# Patient Record
Sex: Male | Born: 1950 | Race: White | Hispanic: No | Marital: Single | Smoking: Current every day smoker
Health system: Southern US, Community
[De-identification: ages and names within clinical notes are randomized; demographics above are authoritative.]

## PROBLEM LIST (undated history)

## (undated) ENCOUNTER — Emergency Department (HOSPITAL_COMMUNITY): Admission: EM | Payer: Medicare HMO

## (undated) DIAGNOSIS — I509 Heart failure, unspecified: Secondary | ICD-10-CM

## (undated) DIAGNOSIS — I10 Essential (primary) hypertension: Secondary | ICD-10-CM

## (undated) DIAGNOSIS — I4891 Unspecified atrial fibrillation: Secondary | ICD-10-CM

## (undated) DIAGNOSIS — E119 Type 2 diabetes mellitus without complications: Secondary | ICD-10-CM

## (undated) HISTORY — PX: SHOULDER SURGERY: SHX246

## (undated) HISTORY — PX: CHOLECYSTECTOMY: SHX55

---

## 2018-10-28 DIAGNOSIS — I509 Heart failure, unspecified: Secondary | ICD-10-CM

## 2018-10-28 DIAGNOSIS — R079 Chest pain, unspecified: Secondary | ICD-10-CM

## 2018-10-28 DIAGNOSIS — I340 Nonrheumatic mitral (valve) insufficiency: Secondary | ICD-10-CM

## 2018-10-28 DIAGNOSIS — E1169 Type 2 diabetes mellitus with other specified complication: Secondary | ICD-10-CM

## 2018-10-28 DIAGNOSIS — I4891 Unspecified atrial fibrillation: Secondary | ICD-10-CM

## 2018-10-28 DIAGNOSIS — J441 Chronic obstructive pulmonary disease with (acute) exacerbation: Secondary | ICD-10-CM

## 2018-10-28 DIAGNOSIS — R0600 Dyspnea, unspecified: Secondary | ICD-10-CM

## 2018-10-28 DIAGNOSIS — N183 Chronic kidney disease, stage 3 (moderate): Secondary | ICD-10-CM

## 2018-10-28 DIAGNOSIS — I352 Nonrheumatic aortic (valve) stenosis with insufficiency: Secondary | ICD-10-CM | POA: Diagnosis not present

## 2018-10-28 DIAGNOSIS — I361 Nonrheumatic tricuspid (valve) insufficiency: Secondary | ICD-10-CM

## 2018-10-29 DIAGNOSIS — R0600 Dyspnea, unspecified: Secondary | ICD-10-CM | POA: Diagnosis not present

## 2018-10-29 DIAGNOSIS — J441 Chronic obstructive pulmonary disease with (acute) exacerbation: Secondary | ICD-10-CM | POA: Diagnosis not present

## 2018-10-29 DIAGNOSIS — I509 Heart failure, unspecified: Secondary | ICD-10-CM | POA: Diagnosis not present

## 2018-10-29 DIAGNOSIS — R079 Chest pain, unspecified: Secondary | ICD-10-CM

## 2018-10-30 DIAGNOSIS — I509 Heart failure, unspecified: Secondary | ICD-10-CM | POA: Diagnosis not present

## 2018-10-30 DIAGNOSIS — J441 Chronic obstructive pulmonary disease with (acute) exacerbation: Secondary | ICD-10-CM | POA: Diagnosis not present

## 2018-10-30 DIAGNOSIS — R0600 Dyspnea, unspecified: Secondary | ICD-10-CM | POA: Diagnosis not present

## 2018-10-30 DIAGNOSIS — R079 Chest pain, unspecified: Secondary | ICD-10-CM | POA: Diagnosis not present

## 2018-10-31 DIAGNOSIS — J441 Chronic obstructive pulmonary disease with (acute) exacerbation: Secondary | ICD-10-CM | POA: Diagnosis not present

## 2018-10-31 DIAGNOSIS — R0600 Dyspnea, unspecified: Secondary | ICD-10-CM | POA: Diagnosis not present

## 2018-10-31 DIAGNOSIS — R079 Chest pain, unspecified: Secondary | ICD-10-CM | POA: Diagnosis not present

## 2018-10-31 DIAGNOSIS — I509 Heart failure, unspecified: Secondary | ICD-10-CM | POA: Diagnosis not present

## 2019-03-25 DIAGNOSIS — I10 Essential (primary) hypertension: Secondary | ICD-10-CM

## 2019-03-25 DIAGNOSIS — I5033 Acute on chronic diastolic (congestive) heart failure: Secondary | ICD-10-CM

## 2019-03-25 DIAGNOSIS — R0602 Shortness of breath: Secondary | ICD-10-CM

## 2019-03-25 DIAGNOSIS — J9601 Acute respiratory failure with hypoxia: Secondary | ICD-10-CM

## 2019-03-25 DIAGNOSIS — I272 Pulmonary hypertension, unspecified: Secondary | ICD-10-CM

## 2019-03-25 DIAGNOSIS — J441 Chronic obstructive pulmonary disease with (acute) exacerbation: Secondary | ICD-10-CM

## 2019-03-25 DIAGNOSIS — N179 Acute kidney failure, unspecified: Secondary | ICD-10-CM

## 2019-03-26 DIAGNOSIS — J441 Chronic obstructive pulmonary disease with (acute) exacerbation: Secondary | ICD-10-CM | POA: Diagnosis not present

## 2019-03-26 DIAGNOSIS — I509 Heart failure, unspecified: Secondary | ICD-10-CM | POA: Diagnosis not present

## 2019-03-26 DIAGNOSIS — I4891 Unspecified atrial fibrillation: Secondary | ICD-10-CM | POA: Diagnosis not present

## 2019-03-26 DIAGNOSIS — I10 Essential (primary) hypertension: Secondary | ICD-10-CM | POA: Diagnosis not present

## 2019-03-26 DIAGNOSIS — R0602 Shortness of breath: Secondary | ICD-10-CM | POA: Diagnosis not present

## 2019-03-26 DIAGNOSIS — J9601 Acute respiratory failure with hypoxia: Secondary | ICD-10-CM | POA: Diagnosis not present

## 2019-03-26 DIAGNOSIS — D649 Anemia, unspecified: Secondary | ICD-10-CM

## 2019-03-27 DIAGNOSIS — I361 Nonrheumatic tricuspid (valve) insufficiency: Secondary | ICD-10-CM

## 2019-03-27 DIAGNOSIS — I10 Essential (primary) hypertension: Secondary | ICD-10-CM | POA: Diagnosis not present

## 2019-03-27 DIAGNOSIS — I4891 Unspecified atrial fibrillation: Secondary | ICD-10-CM | POA: Diagnosis not present

## 2019-03-27 DIAGNOSIS — R0602 Shortness of breath: Secondary | ICD-10-CM | POA: Diagnosis not present

## 2019-03-27 DIAGNOSIS — I352 Nonrheumatic aortic (valve) stenosis with insufficiency: Secondary | ICD-10-CM | POA: Diagnosis not present

## 2019-03-27 DIAGNOSIS — J9601 Acute respiratory failure with hypoxia: Secondary | ICD-10-CM | POA: Diagnosis not present

## 2019-03-27 DIAGNOSIS — J441 Chronic obstructive pulmonary disease with (acute) exacerbation: Secondary | ICD-10-CM | POA: Diagnosis not present

## 2019-03-27 DIAGNOSIS — I272 Pulmonary hypertension, unspecified: Secondary | ICD-10-CM

## 2019-03-27 DIAGNOSIS — I350 Nonrheumatic aortic (valve) stenosis: Secondary | ICD-10-CM | POA: Diagnosis not present

## 2019-03-27 DIAGNOSIS — I340 Nonrheumatic mitral (valve) insufficiency: Secondary | ICD-10-CM | POA: Diagnosis not present

## 2019-03-27 DIAGNOSIS — E1169 Type 2 diabetes mellitus with other specified complication: Secondary | ICD-10-CM

## 2019-03-27 DIAGNOSIS — I5030 Unspecified diastolic (congestive) heart failure: Secondary | ICD-10-CM

## 2019-03-28 DIAGNOSIS — J9601 Acute respiratory failure with hypoxia: Secondary | ICD-10-CM | POA: Diagnosis not present

## 2019-03-28 DIAGNOSIS — J441 Chronic obstructive pulmonary disease with (acute) exacerbation: Secondary | ICD-10-CM | POA: Diagnosis not present

## 2019-03-28 DIAGNOSIS — I10 Essential (primary) hypertension: Secondary | ICD-10-CM | POA: Diagnosis not present

## 2019-03-28 DIAGNOSIS — I4891 Unspecified atrial fibrillation: Secondary | ICD-10-CM | POA: Diagnosis not present

## 2019-03-28 DIAGNOSIS — I272 Pulmonary hypertension, unspecified: Secondary | ICD-10-CM | POA: Diagnosis not present

## 2019-03-28 DIAGNOSIS — I5030 Unspecified diastolic (congestive) heart failure: Secondary | ICD-10-CM | POA: Diagnosis not present

## 2019-03-28 DIAGNOSIS — R0602 Shortness of breath: Secondary | ICD-10-CM | POA: Diagnosis not present

## 2019-03-28 DIAGNOSIS — I350 Nonrheumatic aortic (valve) stenosis: Secondary | ICD-10-CM | POA: Diagnosis not present

## 2019-09-16 ENCOUNTER — Emergency Department (HOSPITAL_COMMUNITY): Payer: Medicare HMO

## 2019-09-16 ENCOUNTER — Other Ambulatory Visit: Payer: Self-pay

## 2019-09-16 ENCOUNTER — Encounter (HOSPITAL_COMMUNITY): Payer: Self-pay

## 2019-09-16 ENCOUNTER — Inpatient Hospital Stay (HOSPITAL_COMMUNITY)
Admission: EM | Admit: 2019-09-16 | Discharge: 2019-09-28 | DRG: 242 | Disposition: A | Discharge status: Home Health | Payer: Medicare HMO | Attending: Internal Medicine | Admitting: Internal Medicine

## 2019-09-16 ENCOUNTER — Inpatient Hospital Stay (HOSPITAL_COMMUNITY): Payer: Medicare HMO

## 2019-09-16 DIAGNOSIS — S81012A Laceration without foreign body, left knee, initial encounter: Secondary | ICD-10-CM

## 2019-09-16 DIAGNOSIS — Z950 Presence of cardiac pacemaker: Secondary | ICD-10-CM

## 2019-09-16 DIAGNOSIS — I5022 Chronic systolic (congestive) heart failure: Secondary | ICD-10-CM

## 2019-09-16 DIAGNOSIS — I480 Paroxysmal atrial fibrillation: Secondary | ICD-10-CM | POA: Diagnosis present

## 2019-09-16 DIAGNOSIS — I350 Nonrheumatic aortic (valve) stenosis: Secondary | ICD-10-CM

## 2019-09-16 DIAGNOSIS — R9431 Abnormal electrocardiogram [ECG] [EKG]: Secondary | ICD-10-CM

## 2019-09-16 DIAGNOSIS — W19XXXA Unspecified fall, initial encounter: Secondary | ICD-10-CM

## 2019-09-16 DIAGNOSIS — J90 Pleural effusion, not elsewhere classified: Secondary | ICD-10-CM

## 2019-09-16 DIAGNOSIS — N179 Acute kidney failure, unspecified: Secondary | ICD-10-CM

## 2019-09-16 DIAGNOSIS — I2510 Atherosclerotic heart disease of native coronary artery without angina pectoris: Secondary | ICD-10-CM

## 2019-09-16 DIAGNOSIS — Z955 Presence of coronary angioplasty implant and graft: Secondary | ICD-10-CM

## 2019-09-16 DIAGNOSIS — J441 Chronic obstructive pulmonary disease with (acute) exacerbation: Secondary | ICD-10-CM

## 2019-09-16 DIAGNOSIS — R001 Bradycardia, unspecified: Principal | ICD-10-CM | POA: Diagnosis present

## 2019-09-16 DIAGNOSIS — Z959 Presence of cardiac and vascular implant and graft, unspecified: Secondary | ICD-10-CM

## 2019-09-16 DIAGNOSIS — D696 Thrombocytopenia, unspecified: Secondary | ICD-10-CM | POA: Diagnosis present

## 2019-09-16 DIAGNOSIS — D62 Acute posthemorrhagic anemia: Secondary | ICD-10-CM | POA: Diagnosis present

## 2019-09-16 DIAGNOSIS — I4892 Unspecified atrial flutter: Secondary | ICD-10-CM | POA: Diagnosis present

## 2019-09-16 DIAGNOSIS — Z7901 Long term (current) use of anticoagulants: Secondary | ICD-10-CM

## 2019-09-16 DIAGNOSIS — Z23 Encounter for immunization: Secondary | ICD-10-CM

## 2019-09-16 DIAGNOSIS — Y9301 Activity, walking, marching and hiking: Secondary | ICD-10-CM | POA: Diagnosis present

## 2019-09-16 DIAGNOSIS — I4510 Unspecified right bundle-branch block: Secondary | ICD-10-CM | POA: Diagnosis present

## 2019-09-16 DIAGNOSIS — I4819 Other persistent atrial fibrillation: Secondary | ICD-10-CM | POA: Diagnosis not present

## 2019-09-16 DIAGNOSIS — Z7902 Long term (current) use of antithrombotics/antiplatelets: Secondary | ICD-10-CM

## 2019-09-16 DIAGNOSIS — Z20822 Contact with and (suspected) exposure to covid-19: Secondary | ICD-10-CM | POA: Diagnosis present

## 2019-09-16 DIAGNOSIS — Z79899 Other long term (current) drug therapy: Secondary | ICD-10-CM

## 2019-09-16 DIAGNOSIS — E1122 Type 2 diabetes mellitus with diabetic chronic kidney disease: Secondary | ICD-10-CM | POA: Diagnosis present

## 2019-09-16 DIAGNOSIS — I5043 Acute on chronic combined systolic (congestive) and diastolic (congestive) heart failure: Secondary | ICD-10-CM | POA: Diagnosis present

## 2019-09-16 DIAGNOSIS — S0081XA Abrasion of other part of head, initial encounter: Secondary | ICD-10-CM | POA: Diagnosis present

## 2019-09-16 DIAGNOSIS — E861 Hypovolemia: Secondary | ICD-10-CM | POA: Diagnosis present

## 2019-09-16 DIAGNOSIS — R011 Cardiac murmur, unspecified: Secondary | ICD-10-CM | POA: Diagnosis present

## 2019-09-16 DIAGNOSIS — Z8249 Family history of ischemic heart disease and other diseases of the circulatory system: Secondary | ICD-10-CM

## 2019-09-16 DIAGNOSIS — Z882 Allergy status to sulfonamides status: Secondary | ICD-10-CM

## 2019-09-16 DIAGNOSIS — I509 Heart failure, unspecified: Secondary | ICD-10-CM | POA: Diagnosis not present

## 2019-09-16 DIAGNOSIS — R062 Wheezing: Secondary | ICD-10-CM | POA: Diagnosis not present

## 2019-09-16 DIAGNOSIS — I130 Hypertensive heart and chronic kidney disease with heart failure and stage 1 through stage 4 chronic kidney disease, or unspecified chronic kidney disease: Secondary | ICD-10-CM | POA: Diagnosis present

## 2019-09-16 DIAGNOSIS — M25462 Effusion, left knee: Secondary | ICD-10-CM | POA: Diagnosis present

## 2019-09-16 DIAGNOSIS — I083 Combined rheumatic disorders of mitral, aortic and tricuspid valves: Secondary | ICD-10-CM | POA: Diagnosis present

## 2019-09-16 DIAGNOSIS — S5012XA Contusion of left forearm, initial encounter: Secondary | ICD-10-CM | POA: Diagnosis present

## 2019-09-16 DIAGNOSIS — G4733 Obstructive sleep apnea (adult) (pediatric): Secondary | ICD-10-CM | POA: Diagnosis present

## 2019-09-16 DIAGNOSIS — I4891 Unspecified atrial fibrillation: Secondary | ICD-10-CM | POA: Diagnosis not present

## 2019-09-16 DIAGNOSIS — Z6829 Body mass index (BMI) 29.0-29.9, adult: Secondary | ICD-10-CM

## 2019-09-16 DIAGNOSIS — S40012A Contusion of left shoulder, initial encounter: Secondary | ICD-10-CM | POA: Diagnosis present

## 2019-09-16 DIAGNOSIS — W010XXA Fall on same level from slipping, tripping and stumbling without subsequent striking against object, initial encounter: Secondary | ICD-10-CM | POA: Diagnosis present

## 2019-09-16 DIAGNOSIS — I495 Sick sinus syndrome: Secondary | ICD-10-CM | POA: Diagnosis present

## 2019-09-16 DIAGNOSIS — Z9181 History of falling: Secondary | ICD-10-CM

## 2019-09-16 DIAGNOSIS — N1832 Chronic kidney disease, stage 3b: Secondary | ICD-10-CM | POA: Diagnosis present

## 2019-09-16 DIAGNOSIS — R627 Adult failure to thrive: Secondary | ICD-10-CM | POA: Diagnosis present

## 2019-09-16 DIAGNOSIS — F17210 Nicotine dependence, cigarettes, uncomplicated: Secondary | ICD-10-CM | POA: Diagnosis present

## 2019-09-16 DIAGNOSIS — R42 Dizziness and giddiness: Secondary | ICD-10-CM | POA: Diagnosis present

## 2019-09-16 DIAGNOSIS — R55 Syncope and collapse: Secondary | ICD-10-CM | POA: Diagnosis not present

## 2019-09-16 DIAGNOSIS — E669 Obesity, unspecified: Secondary | ICD-10-CM | POA: Diagnosis present

## 2019-09-16 HISTORY — DX: Essential (primary) hypertension: I10

## 2019-09-16 HISTORY — DX: Heart failure, unspecified: I50.9

## 2019-09-16 HISTORY — DX: Unspecified atrial fibrillation: I48.91

## 2019-09-16 HISTORY — DX: Type 2 diabetes mellitus without complications: E11.9

## 2019-09-16 LAB — COMPREHENSIVE METABOLIC PANEL
ALT: 15 U/L (ref 0–44)
AST: 18 U/L (ref 15–41)
Albumin: 2.8 g/dL — ABNORMAL LOW (ref 3.5–5.0)
Alkaline Phosphatase: 110 U/L (ref 38–126)
Anion gap: 10 (ref 5–15)
BUN: 45 mg/dL — ABNORMAL HIGH (ref 8–23)
CO2: 24 mmol/L (ref 22–32)
Calcium: 8.9 mg/dL (ref 8.9–10.3)
Chloride: 103 mmol/L (ref 98–111)
Creatinine, Ser: 2.87 mg/dL — ABNORMAL HIGH (ref 0.61–1.24)
GFR calc Af Amer: 25 mL/min — ABNORMAL LOW (ref >60)
GFR calc non Af Amer: 22 mL/min — ABNORMAL LOW (ref >60)
Glucose, Bld: 134 mg/dL — ABNORMAL HIGH (ref 70–99)
Potassium: 4.7 mmol/L (ref 3.5–5.1)
Sodium: 137 mmol/L (ref 135–145)
Total Bilirubin: 1.1 mg/dL (ref 0.3–1.2)
Total Protein: 5.7 g/dL — ABNORMAL LOW (ref 6.5–8.1)

## 2019-09-16 LAB — CBC
HCT: 29.4 % — ABNORMAL LOW (ref 39.0–52.0)
Hemoglobin: 9.1 g/dL — ABNORMAL LOW (ref 13.0–17.0)
MCH: 29.5 pg (ref 26.0–34.0)
MCHC: 31 g/dL (ref 30.0–36.0)
MCV: 95.5 fL (ref 80.0–100.0)
Platelets: 120 10*3/uL — ABNORMAL LOW (ref 150–400)
RBC: 3.08 MIL/uL — ABNORMAL LOW (ref 4.22–5.81)
RDW: 22.5 % — ABNORMAL HIGH (ref 11.5–15.5)
WBC: 8.3 10*3/uL (ref 4.0–10.5)
nRBC: 0.4 % — ABNORMAL HIGH (ref 0.0–0.2)

## 2019-09-16 LAB — I-STAT CHEM 8, ED
BUN: 42 mg/dL — ABNORMAL HIGH (ref 8–23)
Calcium, Ion: 1.1 mmol/L — ABNORMAL LOW (ref 1.15–1.40)
Chloride: 104 mmol/L (ref 98–111)
Creatinine, Ser: 3 mg/dL — ABNORMAL HIGH (ref 0.61–1.24)
Glucose, Bld: 122 mg/dL — ABNORMAL HIGH (ref 70–99)
HCT: 29 % — ABNORMAL LOW (ref 39.0–52.0)
Hemoglobin: 9.9 g/dL — ABNORMAL LOW (ref 13.0–17.0)
Potassium: 4.4 mmol/L (ref 3.5–5.1)
Sodium: 136 mmol/L (ref 135–145)
TCO2: 28 mmol/L (ref 22–32)

## 2019-09-16 LAB — RESPIRATORY PANEL BY RT PCR (FLU A&B, COVID)
Influenza A by PCR: NEGATIVE
Influenza B by PCR: NEGATIVE
SARS Coronavirus 2 by RT PCR: NEGATIVE

## 2019-09-16 LAB — PROTIME-INR
INR: 2.4 — ABNORMAL HIGH (ref 0.8–1.2)
Prothrombin Time: 26.5 seconds — ABNORMAL HIGH (ref 11.4–15.2)

## 2019-09-16 LAB — LACTIC ACID, PLASMA: Lactic Acid, Venous: 1.4 mmol/L (ref 0.5–1.9)

## 2019-09-16 LAB — SAMPLE TO BLOOD BANK

## 2019-09-16 LAB — ETHANOL: Alcohol, Ethyl (B): <10 mg/dL (ref <10)

## 2019-09-16 MED ORDER — FENTANYL CITRATE (PF) 100 MCG/2ML IJ SOLN
50.0000 ug | Freq: Once | INTRAMUSCULAR | Status: AC
  Administered 2019-09-16: 50 ug via INTRAVENOUS
  Filled 2019-09-16: qty 2

## 2019-09-16 MED ORDER — LIDOCAINE-EPINEPHRINE (PF) 2 %-1:200000 IJ SOLN
20.0000 mL | Freq: Once | INTRAMUSCULAR | Status: AC
  Administered 2019-09-16: 20 mL
  Filled 2019-09-16: qty 20

## 2019-09-16 MED ORDER — SODIUM CHLORIDE 0.9 % IV BOLUS
1000.0000 mL | Freq: Once | INTRAVENOUS | Status: AC
  Administered 2019-09-16: 1000 mL via INTRAVENOUS

## 2019-09-16 MED ORDER — TETANUS-DIPHTH-ACELL PERTUSSIS 5-2.5-18.5 LF-MCG/0.5 IM SUSP
0.5000 mL | Freq: Once | INTRAMUSCULAR | Status: AC
  Administered 2019-09-16: 0.5 mL via INTRAMUSCULAR

## 2019-09-16 MED ORDER — CEFAZOLIN SODIUM-DEXTROSE 2-4 GM/100ML-% IV SOLN
2.0000 g | Freq: Once | INTRAVENOUS | Status: AC
  Administered 2019-09-16: 2 g via INTRAVENOUS

## 2019-09-16 MED ORDER — INSULIN ASPART 100 UNIT/ML ~~LOC~~ SOLN: 0.0000 [IU] | Freq: Every day | SUBCUTANEOUS | Status: DC

## 2019-09-16 MED ORDER — ACETAMINOPHEN 650 MG RE SUPP: 650.0000 mg | Freq: Four times a day (QID) | RECTAL | Status: DC | PRN

## 2019-09-16 MED ORDER — CEFAZOLIN SODIUM-DEXTROSE 2-4 GM/100ML-% IV SOLN
2.0000 g | Freq: Two times a day (BID) | INTRAVENOUS | Status: DC
  Administered 2019-09-17 – 2019-09-22 (×11): 2 g via INTRAVENOUS
  Filled 2019-09-16 (×12): qty 100

## 2019-09-16 MED ORDER — ACETAMINOPHEN 325 MG PO TABS
650.0000 mg | ORAL_TABLET | Freq: Four times a day (QID) | ORAL | Status: DC | PRN
  Administered 2019-09-22 – 2019-09-23 (×2): 650 mg via ORAL
  Filled 2019-09-16 (×2): qty 2

## 2019-09-16 MED ORDER — LIDOCAINE-EPINEPHRINE (PF) 2 %-1:200000 IJ SOLN
INTRAMUSCULAR | Status: AC
  Administered 2019-09-16: 20 mL via INTRADERMAL
  Filled 2019-09-16: qty 20

## 2019-09-16 MED ORDER — LIDOCAINE-EPINEPHRINE (PF) 2 %-1:200000 IJ SOLN: 20.0000 mL | Freq: Once | INTRAMUSCULAR | Status: AC

## 2019-09-16 MED ORDER — INSULIN ASPART 100 UNIT/ML ~~LOC~~ SOLN
0.0000 [IU] | Freq: Three times a day (TID) | SUBCUTANEOUS | Status: DC
  Administered 2019-09-18 (×2): 2 [IU] via SUBCUTANEOUS
  Administered 2019-09-18 – 2019-09-19 (×2): 1 [IU] via SUBCUTANEOUS
  Administered 2019-09-19 (×2): 2 [IU] via SUBCUTANEOUS
  Administered 2019-09-20 (×2): 1 [IU] via SUBCUTANEOUS
  Administered 2019-09-20 – 2019-09-21 (×2): 2 [IU] via SUBCUTANEOUS
  Administered 2019-09-21: 1 [IU] via SUBCUTANEOUS
  Administered 2019-09-21: 2 [IU] via SUBCUTANEOUS
  Administered 2019-09-22: 1 [IU] via SUBCUTANEOUS
  Administered 2019-09-22: 2 [IU] via SUBCUTANEOUS
  Administered 2019-09-23 – 2019-09-24 (×4): 1 [IU] via SUBCUTANEOUS
  Administered 2019-09-25: 2 [IU] via SUBCUTANEOUS
  Administered 2019-09-25: 1 [IU] via SUBCUTANEOUS
  Administered 2019-09-26 – 2019-09-27 (×2): 2 [IU] via SUBCUTANEOUS
  Administered 2019-09-28 (×2): 1 [IU] via SUBCUTANEOUS

## 2019-09-16 NOTE — ED Notes (Signed)
Pt's HR dropping to mid to high 30s on monitor. Pt states HR normally in mid 40s because he needs his heart valves replaced.  Pt also c/o increased pain to L knee. Dr. Darl Householder notified.

## 2019-09-16 NOTE — ED Notes (Signed)
Admitting MD at bedside.

## 2019-09-16 NOTE — ED Notes (Signed)
Update son, [name], when pt gets room. [phone]

## 2019-09-16 NOTE — ED Notes (Signed)
Dr. Darl Householder at bedside to suture L knee

## 2019-09-16 NOTE — ED Notes (Signed)
Pt back from CT

## 2019-09-16 NOTE — Progress Notes (Signed)
Pharmacy Antibiotic Note  Justin Boone is a 69 y.o. male admitted on 09/16/2019 with cellulitis - s/p fall.  Pharmacy has been consulted for Cefazolin dosing.  Plan: Ancef 2gm IV q12h Will f/u renal function, micro data, and pt's clinical condition  Height: 6' (182.9 cm) Weight: 86.2 kg (190 lb) IBW/kg (Calculated) : 77.6  Temp (24hrs), Avg:98.5 F (36.9 C), Min:98.5 F (36.9 C), Max:98.5 F (36.9 C)  Recent Labs  Lab 09/16/19 1817 09/16/19 1820 09/16/19 1826  WBC 8.3  --   --   CREATININE 2.87* 3.00*  --   LATICACIDVEN  --   --  1.4    Estimated Creatinine Clearance: 26.2 mL/min (A) (by C-G formula based on SCr of 3 mg/dL (H)).    Allergies  Allergen Reactions  . Sulfa Antibiotics     Antimicrobials this admission: 4/8 Ancef >>    Microbiology results: Pending  Thank you for allowing pharmacy to be a part of this patient's care.  Sherlon Handing, PharmD, BCPS Please see amion for complete clinical pharmacist phone list 09/16/2019 11:52 PM

## 2019-09-16 NOTE — ED Notes (Signed)
Assumed care of pt

## 2019-09-16 NOTE — ED Notes (Signed)
Repeat EKG completed

## 2019-09-16 NOTE — ED Provider Notes (Signed)
[facility] EMERGENCY DEPARTMENT Provider Note   CSN: [id number] Arrival date & time: 09/16/19  1810     History Chief Complaint  Patient presents with  . Fall    Justin Boone is a 69 y.o. male hx of afib on eliquis, CHF, here presenting with fall.  Patient states he felt lightheaded and dizzy earlier today and fell and injured the left forearm.  He states that he was able to get up afterwards and felt lightheaded dizzy again and fell and had a direct trauma to the left knee.  He also hit his head as well.  He is complaining of left shoulder pain as well.  Patient is on Eliquis for A. fib.  Denies any chest pain or abdominal pain.  EMS got there and had to place a tourniquet on the left knee because of bleeding.  The history is provided by the patient.       Past Medical History:  Diagnosis Date  . A-fib ([place])   . CHF (congestive heart failure) [facility])     Patient Active Problem List   Diagnosis Date Noted  . A-fib ([place])     No family history on file.  Social History   Tobacco Use  . Smoking status: Not on file  Substance Use Topics  . Alcohol use: Not on file  . Drug use: Not on file    Home Medications Prior to Admission medications   Not on File    Allergies    Sulfa antibiotics  Review of Systems   Review of Systems  Musculoskeletal: Positive for back pain.       L knee pain   Skin: Positive for wound.  All other systems reviewed and are negative.   Physical Exam Updated Vital Signs BP 110/74   Pulse (!) 51   Temp 98.5 F (36.9 C) (Oral)   Resp 19   Ht 6' (1.829 m)   Wt 86.2 kg   SpO2 95%   BMI 25.77 kg/m   Physical Exam Vitals and nursing note reviewed.  Constitutional:      Comments: Uncomfortable   HENT:     Head:     Comments: Abrasion of the forehead    Nose: Nose normal.     Mouth/Throat:     Mouth: Mucous membranes are moist.  Eyes:     Extraocular Movements: Extraocular movements intact.     Pupils: Pupils  are equal, round, and reactive to light.  Cardiovascular:     Rate and Rhythm: Normal rate and regular rhythm.     Pulses: Normal pulses.     Heart sounds: Normal heart sounds.  Pulmonary:     Effort: Pulmonary effort is normal.     Breath sounds: Normal breath sounds.  Abdominal:     General: Abdomen is flat.  Musculoskeletal:     Cervical back: Normal range of motion.     Comments: 20 cm left knee laceration with active bleeding.  Obvious left knee effusion and unable to range the knee.  Bruising of the left shoulder and left forearm. Pelvis stable, no midline spinal tenderness   Skin:    General: Skin is warm.     Capillary Refill: Capillary refill takes less than 2 seconds.  Neurological:     General: No focal deficit present.     Mental Status: He is oriented to person, place, and time.  Psychiatric:        Mood and Affect: Mood normal.  Behavior: Behavior normal.       ED Results / Procedures / Treatments   Labs (all labs ordered are listed, but only abnormal results are displayed) Labs Reviewed  COMPREHENSIVE METABOLIC PANEL - Abnormal; Notable for the following components:      Result Value   Glucose, Bld 134 (*)    BUN 45 (*)    Creatinine, Ser 2.87 (*)    Total Protein 5.7 (*)    Albumin 2.8 (*)    GFR calc non Af Amer 22 (*)    GFR calc Af Amer 25 (*)    All other components within normal limits  CBC - Abnormal; Notable for the following components:   RBC 3.08 (*)    Hemoglobin 9.1 (*)    HCT 29.4 (*)    RDW 22.5 (*)    Platelets 120 (*)    nRBC 0.4 (*)    All other components within normal limits  PROTIME-INR - Abnormal; Notable for the following components:   Prothrombin Time 26.5 (*)    INR 2.4 (*)    All other components within normal limits  I-STAT CHEM 8, ED - Abnormal; Notable for the following components:   BUN 42 (*)    Creatinine, Ser 3.00 (*)    Glucose, Bld 122 (*)    Calcium, Ion 1.10 (*)    Hemoglobin 9.9 (*)    HCT 29.0 (*)     All other components within normal limits  RESPIRATORY PANEL BY RT PCR (FLU A&B, COVID)  ETHANOL  LACTIC ACID, PLASMA  URINALYSIS, ROUTINE W REFLEX MICROSCOPIC  SAMPLE TO BLOOD BANK    EKG EKG Interpretation  Date/Time:  Thursday September 16 2019 20:31:14 EDT Ventricular Rate:  52 PR Interval:    QRS Duration: 159 QT Interval:  546 QTC Calculation: 508 R Axis:   158 Text Interpretation: Sinus or ectopic atrial bradycardia Sinus pause Right bundle branch block No significant change since last tracing Confirmed by Wandra Arthurs [phone]) on 09/16/2019 8:58:32 PM   Radiology DG Forearm Left  Result Date: 09/16/2019 CLINICAL DATA:  Status post fall. EXAM: LEFT FOREARM - 2 VIEW COMPARISON:  None. FINDINGS: There is no evidence of fracture or other focal bone lesions. Soft tissues are unremarkable. IMPRESSION: Negative. Electronically Signed   By: Virgina Norfolk M.D.   On: 09/16/2019 19:08   CT Head Wo Contrast  Result Date: 09/16/2019 CLINICAL DATA:  70 year old male with multiple falls today. Positive loss of consciousness. EXAM: CT HEAD WITHOUT CONTRAST TECHNIQUE: Contiguous axial images were obtained from the base of the skull through the vertex without intravenous contrast. COMPARISON:  [facility] 06/15/2007. FINDINGS: Brain: Cerebral volume is within normal limits for age. No midline shift, ventriculomegaly, mass effect, evidence of mass lesion, intracranial hemorrhage or evidence of cortically based acute infarction. Patchy and confluent bilateral cerebral white matter hypodensity is new since 2009, although fairly symmetric. The deep gray nuclei, brainstem and cerebellum remain within normal limits. No cortical encephalomalacia identified. Vascular: Calcified atherosclerosis at the skull base.No suspicious intracranial vascular hyperdensity. Skull: Intact, negative. Sinuses/Orbits: Visualized paranasal sinuses and mastoids are clear. Other: No acute orbit or scalp soft tissue  findings. Postoperative changes to both globes. IMPRESSION: 1. No acute intracranial abnormality or acute traumatic injury identified. 2. 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 Electronically Signed   By: Genevie Ann M.D.   On: 09/16/2019 19:32   CT Cervical Spine  Wo Contrast  Result Date: 09/16/2019 CLINICAL DATA:  69 year old male with multiple falls today. Positive loss of consciousness. EXAM: CT CERVICAL SPINE WITHOUT CONTRAST TECHNIQUE: Multidetector CT imaging of the cervical spine was performed without intravenous contrast. Multiplanar CT image reconstructions were also generated. COMPARISON:  Head CT today reported separately. [facility] cervical spine MRI 07/19/2014. Cervical spine radiographs 02/13/2018. FINDINGS: Alignment: Chronic straightening of cervical lordosis similar to the prior MRI. Mild chronic retrolisthesis at C6-C7. Bilateral posterior element alignment is within normal limits. Skull base and vertebrae: Motion artifact C5 through C7. Visualized skull base is intact. No atlanto-occipital dissociation. C1 and C2 appear intact and normally aligned. No acute osseous abnormality identified. Soft tissues and spinal canal: No prevertebral fluid or swelling. No visible canal hematoma. Bilateral calcified carotid atherosclerosis in the neck. Disc levels: Cervical spine degeneration appears progressed since the 2016 MRI, likely with new or increased upper cervical spine mild degenerative stenosis such as at C2-C3 and C3-C4 (sagittal image 55). Lower cervical spinal stenosis to severe now. At C6-C7 could be moderate Upper chest: Low-density at least moderate size layering pleural effusions in both lung apices. No pneumothorax. Visible upper thoracic levels appear grossly intact. Calcified aortic atherosclerosis. Other: Carious dentition. IMPRESSION: 1. No acute traumatic injury identified in the cervical spine. Mild  motion artifact C5 through C7. 2. 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 3. Bilateral layering pleural effusions visible in the lung apices, at least moderate. 4. Aortic Atherosclerosis (ICD10-I70.0). 5. Carious dentition. Electronically Signed   By: Genevie Ann M.D.   On: 09/16/2019 19:38   CT Knee Left Wo Contrast  Result Date: 09/16/2019 CLINICAL DATA:  Status post fall, question of tibial plateau fracture EXAM: CT OF THE left  KNEE WITHOUT CONTRAST TECHNIQUE: Multidetector CT imaging of the left knee was performed according to the standard protocol. Multiplanar CT image reconstructions were also generated. COMPARISON:  None. FINDINGS: Bones/Joint/Cartilage No definite fracture or dislocation is seen. 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 A tiny amount of suprapatellar fluid is seen. Ligaments Suboptimally assessed by CT. There does appear to be however thickening and edema seen around the lateral patellar retinaculum. Muscles and Tendons The muscles appear to be grossly intact. No focal atrophy or tear. The patellar and quadriceps tendon are intact. Soft tissues Significant overlying subcutaneous edema soft tissue swelling and small foci of subcutaneous emphysema are noted. Along the lateral patellar retinaculum there is a approximately 3 cm soft tissue hematoma. And along the proximal medial tibia there is a small 2 cm soft tissue hematoma. There is also overlying skin laceration seen. Scattered vascular calcifications are noted. IMPRESSION: No definite fracture seen. Edema and thickening seen around the lateral patellar retinaculum, likely due to intrasubstance sprain. Overlying prepatellar laceration with significant subcutaneous edema and small soft tissue hematomas as described above. Electronically Signed   By: Prudencio Pair M.D.   On: 09/16/2019  19:40   DG Pelvis Portable  Result Date: 09/16/2019 CLINICAL DATA:  Status post fall. EXAM: PORTABLE PELVIS 1-2 VIEWS COMPARISON:  None. FINDINGS: There is no evidence of an acute pelvic fracture or diastasis. Mild chronic changes are seen along the symphysis pubis. A total right hip replacement is noted. There is no evidence of surrounding lucency to suggest the presence of hardware loosening or infection. Soft tissue structures are unremarkable. IMPRESSION: Intact right hip replacement. Electronically Signed   By:  Virgina Norfolk M.D.   On: 09/16/2019 19:12   DG Chest Port 1 View  Result Date: 09/16/2019 CLINICAL DATA:  Status post fall with loss of consciousness. EXAM: PORTABLE CHEST 1 VIEW COMPARISON:  June 15, 2019 FINDINGS: Mild, stable diffusely increased lung markings are seen. Mild atelectasis and/or infiltrate is seen within the retrocardiac region of the left lung base. Small bilateral pleural effusions are noted. There is no evidence of a pneumothorax. The cardiac silhouette is mildly enlarged and unchanged in size. There is moderate severity calcification of the aortic arch. The visualized skeletal structures are unremarkable. IMPRESSION: 1. Mild left basilar atelectasis and/or infiltrate. 2. Small bilateral pleural effusions. Electronically Signed   By: Virgina Norfolk M.D.   On: 09/16/2019 19:07   DG Shoulder Left  Result Date: 09/16/2019 CLINICAL DATA:  Status post fall. EXAM: LEFT SHOULDER - 2+ VIEW COMPARISON:  None. FINDINGS: There is no evidence of acute fracture or dislocation. Moderate severity degenerative changes seen involving the distal left clavicle, left acromioclavicular joint and left acromion. Soft tissues are unremarkable. IMPRESSION: Degenerative changes, without evidence of acute osseous abnormality. Electronically Signed   By: Virgina Norfolk M.D.   On: 09/16/2019 19:14   DG Knee Left Port  Result Date: 09/16/2019 CLINICAL DATA:  Status post fall. EXAM: PORTABLE  LEFT KNEE - 1-2 VIEW COMPARISON:  None. FINDINGS: It should be noted that the medial and lateral epicondyles of the distal left femur are limited in evaluation on the frontal view secondary to the presence of an overlying radiopaque bandage. No evidence of acute fracture, dislocation, or joint effusion. No evidence of arthropathy or other focal bone abnormality. There is mild infrapatellar soft tissue swelling. A mild to moderate amount of soft tissue air is also seen within this region. IMPRESSION: 1. Mild to moderate amount of infrapatellar soft tissue swelling and soft tissue air without evidence of acute osseous abnormality. Electronically Signed   By: Virgina Norfolk M.D.   On: 09/16/2019 19:10   DG Tibia/Fibula Left Port  Result Date: 09/16/2019 CLINICAL DATA:  Fall EXAM: PORTABLE LEFT TIBIA AND FIBULA - 2 VIEW COMPARISON:  None. FINDINGS: Infrapatellar soft tissue swelling with soft tissue air again noted as seen on earlier knee series. No acute fracture, subluxation or dislocation. Old healed distal fibular shaft fracture. IMPRESSION: No acute bony abnormality. Electronically Signed   By: Rolm Baptise M.D.   On: 09/16/2019 19:32    Procedures Procedures (including critical care time)  LACERATION REPAIR Performed by: Wandra Arthurs Authorized by: Wandra Arthurs Consent: Verbal consent obtained. Risks and benefits: risks, benefits and alternatives were discussed Consent given by: patient Patient identity confirmed: provided demographic data Prepped and Draped in normal sterile fashion Wound explored  Laceration Location: L knee  Laceration Length:  20 cm  No Foreign Bodies seen or palpated, there is fat exposed  Anesthesia: local infiltration  Local anesthetic: lidocaine 2 % with epinephrine  Anesthetic total:30 ml  Irrigation method: syringe Amount of cleaning: standard  Skin closure:  Multi layer  Number of sutures: see below  Technique: Performed multilayer closure.  A  placed 4 3-0 Vicryl subcutaneous stitches to close the adipose tissue.  I then placed sixteen 3-0 Ethilon simple interrupted sutures to loosely approximate the wound.   Patient tolerance: Patient tolerated the procedure well with no immediate complications.   Medications Ordered in ED Medications  fentaNYL (SUBLIMAZE) injection 50 mcg (50 mcg Intravenous Given 09/16/19 1849)  sodium chloride 0.9 % bolus 1,000  mL (0 mLs Intravenous Stopped 09/16/19 1940)  Tdap (BOOSTRIX) injection 0.5 mL (0.5 mLs Intramuscular Given 09/16/19 1840)  ceFAZolin (ANCEF) IVPB 2g/100 mL premix (0 g Intravenous Stopped 09/16/19 1910)  fentaNYL (SUBLIMAZE) injection 50 mcg (50 mcg Intravenous Given 09/16/19 2019)  lidocaine-EPINEPHrine (XYLOCAINE W/EPI) 2 %-1:200000 (PF) injection 20 mL (20 mLs Infiltration Given by Other 09/16/19 2123)  lidocaine-EPINEPHrine (XYLOCAINE W/EPI) 2 %-1:200000 (PF) injection 20 mL (20 mLs Intradermal Given by Other 09/16/19 2122)    ED Course  I have reviewed the triage vital signs and the nursing notes.  Pertinent labs & imaging results that were available during my care of the patient were reviewed by me and considered in my medical decision making (see chart for details).    MDM Rules/Calculators/A&P                      Justin Boone is a 70 y.o. male here presenting with fall.  He is on Eliquis and did have a head injury and large laceration to the left knee.  Concern for open tibial plateau fracture.  He states that Dr. Tamala Julian operated on his right hip previously and he does want to stay in the practice.  Will check trauma labs as well as CT head and neck and x-rays.  If knee x-ray is negative will need CT knee.  10:20 PM Patient does go in and out of a junctional rhythm.  I talked to cardiologist Dr. Marletta Lor.  She states that patient is on Cardizem and metoprolol so recommend telemetry monitoring and medication adjustment.  His x-ray and CT scan showed no tibial plateau fracture. I talked to Dr.  Griffin Basil from orthopedics.  He recommend washing out the wound and keep patient on Keflex.  He also recommend loosely approximate the wound after watching him.  He states that patient can be discharged from a orthopedic standpoint.  However I told him that patient may need telemetry monitoring so he will see the patient in the morning.   Final Clinical Impression(s) / ED Diagnoses Final diagnoses:  Fall  Fall    Rx / DC Orders ED Discharge Orders    None       Drenda Freeze, MD 09/16/19 2220

## 2019-09-16 NOTE — ED Notes (Signed)
EKG completed.  Fentanyl given per MAR. Name/DOB verified with pt Covid swab collected, labeled with 2 pt identifiers, and brought to lab

## 2019-09-16 NOTE — ED Triage Notes (Signed)
Pt arrived with EMS, he fell twice at home today, It was on the second fall that he had positive LOC and sustained a laceration to the left thigh.

## 2019-09-16 NOTE — H&P (Signed)
History and Physical    Justin Boone OIN:[id number] DOB: [birth year] DOA: 09/16/2019  PCP: Patient, No Pcp Per Patient coming from: Home  Chief Complaint: Fall  HPI: Justin Boone is a 70 y.o. male with medical history significant of A. fib on Eliquis, CHF, diabetes, hypertension, COPD presenting to the ED for evaluation of head injury and a large laceration to the left knee after a fall.  Patient states he has been feeling very weak and dizzy this past week.  He has noticed that his heart "beats and then stops beating for a long time."  Today he fell twice and injured his left knee and left shoulder.  During the second fall he even hit his head and lost consciousness.  He takes metoprolol and Eliquis.  Not sure which other medications he takes as he does not have them with him at this time.  His cardiologist is outside of [place] and he cannot recall the name at this time.  States he has been told that he has problems with aorta and another heart valve.  Reports having chronic shortness of breath and cough secondary to COPD, no recent change.  Denies chest pain.  Denies fevers, nausea, vomiting, abdominal pain, or diarrhea.  ED Course: Afebrile.  Bradycardic with junctional rhythm.  Heart rate ranging from 30s to 50s.  Not hypotensive.  Labs show no leukocytosis.  Lactic acid normal.  Hemoglobin 9.1, MCV 95.  Platelet count 120.  BUN 45, creatinine 2.8.  No prior labs for comparison.  Blood ethanol level undetectable.  INR 2.4.  SARS-CoV-2 PCR test negative.  Chest x-ray showing mild left basilar atelectasis and/or infiltrate.  Small bilateral pleural effusions. X-ray of pelvis negative for hip fracture. X-ray of left forearm negative. X-ray of left knee with mild to moderate amount of infrapatellar soft tissue swelling and soft tissue air without evidence of acute osseous abnormality. X-ray of left tibia/fibula negative for acute bony abnormality. X-ray of left shoulder showing degenerative  changes without evidence of acute osseous abnormality. CT of left knee negative for fracture.  Showing edema and thickening around the lateral patellar retinaculum, likely due to intrasubstance sprain.  Overlying prepatellar laceration with significant subcutaneous edema and small soft tissue hematomas. CT head negative for acute intracranial abnormality or traumatic injury. CT C-spine negative for acute traumatic injury.  Showing chronic cervical spine degeneration which appears progressed since 2016 MRI with moderate to severe lower cervical spinal stenosis now possible at C6-C7.  Knee laceration was repaired in the ED.  Patient received Tdap booster.  Case was discussed with Dr. Griffin Basil who recommended placing the patient on cefazolin and orthopedic team will see the patient in the morning.  Patient received 1 L normal saline bolus.  Review of Systems:  All systems reviewed and apart from history of presenting illness, are negative.  Past Medical History:  Diagnosis Date  . A-fib ([place])   . CHF (congestive heart failure) ([place])     Past Surgical History:  Procedure Laterality Date  . SHOULDER SURGERY       reports that he has been smoking cigarettes. He has a 25.00 pack-year smoking history. He has never used smokeless tobacco. He reports that he does not drink alcohol or use drugs.  Allergies  Allergen Reactions  . Sulfa Antibiotics     History reviewed. No pertinent family history.  Prior to Admission medications   Not on File    Physical Exam: Vitals:   09/16/19 2300 09/16/19 2330 09/16/19 2345 09/17/19 0000  BP: 127/71 108/66 115/63 (!) 106/59  Pulse: (!) 51 (!) 44 (!) 42 (!) 41  Resp:  (!) 22 19 18   Temp:      TempSrc:      SpO2: 93% 94% 90% 96%  Weight:      Height:        Physical Exam  Constitutional: He is oriented to person, place, and time. He appears well-developed and well-nourished. No distress.  HENT:  Head: Normocephalic.  Eyes: Right eye exhibits  no discharge. Left eye exhibits no discharge.  Cardiovascular: Normal rate, regular rhythm and intact distal pulses.  Pulmonary/Chest: Effort normal. He has no rales.  Rhonchi  Abdominal: Soft. Bowel sounds are normal. He exhibits no distension. There is no abdominal tenderness. There is no guarding.  Musculoskeletal:        General: Edema present.     Cervical back: Neck supple.     Comments: +3 pitting edema of bilateral lower extremities Left knee: Bandage in place Left elbow abrasion Left shoulder abrasion Bruising noted on left shin  Neurological: He is alert and oriented to person, place, and time.  Skin: Skin is warm and dry. He is not diaphoretic.       Labs on Admission: I have personally reviewed following labs and imaging studies  CBC: Recent Labs  Lab 09/16/19 1817 09/16/19 1820  WBC 8.3  --   HGB 9.1* 9.9*  HCT 29.4* 29.0*  MCV 95.5  --   PLT 120*  --    Basic Metabolic Panel: Recent Labs  Lab 09/16/19 1817 09/16/19 1820  NA 137 136  K 4.7 4.4  CL 103 104  CO2 24  --   GLUCOSE 134* 122*  BUN 45* 42*  CREATININE 2.87* 3.00*  CALCIUM 8.9  --    GFR: Estimated Creatinine Clearance: 26.2 mL/min (A) (by C-G formula based on SCr of 3 mg/dL (H)). Liver Function Tests: Recent Labs  Lab 09/16/19 1817  AST 18  ALT 15  ALKPHOS 110  BILITOT 1.1  PROT 5.7*  ALBUMIN 2.8*   No results for input(s): LIPASE, AMYLASE in the last 168 hours. No results for input(s): AMMONIA in the last 168 hours. Coagulation Profile: Recent Labs  Lab 09/16/19 1817  INR 2.4*   Cardiac Enzymes: No results for input(s): CKTOTAL, CKMB, CKMBINDEX, TROPONINI in the last 168 hours. BNP (last 3 results) No results for input(s): PROBNP in the last 8760 hours. HbA1C: No results for input(s): HGBA1C in the last 72 hours. CBG: No results for input(s): GLUCAP in the last 168 hours. Lipid Profile: No results for input(s): CHOL, HDL, LDLCALC, TRIG, CHOLHDL, LDLDIRECT in the last  72 hours. Thyroid Function Tests: No results for input(s): TSH, T4TOTAL, FREET4, T3FREE, THYROIDAB in the last 72 hours. Anemia Panel: Recent Labs    09/16/19 2352  RETICCTPCT 1.6   Urine analysis:    Component Value Date/Time   COLORURINE YELLOW 09/16/2019 2352   APPEARANCEUR CLEAR 09/16/2019 2352   LABSPEC 1.010 09/16/2019 2352   PHURINE 5.0 09/16/2019 2352   GLUCOSEU NEGATIVE 09/16/2019 2352   HGBUR NEGATIVE 09/16/2019 2352   BILIRUBINUR NEGATIVE 09/16/2019 2352   KETONESUR NEGATIVE 09/16/2019 2352   PROTEINUR NEGATIVE 09/16/2019 2352   NITRITE NEGATIVE 09/16/2019 2352   LEUKOCYTESUR NEGATIVE 09/16/2019 2352    Radiological Exams on Admission: DG Forearm Left  Result Date: 09/16/2019 CLINICAL DATA:  Status post fall. EXAM: LEFT FOREARM - 2 VIEW COMPARISON:  None. FINDINGS: There is no evidence of fracture or  other focal bone lesions. Soft tissues are unremarkable. IMPRESSION: Negative. Electronically Signed   By: Virgina Norfolk M.D.   On: 09/16/2019 19:08   CT Head Wo Contrast  Result Date: 09/16/2019 CLINICAL DATA:  69 year old male with multiple falls today. Positive loss of consciousness. EXAM: CT HEAD WITHOUT CONTRAST TECHNIQUE: Contiguous axial images were obtained from the base of the skull through the vertex without intravenous contrast. COMPARISON:  [facility] 06/15/2007. FINDINGS: Brain: Cerebral volume is within normal limits for age. No midline shift, ventriculomegaly, mass effect, evidence of mass lesion, intracranial hemorrhage or evidence of cortically based acute infarction. Patchy and confluent bilateral cerebral white matter hypodensity is new since 2009, although fairly symmetric. The deep gray nuclei, brainstem and cerebellum remain within normal limits. No cortical encephalomalacia identified. Vascular: Calcified atherosclerosis at the skull base.No suspicious intracranial vascular hyperdensity. Skull: Intact, negative. Sinuses/Orbits: Visualized  paranasal sinuses and mastoids are clear. Other: No acute orbit or scalp soft tissue findings. Postoperative changes to both globes. IMPRESSION: 1. No acute intracranial abnormality or acute traumatic injury identified. 2. 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 Electronically Signed   By: Genevie Ann M.D.   On: 09/16/2019 19:32   CT Cervical Spine Wo Contrast  Result Date: 09/16/2019 CLINICAL DATA:  69 year old male with multiple falls today. Positive loss of consciousness. EXAM: CT CERVICAL SPINE WITHOUT CONTRAST TECHNIQUE: Multidetector CT imaging of the cervical spine was performed without intravenous contrast. Multiplanar CT image reconstructions were also generated. COMPARISON:  Head CT today reported separately. [facility] cervical spine MRI 07/19/2014. Cervical spine radiographs 02/13/2018. FINDINGS: Alignment: Chronic straightening of cervical lordosis similar to the prior MRI. Mild chronic retrolisthesis at C6-C7. Bilateral posterior element alignment is within normal limits. Skull base and vertebrae: Motion artifact C5 through C7. Visualized skull base is intact. No atlanto-occipital dissociation. C1 and C2 appear intact and normally aligned. No acute osseous abnormality identified. Soft tissues and spinal canal: No prevertebral fluid or swelling. No visible canal hematoma. Bilateral calcified carotid atherosclerosis in the neck. Disc levels: Cervical spine degeneration appears progressed since the 2016 MRI, likely with new or increased upper cervical spine mild degenerative stenosis such as at C2-C3 and C3-C4 (sagittal image 55). Lower cervical spinal stenosis to severe now. At C6-C7 could be moderate Upper chest: Low-density at least moderate size layering pleural effusions in both lung apices. No pneumothorax. Visible upper thoracic levels appear grossly intact. Calcified aortic atherosclerosis. Other: Carious  dentition. IMPRESSION: 1. No acute traumatic injury identified in the cervical spine. Mild motion artifact C5 through C7. 2. 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 3. Bilateral layering pleural effusions visible in the lung apices, at least moderate. 4. Aortic Atherosclerosis (ICD10-I70.0). 5. Carious dentition. Electronically Signed   By: Genevie Ann M.D.   On: 09/16/2019 19:38   CT Knee Left Wo Contrast  Result Date: 09/16/2019 CLINICAL DATA:  Status post fall, question of tibial plateau fracture EXAM: CT OF THE left  KNEE WITHOUT CONTRAST TECHNIQUE: Multidetector CT imaging of the left knee was performed according to the standard protocol. Multiplanar CT image reconstructions were also generated. COMPARISON:  None. FINDINGS: Bones/Joint/Cartilage No definite fracture or dislocation is seen. 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 A tiny amount of suprapatellar fluid is seen. Ligaments Suboptimally assessed by CT. There does appear to be  however thickening and edema seen around the lateral patellar retinaculum. Muscles and Tendons The muscles appear to be grossly intact. No focal atrophy or tear. The patellar and quadriceps tendon are intact. Soft tissues Significant overlying subcutaneous edema soft tissue swelling and small foci of subcutaneous emphysema are noted. Along the lateral patellar retinaculum there is a approximately 3 cm soft tissue hematoma. And along the proximal medial tibia there is a small 2 cm soft tissue hematoma. There is also overlying skin laceration seen. Scattered vascular calcifications are noted. IMPRESSION: No definite fracture seen. Edema and thickening seen around the lateral patellar retinaculum, likely due to intrasubstance sprain. Overlying prepatellar laceration with significant subcutaneous edema and small soft tissue  hematomas as described above. Electronically Signed   By: Prudencio Pair M.D.   On: 09/16/2019 19:40   DG Pelvis Portable  Result Date: 09/16/2019 CLINICAL DATA:  Status post fall. EXAM: PORTABLE PELVIS 1-2 VIEWS COMPARISON:  None. FINDINGS: There is no evidence of an acute pelvic fracture or diastasis. Mild chronic changes are seen along the symphysis pubis. A total right hip replacement is noted. There is no evidence of surrounding lucency to suggest the presence of hardware loosening or infection. Soft tissue structures are unremarkable. IMPRESSION: Intact right hip replacement. Electronically Signed   By: Virgina Norfolk M.D.   On: 09/16/2019 19:12   DG Chest Port 1 View  Result Date: 09/16/2019 CLINICAL DATA:  Status post fall with loss of consciousness. EXAM: PORTABLE CHEST 1 VIEW COMPARISON:  June 15, 2019 FINDINGS: Mild, stable diffusely increased lung markings are seen. Mild atelectasis and/or infiltrate is seen within the retrocardiac region of the left lung base. Small bilateral pleural effusions are noted. There is no evidence of a pneumothorax. The cardiac silhouette is mildly enlarged and unchanged in size. There is moderate severity calcification of the aortic arch. The visualized skeletal structures are unremarkable. IMPRESSION: 1. Mild left basilar atelectasis and/or infiltrate. 2. Small bilateral pleural effusions. Electronically Signed   By: Virgina Norfolk M.D.   On: 09/16/2019 19:07   DG Shoulder Left  Result Date: 09/16/2019 CLINICAL DATA:  Status post fall. EXAM: LEFT SHOULDER - 2+ VIEW COMPARISON:  None. FINDINGS: There is no evidence of acute fracture or dislocation. Moderate severity degenerative changes seen involving the distal left clavicle, left acromioclavicular joint and left acromion. Soft tissues are unremarkable. IMPRESSION: Degenerative changes, without evidence of acute osseous abnormality. Electronically Signed   By: Virgina Norfolk M.D.   On: 09/16/2019 19:14    DG Knee Left Port  Result Date: 09/16/2019 CLINICAL DATA:  Status post fall. EXAM: PORTABLE LEFT KNEE - 1-2 VIEW COMPARISON:  None. FINDINGS: It should be noted that the medial and lateral epicondyles of the distal left femur are limited in evaluation on the frontal view secondary to the presence of an overlying radiopaque bandage. No evidence of acute fracture, dislocation, or joint effusion. No evidence of arthropathy or other focal bone abnormality. There is mild infrapatellar soft tissue swelling. A mild to moderate amount of soft tissue air is also seen within this region. IMPRESSION: 1. Mild to moderate amount of infrapatellar soft tissue swelling and soft tissue air without evidence of acute osseous abnormality. Electronically Signed   By: Virgina Norfolk M.D.   On: 09/16/2019 19:10   DG Tibia/Fibula Left Port  Result Date: 09/16/2019 CLINICAL DATA:  Fall EXAM: PORTABLE LEFT TIBIA AND FIBULA - 2 VIEW COMPARISON:  None. FINDINGS: Infrapatellar soft tissue swelling with soft tissue air again noted as  seen on earlier knee series. No acute fracture, subluxation or dislocation. Old healed distal fibular shaft fracture. IMPRESSION: No acute bony abnormality. Electronically Signed   By: Rolm Baptise M.D.   On: 09/16/2019 19:32    EKG: Independently reviewed.  Junctional bradycardia, heart rate 36.  RBBB and LPFB.  QTc 535.  No prior EKG for comparison.  Assessment/Plan Principal Problem:   Symptomatic bradycardia Active Problems:   A-fib (HCC)   QT prolongation   COPD with acute exacerbation (HCC)   AKI (acute kidney injury) (HCC)   Symptomatic bradycardia: Appears to be junctional bradycardia with heart rate in the 40s at present.  Hemodynamically stable.  Per review of care everywhere, patient is on both metoprolol and Cardizem.  He does not have his medications with him at this time and dose unknown.  Plan is to continue cardiac monitoring.  Hold AV nodal blocking agents until pharmacy med  rec is done.  Then adjust dose of home medications.  Order TSH.  Patient mentions history of valvular issues, will order echocardiogram.  Discussed with Dr. Marletta Lor from cardiology who is in agreement with this plan.  She will speak to daytime cardiology team and EP so that the patient can be seen in the morning.  If any signs of clinical instability overnight, atropine will be given and cardiology will see the patient.  COPD with acute exacerbation: Patient reports history of COPD.  Has diffuse rhonchi on exam and noted to be coughing aggressively.  Not hypoxic.  Start Solu-Medrol 60 mg every 6 hours, DuoNebs every 6 hours, albuterol nebulizer as needed, Pulmicort twice daily, and Mucinex DM.  Chest x-ray with question of mild left basilar atelectasis and/or infiltrate.  Pneumonia less likely given no fever or leukocytosis.  Check procalcitonin level.  Normocytic anemia: Hemoglobin 9.1, MCV 95.  No prior labs for comparison.  Order FOBT and anemia panel.  Mild thrombocytopenia: Continue to monitor.  AKI versus CKD: BUN 45, creatinine 2.8.  No prior labs for comparison.  Please obtain records from PCP in the morning. Will hold off giving fluid given history of CHF and signs of volume overload on exam.  Order renal ultrasound.  Monitor urine output.  Order urine sodium and creatinine.  Left knee laceration and sprain: CT of left knee negative for fracture.  Showing edema and thickening around the lateral patellar retinaculum, likely due to intrasubstance sprain.  Overlying prepatellar laceration with significant subcutaneous edema and small soft tissue hematomas. Repaired in the ED.  Patient received Tdap booster.  Continue cefazolin.  Orthopedic team will see the patient in the morning.  QT prolongation on EKG: Continue cardiac monitoring.  Keep potassium above 4 and magnesium above 2.  Avoid QT prolonging drugs if possible.  Repeat EKG in a.m.  Atrial fibrillation: Currently bradycardic.  Hold  beta-blocker and calcium channel blocker.  Hold home Eliquis to reduce risk of bleeding from large knee laceration.  CHF: Chest x-ray with small bilateral pleural effusions but no overt pulmonary edema.  Not hypoxic.  Does have peripheral edema on exam.  Echocardiogram has been ordered.  Diabetes mellitus: Unclear whether he is on insulin or oral hypoglycemic agents.  Check A1c.  Order sliding scale insulin sensitive ACHS and CBG checks.  DVT prophylaxis: SCDs Code Status: Patient wishes to be full code. Family Communication: No family available at this time. Disposition Plan: Anticipate discharge after clinical improvement. Consults called: Cardiology Admission status: It is my clinical opinion that admission to INPATIENT is reasonable and  necessary because of the expectation that this patient will require hospital care that crosses at least 2 midnights to treat this condition based on the medical complexity of the problems presented.  Given the aforementioned information, the predictability of an adverse outcome is felt to be significant.  The medical decision making on this patient was of high complexity and the patient is at high risk for clinical deterioration, therefore this is a level 3 visit.  Shela Leff MD [facility]  If 7PM-7AM, please contact night-coverage [email or web address]  09/17/2019, 12:54 AM

## 2019-09-17 ENCOUNTER — Inpatient Hospital Stay (HOSPITAL_COMMUNITY): Payer: Medicare HMO

## 2019-09-17 ENCOUNTER — Encounter (HOSPITAL_COMMUNITY): Payer: Self-pay | Admitting: Internal Medicine

## 2019-09-17 DIAGNOSIS — R001 Bradycardia, unspecified: Secondary | ICD-10-CM | POA: Diagnosis not present

## 2019-09-17 DIAGNOSIS — S81012A Laceration without foreign body, left knee, initial encounter: Secondary | ICD-10-CM | POA: Diagnosis present

## 2019-09-17 DIAGNOSIS — N179 Acute kidney failure, unspecified: Secondary | ICD-10-CM

## 2019-09-17 DIAGNOSIS — I350 Nonrheumatic aortic (valve) stenosis: Secondary | ICD-10-CM | POA: Diagnosis not present

## 2019-09-17 DIAGNOSIS — I4819 Other persistent atrial fibrillation: Secondary | ICD-10-CM | POA: Diagnosis not present

## 2019-09-17 DIAGNOSIS — R9431 Abnormal electrocardiogram [ECG] [EKG]: Secondary | ICD-10-CM

## 2019-09-17 DIAGNOSIS — J441 Chronic obstructive pulmonary disease with (acute) exacerbation: Secondary | ICD-10-CM

## 2019-09-17 DIAGNOSIS — R55 Syncope and collapse: Secondary | ICD-10-CM

## 2019-09-17 HISTORY — DX: Bradycardia, unspecified: R00.1

## 2019-09-17 LAB — URINALYSIS, ROUTINE W REFLEX MICROSCOPIC
Bilirubin Urine: NEGATIVE
Glucose, UA: NEGATIVE mg/dL
Hgb urine dipstick: NEGATIVE
Ketones, ur: NEGATIVE mg/dL
Leukocytes,Ua: NEGATIVE
Nitrite: NEGATIVE
Protein, ur: NEGATIVE mg/dL
Specific Gravity, Urine: 1.01 (ref 1.005–1.030)
pH: 5 (ref 5.0–8.0)

## 2019-09-17 LAB — BASIC METABOLIC PANEL
Anion gap: 12 (ref 5–15)
BUN: 48 mg/dL — ABNORMAL HIGH (ref 8–23)
CO2: 25 mmol/L (ref 22–32)
Calcium: 8.8 mg/dL — ABNORMAL LOW (ref 8.9–10.3)
Chloride: 102 mmol/L (ref 98–111)
Creatinine, Ser: 2.89 mg/dL — ABNORMAL HIGH (ref 0.61–1.24)
GFR calc Af Amer: 25 mL/min — ABNORMAL LOW (ref >60)
GFR calc non Af Amer: 21 mL/min — ABNORMAL LOW (ref >60)
Glucose, Bld: 137 mg/dL — ABNORMAL HIGH (ref 70–99)
Potassium: 4.6 mmol/L (ref 3.5–5.1)
Sodium: 139 mmol/L (ref 135–145)

## 2019-09-17 LAB — PROTIME-INR
INR: 1.7 — ABNORMAL HIGH (ref 0.8–1.2)
Prothrombin Time: 19.9 seconds — ABNORMAL HIGH (ref 11.4–15.2)

## 2019-09-17 LAB — PROCALCITONIN: Procalcitonin: <0.1 ng/mL

## 2019-09-17 LAB — GLUCOSE, CAPILLARY
Glucose-Capillary: 175 mg/dL — ABNORMAL HIGH (ref 70–99)
Glucose-Capillary: 169 mg/dL — ABNORMAL HIGH (ref 70–99)
Glucose-Capillary: 169 mg/dL — ABNORMAL HIGH (ref 70–99)

## 2019-09-17 LAB — HEMOGLOBIN AND HEMATOCRIT, BLOOD
HCT: 23.7 % — ABNORMAL LOW (ref 39.0–52.0)
HCT: 23.9 % — ABNORMAL LOW (ref 39.0–52.0)
Hemoglobin: 7.3 g/dL — ABNORMAL LOW (ref 13.0–17.0)
Hemoglobin: 7.4 g/dL — ABNORMAL LOW (ref 13.0–17.0)

## 2019-09-17 LAB — IRON AND TIBC
Iron: 33 ug/dL — ABNORMAL LOW (ref 45–182)
Saturation Ratios: 11 % — ABNORMAL LOW (ref 17.9–39.5)
TIBC: 307 ug/dL (ref 250–450)
UIBC: 274 ug/dL

## 2019-09-17 LAB — CBC
HCT: 25.4 % — ABNORMAL LOW (ref 39.0–52.0)
Hemoglobin: 7.8 g/dL — ABNORMAL LOW (ref 13.0–17.0)
MCH: 29.9 pg (ref 26.0–34.0)
MCHC: 30.7 g/dL (ref 30.0–36.0)
MCV: 97.3 fL (ref 80.0–100.0)
Platelets: 120 10*3/uL — ABNORMAL LOW (ref 150–400)
RBC: 2.61 MIL/uL — ABNORMAL LOW (ref 4.22–5.81)
RDW: 22.8 % — ABNORMAL HIGH (ref 11.5–15.5)
WBC: 8.6 10*3/uL (ref 4.0–10.5)
nRBC: 0.3 % — ABNORMAL HIGH (ref 0.0–0.2)

## 2019-09-17 LAB — FERRITIN: Ferritin: 96 ng/mL (ref 24–336)

## 2019-09-17 LAB — CREATININE, URINE, RANDOM: Creatinine, Urine: 60.37 mg/dL

## 2019-09-17 LAB — RETICULOCYTES
Immature Retic Fract: 36.9 % — ABNORMAL HIGH (ref 2.3–15.9)
RBC.: 3.15 MIL/uL — ABNORMAL LOW (ref 4.22–5.81)
Retic Count, Absolute: 48.8 10*3/uL (ref 19.0–186.0)
Retic Ct Pct: 1.6 % (ref 0.4–3.1)

## 2019-09-17 LAB — CBG MONITORING, ED
Glucose-Capillary: 106 mg/dL — ABNORMAL HIGH (ref 70–99)
Glucose-Capillary: 130 mg/dL — ABNORMAL HIGH (ref 70–99)

## 2019-09-17 LAB — TSH: TSH: 3.253 u[IU]/mL (ref 0.350–4.500)

## 2019-09-17 LAB — ECHOCARDIOGRAM COMPLETE
Height: 72 in
Weight: 3040 oz

## 2019-09-17 LAB — FOLATE: Folate: 9.7 ng/mL (ref >5.9)

## 2019-09-17 LAB — HIV ANTIBODY (ROUTINE TESTING W REFLEX): HIV Screen 4th Generation wRfx: NONREACTIVE

## 2019-09-17 LAB — MAGNESIUM: Magnesium: 2.2 mg/dL (ref 1.7–2.4)

## 2019-09-17 LAB — VITAMIN B12: Vitamin B-12: 389 pg/mL (ref 180–914)

## 2019-09-17 LAB — SODIUM, URINE, RANDOM: Sodium, Ur: 74 mmol/L

## 2019-09-17 LAB — PREPARE RBC (CROSSMATCH)

## 2019-09-17 LAB — ABO/RH: ABO/RH(D): O POS

## 2019-09-17 MED ORDER — LIDOCAINE-EPINEPHRINE (PF) 2 %-1:200000 IJ SOLN
INTRAMUSCULAR | Status: AC
  Filled 2019-09-17: qty 20

## 2019-09-17 MED ORDER — TAMSULOSIN HCL 0.4 MG PO CAPS
0.4000 mg | ORAL_CAPSULE | Freq: Every day | ORAL | Status: DC
  Administered 2019-09-17 – 2019-09-28 (×12): 0.4 mg via ORAL
  Filled 2019-09-17 (×12): qty 1

## 2019-09-17 MED ORDER — ONDANSETRON HCL 4 MG/2ML IJ SOLN
INTRAMUSCULAR | Status: AC
  Administered 2019-09-17: 4 mg via INTRAVENOUS
  Filled 2019-09-17: qty 2

## 2019-09-17 MED ORDER — ONDANSETRON HCL 4 MG/2ML IJ SOLN: 4.0000 mg | Freq: Once | INTRAMUSCULAR | Status: AC

## 2019-09-17 MED ORDER — MORPHINE SULFATE (PF) 2 MG/ML IV SOLN
1.0000 mg | INTRAVENOUS | Status: DC | PRN
  Administered 2019-09-18 – 2019-09-20 (×4): 1 mg via INTRAVENOUS
  Administered 2019-09-20 – 2019-09-25 (×11): 2 mg via INTRAVENOUS
  Filled 2019-09-17 (×16): qty 1

## 2019-09-17 MED ORDER — MORPHINE SULFATE (PF) 4 MG/ML IV SOLN: 4.0000 mg | Freq: Once | INTRAVENOUS | Status: AC

## 2019-09-17 MED ORDER — VITAMIN K1 10 MG/ML IJ SOLN
10.0000 mg | INTRAVENOUS | Status: AC
  Administered 2019-09-17: 10 mg via INTRAVENOUS
  Filled 2019-09-17: qty 1

## 2019-09-17 MED ORDER — LIDOCAINE-EPINEPHRINE (PF) 2 %-1:200000 IJ SOLN: 20.0000 mL | Freq: Once | INTRAMUSCULAR | Status: DC

## 2019-09-17 MED ORDER — ENSURE ENLIVE PO LIQD: 237.0000 mL | Freq: Two times a day (BID) | ORAL | Status: DC

## 2019-09-17 MED ORDER — CLOPIDOGREL BISULFATE 75 MG PO TABS
75.0000 mg | ORAL_TABLET | Freq: Every day | ORAL | Status: DC
  Administered 2019-09-17 – 2019-09-28 (×12): 75 mg via ORAL
  Filled 2019-09-17 (×12): qty 1

## 2019-09-17 MED ORDER — BUDESONIDE 0.25 MG/2ML IN SUSP
0.2500 mg | Freq: Two times a day (BID) | RESPIRATORY_TRACT | Status: DC
  Administered 2019-09-17 – 2019-09-28 (×22): 0.25 mg via RESPIRATORY_TRACT
  Filled 2019-09-17 (×23): qty 2

## 2019-09-17 MED ORDER — SODIUM CHLORIDE 0.9% IV SOLUTION: Freq: Once | INTRAVENOUS | Status: AC

## 2019-09-17 MED ORDER — DM-GUAIFENESIN ER 30-600 MG PO TB12
1.0000 | ORAL_TABLET | Freq: Two times a day (BID) | ORAL | Status: DC
  Administered 2019-09-17 – 2019-09-23 (×12): 1 via ORAL
  Filled 2019-09-17 (×16): qty 1

## 2019-09-17 MED ORDER — GLUCERNA SHAKE PO LIQD
237.0000 mL | Freq: Three times a day (TID) | ORAL | Status: DC
  Administered 2019-09-17 – 2019-09-28 (×28): 237 mL via ORAL
  Filled 2019-09-17: qty 237

## 2019-09-17 MED ORDER — MORPHINE SULFATE (PF) 4 MG/ML IV SOLN
INTRAVENOUS | Status: AC
  Administered 2019-09-17: 4 mg via INTRAVENOUS
  Filled 2019-09-17: qty 1

## 2019-09-17 MED ORDER — ASPIRIN 81 MG PO CHEW
81.0000 mg | CHEWABLE_TABLET | Freq: Every day | ORAL | Status: DC
  Administered 2019-09-17 – 2019-09-28 (×12): 81 mg via ORAL
  Filled 2019-09-17 (×12): qty 1

## 2019-09-17 MED ORDER — DULOXETINE HCL 60 MG PO CPEP
60.0000 mg | ORAL_CAPSULE | Freq: Every day | ORAL | Status: DC
  Administered 2019-09-17 – 2019-09-24 (×8): 60 mg via ORAL
  Filled 2019-09-17 (×8): qty 1

## 2019-09-17 MED ORDER — ADULT MULTIVITAMIN W/MINERALS CH
1.0000 | ORAL_TABLET | Freq: Every day | ORAL | Status: DC
  Administered 2019-09-18 – 2019-09-28 (×11): 1 via ORAL
  Filled 2019-09-17 (×11): qty 1

## 2019-09-17 MED ORDER — PROTHROMBIN COMPLEX CONC HUMAN 500 UNITS IV KIT
1629.0000 [IU] | PACK | Status: AC
  Administered 2019-09-17: 1629 [IU] via INTRAVENOUS
  Filled 2019-09-17: qty 629

## 2019-09-17 MED ORDER — METHYLPREDNISOLONE SODIUM SUCC 125 MG IJ SOLR
60.0000 mg | Freq: Four times a day (QID) | INTRAMUSCULAR | Status: DC
  Administered 2019-09-17 – 2019-09-18 (×7): 60 mg via INTRAVENOUS
  Filled 2019-09-17 (×7): qty 2

## 2019-09-17 MED ORDER — IPRATROPIUM-ALBUTEROL 0.5-2.5 (3) MG/3ML IN SOLN
3.0000 mL | Freq: Four times a day (QID) | RESPIRATORY_TRACT | Status: DC
  Administered 2019-09-17 – 2019-09-19 (×10): 3 mL via RESPIRATORY_TRACT
  Filled 2019-09-17 (×11): qty 3

## 2019-09-17 MED ORDER — EMPTY CONTAINERS FLEXIBLE MISC: 900.0000 mg | Freq: Once | Status: DC

## 2019-09-17 MED ORDER — HYDROCODONE-ACETAMINOPHEN 5-325 MG PO TABS
1.0000 | ORAL_TABLET | Freq: Four times a day (QID) | ORAL | Status: DC | PRN
  Administered 2019-09-17: 1 via ORAL
  Administered 2019-09-17: 2 via ORAL
  Administered 2019-09-17 – 2019-09-20 (×8): 1 via ORAL
  Administered 2019-09-20 – 2019-09-28 (×23): 2 via ORAL
  Administered 2019-09-28: 1 via ORAL
  Filled 2019-09-17: qty 2
  Filled 2019-09-17: qty 1
  Filled 2019-09-17 (×3): qty 2
  Filled 2019-09-17: qty 1
  Filled 2019-09-17 (×4): qty 2
  Filled 2019-09-17 (×2): qty 1
  Filled 2019-09-17 (×2): qty 2
  Filled 2019-09-17 (×2): qty 1
  Filled 2019-09-17: qty 2
  Filled 2019-09-17: qty 1
  Filled 2019-09-17: qty 2
  Filled 2019-09-17: qty 1
  Filled 2019-09-17 (×8): qty 2
  Filled 2019-09-17 (×2): qty 1
  Filled 2019-09-17 (×4): qty 2

## 2019-09-17 MED ORDER — ALBUTEROL SULFATE (2.5 MG/3ML) 0.083% IN NEBU: 2.5000 mg | INHALATION_SOLUTION | RESPIRATORY_TRACT | Status: DC | PRN

## 2019-09-17 MED ORDER — FUROSEMIDE 10 MG/ML IJ SOLN
20.0000 mg | Freq: Once | INTRAMUSCULAR | Status: AC
  Administered 2019-09-17: 20 mg via INTRAVENOUS
  Filled 2019-09-17: qty 2

## 2019-09-17 NOTE — ED Notes (Signed)
Pt bleeding profusely through dressing again. MD notified, coming to assess pt. RN reinforced dressings, light pressure applied.

## 2019-09-17 NOTE — Progress Notes (Signed)
Patient son updated and Md paged to make aware of medical Records sent from [facility].

## 2019-09-17 NOTE — ED Notes (Signed)
Breakfast Ordered 

## 2019-09-17 NOTE — ED Notes (Signed)
Pt transferred to IP. Trauma End

## 2019-09-17 NOTE — ED Notes (Signed)
Tele

## 2019-09-17 NOTE — ED Notes (Signed)
Care endorsed to Reynolds, RN

## 2019-09-17 NOTE — ED Notes (Signed)
Left knee continues to bleed through several layers of dressings. RN rec'd verbal order from MD for combat gauze to be applied to site. Combat gauze reinforced with ABD pads and thoroughly wrapped to apply pressure to site. Pt continues to bleed, penetrating trauma dressings. RN applied more pressure and dressings to site, paged MD for further instructions. MD returned page; orders placed, CCM to consult.

## 2019-09-17 NOTE — ED Notes (Signed)
Labs collected by phlebotomy Pt taken to [place] in NAD

## 2019-09-17 NOTE — ED Notes (Signed)
CCM at bedside. RN given verbal order for lidocaine w/ epi. Medication and suture cart at bedside.

## 2019-09-17 NOTE — Progress Notes (Signed)
Initial Nutrition Assessment  DOCUMENTATION CODES:   Not applicable  INTERVENTION:   -D/c Ensure Enlive po BID, each supplement provides 350 kcal and 20 grams of protein -Glucerna Shake po TID, each supplement provides 220 kcal and 10 grams of protein -MVI with minerals daily  NUTRITION DIAGNOSIS:   Increased nutrient needs related to wound healing as evidenced by estimated needs.  GOAL:   Patient will meet greater than or equal to 90% of their needs  MONITOR:   PO intake, Supplement acceptance, Labs, Weight trends, Skin, I & O's  REASON FOR ASSESSMENT:   Malnutrition Screening Tool    ASSESSMENT:   Justin Boone is a 69 y.o. male with medical history significant of A. fib on Eliquis, CHF, diabetes, hypertension, COPD presenting to the ED for evaluation of head injury and a large laceration to the left knee after a fall.  Patient states he has been feeling very weak and dizzy this past week.  He has noticed that his heart "beats and then stops beating for a long time."  Today he fell twice and injured his left knee and left shoulder.  During the second fall he even hit his head and lost consciousness.  He takes metoprolol and Eliquis.  Not sure which other medications he takes as he does not have them with him at this time.  His cardiologist is outside of [place] and he cannot recall the name at this time.  States he has been told that he has problems with aorta and another heart valve.  Reports having chronic shortness of breath and cough secondary to COPD, no recent change.  Denies chest pain.  Denies fevers, nausea, vomiting, abdominal pain, or diarrhea.  Pt admitted with symptomatic bradycardia and COPD exacerbation.  Reviewed I/O's: +250 ml x 24 hours  Pt receiving echo at time of visit. Unable to speak with pt or completion nutrition-focused physical exam at this time.   Case discussed with RN, who reports pt was recently transferred to unit. Per RN, pt suffered a lot  of bleeding from knee laceration and just underwent blood transfusion (from to check Hgb again at 1500). Pt recently lost his wife and fell at home.   Per RN, pt with a great appetite. CBGS have been controlled, but pt on IV solu-medrol. Pt likes milkshakes and was provided an Ensure supplement earlier.   Per [facility] records, last recorded was on 09/09/19 (215#), which demonstrates a 11.5% wt loss over the past week. RD unsure about accuracy of weights.   Medications reviewed and include lasix and solu-medrol.  No results found for: HGBA1C PTA DM medications are none.   Labs reviewed: CBGS: 130 (inpatient orders for glycemic control are 0-5 units insulin aspart q HS and 0-9 units insulin aspart TID with meals).   Diet Order:   Diet Order            Diet heart healthy/carb modified Room service appropriate? Yes; Fluid consistency: Thin  Diet effective now              EDUCATION NEEDS:   Education needs have been addressed  Skin:  Skin Assessment: Reviewed RN Assessment  Last BM:  Unknown  Height:   Ht Readings from Last 1 Encounters:  09/16/19 6' (1.829 m)    Weight:   Wt Readings from Last 1 Encounters:  09/16/19 86.2 kg    Ideal Body Weight:  80.9 kg  BMI:  Body mass index is 25.77 kg/m.  Estimated Nutritional Needs:  Kcal:  2000-2200  Protein:  105-120 grams  Fluid:  > 2 L    Loistine Chance, RD, LDN, [place] Registered Dietitian II Certified Diabetes Care and Education Specialist Please refer to [facility] for RD and/or RD on-call/weekend/after hours pager

## 2019-09-17 NOTE — Consult Note (Signed)
NAME:  Justin Boone, MRN:  [id number], DOB:  05-Sep-1950, LOS: 1 ADMISSION DATE:  09/16/2019, CONSULTATION DATE:  09/17/19 REFERRING MD:  Dr. Karleen Hampshire, CHIEF COMPLAINT:  Knee pain   Brief History   Justin Boone is a 69 y.o. male with medical history significant of A. fib on Eliquis, CHF, diabetes, hypertension, COPD presenting to the ED for evaluation of head injury and a large laceration to the left knee after a fall. His knee laceration was repaired in the ED; however, unfortunately this AM he developed diffuse bleeding from his knee and PCCM was consulted for recommendations.   History of present illness   Justin Boone is a 69 y.o. male with medical history significant of A. fib on Eliquis, CHF, diabetes, hypertension, COPD presenting to the ED for evaluation of head injury and a large laceration to the left knee after a fall. For the last 1-2 weeks the patient has been feeling weak and dizzy. On the day of admission, 4/8, he was walking in his living room, lost his balance and fell to the floor. He struck his left lateral knee on the table resulting in a significant laceration. In the ED he was found to be hemodynamically stable with a bradycardic junctional rhythm. This AM he was noticed to have continuing bleeding and his Hgb dropped 2 points. Orthopedic surgery was consulted and recommended compression. Unfortunately it continued to bleed and we were consulted for further evaluation/management. His last dose of Eliquis was the morning of 09/16/19.  He currently denies lightheadedness, blurry vision, chest pain, SHOB, cough, abdominal pain, N/V.   Past Medical History   Past Medical History:  Diagnosis Date  . A-fib ([place])   . CHF (congestive heart failure) ([place])    Significant Hospital Events   4/8: Admitted for bradycardia and falls  4/9: Worsening l knee laceration bleeding with 2 point Hgb drop.  Consults:  Orthopedic surgery  Cardiology  PCCM  Procedures:  None  Significant  Diagnostic Tests:  Chest x-ray showing mild left basilar atelectasis and/or infiltrate.  Small bilateral pleural effusions. X-ray of pelvis negative for hip fracture. X-ray of left forearm negative. X-ray of left knee with mild to moderate amount of infrapatellar soft tissue swelling and soft tissue air without evidence of acute osseous abnormality. X-ray of left tibia/fibula negative for acute bony abnormality. X-ray of left shoulder showing degenerative changes without evidence of acute osseous abnormality. CT of left knee negative for fracture.  Showing edema and thickening around the lateral patellar retinaculum, likely due to intrasubstance sprain.  Overlying prepatellar laceration with significant subcutaneous edema and small soft tissue hematomas. CT head negative for acute intracranial abnormality or traumatic injury. CT C-spine negative for acute traumatic injury.  Showing chronic cervical spine degeneration which appears progressed since 2016 MRI with moderate to severe lower cervical spinal stenosis now possible at C6-C7.  Micro Data:  4/8: SARs COVID negative  Antimicrobials:  None  Interim history/subjective:  See above  Objective   Blood pressure 125/67, pulse (!) 50, temperature 98.5 F (36.9 C), temperature source Oral, resp. rate 12, height 6' (1.829 m), weight 86.2 kg, SpO2 98 %.        Intake/Output Summary (Last 24 hours) at 09/17/2019 1003 Last data filed at 09/16/2019 1816 Gross per 24 hour  Intake 250 ml  Output --  Net 250 ml   Filed Weights   09/16/19 1820  Weight: 86.2 kg   Examination: General: Obese male in no acute distress HENT: Normocephalic, atraumatic, moist  mucus membranes Pulm: Good air movement with course upper airway sounds CV: Bradycardic but regular rhythm,, no murmurs, no rubs  Abdomen: Active bowel sounds, soft, non-distended, no tenderness to palpation  Extremities: Pulses palpable in all extremities, 1+ pitting LE edema to the mid  shin Skin: Warm and dry  Neuro: Alert and oriented x 3  Resolved Hospital Problem list   None  Assessment & Plan:   Symptomatic Bradycardia  - Junctional bradycardia on admission. Sounds symptomatic -  Holding nodal blocking agents  - Cardiology has been consulted.   Left Knee Laceration  - Significant laceration, see media for image  - Orthopedic surgery has evaluated and recommends medical management  - Last dose of Eliquis 4/8 in AM  - Given KCentra and vitamin K  - Continue compression wrapping  - Giving 1 unit pRBCs - If continues to bleed mayneed to inject lidocaine and epiephrine  Atrial Fibrillation  - Holding Eliquis due to significant left knee laceration  - Holding metoprolol and amiodarone  - Cardiology consulted. Appreciate recs.   DM  - Not on outpatient medications  - CBGs + SSI  HTN - Currently normotensive  - Holding metoprolol in setting of symptomatic hypotension   HFrEF (LVEF 35-40% on echo in 06/2019) CAD s/p DES to the RCA on 08/12/19 AS pending TAVR work-up  - DES placed to the RCA on 08/12/19. Continue Plavix and ASA to prevent in-stent thrombosis (even with current bleeding) - Holding metoprolol due to symptomatic bradycardia  - Holding torsemide given hypovolemia  - Continue atorvastatin   COPD - On Trelegy at home  Best practice:  Per primary.  Labs   CBC: Recent Labs  Lab 09/16/19 1817 09/16/19 1820 09/17/19 0821  WBC 8.3  --  8.6  HGB 9.1* 9.9* 7.8*  HCT 29.4* 29.0* 25.4*  MCV 95.5  --  97.3  PLT 120*  --  120*    Basic Metabolic Panel: Recent Labs  Lab 09/16/19 1817 09/16/19 1820 09/16/19 2352 09/17/19 0821  NA 137 136  --  139  K 4.7 4.4  --  4.6  CL 103 104  --  102  CO2 24  --   --  25  GLUCOSE 134* 122*  --  137*  BUN 45* 42*  --  48*  CREATININE 2.87* 3.00*  --  2.89*  CALCIUM 8.9  --   --  8.8*  MG  --   --  2.2  --    GFR: Estimated Creatinine Clearance: 27.2 mL/min (A) (by C-G formula based on SCr of  2.89 mg/dL (H)). Recent Labs  Lab 09/16/19 1817 09/16/19 1826 09/16/19 2352 09/17/19 0821  PROCALCITON  --   --  <0.10  --   WBC 8.3  --   --  8.6  LATICACIDVEN  --  1.4  --   --     Liver Function Tests: Recent Labs  Lab 09/16/19 1817  AST 18  ALT 15  ALKPHOS 110  BILITOT 1.1  PROT 5.7*  ALBUMIN 2.8*   No results for input(s): LIPASE, AMYLASE in the last 168 hours. No results for input(s): AMMONIA in the last 168 hours.  ABG    Component Value Date/Time   TCO2 28 09/16/2019 1820     Coagulation Profile: Recent Labs  Lab 09/16/19 1817  INR 2.4*    Cardiac Enzymes: No results for input(s): CKTOTAL, CKMB, CKMBINDEX, TROPONINI in the last 168 hours.  HbA1C: No results found for: HGBA1C  CBG:  Recent Labs  Lab 09/17/19 0132 09/17/19 0800  GLUCAP 106* 130*    Review of Systems:   12 point ROS preformed. All negative aside from those mentioned in the HPI.  Past Medical History  He,  has a past medical history of A-fib ([place]) and CHF (congestive heart failure) ([place]).   Surgical History    Past Surgical History:  Procedure Laterality Date  . SHOULDER SURGERY       Social History   reports that he has been smoking cigarettes. He has a 25.00 pack-year smoking history. He has never used smokeless tobacco. He reports that he does not drink alcohol or use drugs.   Family History   His family history is not on file.   Allergies Allergies  Allergen Reactions  . Contrast Media [Iodinated Diagnostic Agents]   . Sulfa Antibiotics      Home Medications  Prior to Admission medications   Medication Sig Start Date End Date Taking? Authorizing Provider  albuterol (PROVENTIL) (2.5 MG/3ML) 0.083% nebulizer solution Inhale 3 mLs into the lungs every 6 (six) hours as needed for shortness of breath or wheezing. 08/27/19  Yes [provider]  allopurinol (ZYLOPRIM) 300 MG tablet Take 300 mg by mouth daily. 08/20/19  Yes [provider]  amiodarone  (PACERONE) 200 MG tablet Take 200 mg by mouth daily. 08/12/19  Yes [provider]  atorvastatin (LIPITOR) 80 MG tablet Take 80 mg by mouth daily. 08/20/19  Yes [provider]  clopidogrel (PLAVIX) 75 MG tablet Take 75 mg by mouth daily. 08/12/19  Yes [provider]  DULoxetine (CYMBALTA) 60 MG capsule Take 60 mg by mouth daily. 07/30/19  Yes [provider]  ELIQUIS 5 MG TABS tablet Take 5 mg by mouth 2 (two) times daily. 08/20/19  Yes [provider]  metoprolol succinate (TOPROL-XL) 50 MG 24 hr tablet Take 50 mg by mouth daily. 08/20/19  Yes [provider]  tamsulosin (FLOMAX) 0.4 MG CAPS capsule Take 0.4 mg by mouth daily. 07/30/19  Yes [provider]  torsemide (DEMADEX) 20 MG tablet Take 20 mg by mouth daily. 08/27/19  Yes [provider]  TRELEGY ELLIPTA 100-62.5-25 MCG/INH AEPB Inhale 1 puff into the lungs daily. 08/27/19  Yes [provider]       Ina Homes, MD  [facility] PGY3  Pager: [phone]

## 2019-09-17 NOTE — Consult Note (Addendum)
Cardiology Consultation:   Patient ID: Justin Justin; [id number]; Aug 04, 1950   Admit date: 09/16/2019 Date of Consult: 09/17/2019  Primary Care Provider: Patient, No Pcp Per Primary Cardiologist: Posey Boyer, MD  Dr Johnsie Cancel while here Primary Electrophysiologist:  None   Patient Profile:   Justin Justin is a 69 y.o. male with a hx of tobacco use, COPD, OSA on CPAP, HTN, DM, CKD (baseline 1.50-1.9, but 3.26 on 06/23/19 and improved 2.2), atrial fib/flutter s/p cardioversion on amiodarone and Eliquis, chronic HFrEF (40%),RBBB, PACs, moderate MR, and low flow low gradient aortic valve stenosis s/p LHC in the setting of TAVR work up on 08/12/2019.  He received a DES to the LAD and has been on Plavix plus Eliquis since then.  He is being seen today for the evaluation of bradycardia in the setting of syncope at the request of Dr. Karleen Hampshire.  History of Present Illness:   Justin Justin admits that he has good days and bad days.  He has been struggling because there are more bad days than good days.  On the bad days, it will take him a couple of hours to get out of bed and he will have to drink coffee and sit up for a while before he can do anything.  He states he was on Lopressor 50 mg daily, but was changed to Toprol-XL 50 mg daily several months ago.  Ever since then, he has noticed that his heart rate is lower.  He says it will be in the 40s at times.  His scales were broken by family member, and he has not been able to weigh himself for about a month.  For about a month, he has been dealing with increasing dyspnea on exertion.  He has also had increased lower extremity edema, right greater than left.  He wears compression socks which helps some.  He denies orthopnea, has been sleeping on 2 pillows for years.  However, he has been having PND almost nightly.  He says his allergies have been bad for about a month and he has been dealing with congestion as well.  He has not had any chest pain since the  PCI.  He has been compliant with the Plavix.  He has not had palpitations, does not think he has had any atrial fibrillation lately.  He has been compliant with the Eliquis.  Yesterday, he stood up out of a chair and woke up on the floor.  This was just in the early afternoon.  He laid there while and was able to get up.  Later on, he went to his daughter's house and came back.  After he came back he stood up to go into the kitchen.  He felt himself getting rubber legged and again woke up on the floor.  This time, there were injuries and he had problems with blood around him.  He is currently in the ER, has a significant left leg wound that is currently bandaged and dressed, they are having trouble getting the bleeding stopped and he is getting a transfusion.  Other wounds are not actively bleeding.  He is having significant musculoskeletal pain from the injuries.   Past Medical History:  Diagnosis Date  . A-fib ([place])   . CHF (congestive heart failure) ([place])   . Diabetes mellitus type II, controlled ([place])   . HTN (hypertension)     Past Surgical History:  Procedure Laterality Date  . CHOLECYSTECTOMY    . SHOULDER SURGERY  Prior to Admission medications   Medication Sig Start Date End Date Taking? Authorizing Provider  albuterol (PROVENTIL) (2.5 MG/3ML) 0.083% nebulizer solution Inhale 3 mLs into the lungs every 6 (six) hours as needed for shortness of breath or wheezing. 08/27/19  Yes [provider]  allopurinol (ZYLOPRIM) 300 MG tablet Take 300 mg by mouth daily. 08/20/19  Yes [provider]  amiodarone (PACERONE) 200 MG tablet Take 200 mg by mouth daily. 08/12/19  Yes [provider]  atorvastatin (LIPITOR) 80 MG tablet Take 80 mg by mouth daily. 08/20/19  Yes [provider]  clopidogrel (PLAVIX) 75 MG tablet Take 75 mg by mouth daily. 08/12/19  Yes [provider]  DULoxetine (CYMBALTA) 60 MG capsule Take 60 mg by mouth daily. 07/30/19   Yes [provider]  ELIQUIS 5 MG TABS tablet Take 5 mg by mouth 2 (two) times daily. 08/20/19  Yes [provider]  metoprolol succinate (TOPROL-XL) 50 MG 24 hr tablet Take 50 mg by mouth daily. 08/20/19  Yes [provider]  tamsulosin (FLOMAX) 0.4 MG CAPS capsule Take 0.4 mg by mouth daily. 07/30/19  Yes [provider]  torsemide (DEMADEX) 20 MG tablet Take 20 mg by mouth daily. 08/27/19  Yes [provider]  TRELEGY ELLIPTA 100-62.5-25 MCG/INH AEPB Inhale 1 puff into the lungs daily. 08/27/19  Yes [provider]    Inpatient Medications: Scheduled Meds: . budesonide (PULMICORT) nebulizer solution  0.25 mg Nebulization BID  . clopidogrel  75 mg Oral Daily  . dextromethorphan-guaiFENesin  1 tablet Oral BID  . DULoxetine  60 mg Oral Daily  . insulin aspart  0-5 Units Subcutaneous QHS  . insulin aspart  0-9 Units Subcutaneous TID WC  . ipratropium-albuterol  3 mL Nebulization Q6H  . lidocaine-EPINEPHrine  20 mL Intradermal Once  . methylPREDNISolone (SOLU-MEDROL) injection  60 mg Intravenous Q6H  . tamsulosin  0.4 mg Oral QPC breakfast   Continuous Infusions: .  ceFAZolin (ANCEF) IV Stopped (09/17/19 0718)   PRN Meds: acetaminophen **OR** acetaminophen, albuterol, HYDROcodone-acetaminophen, morphine injection  Allergies:    Allergies  Allergen Reactions  . Contrast Media [Iodinated Diagnostic Agents]   . Sulfa Antibiotics     Social History:   Social History   Socioeconomic History  . Marital status: Single    Spouse name: Not on file  . Number of children: Not on file  . Years of education: Not on file  . Highest education level: Not on file  Occupational History  . Not on file  Tobacco Use  . Smoking status: Current Every Day Smoker    Packs/day: 0.50    Years: 50.00    Pack years: 25.00    Types: Cigarettes  . Smokeless tobacco: Never Used  Substance and Sexual Activity  . Alcohol use: Never  . Drug use: Never   . Sexual activity: Not on file  Other Topics Concern  . Not on file  Social History Narrative  . Not on file   Social Determinants of Health   Financial Resource Strain:   . Difficulty of Paying Living Expenses:   Food Insecurity:   . Worried About Charity fundraiser in the Last Year:   . Arboriculturist in the Last Year:   Transportation Needs:   . Film/video editor (Medical):   Marland Kitchen Lack of Transportation (Non-Medical):   Physical Activity:   . Days of Exercise per Week:   . Minutes of Exercise per Session:  Stress:   . Feeling of Stress :   Social Connections:   . Frequency of Communication with Friends and Family:   . Frequency of Social Gatherings with Friends and Family:   . Attends Religious Services:   . Active Member of Clubs or Organizations:   . Attends Archivist Meetings:   Marland Kitchen Marital Status:   Intimate Partner Violence:   . Fear of Current or Ex-Partner:   . Emotionally Abused:   Marland Kitchen Physically Abused:   . Sexually Abused:     Family History:   Family History  Problem Relation Age of Onset  . Heart disease Mother   . Heart disease Father   . Stroke Father    Family Status:  Family Status  Relation Name Status  . Mother  Deceased  . Father  Deceased    ROS:  Please see the history of present illness.  All other ROS reviewed and negative.     Physical Exam/Data:   Vitals:   09/17/19 1050 09/17/19 1055 09/17/19 1100 09/17/19 1101  BP: 115/66 112/65  110/66  Pulse: (!) 53 (!) 51 (!) 51 (!) 50  Resp: 13 15 15 13   Temp: 97.7 F (36.5 C) 98 F (36.7 C)  97.7 F (36.5 C)  TempSrc: Oral Oral  Oral  SpO2: 99% 99% 97% 100%  Weight:      Height:        Intake/Output Summary (Last 24 hours) at 09/17/2019 1146 Last data filed at 09/17/2019 1042 Gross per 24 hour  Intake 565 ml  Output -  Net 565 ml    Last 3 Weights 09/16/2019  Weight (lbs) 190 lb  Weight (kg) 86.183 kg     Body mass index is 25.77 kg/m.   General:  Well  nourished, chronically ill-appearing, male in no acute distress HEENT: normal Lymph: no adenopathy Neck: JVD -no significant elevation Endocrine:  No thryomegaly Vascular: No carotid bruits; 4/4 extremity pulses 2+  Cardiac:  normal S1, S2; RRR but slow; 2-3/6 murmur Lungs: Rales bases bilaterally, mild wheezing, no rhonchi   Abd: soft, nontender, no hepatomegaly  Ext: no edema Musculoskeletal:  No deformities, BUE and BLE strength normal and equal Skin: warm and dry; left leg wound in the knee area is bandaged but still oozing blood, other abrasions are not actively bleeding, some bruises are noted as well Neuro:  CNs 2-12 intact, no focal abnormalities noted Psych:  Normal affect   EKG:  The EKG was personally reviewed and demonstrates: 4/8 ECG is sinus bradycardia with some atrial beats, heart rate 36, right bundle  4/9 ECG is sinus bradycardia, heart rate 51, right bundle 3/5 ECG at [facility] is atrial flutter with variable conduction, right bundle noted, heart rate 93 Telemetry:  Telemetry was personally reviewed and demonstrates: Sinus bradycardia   CV studies:   ECHO: 06/20/2019 at [place]  1. Doppler indices suggest severe low-flow, low-gradient aortic valve stenosis.  2. There is mild aortic regurgitation.  3. The left ventricle is mildly dilated in size with normal wall thickness.  4. The left ventricular systolic function is moderately decreased, LVEF is visually estimated at 40%.  5. The mitral valve leaflets are mildly thickened with normal leaflet mobility.  6. There is moderate mitral valve regurgitation.  7. The left atrium is moderately dilated in size.  8. The right ventricle is mildly dilated in size, with mildly reduced systolic function.  9. The right atrium is moderately dilated in size.  Left Ventricle  The left ventricle is mildly dilated in size with normal wall thickness.  The left ventricular systolic function is  moderately decreased, LVEF is visually estimated at 40%.  Left ventricular diastolic function cannot be accurately assessed.  Right Ventricle  The right ventricle is mildly dilated in size, with mildly reduced systolic function.   Left Atrium  The left atrium is moderately dilated in size.  Right Atrium  The right atrium is moderately dilated in size.   Aortic Valve  The aortic valve is probably trileaflet with moderately thickened leaflets with severely reduced excursion.  There is mild aortic regurgitation.  Doppler indices suggest severe low-flow, low-gradient aortic valve stenosis.  Peak AV transvalvular velocity: 2.4 m/s.  Mean gradient: 16 mmHg.  Doppler velocity index: 0.25.  Estimated aortic valve area (VTI): 0.9 cm2.  LVOT diameter: 2.2 cm.  LV stroke volume index: 26.0 ml/m2.  Pulmonic Valve  The pulmonic valve is poorly visualized, but probably normal.  There is no significant pulmonic regurgitation.  There is no evidence of a significant transvalvular gradient.  Mitral Valve  The mitral valve leaflets are mildly thickened with normal leaflet mobility.  There is moderate mitral valve regurgitation.  Tricuspid Valve  The tricuspid valve leaflets are normal, with normal leaflet mobility.  There is moderate tricuspid regurgitation.  There is mild pulmonary hypertension.  Maximum TR velocity: 2.8 m/s.   Pericardium/Pleural  There is no pericardial effusion.  Inferior Vena Cava  IVC size and inspiratory change suggest elevated right atrial pressure. (10-20 mmHg).  Aorta  The aorta is normal in size in the visualized segments.   Left Ventricular Outflow Tract ---------------------------------------------------------------------- Name                 Value    Normal ----------------------------------------------------------------------  LVOT  2D ---------------------------------------------------------------------- LVOT Diameter            2.2 cm         LVOT Area             3.8 cm2          LVOT Doppler ---------------------------------------------------------------------- LVOT Peak Velocity         0.6 m/s         LVOT VTI               14 cm         LVOT Stroke Volume          53 ml  Pulmonic Valve ---------------------------------------------------------------------- Name                 Value    Normal ----------------------------------------------------------------------  PV Doppler ---------------------------------------------------------------------- PV Peak Velocity          0.9 m/s  Mitral Valve ---------------------------------------------------------------------- Name                 Value    Normal ----------------------------------------------------------------------  MV Diastolic Function ---------------------------------------------------------------------- MV E Peak Velocity        117 cm/s         MV A Peak Velocity         53 cm/s         MV E/A                 2.2          MV Annular TDI ---------------------------------------------------------------------- MV Septal e' Velocity       5.2 cm/s     >=8.0  MV Lateral e' Velocity  9.6 cm/s    >=10.0  MV e' Average             7.4         MV E/e' (Average)           17.3  Tricuspid Valve ---------------------------------------------------------------------- Name                 Value    Normal ----------------------------------------------------------------------  TV Regurgitation Doppler ---------------------------------------------------------------------- TR Peak Velocity            3 m/s          Estimated PAP/RSVP ---------------------------------------------------------------------- RA Pressure             3 mmHg      <=5  RV Systolic Pressure        34 mmHg      <36  Aorta ---------------------------------------------------------------------- Name                 Value    Normal ----------------------------------------------------------------------  Ascending Aorta ---------------------------------------------------------------------- Ao Root Diameter (2D)        3.1 cm         Ao Root Diam Index (2D)     1.5 cm/m2  Aortic Valve ---------------------------------------------------------------------- Name                 Value    Normal ----------------------------------------------------------------------  AV Doppler ---------------------------------------------------------------------- AV Peak Velocity          2.4 m/s         AV Mean Gradient          16 mmHg         AV VTI                50 cm         AV Area (Cont Eq VTI)       0.9 cm2     >=3.0  AV Area Index (Cont Eq VTI)   0.4 cm2/m2         AV Area (Cont Eq Vel)       1.0 cm2         AV Area Index (Cont Eq Vel)   0.5 cm2/m2         AV V1/V2 Ratio            0.25  Ventricles ---------------------------------------------------------------------- Name                 Value    Normal ----------------------------------------------------------------------  LV Dimensions 2D/MM ---------------------------------------------------------------------- IVS Diastolic Thickness (2D)    0.5 cm    0.6-1.0  LVID Diastole (2D)         5.8 cm    7.4-1.2  LVPW Diastolic Thickness (2D)                1.0 cm    0.6-1.0  LVID Systole (2D)           4.5 cm    2.5-4.0  LVOT Diameter            2.2 cm          RV Dimensions 2D/MM ---------------------------------------------------------------------- RV Basal Diastolic Dimension    4.5 cm    2.5-4.1  TAPSE                2.0 cm     >=1.7  Atria ---------------------------------------------------------------------- Name                 Value    Normal ----------------------------------------------------------------------  LA Dimensions ---------------------------------------------------------------------- LA Dimension (2D)          5.3 cm    3.0-4.1  LA Volume (BP MOD)          85 ml         LA Volume Index (BP MOD)    42.39 ml/m2  16.00-34.00   RA Dimensions ---------------------------------------------------------------------- RA Area (4C)           23.3 cm2    <=18.0  RA Area (4C) Index       11.6 cm2/m2  CT cardiac TAVR: 09/09/2019 Vascular access measurements: Left common iliac artery: 10 x 7 mm Left external iliac artery: 9 x 7 cm Right common iliac artery: 10 x 7 mm Right external iliac artery: 8 x 6 mm.  1. Aortic annulus diameter is 27 mm. There is dense calcification of the aortic valve with an Agatston score of 839. 2. Three-vessel coronary artery atherosclerosis. 3. Moderate biatrial dilation. 4. Please see concurrent CTA chest abdomen pelvis for more accurate evaluation of noncardiac findings.  CATH: At [facility], 08/10/2018 Left Heart Catheterization (Left Radial) Under Lidocaine 2% local anesthesia, a 45F Terumo Glidesheath was placed in  the left radial artery using modified Seldinger technique. 5 mg of  verapamil were given via the sheath intra-arterially. A 0.035 Rosen wire  (260cm) was used to guide the advancement of the coronary catheter for  engagement. 4,000 units of heparin were given after the wire and catheter  crossed the  aortic arch into the ascending aorta. Selective coronary  angiography was then performed in multiple views by hand injections of  Omnipaque using a 45F JR4 catheter to engage the right coronary artery and  a 45F JL4 catheter to engage the left coronary artery.  The culprit lesion was identified percutaneous coronary intervention  followed as described below.  Percutaneous Coronary Intervention The decision was made to intervene on the RCA.  PCI: Percutaneous coronary intervention performed of the mRCA  Anticoagulants: Routine, ASA, Clopidogrel, Unfractionated Heparin Guide catheter: JR4  Guide Wire: 0.014" [place] Pre-Dilation Balloon: 2.5 mm  Stent:  Synergy 3.5x16 mm DES Post-Dilation Balloon: N/a Result:  Excellent angiographic result with TIMI 3 flow  Following the procedure, the sheath was removed, and a TR band was applied  to achieve hemostasis. The TR band was subsequently removed with staged  removal of air as per protocol. The patient tolerated the procedure well  without any complications.  IVUS An [place] IVUS catheter was advanced down the wire and used to  visualize the leftand right CIA,EIA, and CFA. There was mild plaquing  throughout the inflow vessel. No significant stenoses noted with CFA  around 7.5-8.71mm  FINDINGS  Hemodynamics and Left Heart Catheterization  Aortic pressure: 105/70 mm Hg (mean 86 mm Hg)  Coronary Angiography  Dominance: Right  Left Main: The left main coronary artery (LMCA) is a large-caliber vessel  that originates from the left coronary sinus. It bifurcates into the left  anterior descending (LAD) and left circumflex (LCx) arteries. There is no  angiographic evidence of significant disease in the LMCA.  LAD: The LAD is a large-caliber vessel that gives off 2 diagonal (D)  branches before it wraps around the apex. High D1 is a large-caliber,  branching vessel. D2 is a moderate-caliber vessel with a 90% stenosis in  the  proximal segment. There is no other angiographic evidence of  significant disease in the LAD.  Left Circumflex: The LCx is a large-caliber vessel that gives  off 3 obtuse  marginal (OM) branches and then continues as a small vessel in the AV  groove. OM1 is a large-caliber vessel. OM2 is a moderate-caliber vessel.  OM3 is a large-caliber vessel. There is no angiographic evidence of  significant disease in the LCx.  Right Coronary: The right coronary artery (RCA) is a large-caliber vessel  originating from the right coronary sinus. It bifurcates distally into a  large-caliber posterior descending artery (PDA) and small posterolateral  (PL) branches consistent with a right dominant system. There is a 90%  stenosis of the mRCA.    Laboratory Data:   Chemistry Recent Labs  Lab 09/16/19 1817 09/16/19 1820 09/17/19 0821  NA 137 136 139  K 4.7 4.4 4.6  CL 103 104 102  CO2 24  --  25  GLUCOSE 134* 122* 137*  BUN 45* 42* 48*  CREATININE 2.87* 3.00* 2.89*  CALCIUM 8.9  --  8.8*  GFRNONAA 22*  --  21*  GFRAA 25*  --  25*  ANIONGAP 10  --  12    Lab Results  Component Value Date   ALT 15 09/16/2019   AST 18 09/16/2019   ALKPHOS 110 09/16/2019   BILITOT 1.1 09/16/2019   Hematology Recent Labs  Lab 09/16/19 1817 09/16/19 1820 09/16/19 2352 09/17/19 0821  WBC 8.3  --   --  8.6  RBC 3.08*  --  3.15* 2.61*  HGB 9.1* 9.9*  --  7.8*  HCT 29.4* 29.0*  --  25.4*  MCV 95.5  --   --  97.3  MCH 29.5  --   --  29.9  MCHC 31.0  --   --  30.7  RDW 22.5*  --   --  22.8*  PLT 120*  --   --  120*   Cardiac Enzymes High Sensitivity Troponin:  No results for input(s): TROPONINIHS in the last 720 hours.    BNPNo results for input(s): BNP, PROBNP in the last 168 hours.  DDimer No results for input(s): DDIMER in the last 168 hours. TSH:  Lab Results  Component Value Date   TSH 3.253 09/16/2019   Lipids:No results found for: CHOL, HDL, LDLCALC, LDLDIRECT, TRIG, CHOLHDL HgbA1c:No  results found for: HGBA1C Magnesium:  Magnesium  Date Value Ref Range Status  09/16/2019 2.2 1.7 - 2.4 mg/dL Final    Comment:    Performed at [facility], [place] [address], [place], [place] [address]     Radiology/Studies:  DG Forearm Left  Result Date: 09/16/2019 CLINICAL DATA:  Status post fall. EXAM: LEFT FOREARM - 2 VIEW COMPARISON:  None. FINDINGS: There is no evidence of fracture or other focal bone lesions. Soft tissues are unremarkable. IMPRESSION: Negative. Electronically Signed   By: Virgina Norfolk M.D.   On: 09/16/2019 19:08   CT Head Wo Contrast  Result Date: 09/16/2019 CLINICAL DATA:  69 year old male with multiple falls today. Positive loss of consciousness. EXAM: CT HEAD WITHOUT CONTRAST TECHNIQUE: Contiguous axial images were obtained from the base of the skull through the vertex without intravenous contrast. COMPARISON:  [facility] 06/15/2007. FINDINGS: Brain: Cerebral volume is within normal limits for age. No midline shift, ventriculomegaly, mass effect, evidence of mass lesion, intracranial hemorrhage or evidence of cortically based acute infarction. Patchy and confluent bilateral cerebral white matter hypodensity is new since 2009, although fairly symmetric. The deep gray nuclei, brainstem and cerebellum remain within normal limits. No cortical encephalomalacia identified. Vascular: Calcified atherosclerosis at the skull base.No  suspicious intracranial vascular hyperdensity. Skull: Intact, negative. Sinuses/Orbits: Visualized paranasal sinuses and mastoids are clear. Other: No acute orbit or scalp soft tissue findings. Postoperative changes to both globes. IMPRESSION: 1. No acute intracranial abnormality or acute traumatic injury identified. 2. 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 Electronically Signed   By: Genevie Ann M.D.   On: 09/16/2019 19:32   CT Cervical Spine Wo  Contrast  Result Date: 09/16/2019 CLINICAL DATA:  69 year old male with multiple falls today. Positive loss of consciousness. EXAM: CT CERVICAL SPINE WITHOUT CONTRAST TECHNIQUE: Multidetector CT imaging of the cervical spine was performed without intravenous contrast. Multiplanar CT image reconstructions were also generated. COMPARISON:  Head CT today reported separately. [facility] cervical spine MRI 07/19/2014. Cervical spine radiographs 02/13/2018. FINDINGS: Alignment: Chronic straightening of cervical lordosis similar to the prior MRI. Mild chronic retrolisthesis at C6-C7. Bilateral posterior element alignment is within normal limits. Skull base and vertebrae: Motion artifact C5 through C7. Visualized skull base is intact. No atlanto-occipital dissociation. C1 and C2 appear intact and normally aligned. No acute osseous abnormality identified. Soft tissues and spinal canal: No prevertebral fluid or swelling. No visible canal hematoma. Bilateral calcified carotid atherosclerosis in the neck. Disc levels: Cervical spine degeneration appears progressed since the 2016 MRI, likely with new or increased upper cervical spine mild degenerative stenosis such as at C2-C3 and C3-C4 (sagittal image 55). Lower cervical spinal stenosis to severe now. At C6-C7 could be moderate Upper chest: Low-density at least moderate size layering pleural effusions in both lung apices. No pneumothorax. Visible upper thoracic levels appear grossly intact. Calcified aortic atherosclerosis. Other: Carious dentition. IMPRESSION: 1. No acute traumatic injury identified in the cervical spine. Mild motion artifact C5 through C7. 2. 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 3. Bilateral layering pleural effusions visible in the lung apices, at least moderate. 4. Aortic Atherosclerosis (ICD10-I70.0). 5. Carious dentition.  Electronically Signed   By: Genevie Ann M.D.   On: 09/16/2019 19:38   CT Knee Left Wo Contrast  Result Date: 09/16/2019 CLINICAL DATA:  Status post fall, question of tibial plateau fracture EXAM: CT OF THE left  KNEE WITHOUT CONTRAST TECHNIQUE: Multidetector CT imaging of the left knee was performed according to the standard protocol. Multiplanar CT image reconstructions were also generated. COMPARISON:  None. FINDINGS: Bones/Joint/Cartilage No definite fracture or dislocation is seen. 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 A tiny amount of suprapatellar fluid is seen. Ligaments Suboptimally assessed by CT. There does appear to be however thickening and edema seen around the lateral patellar retinaculum. Muscles and Tendons The muscles appear to be grossly intact. No focal atrophy or tear. The patellar and quadriceps tendon are intact. Soft tissues Significant overlying subcutaneous edema soft tissue swelling and small foci of subcutaneous emphysema are noted. Along the lateral patellar retinaculum there is a approximately 3 cm soft tissue hematoma. And along the proximal medial tibia there is a small 2 cm soft tissue hematoma. There is also overlying skin laceration seen. Scattered vascular calcifications are noted. IMPRESSION: No definite fracture seen. Edema and thickening seen around the lateral patellar retinaculum, likely due to intrasubstance sprain. Overlying prepatellar laceration with significant subcutaneous edema and small soft tissue hematomas as described above. Electronically Signed   By: Prudencio Pair M.D.   On: 09/16/2019 19:40   US RENAL  Result Date: 09/17/2019 CLINICAL DATA:  Acute renal failure. EXAM: RENAL / URINARY TRACT ULTRASOUND COMPLETE COMPARISON:  CT dated 04/03/2019 FINDINGS: Right Kidney: Renal measurements: 8.6 x 3.9 x 3.4 cm = volume: 60 mL . Echogenicity within normal limits. No mass or hydronephrosis visualized. Left Kidney: Renal  measurements: 12.3 x 5.9 x 5.3 cm = volume: 200 mL. There is a lower pole cyst measuring approximately 1.4 cm. Bladder: The bladder is distended.  The bladder wall is thickened. Other: Bilateral pleural effusions are noted. These appear to be at least moderate size, especially on the right. IMPRESSION: 1. No evidence for hydronephrosis. 2. Incidentally noted bilateral pleural effusions. 3. Distended urinary bladder with bladder wall thickening. Correlation with urinalysis is recommended. Electronically Signed   By: Constance Holster M.D.   On: 09/17/2019 01:03   DG Pelvis Portable  Result Date: 09/16/2019 CLINICAL DATA:  Status post fall. EXAM: PORTABLE PELVIS 1-2 VIEWS COMPARISON:  None. FINDINGS: There is no evidence of an acute pelvic fracture or diastasis. Mild chronic changes are seen along the symphysis pubis. A total right hip replacement is noted. There is no evidence of surrounding lucency to suggest the presence of hardware loosening or infection. Soft tissue structures are unremarkable. IMPRESSION: Intact right hip replacement. Electronically Signed   By: Virgina Norfolk M.D.   On: 09/16/2019 19:12   DG Chest Port 1 View  Result Date: 09/16/2019 CLINICAL DATA:  Status post fall with loss of consciousness. EXAM: PORTABLE CHEST 1 VIEW COMPARISON:  June 15, 2019 FINDINGS: Mild, stable diffusely increased lung markings are seen. Mild atelectasis and/or infiltrate is seen within the retrocardiac region of the left lung base. Small bilateral pleural effusions are noted. There is no evidence of a pneumothorax. The cardiac silhouette is mildly enlarged and unchanged in size. There is moderate severity calcification of the aortic arch. The visualized skeletal structures are unremarkable. IMPRESSION: 1. Mild left basilar atelectasis and/or infiltrate. 2. Small bilateral pleural effusions. Electronically Signed   By: Virgina Norfolk M.D.   On: 09/16/2019 19:07   DG Shoulder Left  Result Date:  09/16/2019 CLINICAL DATA:  Status post fall. EXAM: LEFT SHOULDER - 2+ VIEW COMPARISON:  None. FINDINGS: There is no evidence of acute fracture or dislocation. Moderate severity degenerative changes seen involving the distal left clavicle, left acromioclavicular joint and left acromion. Soft tissues are unremarkable. IMPRESSION: Degenerative changes, without evidence of acute osseous abnormality. Electronically Signed   By: Virgina Norfolk M.D.   On: 09/16/2019 19:14   DG Knee Left Port  Result Date: 09/16/2019 CLINICAL DATA:  Status post fall. EXAM: PORTABLE LEFT KNEE - 1-2 VIEW COMPARISON:  None. FINDINGS: It should be noted that the medial and lateral epicondyles of the distal left femur are limited in evaluation on the frontal view secondary to the presence of an overlying radiopaque bandage. No evidence of acute fracture, dislocation, or joint effusion. No evidence of arthropathy or other focal bone abnormality. There is mild infrapatellar soft tissue swelling. A mild to moderate amount of soft tissue air is also seen within this region. IMPRESSION: 1. Mild to moderate amount of infrapatellar soft tissue swelling and soft tissue air without evidence of acute osseous abnormality. Electronically Signed   By: Virgina Norfolk M.D.   On: 09/16/2019 19:10   DG Tibia/Fibula Left Port  Result Date: 09/16/2019 CLINICAL DATA:  Fall EXAM: PORTABLE LEFT TIBIA AND FIBULA - 2 VIEW COMPARISON:  None. FINDINGS: Infrapatellar soft tissue swelling with soft tissue air again noted as  seen on earlier knee series. No acute fracture, subluxation or dislocation. Old healed distal fibular shaft fracture. IMPRESSION: No acute bony abnormality. Electronically Signed   By: Rolm Baptise M.D.   On: 09/16/2019 19:32    Assessment and Plan:   1.  Symptomatic bradycardia: -He has had problems with atrial flutter and was on amiodarone plus Toprol-XL 50 mg daily -According to Mr. Cobbins, his heart rate has been in the 40s ever  since he was put on the Toprol-XL -Hold the medications, follow heart rate  2.  Paroxysmal atrial flutter -he was in this in January and had a cardioversion. -He was also in this when he went to the hospital for his cath in March, but rate was relatively well controlled - Monitor closely on telemetry  3.  Syncope: -Most likely etiology is a combination of bradycardia and severe AS -Do not think he is dry, if anything, he has some mild volume overload, give Lasix after the transfusion  4.  Severe AS: -According to an echo several months ago, his EF was 40% and he had low flow low output severe AS -Cardiac catheterization in cardiac CT have been performed as part of TAVR work-up but surgery has not been scheduled, he has a follow-up appointment arranged -Check echo and follow for symptoms  5.  CAD: -Cath report from 08/13/2019 as above.  He had DES to the RCA but medical therapy was recommended for a 90% D2 -Currently, his Plavix is on hold.  He was not on aspirin because of the Eliquis -MD advise on restarting the Plavix  Otherwise, per IM Principal Problem:   Symptomatic bradycardia Active Problems:   A-fib (HCC)   QT prolongation   COPD with acute exacerbation (HCC)   AKI (acute kidney injury) ([place])   Knee laceration, left, initial encounter     For questions or updates, please contact [facility] Please consult [email or web address] for contact info under Cardiology/STEMI.   SignedRosaria Ferries, PA-C  09/17/2019 11:46 AM

## 2019-09-17 NOTE — Consult Note (Signed)
ORTHOPAEDIC CONSULTATION  REQUESTING PHYSICIAN: Hosie Poisson, MD  Chief Complaint: Knee pain  HPI: Justin Boone is a 69 y.o. male with past medical history significant for A. fib on Eliquis, CHF, diabetes, hypertension, COPD presenting to the ED for evaluation of head injury and a large laceration to the left knee after a fall. He was feeling lightheaded and dizzy on 09/16/2019. He fell hitting his head and landed on his left side. He sustained a large laceration to the left knee. He was taken to [facility] Emergency Department by EMS. Tourniquet had to be placed above left knee due to bleeding. Laceration was repaired by Dr. Darl Householder in a multilayer fashion. Patient was admitted to the hospitalist service due to symptomatic bradycardia, COPD with acute exacerbation, and AKI vs CKD.   Orthopedics was consulted for left knee laceration. Upon examination, patient was resting in hospital bed. He denies any pain to any other extremity besides his left lower extremity. He reports pain is worse with movement. He has been feeling dizzy and weak this past week, but he does not know why. He takes Eliquis for a.fib. He had a significant bleeding that had soaked through his dressing and onto the bed when I entered the room this morning.   Past Medical History:  Diagnosis Date  . A-fib ([place])   . CHF (congestive heart failure) ([place])    Past Surgical History:  Procedure Laterality Date  . SHOULDER SURGERY     Social History   Socioeconomic History  . Marital status: Single    Spouse name: Not on file  . Number of children: Not on file  . Years of education: Not on file  . Highest education level: Not on file  Occupational History  . Not on file  Tobacco Use  . Smoking status: Current Every Day Smoker    Packs/day: 0.50    Years: 50.00    Pack years: 25.00    Types: Cigarettes  . Smokeless tobacco: Never Used  Substance and Sexual Activity  . Alcohol use: Never  . Drug use: Never  . Sexual  activity: Not on file  Other Topics Concern  . Not on file  Social History Narrative  . Not on file   Social Determinants of Health   Financial Resource Strain:   . Difficulty of Paying Living Expenses:   Food Insecurity:   . Worried About Charity fundraiser in the Last Year:   . Arboriculturist in the Last Year:   Transportation Needs:   . Film/video editor (Medical):   Marland Kitchen Lack of Transportation (Non-Medical):   Physical Activity:   . Days of Exercise per Week:   . Minutes of Exercise per Session:   Stress:   . Feeling of Stress :   Social Connections:   . Frequency of Communication with Friends and Family:   . Frequency of Social Gatherings with Friends and Family:   . Attends Religious Services:   . Active Member of Clubs or Organizations:   . Attends Archivist Meetings:   Marland Kitchen Marital Status:    History reviewed. No pertinent family history. Allergies  Allergen Reactions  . Contrast Media [Iodinated Diagnostic Agents]   . Sulfa Antibiotics    Prior to Admission medications   Medication Sig Start Date End Date Taking? Authorizing Provider  albuterol (PROVENTIL) (2.5 MG/3ML) 0.083% nebulizer solution Inhale 3 mLs into the lungs every 6 (six) hours as needed for shortness of breath  or wheezing. 08/27/19  Yes [provider]  allopurinol (ZYLOPRIM) 300 MG tablet Take 300 mg by mouth daily. 08/20/19  Yes [provider]  amiodarone (PACERONE) 200 MG tablet Take 200 mg by mouth daily. 08/12/19  Yes [provider]  atorvastatin (LIPITOR) 80 MG tablet Take 80 mg by mouth daily. 08/20/19  Yes [provider]  clopidogrel (PLAVIX) 75 MG tablet Take 75 mg by mouth daily. 08/12/19  Yes [provider]  DULoxetine (CYMBALTA) 60 MG capsule Take 60 mg by mouth daily. 07/30/19  Yes [provider]  ELIQUIS 5 MG TABS tablet Take 5 mg by mouth 2 (two) times daily. 08/20/19  Yes [provider]  metoprolol succinate  (TOPROL-XL) 50 MG 24 hr tablet Take 50 mg by mouth daily. 08/20/19  Yes [provider]  tamsulosin (FLOMAX) 0.4 MG CAPS capsule Take 0.4 mg by mouth daily. 07/30/19  Yes [provider]  torsemide (DEMADEX) 20 MG tablet Take 20 mg by mouth daily. 08/27/19  Yes [provider]  TRELEGY ELLIPTA 100-62.5-25 MCG/INH AEPB Inhale 1 puff into the lungs daily. 08/27/19  Yes [provider]   DG Forearm Left  Result Date: 09/16/2019 CLINICAL DATA:  Status post fall. EXAM: LEFT FOREARM - 2 VIEW COMPARISON:  None. FINDINGS: There is no evidence of fracture or other focal bone lesions. Soft tissues are unremarkable. IMPRESSION: Negative. Electronically Signed   By: Virgina Norfolk M.D.   On: 09/16/2019 19:08   CT Head Wo Contrast  Result Date: 09/16/2019 CLINICAL DATA:  69 year old male with multiple falls today. Positive loss of consciousness. EXAM: CT HEAD WITHOUT CONTRAST TECHNIQUE: Contiguous axial images were obtained from the base of the skull through the vertex without intravenous contrast. COMPARISON:  [facility] 06/15/2007. FINDINGS: Brain: Cerebral volume is within normal limits for age. No midline shift, ventriculomegaly, mass effect, evidence of mass lesion, intracranial hemorrhage or evidence of cortically based acute infarction. Patchy and confluent bilateral cerebral white matter hypodensity is new since 2009, although fairly symmetric. The deep gray nuclei, brainstem and cerebellum remain within normal limits. No cortical encephalomalacia identified. Vascular: Calcified atherosclerosis at the skull base.No suspicious intracranial vascular hyperdensity. Skull: Intact, negative. Sinuses/Orbits: Visualized paranasal sinuses and mastoids are clear. Other: No acute orbit or scalp soft tissue findings. Postoperative changes to both globes. IMPRESSION: 1. No acute intracranial abnormality or acute traumatic injury identified. 2. Bilateral cerebral white matter  disease has developed since 2009, most commonly due to chronic small vessel ischemia. Electronically Signed   By: Genevie Ann M.D.   On: 09/16/2019 19:32   CT Cervical Spine Wo Contrast  Result Date: 09/16/2019 CLINICAL DATA:  69 year old male with multiple falls today. Positive loss of consciousness. EXAM: CT CERVICAL SPINE WITHOUT CONTRAST TECHNIQUE: Multidetector CT imaging of the cervical spine was performed without intravenous contrast. Multiplanar CT image reconstructions were also generated. COMPARISON:  Head CT today reported separately. [facility] cervical spine MRI 07/19/2014. Cervical spine radiographs 02/13/2018. FINDINGS: Alignment: Chronic straightening of cervical lordosis similar to the prior MRI. Mild chronic retrolisthesis at C6-C7. Bilateral posterior element alignment is within normal limits. Skull base and vertebrae: Motion artifact C5 through C7. Visualized skull base is intact. No atlanto-occipital dissociation. C1 and C2 appear intact and normally aligned. No acute osseous abnormality identified. Soft tissues and spinal canal: No prevertebral fluid or swelling. No visible canal hematoma. Bilateral calcified carotid atherosclerosis in the neck. Disc levels: Cervical spine degeneration appears progressed since the 2016 MRI, likely  with new or increased upper cervical spine mild degenerative stenosis such as at C2-C3 and C3-C4 (sagittal image 55). Lower cervical spinal stenosis to severe now. At C6-C7 could be moderate Upper chest: Low-density at least moderate size layering pleural effusions in both lung apices. No pneumothorax. Visible upper thoracic levels appear grossly intact. Calcified aortic atherosclerosis. Other: Carious dentition. IMPRESSION: 1. No acute traumatic injury identified in the cervical spine. Mild motion artifact C5 through C7. 2. Chronic cervical spine degeneration appears progressed since a 2016 MRI, with moderate or severe lower cervical spinal stenosis now  possible at C6-C7. 3. Bilateral layering pleural effusions visible in the lung apices, at least moderate. 4. Aortic Atherosclerosis (ICD10-I70.0). 5. Carious dentition. Electronically Signed   By: Genevie Ann M.D.   On: 09/16/2019 19:38   CT Knee Left Wo Contrast  Result Date: 09/16/2019 CLINICAL DATA:  Status post fall, question of tibial plateau fracture EXAM: CT OF THE left  KNEE WITHOUT CONTRAST TECHNIQUE: Multidetector CT imaging of the left knee was performed according to the standard protocol. Multiplanar CT image reconstructions were also generated. COMPARISON:  None. FINDINGS: Bones/Joint/Cartilage No definite fracture or dislocation is seen. 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 A tiny amount of suprapatellar fluid is seen. Ligaments Suboptimally assessed by CT. There does appear to be however thickening and edema seen around the lateral patellar retinaculum. Muscles and Tendons The muscles appear to be grossly intact. No focal atrophy or tear. The patellar and quadriceps tendon are intact. Soft tissues Significant overlying subcutaneous edema soft tissue swelling and small foci of subcutaneous emphysema are noted. Along the lateral patellar retinaculum there is a approximately 3 cm soft tissue hematoma. And along the proximal medial tibia there is a small 2 cm soft tissue hematoma. There is also overlying skin laceration seen. Scattered vascular calcifications are noted. IMPRESSION: No definite fracture seen. Edema and thickening seen around the lateral patellar retinaculum, likely due to intrasubstance sprain. Overlying prepatellar laceration with significant subcutaneous edema and small soft tissue hematomas as described above. Electronically Signed   By: Prudencio Pair M.D.   On: 09/16/2019 19:40   US RENAL  Result Date: 09/17/2019 CLINICAL DATA:  Acute renal failure. EXAM: RENAL / URINARY TRACT ULTRASOUND COMPLETE COMPARISON:  CT dated 04/03/2019  FINDINGS: Right Kidney: Renal measurements: 8.6 x 3.9 x 3.4 cm = volume: 60 mL . Echogenicity within normal limits. No mass or hydronephrosis visualized. Left Kidney: Renal measurements: 12.3 x 5.9 x 5.3 cm = volume: 200 mL. There is a lower pole cyst measuring approximately 1.4 cm. Bladder: The bladder is distended.  The bladder wall is thickened. Other: Bilateral pleural effusions are noted. These appear to be at least moderate size, especially on the right. IMPRESSION: 1. No evidence for hydronephrosis. 2. Incidentally noted bilateral pleural effusions. 3. Distended urinary bladder with bladder wall thickening. Correlation with urinalysis is recommended. Electronically Signed   By: Constance Holster M.D.   On: 09/17/2019 01:03   DG Pelvis Portable  Result Date: 09/16/2019 CLINICAL DATA:  Status post fall. EXAM: PORTABLE PELVIS 1-2 VIEWS COMPARISON:  None. FINDINGS: There is no evidence of an acute pelvic fracture or diastasis. Mild chronic changes are seen along the symphysis pubis. A total right hip replacement is noted. There is no evidence of surrounding lucency to suggest the presence of hardware loosening or infection. Soft tissue structures are unremarkable. IMPRESSION: Intact right hip replacement. Electronically Signed   By: Virgina Norfolk M.D.   On:  09/16/2019 19:12   DG Chest Port 1 View  Result Date: 09/16/2019 CLINICAL DATA:  Status post fall with loss of consciousness. EXAM: PORTABLE CHEST 1 VIEW COMPARISON:  June 15, 2019 FINDINGS: Mild, stable diffusely increased lung markings are seen. Mild atelectasis and/or infiltrate is seen within the retrocardiac region of the left lung base. Small bilateral pleural effusions are noted. There is no evidence of a pneumothorax. The cardiac silhouette is mildly enlarged and unchanged in size. There is moderate severity calcification of the aortic arch. The visualized skeletal structures are unremarkable. IMPRESSION: 1. Mild left basilar atelectasis  and/or infiltrate. 2. Small bilateral pleural effusions. Electronically Signed   By: Virgina Norfolk M.D.   On: 09/16/2019 19:07   DG Shoulder Left  Result Date: 09/16/2019 CLINICAL DATA:  Status post fall. EXAM: LEFT SHOULDER - 2+ VIEW COMPARISON:  None. FINDINGS: There is no evidence of acute fracture or dislocation. Moderate severity degenerative changes seen involving the distal left clavicle, left acromioclavicular joint and left acromion. Soft tissues are unremarkable. IMPRESSION: Degenerative changes, without evidence of acute osseous abnormality. Electronically Signed   By: Virgina Norfolk M.D.   On: 09/16/2019 19:14   DG Knee Left Port  Result Date: 09/16/2019 CLINICAL DATA:  Status post fall. EXAM: PORTABLE LEFT KNEE - 1-2 VIEW COMPARISON:  None. FINDINGS: It should be noted that the medial and lateral epicondyles of the distal left femur are limited in evaluation on the frontal view secondary to the presence of an overlying radiopaque bandage. No evidence of acute fracture, dislocation, or joint effusion. No evidence of arthropathy or other focal bone abnormality. There is mild infrapatellar soft tissue swelling. A mild to moderate amount of soft tissue air is also seen within this region. IMPRESSION: 1. Mild to moderate amount of infrapatellar soft tissue swelling and soft tissue air without evidence of acute osseous abnormality. Electronically Signed   By: Virgina Norfolk M.D.   On: 09/16/2019 19:10   DG Tibia/Fibula Left Port  Result Date: 09/16/2019 CLINICAL DATA:  Fall EXAM: PORTABLE LEFT TIBIA AND FIBULA - 2 VIEW COMPARISON:  None. FINDINGS: Infrapatellar soft tissue swelling with soft tissue air again noted as seen on earlier knee series. No acute fracture, subluxation or dislocation. Old healed distal fibular shaft fracture. IMPRESSION: No acute bony abnormality. Electronically Signed   By: Rolm Baptise M.D.   On: 09/16/2019 19:32   Family History Reviewed and non-contributory,  no pertinent history of problems with bleeding or anesthesia      Review of Systems 14 system ROS conducted and negative except for that noted in HPI  OBJECTIVE  Vitals: Patient Vitals for the past 8 hrs:  BP Pulse Resp SpO2  09/17/19 0800 119/71 (!) 52 -- 95 %  09/17/19 0744 -- -- -- 93 %  09/17/19 0700 137/71 (!) 51 16 93 %  09/17/19 0600 (!) 142/72 (!) 52 19 --  09/17/19 0500 139/72 (!) 50 15 94 %  09/17/19 0400 138/85 (!) 52 17 93 %  09/17/19 0300 140/78 (!) 52 18 96 %  09/17/19 0200 139/75 (!) 51 (!) 21 94 %  09/17/19 0145 136/71 (!) 51 16 98 %   General: Appears older than stated age, uncomfortable Cardiovascular: Warm extremities noted Respiratory: No cyanosis, no use of accessory musculature GI: No organomegaly, abdomen is soft and non-tender Skin: Large left knee abrasion (see media for image), superficial abrasions left shoulder and elbow, bruise left shin Neurologic: Sensation intact distally save for the below mentioned MSK exam  Psychiatric: Appropriate mood and affect Lymphatic: Bilateral lower extremity edema  Musculoskeletal:  BUE: actively moves BUE without pain. Neurovascularly intact. MGQ:QPYPPJKD moves RLE without pain. Neurovascularly intact.  LLE: nontender about left hip, tender throughout left knee - worse laterally, large anterior knee lacertion (see media) has been repaired in ED nylon sutures are visible, incision is clean but continues to bleed, extensor mechanism intact, he can straight leg raise without assistance, ROM of knee not tested due to laceration, intact EHL/TA/GSC, endorses distal sensation, pitting edema, palpable distal pulses   Test Results Imaging CT of left knee: no definite fracture, patella and quad tendon intact  Labs cbc Recent Labs    09/16/19 1817 09/16/19 1817 09/16/19 1820 09/17/19 0821  WBC 8.3  --   --  8.6  HGB 9.1*   < > 9.9* 7.8*  HCT 29.4*   < > 29.0* 25.4*  PLT 120*  --   --  120*   < > = values in this  interval not displayed.    Labs inflam No results for input(s): CRP in the last 72 hours.  Invalid input(s): ESR  Labs coag Recent Labs    09/16/19 1817  INR 2.4*    Recent Labs    09/16/19 1817 09/16/19 1817 09/16/19 1820 09/17/19 0821  NA 137   < > 136 139  K 4.7   < > 4.4 4.6  CL 103   < > 104 102  CO2 24  --   --  25  GLUCOSE 134*   < > 122* 137*  BUN 45*   < > 42* 48*  CREATININE 2.87*   < > 3.00* 2.89*  CALCIUM 8.9  --   --  8.8*   < > = values in this interval not displayed.    ASSESSMENT AND PLAN: 69 y.o. male with the following: Left knee laceration with persistent bleeding  Orthopedics recommends admission to a medical service and we will provide consultation and follow along. Patient has an intact extensor mechanism and CT shows no fracture and intact tendons. Patient's laceration has continued to bleed. Critical care has been consulted for further evaluation of persistent bleeding.   - Recommendations:  - Weight Bearing Status/Activity: WBAT LLE. May use knee immobilizer for comfort. - Laceration - No indication for urgent washout of knee in the OR at this time. Continue to monitor closely. Continue IV antibiotics. May need to transition to oral antibiotics upon discharge. Continue compression wrapping and recommendations from critical care.  - Pain medications: per medicine  Noemi Chapel, PA-C 09/17/2019

## 2019-09-17 NOTE — Progress Notes (Signed)
Bleeding has increased Pressure added at anterior and posterior knee with ABD pads

## 2019-09-17 NOTE — ED Notes (Signed)
This RN requested the provider to provide something stronger for pain control; awaiting further orders.

## 2019-09-17 NOTE — Progress Notes (Signed)
  Echocardiogram 2D Echocardiogram has been performed.  Justin Boone 09/17/2019, 3:27 PM

## 2019-09-17 NOTE — ED Notes (Signed)
RN notified by Dr. Lauralee Evener that pt's left knee was hemorrhaging upon entering to do ortho assessment. MD, RN and NTs removed bandages, cleaned patient, and changed bedding. Verbal order given by Dr. Lauralee Evener for IV morphine and zofran. MD rewrapped knee. Patient given pillow for comfort. Pt instructed to notify nursing staff if knee begins to bleed through bandages again. Staff will continue to monitor pt for bleeding.

## 2019-09-17 NOTE — ED Notes (Signed)
2L [place] applied for decreased SpO2 when pt is sleeping. States he wears CPAP at home. MD made aware, stated she would place orders.

## 2019-09-17 NOTE — Progress Notes (Addendum)
PROGRESS NOTE    Justin Boone  NAT:[id number] DOB: 10-07-1950 DOA: 09/16/2019 PCP: Patient, No Pcp Per   Brief Narrative:  69 year old gentleman prior history of atrial fibrillation on Eliquis, diabetes mellitus, hypertension, COPD, obstructive sleep apnea on CPAP, ongoing tobacco use stage IIIb CKD, atrial fibrillation/flutter s/p cardioversion on amiodarone and Eliquis, chronic systolic heart failure, recent left heart catheterization in the setting of TAVR work-up on 08/12/2019, s/p drug-eluting stent to the LAD on Plavix and Eliquis presents to ED after a fall and left knee laceration.  X-rays of the left knee shows mild to moderate amount of infrapatellar soft tissue swelling and soft tissue air without evidence of acute abnormality.  Due to persistent bleeding orthopedics consulted and suggested to continue with compression wrapping and no indication of urgent washout of the knee in the OR.  Patient started bleeding profusely and persistently from the laceration soaking his dressing, bandages in the bed.  Patient's hemoglobin has dropped from 9.9-7.8 the last 24 hours from profuse bleeding from the left nasal laceration.  Discussed with pharmacy regarding Andexxa suggested that this is not a life-threatening bleeding and his last Eliquis dose was on the morning of 09/16/2019, he is out of the window and has been 24 hours since her last Eliquis dose.  He was given  K Centra and vitamin K respiratory anticoagulation antidote protocol. Patient's admission was changed to progressive unit and PCCM also consulted to see if he needs to be transferred to ICU for closer monitoring and persistent bleeding.    Assessment & Plan:   Principal Problem:   Symptomatic bradycardia Active Problems:   A-fib (HCC)   QT prolongation   COPD with acute exacerbation (HCC)   AKI (acute kidney injury) (HCC)   Knee laceration, left, initial encounter   Left knee laceration with profuse and persistent bleeding which  had led to soaking the dressings and the surrounding padding. Control the bleeding with compression bandages and a dose of Kcentra and vitamin K given per protocol. Transfuse to keep hemoglobin greater than 9. Pain control and further recommendations as per orthopedics Continue with IV cefazolin for empiric antibiotics.    Persistent dizziness symptomatic bradycardia Hold Cardizem and metoprolol for now Cardiology consulted, and will follow up regarding PPM with TAVR, recommended to continue aspirin 81 mg and Plavix 75 mg and hold Eliquis for 1 week until the bleeding stops.. Repeat echocardiogram ordered for further evaluation.   Paroxysmal atrial flutter/fibrillation S/p cardioversion and is currently bradycardic. Anticoagulation on hold due to active bleeding.    Syncope Probably secondary to erythematous and severe aortic stenosis.    Type 2 diabetes mellitus A1c is pending Continue with sliding scale insulin.   Chronic systolic heart failure Repeat echocardiogram has been ordered.    Acute anemia of blood loss from left knee laceration Unknown baseline hemoglobin. Transfuse to keep hemoglobin greater than 9 due to history of coronary artery disease   Obstructive sleep apnea on CPAP at night continue the same.    Essential hypertension Blood pressure parameters are well controlled at this time.   COPD Tobacco abuse Counseled against tobacco use Scattered wheezing anteriorly and posteriorly. Started the patient on IV Solu-Medrol, continue with duo nebs and Pulmicort.     AKI on stage III CKD  Ultrasound of the kidney did not show any hydronephrosis or obstruction. Continue to monitor renal parameters.   DVT prophylaxis: SCDs Code Status: FULL CODE.  Family Communication: none at bedside.  Disposition Plan:  . Patient came  from: Home.             . Anticipated d/c place: pending.  . Barriers to d/c OR conditions which need to be met to effect a  safe d/c: ongoing bleeding from the knee laceration.    Consultants:   Orthopedics  Cardiology  PCCM.     Procedures:  Antimicrobials: cefazolin IV since admission.   Subjective: Pain in the left knee . No chest pain or sob.  No nausea or vomiting or abdominal pain.   Objective: Vitals:   09/17/19 1042 09/17/19 1050 09/17/19 1055 09/17/19 1101  BP: 118/66 115/66 112/65 110/66  Pulse: (!) 56 (!) 53 (!) 51 (!) 50  Resp: 14 13 15 13   Temp: 97.8 F (36.6 C) 97.7 F (36.5 C) 98 F (36.7 C) 97.7 F (36.5 C)  TempSrc: Oral Oral Oral Oral  SpO2:  99% 99% 100%  Weight:      Height:        Intake/Output Summary (Last 24 hours) at 09/17/2019 1102 Last data filed at 09/17/2019 1042 Gross per 24 hour  Intake 565 ml  Output --  Net 565 ml   Filed Weights   09/16/19 1820  Weight: 86.2 kg    Examination:  General exam: Appears calm and comfortable  Respiratory system: Clear to auscultation. Respiratory effort normal. Cardiovascular system: S1 & S2 heard, irregular, bradycardic no pedal edema. Gastrointestinal system: Abdomen is nondistended, soft and nontender. No organomegaly or masses felt. Normal bowel sounds heard. Central nervous system: Alert and oriented. No focal neurological deficits. Extremities: Left lower extremity bandaged, left knee laceration bandaged and soaked with blood Skin left lower extremity bruising seen Psychiatry: Mood & affect appropriate.     Data Reviewed: I have personally reviewed following labs and imaging studies  CBC: Recent Labs  Lab 09/16/19 1817 09/16/19 1820 09/17/19 0821  WBC 8.3  --  8.6  HGB 9.1* 9.9* 7.8*  HCT 29.4* 29.0* 25.4*  MCV 95.5  --  97.3  PLT 120*  --  562*   Basic Metabolic Panel: Recent Labs  Lab 09/16/19 1817 09/16/19 1820 09/16/19 2352 09/17/19 0821  NA 137 136  --  139  K 4.7 4.4  --  4.6  CL 103 104  --  102  CO2 24  --   --  25  GLUCOSE 134* 122*  --  137*  BUN 45* 42*  --  48*  CREATININE  2.87* 3.00*  --  2.89*  CALCIUM 8.9  --   --  8.8*  MG  --   --  2.2  --    GFR: Estimated Creatinine Clearance: 27.2 mL/min (A) (by C-G formula based on SCr of 2.89 mg/dL (H)). Liver Function Tests: Recent Labs  Lab 09/16/19 1817  AST 18  ALT 15  ALKPHOS 110  BILITOT 1.1  PROT 5.7*  ALBUMIN 2.8*   No results for input(s): LIPASE, AMYLASE in the last 168 hours. No results for input(s): AMMONIA in the last 168 hours. Coagulation Profile: Recent Labs  Lab 09/16/19 1817  INR 2.4*   Cardiac Enzymes: No results for input(s): CKTOTAL, CKMB, CKMBINDEX, TROPONINI in the last 168 hours. BNP (last 3 results) No results for input(s): PROBNP in the last 8760 hours. HbA1C: No results for input(s): HGBA1C in the last 72 hours. CBG: Recent Labs  Lab 09/17/19 0132 09/17/19 0800  GLUCAP 106* 130*   Lipid Profile: No results for input(s): CHOL, HDL, LDLCALC, TRIG, CHOLHDL, LDLDIRECT in the last 72  hours. Thyroid Function Tests: Recent Labs    09/16/19 2352  TSH 3.253   Anemia Panel: Recent Labs    09/16/19 2352  VITAMINB12 389  FOLATE 9.7  FERRITIN 96  TIBC 307  IRON 33*  RETICCTPCT 1.6   Sepsis Labs: Recent Labs  Lab 09/16/19 1826 09/16/19 2352  PROCALCITON  --  <0.10  LATICACIDVEN 1.4  --     Recent Results (from the past 240 hour(s))  Respiratory Panel by RT PCR (Flu A&B, Covid) - Nasopharyngeal Swab     Status: None   Collection Time: 09/16/19  8:24 PM   Specimen: Nasopharyngeal Swab  Result Value Ref Range Status   SARS Coronavirus 2 by RT PCR NEGATIVE NEGATIVE Final    Comment: (NOTE) SARS-CoV-2 target nucleic acids are NOT DETECTED. The SARS-CoV-2 RNA is generally detectable in upper respiratoy specimens during the acute phase of infection. The lowest concentration of SARS-CoV-2 viral copies this assay can detect is 131 copies/mL. A negative result does not preclude SARS-Cov-2 infection and should not be used as the sole basis for treatment or other  patient management decisions. A negative result may occur with  improper specimen collection/handling, submission of specimen other than nasopharyngeal swab, presence of viral mutation(s) within the areas targeted by this assay, and inadequate number of viral copies (<131 copies/mL). A negative result must be combined with clinical observations, patient history, and epidemiological information. The expected result is Negative. Fact Sheet for Patients:  PinkCheek.be Fact Sheet for Healthcare Providers:  GravelBags.it This test is not yet ap proved or cleared by the [place] FDA and  has been authorized for detection and/or diagnosis of SARS-CoV-2 by FDA under an Emergency Use Authorization (EUA). This EUA will remain  in effect (meaning this test can be used) for the duration of the COVID-19 declaration under Section 564(b)(1) of the Act, 21 U.S.C. section 360bbb-3(b)(1), unless the authorization is terminated or revoked sooner.    Influenza A by PCR NEGATIVE NEGATIVE Final   Influenza B by PCR NEGATIVE NEGATIVE Final    Comment: (NOTE) The Xpert Xpress SARS-CoV-2/FLU/RSV assay is intended as an aid in  the diagnosis of influenza from Nasopharyngeal swab specimens and  should not be used as a sole basis for treatment. Nasal washings and  aspirates are unacceptable for Xpert Xpress SARS-CoV-2/FLU/RSV  testing. Fact Sheet for Patients: PinkCheek.be Fact Sheet for Healthcare Providers: GravelBags.it This test is not yet approved or cleared by the [place] FDA and  has been authorized for detection and/or diagnosis of SARS-CoV-2 by  FDA under an Emergency Use Authorization (EUA). This EUA will remain  in effect (meaning this test can be used) for the duration of the  Covid-19 declaration under Section 564(b)(1) of the Act, 21  U.S.C. section 360bbb-3(b)(1), unless  the authorization is  terminated or revoked. Performed at [facility], [place] [address] [place], [place] [address]          Radiology Studies: DG Forearm Left  Result Date: 09/16/2019 CLINICAL DATA:  Status post fall. EXAM: LEFT FOREARM - 2 VIEW COMPARISON:  None. FINDINGS: There is no evidence of fracture or other focal bone lesions. Soft tissues are unremarkable. IMPRESSION: Negative. Electronically Signed   By: Virgina Norfolk M.D.   On: 09/16/2019 19:08   CT Head Wo Contrast  Result Date: 09/16/2019 CLINICAL DATA:  69 year old male with multiple falls today. Positive loss of consciousness. EXAM: CT HEAD WITHOUT CONTRAST TECHNIQUE: Contiguous axial images were obtained from the base  of the skull through the vertex without intravenous contrast. COMPARISON:  [facility] 06/15/2007. FINDINGS: Brain: Cerebral volume is within normal limits for age. No midline shift, ventriculomegaly, mass effect, evidence of mass lesion, intracranial hemorrhage or evidence of cortically based acute infarction. Patchy and confluent bilateral cerebral white matter hypodensity is new since 2009, although fairly symmetric. The deep gray nuclei, brainstem and cerebellum remain within normal limits. No cortical encephalomalacia identified. Vascular: Calcified atherosclerosis at the skull base.No suspicious intracranial vascular hyperdensity. Skull: Intact, negative. Sinuses/Orbits: Visualized paranasal sinuses and mastoids are clear. Other: No acute orbit or scalp soft tissue findings. Postoperative changes to both globes. IMPRESSION: 1. No acute intracranial abnormality or acute traumatic injury identified. 2. 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 Electronically Signed   By: Genevie Ann M.D.   On: 09/16/2019 19:32   CT Cervical Spine Wo Contrast  Result Date: 09/16/2019 CLINICAL DATA:  69 year old male with multiple falls  today. Positive loss of consciousness. EXAM: CT CERVICAL SPINE WITHOUT CONTRAST TECHNIQUE: Multidetector CT imaging of the cervical spine was performed without intravenous contrast. Multiplanar CT image reconstructions were also generated. COMPARISON:  Head CT today reported separately. [facility] cervical spine MRI 07/19/2014. Cervical spine radiographs 02/13/2018. FINDINGS: Alignment: Chronic straightening of cervical lordosis similar to the prior MRI. Mild chronic retrolisthesis at C6-C7. Bilateral posterior element alignment is within normal limits. Skull base and vertebrae: Motion artifact C5 through C7. Visualized skull base is intact. No atlanto-occipital dissociation. C1 and C2 appear intact and normally aligned. No acute osseous abnormality identified. Soft tissues and spinal canal: No prevertebral fluid or swelling. No visible canal hematoma. Bilateral calcified carotid atherosclerosis in the neck. Disc levels: Cervical spine degeneration appears progressed since the 2016 MRI, likely with new or increased upper cervical spine mild degenerative stenosis such as at C2-C3 and C3-C4 (sagittal image 55). Lower cervical spinal stenosis to severe now. At C6-C7 could be moderate Upper chest: Low-density at least moderate size layering pleural effusions in both lung apices. No pneumothorax. Visible upper thoracic levels appear grossly intact. Calcified aortic atherosclerosis. Other: Carious dentition. IMPRESSION: 1. No acute traumatic injury identified in the cervical spine. Mild motion artifact C5 through C7. 2. 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 3. Bilateral layering pleural effusions visible in the lung apices, at least moderate. 4. Aortic Atherosclerosis (ICD10-I70.0). 5. Carious dentition. Electronically Signed   By: Genevie Ann M.D.   On: 09/16/2019 19:38   CT Knee Left Wo  Contrast  Result Date: 09/16/2019 CLINICAL DATA:  Status post fall, question of tibial plateau fracture EXAM: CT OF THE left  KNEE WITHOUT CONTRAST TECHNIQUE: Multidetector CT imaging of the left knee was performed according to the standard protocol. Multiplanar CT image reconstructions were also generated. COMPARISON:  None. FINDINGS: Bones/Joint/Cartilage No definite fracture or dislocation is seen. 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 A tiny amount of suprapatellar fluid is seen. Ligaments Suboptimally assessed by CT. There does appear to be however thickening and edema seen around the lateral patellar retinaculum. Muscles and Tendons The muscles appear to be grossly intact. No focal atrophy or tear. The patellar and quadriceps tendon are intact. Soft tissues Significant overlying subcutaneous edema soft tissue swelling and small foci of subcutaneous emphysema are noted. Along the lateral patellar retinaculum there is a approximately 3 cm soft  tissue hematoma. And along the proximal medial tibia there is a small 2 cm soft tissue hematoma. There is also overlying skin laceration seen. Scattered vascular calcifications are noted. IMPRESSION: No definite fracture seen. Edema and thickening seen around the lateral patellar retinaculum, likely due to intrasubstance sprain. Overlying prepatellar laceration with significant subcutaneous edema and small soft tissue hematomas as described above. Electronically Signed   By: Prudencio Pair M.D.   On: 09/16/2019 19:40   US RENAL  Result Date: 09/17/2019 CLINICAL DATA:  Acute renal failure. EXAM: RENAL / URINARY TRACT ULTRASOUND COMPLETE COMPARISON:  CT dated 04/03/2019 FINDINGS: Right Kidney: Renal measurements: 8.6 x 3.9 x 3.4 cm = volume: 60 mL . Echogenicity within normal limits. No mass or hydronephrosis visualized. Left Kidney: Renal measurements: 12.3 x 5.9 x 5.3 cm = volume: 200 mL. There is a lower pole cyst  measuring approximately 1.4 cm. Bladder: The bladder is distended.  The bladder wall is thickened. Other: Bilateral pleural effusions are noted. These appear to be at least moderate size, especially on the right. IMPRESSION: 1. No evidence for hydronephrosis. 2. Incidentally noted bilateral pleural effusions. 3. Distended urinary bladder with bladder wall thickening. Correlation with urinalysis is recommended. Electronically Signed   By: Constance Holster M.D.   On: 09/17/2019 01:03   DG Pelvis Portable  Result Date: 09/16/2019 CLINICAL DATA:  Status post fall. EXAM: PORTABLE PELVIS 1-2 VIEWS COMPARISON:  None. FINDINGS: There is no evidence of an acute pelvic fracture or diastasis. Mild chronic changes are seen along the symphysis pubis. A total right hip replacement is noted. There is no evidence of surrounding lucency to suggest the presence of hardware loosening or infection. Soft tissue structures are unremarkable. IMPRESSION: Intact right hip replacement. Electronically Signed   By: Virgina Norfolk M.D.   On: 09/16/2019 19:12   DG Chest Port 1 View  Result Date: 09/16/2019 CLINICAL DATA:  Status post fall with loss of consciousness. EXAM: PORTABLE CHEST 1 VIEW COMPARISON:  June 15, 2019 FINDINGS: Mild, stable diffusely increased lung markings are seen. Mild atelectasis and/or infiltrate is seen within the retrocardiac region of the left lung base. Small bilateral pleural effusions are noted. There is no evidence of a pneumothorax. The cardiac silhouette is mildly enlarged and unchanged in size. There is moderate severity calcification of the aortic arch. The visualized skeletal structures are unremarkable. IMPRESSION: 1. Mild left basilar atelectasis and/or infiltrate. 2. Small bilateral pleural effusions. Electronically Signed   By: Virgina Norfolk M.D.   On: 09/16/2019 19:07   DG Shoulder Left  Result Date: 09/16/2019 CLINICAL DATA:  Status post fall. EXAM: LEFT SHOULDER - 2+ VIEW COMPARISON:   None. FINDINGS: There is no evidence of acute fracture or dislocation. Moderate severity degenerative changes seen involving the distal left clavicle, left acromioclavicular joint and left acromion. Soft tissues are unremarkable. IMPRESSION: Degenerative changes, without evidence of acute osseous abnormality. Electronically Signed   By: Virgina Norfolk M.D.   On: 09/16/2019 19:14   DG Knee Left Port  Result Date: 09/16/2019 CLINICAL DATA:  Status post fall. EXAM: PORTABLE LEFT KNEE - 1-2 VIEW COMPARISON:  None. FINDINGS: It should be noted that the medial and lateral epicondyles of the distal left femur are limited in evaluation on the frontal view secondary to the presence of an overlying radiopaque bandage. No evidence of acute fracture, dislocation, or joint effusion. No evidence of arthropathy or other focal bone abnormality. There is mild infrapatellar soft tissue swelling. A mild to moderate amount of  soft tissue air is also seen within this region. IMPRESSION: 1. Mild to moderate amount of infrapatellar soft tissue swelling and soft tissue air without evidence of acute osseous abnormality. Electronically Signed   By: Virgina Norfolk M.D.   On: 09/16/2019 19:10   DG Tibia/Fibula Left Port  Result Date: 09/16/2019 CLINICAL DATA:  Fall EXAM: PORTABLE LEFT TIBIA AND FIBULA - 2 VIEW COMPARISON:  None. FINDINGS: Infrapatellar soft tissue swelling with soft tissue air again noted as seen on earlier knee series. No acute fracture, subluxation or dislocation. Old healed distal fibular shaft fracture. IMPRESSION: No acute bony abnormality. Electronically Signed   By: Rolm Baptise M.D.   On: 09/16/2019 19:32        Scheduled Meds: . budesonide (PULMICORT) nebulizer solution  0.25 mg Nebulization BID  . clopidogrel  75 mg Oral Daily  . dextromethorphan-guaiFENesin  1 tablet Oral BID  . DULoxetine  60 mg Oral Daily  . insulin aspart  0-5 Units Subcutaneous QHS  . insulin aspart  0-9 Units  Subcutaneous TID WC  . ipratropium-albuterol  3 mL Nebulization Q6H  . lidocaine-EPINEPHrine  20 mL Intradermal Once  . methylPREDNISolone (SOLU-MEDROL) injection  60 mg Intravenous Q6H  . tamsulosin  0.4 mg Oral Daily   Continuous Infusions: .  ceFAZolin (ANCEF) IV Stopped (09/17/19 1103)  . phytonadione (VITAMIN K) IV 10 mg (09/17/19 1054)     LOS: 1 day       Hosie Poisson, MD [facility]   To contact the attending provider between 7A-7P or the covering provider during after hours 7P-7A, please log into the web site [email or web address] and access using universal [place] password for that web site. If you do not have the password, please call the hospital operator.  09/17/2019, 11:02 AM

## 2019-09-18 ENCOUNTER — Inpatient Hospital Stay (HOSPITAL_COMMUNITY): Payer: Medicare HMO

## 2019-09-18 DIAGNOSIS — I480 Paroxysmal atrial fibrillation: Secondary | ICD-10-CM | POA: Diagnosis not present

## 2019-09-18 DIAGNOSIS — I2510 Atherosclerotic heart disease of native coronary artery without angina pectoris: Secondary | ICD-10-CM | POA: Diagnosis not present

## 2019-09-18 DIAGNOSIS — R001 Bradycardia, unspecified: Secondary | ICD-10-CM | POA: Diagnosis not present

## 2019-09-18 LAB — BASIC METABOLIC PANEL
Anion gap: 9 (ref 5–15)
BUN: 55 mg/dL — ABNORMAL HIGH (ref 8–23)
CO2: 28 mmol/L (ref 22–32)
Calcium: 8.4 mg/dL — ABNORMAL LOW (ref 8.9–10.3)
Chloride: 103 mmol/L (ref 98–111)
Creatinine, Ser: 2.77 mg/dL — ABNORMAL HIGH (ref 0.61–1.24)
GFR calc Af Amer: 26 mL/min — ABNORMAL LOW (ref >60)
GFR calc non Af Amer: 23 mL/min — ABNORMAL LOW (ref >60)
Glucose, Bld: 174 mg/dL — ABNORMAL HIGH (ref 70–99)
Potassium: 4.7 mmol/L (ref 3.5–5.1)
Sodium: 140 mmol/L (ref 135–145)

## 2019-09-18 LAB — CBC
HCT: 24.8 % — ABNORMAL LOW (ref 39.0–52.0)
Hemoglobin: 7.9 g/dL — ABNORMAL LOW (ref 13.0–17.0)
MCH: 28.5 pg (ref 26.0–34.0)
MCHC: 31.9 g/dL (ref 30.0–36.0)
MCV: 89.5 fL (ref 80.0–100.0)
Platelets: 102 10*3/uL — ABNORMAL LOW (ref 150–400)
RBC: 2.77 MIL/uL — ABNORMAL LOW (ref 4.22–5.81)
RDW: 22.2 % — ABNORMAL HIGH (ref 11.5–15.5)
WBC: 9.2 10*3/uL (ref 4.0–10.5)
nRBC: 0.7 % — ABNORMAL HIGH (ref 0.0–0.2)

## 2019-09-18 LAB — GLUCOSE, CAPILLARY
Glucose-Capillary: 197 mg/dL — ABNORMAL HIGH (ref 70–99)
Glucose-Capillary: 176 mg/dL — ABNORMAL HIGH (ref 70–99)
Glucose-Capillary: 141 mg/dL — ABNORMAL HIGH (ref 70–99)
Glucose-Capillary: 148 mg/dL — ABNORMAL HIGH (ref 70–99)

## 2019-09-18 LAB — PREPARE RBC (CROSSMATCH)

## 2019-09-18 LAB — HEMOGLOBIN AND HEMATOCRIT, BLOOD
HCT: 25.5 % — ABNORMAL LOW (ref 39.0–52.0)
HCT: 25.1 % — ABNORMAL LOW (ref 39.0–52.0)
Hemoglobin: 8.3 g/dL — ABNORMAL LOW (ref 13.0–17.0)
Hemoglobin: 7.8 g/dL — ABNORMAL LOW (ref 13.0–17.0)

## 2019-09-18 LAB — HEMOGLOBIN A1C
Hgb A1c MFr Bld: 6 % — ABNORMAL HIGH (ref 4.8–5.6)
Mean Plasma Glucose: 126 mg/dL

## 2019-09-18 MED ORDER — FUROSEMIDE 10 MG/ML IJ SOLN
20.0000 mg | Freq: Once | INTRAMUSCULAR | Status: AC
  Administered 2019-09-19: 20 mg via INTRAVENOUS
  Filled 2019-09-18: qty 2

## 2019-09-18 MED ORDER — SODIUM CHLORIDE 0.9% IV SOLUTION: Freq: Once | INTRAVENOUS | Status: AC

## 2019-09-18 MED ORDER — METHYLPREDNISOLONE SODIUM SUCC 125 MG IJ SOLR
60.0000 mg | Freq: Two times a day (BID) | INTRAMUSCULAR | Status: DC
  Administered 2019-09-19 – 2019-09-21 (×6): 60 mg via INTRAVENOUS
  Filled 2019-09-18 (×6): qty 2

## 2019-09-18 NOTE — Progress Notes (Addendum)
Progress Note  Patient Name: Justin Boone Date of Encounter: 09/18/2019  Primary Cardiologist: Posey Boyer, MD    Subjective   69 year old gentleman with a history of aortic stenosis.  He is scheduled to have TAVR at [facility].  He has severe aortic stenosis with low gradient due to low cardiac output.  He has had some problems with bradycardia while on Toprol and amiodarone.  The Toprol and amiodarone have been discontinued.  He has a history of coronary artery disease and is status post recent stent placement.  He has been restarted on Plavix and aspirin. He has a history of paroxysmal atrial fibrillation.  His Eliquis is on hold ( for his severe knee laceration ) .  He is scheduled to have TAVR in [place] on Wednesday.  Inpatient Medications    Scheduled Meds: . aspirin  81 mg Oral Daily  . budesonide (PULMICORT) nebulizer solution  0.25 mg Nebulization BID  . clopidogrel  75 mg Oral Daily  . dextromethorphan-guaiFENesin  1 tablet Oral BID  . DULoxetine  60 mg Oral Daily  . feeding supplement (GLUCERNA SHAKE)  237 mL Oral TID BM  . insulin aspart  0-5 Units Subcutaneous QHS  . insulin aspart  0-9 Units Subcutaneous TID WC  . ipratropium-albuterol  3 mL Nebulization Q6H  . lidocaine-EPINEPHrine  20 mL Intradermal Once  . methylPREDNISolone (SOLU-MEDROL) injection  60 mg Intravenous Q6H  . multivitamin with minerals  1 tablet Oral Daily  . tamsulosin  0.4 mg Oral QPC breakfast   Continuous Infusions: .  ceFAZolin (ANCEF) IV 2 g (09/18/19 0547)   PRN Meds: acetaminophen **OR** acetaminophen, albuterol, HYDROcodone-acetaminophen, morphine injection   Vital Signs    Vitals:   09/18/19 0718 09/18/19 0807 09/18/19 0900 09/18/19 1000  BP:  95/74  119/80  Pulse:  (!) 58 66 65  Resp:  13 15 14   Temp:  97.8 F (36.6 C)    TempSrc:  Oral    SpO2: 99% 100% 99% 100%  Weight:      Height:        Intake/Output Summary (Last 24 hours) at 09/18/2019 1108 Last data filed at  09/18/2019 0426 Gross per 24 hour  Intake 1280.16 ml  Output 850 ml  Net 430.16 ml   Last 3 Weights 09/18/2019 09/16/2019  Weight (lbs) 211 lb 190 lb  Weight (kg) 95.709 kg 86.183 kg      Telemetry     NSR - Personally Reviewed  ECG     - Personally Reviewed  Physical Exam   GEN:  elderly male,  Appears older than stated age.    Neck: No JVD Cardiac: RRR, 2/6 systolic murmur  Respiratory: Clear to auscultation bilaterally. GI: Soft, nontender, non-distended  MS: No edema; No deformity. Neuro:  Nonfocal  Psych: Normal affect   Labs    High Sensitivity Troponin:  No results for input(s): TROPONINIHS in the last 720 hours.    Chemistry Recent Labs  Lab 09/16/19 1817 09/16/19 1817 09/16/19 1820 09/17/19 0821 09/18/19 0550  NA 137   < > 136 139 140  K 4.7   < > 4.4 4.6 4.7  CL 103   < > 104 102 103  CO2 24  --   --  25 28  GLUCOSE 134*   < > 122* 137* 174*  BUN 45*   < > 42* 48* 55*  CREATININE 2.87*   < > 3.00* 2.89* 2.77*  CALCIUM 8.9  --   --  8.8* 8.4*  PROT 5.7*  --   --   --   --   ALBUMIN 2.8*  --   --   --   --   AST 18  --   --   --   --   ALT 15  --   --   --   --   ALKPHOS 110  --   --   --   --   BILITOT 1.1  --   --   --   --   GFRNONAA 22*  --   --  21* 23*  GFRAA 25*  --   --  25* 26*  ANIONGAP 10  --   --  12 9   < > = values in this interval not displayed.     Hematology Recent Labs  Lab 09/16/19 1817 09/16/19 1820 09/16/19 2352 09/17/19 0821 09/17/19 1554 09/17/19 1819 09/18/19 0049 09/18/19 0550  WBC 8.3  --   --  8.6  --   --   --  9.2  RBC 3.08*  --  3.15* 2.61*  --   --   --  2.77*  HGB 9.1*   < >  --  7.8*   < > 7.4* 8.3* 7.9*  HCT 29.4*   < >  --  25.4*   < > 23.9* 25.5* 24.8*  MCV 95.5  --   --  97.3  --   --   --  89.5  MCH 29.5  --   --  29.9  --   --   --  28.5  MCHC 31.0  --   --  30.7  --   --   --  31.9  RDW 22.5*  --   --  22.8*  --   --   --  22.2*  PLT 120*  --   --  120*  --   --   --  102*   < > = values in  this interval not displayed.    BNPNo results for input(s): BNP, PROBNP in the last 168 hours.   DDimer No results for input(s): DDIMER in the last 168 hours.   Radiology    DG Forearm Left  Result Date: 09/16/2019 CLINICAL DATA:  Status post fall. EXAM: LEFT FOREARM - 2 VIEW COMPARISON:  None. FINDINGS: There is no evidence of fracture or other focal bone lesions. Soft tissues are unremarkable. IMPRESSION: Negative. Electronically Signed   By: Virgina Norfolk M.D.   On: 09/16/2019 19:08   CT Head Wo Contrast  Result Date: 09/16/2019 CLINICAL DATA:  69 year old male with multiple falls today. Positive loss of consciousness. EXAM: CT HEAD WITHOUT CONTRAST TECHNIQUE: Contiguous axial images were obtained from the base of the skull through the vertex without intravenous contrast. COMPARISON:  [facility] 06/15/2007. FINDINGS: Brain: Cerebral volume is within normal limits for age. No midline shift, ventriculomegaly, mass effect, evidence of mass lesion, intracranial hemorrhage or evidence of cortically based acute infarction. Patchy and confluent bilateral cerebral white matter hypodensity is new since 2009, although fairly symmetric. The deep gray nuclei, brainstem and cerebellum remain within normal limits. No cortical encephalomalacia identified. Vascular: Calcified atherosclerosis at the skull base.No suspicious intracranial vascular hyperdensity. Skull: Intact, negative. Sinuses/Orbits: Visualized paranasal sinuses and mastoids are clear. Other: No acute orbit or scalp soft tissue findings. Postoperative changes to both globes. IMPRESSION: 1. No acute intracranial abnormality or acute traumatic injury identified. 2. 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 Electronically Signed   By: Genevie Ann M.D.   On: 09/16/2019 19:32   CT Cervical Spine Wo Contrast  Result Date: 09/16/2019 CLINICAL DATA:  69 year old male  with multiple falls today. Positive loss of consciousness. EXAM: CT CERVICAL SPINE WITHOUT CONTRAST TECHNIQUE: Multidetector CT imaging of the cervical spine was performed without intravenous contrast. Multiplanar CT image reconstructions were also generated. COMPARISON:  Head CT today reported separately. [facility] cervical spine MRI 07/19/2014. Cervical spine radiographs 02/13/2018. FINDINGS: Alignment: Chronic straightening of cervical lordosis similar to the prior MRI. Mild chronic retrolisthesis at C6-C7. Bilateral posterior element alignment is within normal limits. Skull base and vertebrae: Motion artifact C5 through C7. Visualized skull base is intact. No atlanto-occipital dissociation. C1 and C2 appear intact and normally aligned. No acute osseous abnormality identified. Soft tissues and spinal canal: No prevertebral fluid or swelling. No visible canal hematoma. Bilateral calcified carotid atherosclerosis in the neck. Disc levels: Cervical spine degeneration appears progressed since the 2016 MRI, likely with new or increased upper cervical spine mild degenerative stenosis such as at C2-C3 and C3-C4 (sagittal image 55). Lower cervical spinal stenosis to severe now. At C6-C7 could be moderate Upper chest: Low-density at least moderate size layering pleural effusions in both lung apices. No pneumothorax. Visible upper thoracic levels appear grossly intact. Calcified aortic atherosclerosis. Other: Carious dentition. IMPRESSION: 1. No acute traumatic injury identified in the cervical spine. Mild motion artifact C5 through C7. 2. 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 3. Bilateral layering pleural effusions visible in the lung apices, at least moderate. 4. Aortic Atherosclerosis (ICD10-I70.0). 5. Carious dentition. Electronically Signed   By: Genevie Ann M.D.   On: 09/16/2019 19:38   CT Knee  Left Wo Contrast  Result Date: 09/16/2019 CLINICAL DATA:  Status post fall, question of tibial plateau fracture EXAM: CT OF THE left  KNEE WITHOUT CONTRAST TECHNIQUE: Multidetector CT imaging of the left knee was performed according to the standard protocol. Multiplanar CT image reconstructions were also generated. COMPARISON:  None. FINDINGS: Bones/Joint/Cartilage No definite fracture or dislocation is seen. 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 A tiny amount of suprapatellar fluid is seen. Ligaments Suboptimally assessed by CT. There does appear to be however thickening and edema seen around the lateral patellar retinaculum. Muscles and Tendons The muscles appear to be grossly intact. No focal atrophy or tear. The patellar and quadriceps tendon are intact. Soft tissues Significant overlying subcutaneous edema soft tissue swelling and small foci of subcutaneous emphysema are noted. Along the lateral patellar retinaculum there is a approximately 3 cm soft tissue hematoma. And along the proximal medial tibia there is a small 2 cm soft tissue hematoma. There is also overlying skin laceration seen. Scattered vascular calcifications are noted. IMPRESSION: No definite fracture seen. Edema and thickening seen around the lateral patellar retinaculum, likely due to intrasubstance sprain. Overlying prepatellar laceration with significant subcutaneous edema and small soft tissue hematomas as described above. Electronically Signed   By: Prudencio Pair M.D.   On: 09/16/2019 19:40   US RENAL  Result Date: 09/17/2019 CLINICAL DATA:  Acute renal failure. EXAM: RENAL / URINARY TRACT ULTRASOUND COMPLETE COMPARISON:  CT dated 04/03/2019 FINDINGS: Right Kidney: Renal measurements: 8.6 x 3.9 x 3.4 cm = volume: 60 mL . Echogenicity within normal limits. No mass or hydronephrosis visualized. Left  Kidney: Renal measurements: 12.3 x 5.9 x 5.3 cm = volume: 200 mL. There is a lower pole cyst  measuring approximately 1.4 cm. Bladder: The bladder is distended.  The bladder wall is thickened. Other: Bilateral pleural effusions are noted. These appear to be at least moderate size, especially on the right. IMPRESSION: 1. No evidence for hydronephrosis. 2. Incidentally noted bilateral pleural effusions. 3. Distended urinary bladder with bladder wall thickening. Correlation with urinalysis is recommended. Electronically Signed   By: Constance Holster M.D.   On: 09/17/2019 01:03   DG Pelvis Portable  Result Date: 09/16/2019 CLINICAL DATA:  Status post fall. EXAM: PORTABLE PELVIS 1-2 VIEWS COMPARISON:  None. FINDINGS: There is no evidence of an acute pelvic fracture or diastasis. Mild chronic changes are seen along the symphysis pubis. A total right hip replacement is noted. There is no evidence of surrounding lucency to suggest the presence of hardware loosening or infection. Soft tissue structures are unremarkable. IMPRESSION: Intact right hip replacement. Electronically Signed   By: Virgina Norfolk M.D.   On: 09/16/2019 19:12   DG Chest Port 1 View  Result Date: 09/16/2019 CLINICAL DATA:  Status post fall with loss of consciousness. EXAM: PORTABLE CHEST 1 VIEW COMPARISON:  June 15, 2019 FINDINGS: Mild, stable diffusely increased lung markings are seen. Mild atelectasis and/or infiltrate is seen within the retrocardiac region of the left lung base. Small bilateral pleural effusions are noted. There is no evidence of a pneumothorax. The cardiac silhouette is mildly enlarged and unchanged in size. There is moderate severity calcification of the aortic arch. The visualized skeletal structures are unremarkable. IMPRESSION: 1. Mild left basilar atelectasis and/or infiltrate. 2. Small bilateral pleural effusions. Electronically Signed   By: Virgina Norfolk M.D.   On: 09/16/2019 19:07   DG Shoulder Left  Result Date: 09/16/2019 CLINICAL DATA:  Status post fall. EXAM: LEFT SHOULDER - 2+ VIEW COMPARISON:   None. FINDINGS: There is no evidence of acute fracture or dislocation. Moderate severity degenerative changes seen involving the distal left clavicle, left acromioclavicular joint and left acromion. Soft tissues are unremarkable. IMPRESSION: Degenerative changes, without evidence of acute osseous abnormality. Electronically Signed   By: Virgina Norfolk M.D.   On: 09/16/2019 19:14   DG Knee Left Port  Result Date: 09/16/2019 CLINICAL DATA:  Status post fall. EXAM: PORTABLE LEFT KNEE - 1-2 VIEW COMPARISON:  None. FINDINGS: It should be noted that the medial and lateral epicondyles of the distal left femur are limited in evaluation on the frontal view secondary to the presence of an overlying radiopaque bandage. No evidence of acute fracture, dislocation, or joint effusion. No evidence of arthropathy or other focal bone abnormality. There is mild infrapatellar soft tissue swelling. A mild to moderate amount of soft tissue air is also seen within this region. IMPRESSION: 1. Mild to moderate amount of infrapatellar soft tissue swelling and soft tissue air without evidence of acute osseous abnormality. Electronically Signed   By: Virgina Norfolk M.D.   On: 09/16/2019 19:10   DG Tibia/Fibula Left Port  Result Date: 09/16/2019 CLINICAL DATA:  Fall EXAM: PORTABLE LEFT TIBIA AND FIBULA - 2 VIEW COMPARISON:  None. FINDINGS: Infrapatellar soft tissue swelling with soft tissue air again noted as seen on earlier knee series. No acute fracture, subluxation or dislocation. Old healed distal fibular shaft fracture. IMPRESSION: No acute bony abnormality. Electronically Signed   By: Rolm Baptise M.D.   On: 09/16/2019 19:32    Cardiac Studies     Patient Profile  69 y.o. male   Assessment & Plan    1.  Aortic stenosis: The patient is scheduled to have TAVR at [facility] on Wednesday. We have restarted his aspirin and Plavix for his coronary artery disease and stenting last week. Eliquis is still on hold from his  knee laceration and we will continue to hold given his scheduled surgery in several days. He overall seems to be doing well.  2.  Coronary artery disease: He is not had any episodes of chest pain.  Aspirin and Plavix were held because of his severe knee laceration and will restart at this morning.  3.  Paroxysmal atrial fibrillation: H is maintaining normal sinus rhythm for now.  Eliquis has been on hold because of his knee laceration we will continue to hold given his upcoming TAVR in 4 days.  4.  Symptomatic bradycardia:   Better after DC of metoprolol and amiodarone     For questions or updates, please contact [place] Please consult [email or web address] for contact info under        Signed, Mertie Moores, MD  09/18/2019, 11:08 AM

## 2019-09-18 NOTE — Plan of Care (Signed)
  Problem: Education: Goal: Understanding of CV disease, CV risk reduction, and recovery process will improve Outcome: Progressing Goal: Individualized Educational Video(s) Outcome: Progressing   Problem: Activity: Goal: Ability to return to baseline activity level will improve Outcome: Progressing   Problem: Cardiovascular: Goal: Ability to achieve and maintain adequate cardiovascular perfusion will improve Outcome: Progressing Goal: Vascular access site(s) Level 0-1 will be maintained Outcome: Progressing   Problem: Health Behavior/Discharge Planning: Goal: Ability to safely manage health-related needs after discharge will improve Outcome: Progressing   Problem: Education: Goal: Knowledge of disease or condition will improve Outcome: Progressing Goal: Knowledge of the prescribed therapeutic regimen will improve Outcome: Progressing Goal: Individualized Educational Video(s) Outcome: Progressing   Problem: Activity: Goal: Ability to tolerate increased activity will improve Outcome: Progressing Goal: Will verbalize the importance of balancing activity with adequate rest periods Outcome: Progressing   Problem: Respiratory: Goal: Ability to maintain a clear airway will improve Outcome: Progressing Goal: Levels of oxygenation will improve Outcome: Progressing Goal: Ability to maintain adequate ventilation will improve Outcome: Progressing   Problem: Education: Goal: Knowledge of General Education information will improve Description: Including pain rating scale, medication(s)/side effects and non-pharmacologic comfort measures Outcome: Progressing   Problem: Health Behavior/Discharge Planning: Goal: Ability to manage health-related needs will improve Outcome: Progressing   Problem: Clinical Measurements: Goal: Ability to maintain clinical measurements within normal limits will improve Outcome: Progressing Goal: Will remain free from infection Outcome:  Progressing Goal: Diagnostic test results will improve Outcome: Progressing Goal: Respiratory complications will improve Outcome: Progressing Goal: Cardiovascular complication will be avoided Outcome: Progressing   Problem: Activity: Goal: Risk for activity intolerance will decrease Outcome: Progressing   Problem: Nutrition: Goal: Adequate nutrition will be maintained Outcome: Progressing   Problem: Coping: Goal: Level of anxiety will decrease Outcome: Progressing   Problem: Elimination: Goal: Will not experience complications related to bowel motility Outcome: Progressing Goal: Will not experience complications related to urinary retention Outcome: Progressing   Problem: Pain Managment: Goal: General experience of comfort will improve Outcome: Progressing   Problem: Safety: Goal: Ability to remain free from injury will improve Outcome: Progressing   Problem: Skin Integrity: Goal: Risk for impaired skin integrity will decrease Outcome: Progressing   

## 2019-09-18 NOTE — Progress Notes (Addendum)
PROGRESS NOTE    Justin Boone  JME:[id number] DOB: [birth year] DOA: 09/16/2019 PCP: Patient, No Pcp Per   Brief Narrative:  69 year old gentleman prior history of atrial fibrillation on Eliquis, diabetes mellitus, hypertension, COPD, obstructive sleep apnea on CPAP, ongoing tobacco use stage IIIb CKD, atrial fibrillation/flutter s/p cardioversion on amiodarone and Eliquis, chronic systolic heart failure, recent left heart catheterization in the setting of TAVR work-up on 08/12/2019, s/p drug-eluting stent to the LAD on Plavix and Eliquis presents to ED after a fall and left knee laceration.  X-rays of the left knee shows mild to moderate amount of infrapatellar soft tissue swelling and soft tissue air without evidence of acute abnormality.  Due to persistent bleeding orthopedics consulted and suggested to continue with compression wrapping and no indication of urgent washout of the knee in the OR.  Patient started bleeding profusely and persistently from the laceration soaking his dressing, bandages in the bed.  Patient's hemoglobin has dropped from 9.9-7.8 the last 24 hours from profuse bleeding from the left nasal laceration.  Discussed with pharmacy regarding Andexxa suggested that this is not a life-threatening bleeding and his last Eliquis dose was on the morning of 09/16/2019, he is out of the window and has been 24 hours since her last Eliquis dose.  He was given  K Centra and vitamin K respiratory anticoagulation antidote protocol. Patient's admission was changed to progressive unit and PCCM also consulted to see if he needs to be transferred to ICU for closer monitoring and persistent bleeding. Patient's bleeding has stopped this morning. Patient seen and examined at bedside he still has significant pain in the left lower extremity but is able to move his foot and has palpable dorsalis pedis artery pulse.    Assessment & Plan:   Principal Problem:   Symptomatic bradycardia Active Problems:    A-fib (HCC)   QT prolongation   COPD with acute exacerbation (HCC)   AKI (acute kidney injury) (HCC)   Knee laceration, left, initial encounter   Left knee laceration with profuse and persistent bleeding which had led to soaking the dressings and the surrounding padding. Control the bleeding with compression bandages and a dose of Kcentra and vitamin K given per protocol.  Bleeding has improved Transfuse to keep hemoglobin greater than 7 Pain control and further recommendations as per orthopedics Continue with IV cefazolin for empiric antibiotics.    Persistent dizziness, symptomatic bradycardia Have improved on holding Cardizem and metoprolol Cardiology consulted, and will follow up regarding PPM with TAVR, recommended to continue aspirin 81 mg and Plavix 75 mg and hold Eliquis in preparation for TAVR next week . Repeat echocardiogram is pending   Paroxysmal atrial flutter/fibrillation S/p cardioversion, rate controlled Anticoagulation on hold due to active bleeding.    Syncope Probably secondary to bradycardia, aortic stenosis.   Type 2 diabetes mellitus  CBG (last 3)  Recent Labs    09/18/19 0615 09/18/19 1128 09/18/19 1616  GLUCAP 197* 176* 141*    Hemoglobin A1c is 6 Continue with sliding scale insulin.   Chronic systolic heart failure Repeat echocardiogram has been ordered.    Acute anemia of blood loss from left knee laceration Unknown baseline hemoglobin. Transfuse 2 units of PRBC today and recheck CBC in the morning   Obstructive sleep apnea on CPAP at night continue the same.    Essential hypertension Control blood pressures.   COPD Tobacco abuse Counseled against tobacco use Scattered expiratory wheezing posteriorly, wean IV steroids to 60 every 12.  And continue  with bronchodilators.     AKI on stage III CKD Ultrasound of the kidney did not show any hydronephrosis or obstruction. Creatinine at 2.7 this morning.   DVT prophylaxis:  SCDs Code Status: FULL CODE.  Family Communication: none at bedside.  Disposition Plan:  . Patient came from: Home.             . Anticipated d/c place: pending.  . Barriers to d/c OR conditions which need to be met to effect a safe d/c: Ongoing treatment for knee laceration requiring blood transfusions today   Consultants:   Orthopedics  Cardiology  PCCM.     Procedures:  Antimicrobials: cefazolin IV since admission.   Subjective: Stent pain in the left knee but better than yesterday.  Patient denies any chest pain or shortness of breath, no nausea vomiting or abdominal pain  Objective: Vitals:   09/18/19 1221 09/18/19 1410 09/18/19 1541 09/18/19 1614  BP: 119/80  118/73 121/69  Pulse: 72  74 75  Resp: _0 Temp: 98.7 F (37.1 C)  98.3 F (36.8 C) 98.3 F (36.8 C)  TempSrc: Oral  Oral Oral  SpO2: 98% 98% 98% 94%  Weight:      Height:        Intake/Output Summary (Last 24 hours) at 09/18/2019 1710 Last data filed at 09/18/2019 1559 Gross per 24 hour  Intake 744.67 ml  Output 1100 ml  Net -355.33 ml   Filed Weights   09/16/19 1820 09/18/19 0426  Weight: 86.2 kg 95.7 kg    Examination:  General exam:MILD DISCOMFORT in the left leg.  Respiratory system: Scattered is expiratory wheezing both anteriorly and posteriorly Cardiovascular system: S1 & S2 heard, no JVD irregularly irregular. No pedal edema. Gastrointestinal system: Abdomen is soft, nontender bowel sounds normal Central nervous system: Alert and oriented, grossly nonfocal Extremities: Left knee bandaged Skin: Bruising over the lateral aspect of the left leg Psychiatry: Mood is appropriate    Data Reviewed: I have personally reviewed following labs and imaging studies  CBC: Recent Labs  Lab 09/16/19 1817 09/16/19 1820 09/17/19 0821 09/17/19 0821 09/17/19 1554 09/17/19 1819 09/18/19 0049 09/18/19 0550 09/18/19 1230  WBC 8.3  --  8.6  --   --   --   --  9.2  --   HGB 9.1*   < >  7.8*   < > 7.3* 7.4* 8.3* 7.9* 7.8*  HCT 29.4*   < > 25.4*   < > 23.7* 23.9* 25.5* 24.8* 25.1*  MCV 95.5  --  97.3  --   --   --   --  89.5  --   PLT 120*  --  120*  --   --   --   --  102*  --    < > = values in this interval not displayed.   Basic Metabolic Panel: Recent Labs  Lab 09/16/19 1817 09/16/19 1820 09/16/19 2352 09/17/19 0821 09/18/19 0550  NA 137 136  --  139 140  K 4.7 4.4  --  4.6 4.7  CL 103 104  --  102 103  CO2 24  --   --  25 28  GLUCOSE 134* 122*  --  137* 174*  BUN 45* 42*  --  48* 55*  CREATININE 2.87* 3.00*  --  2.89* 2.77*  CALCIUM 8.9  --   --  8.8* 8.4*  MG  --   --  2.2  --   --  GFR: Estimated Creatinine Clearance: 31 mL/min (A) (by C-G formula based on SCr of 2.77 mg/dL (H)). Liver Function Tests: Recent Labs  Lab 09/16/19 1817  AST 18  ALT 15  ALKPHOS 110  BILITOT 1.1  PROT 5.7*  ALBUMIN 2.8*   No results for input(s): LIPASE, AMYLASE in the last 168 hours. No results for input(s): AMMONIA in the last 168 hours. Coagulation Profile: Recent Labs  Lab 09/16/19 1817 09/17/19 1200  INR 2.4* 1.7*   Cardiac Enzymes: No results for input(s): CKTOTAL, CKMB, CKMBINDEX, TROPONINI in the last 168 hours. BNP (last 3 results) No results for input(s): PROBNP in the last 8760 hours. HbA1C: Recent Labs    09/16/19 2352  HGBA1C 6.0*   CBG: Recent Labs  Lab 09/17/19 2100 09/17/19 2124 09/18/19 0615 09/18/19 1128 09/18/19 1616  GLUCAP 169* 169* 197* 176* 141*   Lipid Profile: No results for input(s): CHOL, HDL, LDLCALC, TRIG, CHOLHDL, LDLDIRECT in the last 72 hours. Thyroid Function Tests: Recent Labs    09/16/19 2352  TSH 3.253   Anemia Panel: Recent Labs    09/16/19 2352  VITAMINB12 389  FOLATE 9.7  FERRITIN 96  TIBC 307  IRON 33*  RETICCTPCT 1.6   Sepsis Labs: Recent Labs  Lab 09/16/19 1826 09/16/19 2352  PROCALCITON  --  <0.10  LATICACIDVEN 1.4  --     Recent Results (from the past 240 hour(s))   Respiratory Panel by RT PCR (Flu A&B, Covid) - Nasopharyngeal Swab     Status: None   Collection Time: 09/16/19  8:24 PM   Specimen: Nasopharyngeal Swab  Result Value Ref Range Status   SARS Coronavirus 2 by RT PCR NEGATIVE NEGATIVE Final    Comment: (NOTE) SARS-CoV-2 target nucleic acids are NOT DETECTED. The SARS-CoV-2 RNA is generally detectable in upper respiratoy specimens during the acute phase of infection. The lowest concentration of SARS-CoV-2 viral copies this assay can detect is 131 copies/mL. A negative result does not preclude SARS-Cov-2 infection and should not be used as the sole basis for treatment or other patient management decisions. A negative result may occur with  improper specimen collection/handling, submission of specimen other than nasopharyngeal swab, presence of viral mutation(s) within the areas targeted by this assay, and inadequate number of viral copies (<131 copies/mL). A negative result must be combined with clinical observations, patient history, and epidemiological information. The expected result is Negative. Fact Sheet for Patients:  PinkCheek.be Fact Sheet for Healthcare Providers:  GravelBags.it This test is not yet ap proved or cleared by the [place] FDA and  has been authorized for detection and/or diagnosis of SARS-CoV-2 by FDA under an Emergency Use Authorization (EUA). This EUA will remain  in effect (meaning this test can be used) for the duration of the COVID-19 declaration under Section 564(b)(1) of the Act, 21 U.S.C. section 360bbb-3(b)(1), unless the authorization is terminated or revoked sooner.    Influenza A by PCR NEGATIVE NEGATIVE Final   Influenza B by PCR NEGATIVE NEGATIVE Final    Comment: (NOTE) The Xpert Xpress SARS-CoV-2/FLU/RSV assay is intended as an aid in  the diagnosis of influenza from Nasopharyngeal swab specimens and  should not be used as a sole  basis for treatment. Nasal washings and  aspirates are unacceptable for Xpert Xpress SARS-CoV-2/FLU/RSV  testing. Fact Sheet for Patients: PinkCheek.be Fact Sheet for Healthcare Providers: GravelBags.it This test is not yet approved or cleared by the [place] FDA and  has been authorized for detection and/or diagnosis of  SARS-CoV-2 by  FDA under an Emergency Use Authorization (EUA). This EUA will remain  in effect (meaning this test can be used) for the duration of the  Covid-19 declaration under Section 564(b)(1) of the Act, 21  U.S.C. section 360bbb-3(b)(1), unless the authorization is  terminated or revoked. Performed at [facility], [address], [place], [place] [address]          Radiology Studies: DG Forearm Left  Result Date: 09/16/2019 CLINICAL DATA:  Status post fall. EXAM: LEFT FOREARM - 2 VIEW COMPARISON:  None. FINDINGS: There is no evidence of fracture or other focal bone lesions. Soft tissues are unremarkable. IMPRESSION: Negative. Electronically Signed   By: Virgina Norfolk M.D.   On: 09/16/2019 19:08   CT Head Wo Contrast  Result Date: 09/16/2019 CLINICAL DATA:  69 year old male with multiple falls today. Positive loss of consciousness. EXAM: CT HEAD WITHOUT CONTRAST TECHNIQUE: Contiguous axial images were obtained from the base of the skull through the vertex without intravenous contrast. COMPARISON:  [facility] 06/15/2007. FINDINGS: Brain: Cerebral volume is within normal limits for age. No midline shift, ventriculomegaly, mass effect, evidence of mass lesion, intracranial hemorrhage or evidence of cortically based acute infarction. Patchy and confluent bilateral cerebral white matter hypodensity is new since 2009, although fairly symmetric. The deep gray nuclei, brainstem and cerebellum remain within normal limits. No cortical encephalomalacia identified. Vascular: Calcified  atherosclerosis at the skull base.No suspicious intracranial vascular hyperdensity. Skull: Intact, negative. Sinuses/Orbits: Visualized paranasal sinuses and mastoids are clear. Other: No acute orbit or scalp soft tissue findings. Postoperative changes to both globes. IMPRESSION: 1. No acute intracranial abnormality or acute traumatic injury identified. 2. 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 Electronically Signed   By: Genevie Ann M.D.   On: 09/16/2019 19:32   CT Cervical Spine Wo Contrast  Result Date: 09/16/2019 CLINICAL DATA:  69 year old male with multiple falls today. Positive loss of consciousness. EXAM: CT CERVICAL SPINE WITHOUT CONTRAST TECHNIQUE: Multidetector CT imaging of the cervical spine was performed without intravenous contrast. Multiplanar CT image reconstructions were also generated. COMPARISON:  Head CT today reported separately. [facility] cervical spine MRI 07/19/2014. Cervical spine radiographs 02/13/2018. FINDINGS: Alignment: Chronic straightening of cervical lordosis similar to the prior MRI. Mild chronic retrolisthesis at C6-C7. Bilateral posterior element alignment is within normal limits. Skull base and vertebrae: Motion artifact C5 through C7. Visualized skull base is intact. No atlanto-occipital dissociation. C1 and C2 appear intact and normally aligned. No acute osseous abnormality identified. Soft tissues and spinal canal: No prevertebral fluid or swelling. No visible canal hematoma. Bilateral calcified carotid atherosclerosis in the neck. Disc levels: Cervical spine degeneration appears progressed since the 2016 MRI, likely with new or increased upper cervical spine mild degenerative stenosis such as at C2-C3 and C3-C4 (sagittal image 55). Lower cervical spinal stenosis to severe now. At C6-C7 could be moderate Upper chest: Low-density at least moderate size layering pleural effusions in both  lung apices. No pneumothorax. Visible upper thoracic levels appear grossly intact. Calcified aortic atherosclerosis. Other: Carious dentition. IMPRESSION: 1. No acute traumatic injury identified in the cervical spine. Mild motion artifact C5 through C7. 2. 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 3. Bilateral layering pleural effusions visible in the lung apices, at least moderate. 4. Aortic Atherosclerosis (ICD10-I70.0). 5. Carious dentition. Electronically Signed  By: Genevie Ann M.D.   On: 09/16/2019 19:38   CT Knee Left Wo Contrast  Result Date: 09/16/2019 CLINICAL DATA:  Status post fall, question of tibial plateau fracture EXAM: CT OF THE left  KNEE WITHOUT CONTRAST TECHNIQUE: Multidetector CT imaging of the left knee was performed according to the standard protocol. Multiplanar CT image reconstructions were also generated. COMPARISON:  None. FINDINGS: Bones/Joint/Cartilage No definite fracture or dislocation is seen. 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 A tiny amount of suprapatellar fluid is seen. Ligaments Suboptimally assessed by CT. There does appear to be however thickening and edema seen around the lateral patellar retinaculum. Muscles and Tendons The muscles appear to be grossly intact. No focal atrophy or tear. The patellar and quadriceps tendon are intact. Soft tissues Significant overlying subcutaneous edema soft tissue swelling and small foci of subcutaneous emphysema are noted. Along the lateral patellar retinaculum there is a approximately 3 cm soft tissue hematoma. And along the proximal medial tibia there is a small 2 cm soft tissue hematoma. There is also overlying skin laceration seen. Scattered vascular calcifications are noted. IMPRESSION: No definite fracture seen. Edema and thickening seen around the lateral patellar retinaculum,  likely due to intrasubstance sprain. Overlying prepatellar laceration with significant subcutaneous edema and small soft tissue hematomas as described above. Electronically Signed   By: Prudencio Pair M.D.   On: 09/16/2019 19:40   US RENAL  Result Date: 09/17/2019 CLINICAL DATA:  Acute renal failure. EXAM: RENAL / URINARY TRACT ULTRASOUND COMPLETE COMPARISON:  CT dated 04/03/2019 FINDINGS: Right Kidney: Renal measurements: 8.6 x 3.9 x 3.4 cm = volume: 60 mL . Echogenicity within normal limits. No mass or hydronephrosis visualized. Left Kidney: Renal measurements: 12.3 x 5.9 x 5.3 cm = volume: 200 mL. There is a lower pole cyst measuring approximately 1.4 cm. Bladder: The bladder is distended.  The bladder wall is thickened. Other: Bilateral pleural effusions are noted. These appear to be at least moderate size, especially on the right. IMPRESSION: 1. No evidence for hydronephrosis. 2. Incidentally noted bilateral pleural effusions. 3. Distended urinary bladder with bladder wall thickening. Correlation with urinalysis is recommended. Electronically Signed   By: Constance Holster M.D.   On: 09/17/2019 01:03   DG Pelvis Portable  Result Date: 09/16/2019 CLINICAL DATA:  Status post fall. EXAM: PORTABLE PELVIS 1-2 VIEWS COMPARISON:  None. FINDINGS: There is no evidence of an acute pelvic fracture or diastasis. Mild chronic changes are seen along the symphysis pubis. A total right hip replacement is noted. There is no evidence of surrounding lucency to suggest the presence of hardware loosening or infection. Soft tissue structures are unremarkable. IMPRESSION: Intact right hip replacement. Electronically Signed   By: Virgina Norfolk M.D.   On: 09/16/2019 19:12   DG Chest Port 1 View  Result Date: 09/18/2019 CLINICAL DATA:  Pleural effusion follow-up. EXAM: PORTABLE CHEST 1 VIEW COMPARISON:  September 16, 2019 FINDINGS: Cardiomegaly is stable. The hila and mediastinum are normal. Small bilateral effusions with  underlying atelectasis. Increased interstitial markings consistent with mild edema, increased in the interval. No other acute abnormalities. IMPRESSION: Cardiomegaly, small stable pleural effusions, mild increased pulmonary edema. Electronically Signed   By: Dorise Bullion III M.D   On: 09/18/2019 12:23   DG Chest Port 1 View  Result Date: 09/16/2019 CLINICAL DATA:  Status post fall with loss of consciousness. EXAM: PORTABLE CHEST 1 VIEW COMPARISON:  June 15, 2019 FINDINGS: Mild, stable diffusely increased lung markings are  seen. Mild atelectasis and/or infiltrate is seen within the retrocardiac region of the left lung base. Small bilateral pleural effusions are noted. There is no evidence of a pneumothorax. The cardiac silhouette is mildly enlarged and unchanged in size. There is moderate severity calcification of the aortic arch. The visualized skeletal structures are unremarkable. IMPRESSION: 1. Mild left basilar atelectasis and/or infiltrate. 2. Small bilateral pleural effusions. Electronically Signed   By: Virgina Norfolk M.D.   On: 09/16/2019 19:07   DG Shoulder Left  Result Date: 09/16/2019 CLINICAL DATA:  Status post fall. EXAM: LEFT SHOULDER - 2+ VIEW COMPARISON:  None. FINDINGS: There is no evidence of acute fracture or dislocation. Moderate severity degenerative changes seen involving the distal left clavicle, left acromioclavicular joint and left acromion. Soft tissues are unremarkable. IMPRESSION: Degenerative changes, without evidence of acute osseous abnormality. Electronically Signed   By: Virgina Norfolk M.D.   On: 09/16/2019 19:14   DG Knee Left Port  Result Date: 09/16/2019 CLINICAL DATA:  Status post fall. EXAM: PORTABLE LEFT KNEE - 1-2 VIEW COMPARISON:  None. FINDINGS: It should be noted that the medial and lateral epicondyles of the distal left femur are limited in evaluation on the frontal view secondary to the presence of an overlying radiopaque bandage. No evidence of acute  fracture, dislocation, or joint effusion. No evidence of arthropathy or other focal bone abnormality. There is mild infrapatellar soft tissue swelling. A mild to moderate amount of soft tissue air is also seen within this region. IMPRESSION: 1. Mild to moderate amount of infrapatellar soft tissue swelling and soft tissue air without evidence of acute osseous abnormality. Electronically Signed   By: Virgina Norfolk M.D.   On: 09/16/2019 19:10   DG Tibia/Fibula Left Port  Result Date: 09/16/2019 CLINICAL DATA:  Fall EXAM: PORTABLE LEFT TIBIA AND FIBULA - 2 VIEW COMPARISON:  None. FINDINGS: Infrapatellar soft tissue swelling with soft tissue air again noted as seen on earlier knee series. No acute fracture, subluxation or dislocation. Old healed distal fibular shaft fracture. IMPRESSION: No acute bony abnormality. Electronically Signed   By: Rolm Baptise M.D.   On: 09/16/2019 19:32        Scheduled Meds: . aspirin  81 mg Oral Daily  . budesonide (PULMICORT) nebulizer solution  0.25 mg Nebulization BID  . clopidogrel  75 mg Oral Daily  . dextromethorphan-guaiFENesin  1 tablet Oral BID  . DULoxetine  60 mg Oral Daily  . feeding supplement (GLUCERNA SHAKE)  237 mL Oral TID BM  . insulin aspart  0-5 Units Subcutaneous QHS  . insulin aspart  0-9 Units Subcutaneous TID WC  . ipratropium-albuterol  3 mL Nebulization Q6H  . lidocaine-EPINEPHrine  20 mL Intradermal Once  . methylPREDNISolone (SOLU-MEDROL) injection  60 mg Intravenous Q6H  . multivitamin with minerals  1 tablet Oral Daily  . tamsulosin  0.4 mg Oral QPC breakfast   Continuous Infusions: .  ceFAZolin (ANCEF) IV 2 g (09/18/19 0547)     LOS: 2 days       Hosie Poisson, MD [facility]   To contact the attending provider between 7A-7P or the covering provider during after hours 7P-7A, please log into the web site [email or web address] and access using universal [place] password for that web site. If you do not have the  password, please call the hospital operator.  09/18/2019, 5:10 PM

## 2019-09-18 NOTE — Progress Notes (Signed)
    Subjective: Patient reports pain as moderate, improving.  Objective:   VITALS:   Vitals:   09/18/19 1221 09/18/19 1410 09/18/19 1541 09/18/19 1614  BP: 119/80  118/73 121/69  Pulse: 72  74 75  Resp: 18  17 15   Temp: 98.7 F (37.1 C)  98.3 F (36.8 C) 98.3 F (36.8 C)  TempSrc: Oral  Oral Oral  SpO2: 98% 98% 98% 94%  Weight:      Height:       CBC Latest Ref Rng & Units 09/18/2019 09/18/2019 09/18/2019  WBC 4.0 - 10.5 K/uL - 9.2 -  Hemoglobin 13.0 - 17.0 g/dL 7.8(L) 7.9(L) 8.3(L)  Hematocrit 39.0 - 52.0 % 25.1(L) 24.8(L) 25.5(L)  Platelets 150 - 400 K/uL - 102(L) -   BMP Latest Ref Rng & Units 09/18/2019 09/17/2019 09/16/2019  Glucose 70 - 99 mg/dL 174(H) 137(H) 122(H)  BUN 8 - 23 mg/dL 55(H) 48(H) 42(H)  Creatinine 0.61 - 1.24 mg/dL 2.77(H) 2.89(H) 3.00(H)  Sodium 135 - 145 mmol/L 140 139 136  Potassium 3.5 - 5.1 mmol/L 4.7 4.6 4.4  Chloride 98 - 111 mmol/L 103 102 104  CO2 22 - 32 mmol/L 28 25 -  Calcium 8.9 - 10.3 mg/dL 8.4(L) 8.8(L) -   Intake/Output      04/09 0701 - 04/10 0700 04/10 0701 - 04/11 0700   P.O. 360 240   I.V. (mL/kg)     Blood 998.3 315   IV Piggyback 236.8    Total Intake(mL/kg) 1595.2 (16.7) 555 (5.8)   Urine (mL/kg/hr) 850 (0.4) 600 (0.6)   Total Output 850 600   Net +745.2 -45           Physical Exam: General: NAD.  Supine in bed.  Conversant.  Nurse at bedside.  MSK LLE: Compression dressing and ABDs in place.  No active bleeding but layer of dressings nearest skin is saturated.  This was removed completely.  Laceration intact with nylon suture.  No active bleeding.  Lateral calf distal to knee hematoma 8 x 6 cm, without sign of infection.   He has fairly symmetric LE edema.  Feet are warm.  EHL, FHL, dorsiflexion, plantarflexion intact. New dressings were applied.     Assessment:  Principal Problem:   Symptomatic bradycardia Active Problems:   A-fib (HCC)   QT prolongation   COPD with acute exacerbation (HCC)   AKI (acute  kidney injury) (HCC)   Knee laceration, left, initial encounter   Left knee laceration  Patient has an intact extensor mechanism and CT shows no fracture and intact tendons.  Bleeding stopped this morning.  No indication for surgery at this time.  All dressings completely removed today.  Laceration is intact with nylon suture and there is no active bleeding.    Plan:  New compressive/absorbent dressings applied: Xeroform, ABD, Kerlix, Ace wrap.  Change if soiled.  Weight Bearing Status/Activity: WBAT LLE.  Keep knee extended with leg straight 2 weeks or until he can follow-up again with orthopedics in 1-2 weeks.  Okay to use knee immobilizer for comfort/compliance knee extension.  Continue IV antibiotics. May need to transition to oral antibiotics upon discharge. Continue compressive/absorbant dressings  Elevate extremity.  Encourage ankle flexion/extension to facilitate movement of fluids as he has chronic LE Edema and injury.    Justin Burly III, PA-C 09/18/2019, 6:07 PM

## 2019-09-19 DIAGNOSIS — R062 Wheezing: Secondary | ICD-10-CM | POA: Diagnosis not present

## 2019-09-19 DIAGNOSIS — I480 Paroxysmal atrial fibrillation: Secondary | ICD-10-CM | POA: Diagnosis not present

## 2019-09-19 DIAGNOSIS — R001 Bradycardia, unspecified: Secondary | ICD-10-CM | POA: Diagnosis not present

## 2019-09-19 LAB — TYPE AND SCREEN
ABO/RH(D): O POS
Antibody Screen: NEGATIVE
Unit division: 0
Unit division: 0
Unit division: 0
Unit division: 0

## 2019-09-19 LAB — HEMOGLOBIN AND HEMATOCRIT, BLOOD
HCT: 28.2 % — ABNORMAL LOW (ref 39.0–52.0)
HCT: 28.2 % — ABNORMAL LOW (ref 39.0–52.0)
HCT: 28.5 % — ABNORMAL LOW (ref 39.0–52.0)
Hemoglobin: 9 g/dL — ABNORMAL LOW (ref 13.0–17.0)
Hemoglobin: 9.1 g/dL — ABNORMAL LOW (ref 13.0–17.0)
Hemoglobin: 9 g/dL — ABNORMAL LOW (ref 13.0–17.0)

## 2019-09-19 LAB — BPAM RBC
Blood Product Expiration Date: 202105112359
Blood Product Expiration Date: 202105122359
Blood Product Expiration Date: 202105142359
Blood Product Expiration Date: 202105142359
ISSUE DATE / TIME: 202104091031
ISSUE DATE / TIME: 202104092036
ISSUE DATE / TIME: 202104101554
ISSUE DATE / TIME: 202104102039
Unit Type and Rh: 5100
Unit Type and Rh: 5100
Unit Type and Rh: 5100
Unit Type and Rh: 5100

## 2019-09-19 LAB — BASIC METABOLIC PANEL
Anion gap: 11 (ref 5–15)
BUN: 50 mg/dL — ABNORMAL HIGH (ref 8–23)
CO2: 28 mmol/L (ref 22–32)
Calcium: 8.6 mg/dL — ABNORMAL LOW (ref 8.9–10.3)
Chloride: 102 mmol/L (ref 98–111)
Creatinine, Ser: 2.37 mg/dL — ABNORMAL HIGH (ref 0.61–1.24)
GFR calc Af Amer: 32 mL/min — ABNORMAL LOW (ref >60)
GFR calc non Af Amer: 27 mL/min — ABNORMAL LOW (ref >60)
Glucose, Bld: 148 mg/dL — ABNORMAL HIGH (ref 70–99)
Potassium: 4.2 mmol/L (ref 3.5–5.1)
Sodium: 141 mmol/L (ref 135–145)

## 2019-09-19 LAB — CBC WITH DIFFERENTIAL/PLATELET
Abs Immature Granulocytes: 0.08 10*3/uL — ABNORMAL HIGH (ref 0.00–0.07)
Basophils Absolute: 0 10*3/uL (ref 0.0–0.1)
Basophils Relative: 0 %
Eosinophils Absolute: 0 10*3/uL (ref 0.0–0.5)
Eosinophils Relative: 0 %
HCT: 28.8 % — ABNORMAL LOW (ref 39.0–52.0)
Hemoglobin: 9.1 g/dL — ABNORMAL LOW (ref 13.0–17.0)
Immature Granulocytes: 1 %
Lymphocytes Relative: 5 %
Lymphs Abs: 0.5 10*3/uL — ABNORMAL LOW (ref 0.7–4.0)
MCH: 28.5 pg (ref 26.0–34.0)
MCHC: 31.6 g/dL (ref 30.0–36.0)
MCV: 90.3 fL (ref 80.0–100.0)
Monocytes Absolute: 0.7 10*3/uL (ref 0.1–1.0)
Monocytes Relative: 8 %
Neutro Abs: 8.1 10*3/uL — ABNORMAL HIGH (ref 1.7–7.7)
Neutrophils Relative %: 86 %
Platelets: 97 10*3/uL — ABNORMAL LOW (ref 150–400)
RBC: 3.19 MIL/uL — ABNORMAL LOW (ref 4.22–5.81)
RDW: 20.6 % — ABNORMAL HIGH (ref 11.5–15.5)
WBC: 9.4 10*3/uL (ref 4.0–10.5)
nRBC: 0.6 % — ABNORMAL HIGH (ref 0.0–0.2)

## 2019-09-19 LAB — GLUCOSE, CAPILLARY
Glucose-Capillary: 155 mg/dL — ABNORMAL HIGH (ref 70–99)
Glucose-Capillary: 149 mg/dL — ABNORMAL HIGH (ref 70–99)
Glucose-Capillary: 199 mg/dL — ABNORMAL HIGH (ref 70–99)
Glucose-Capillary: 174 mg/dL — ABNORMAL HIGH (ref 70–99)

## 2019-09-19 MED ORDER — ALBUTEROL SULFATE (2.5 MG/3ML) 0.083% IN NEBU: 2.5000 mg | INHALATION_SOLUTION | Freq: Four times a day (QID) | RESPIRATORY_TRACT | Status: AC | PRN

## 2019-09-19 NOTE — Progress Notes (Signed)
PROGRESS NOTE    Justin Boone  ACZ:[id number] DOB: 02-Mar-1951 DOA: 09/16/2019 PCP: Patient, No Pcp Per   Brief Narrative:  69 year old gentleman prior history of atrial fibrillation on Eliquis, diabetes mellitus, hypertension, COPD, obstructive sleep apnea on CPAP, ongoing tobacco use stage IIIb CKD, atrial fibrillation/flutter s/p cardioversion on amiodarone and Eliquis, chronic systolic heart failure, recent left heart catheterization in the setting of TAVR work-up on 08/12/2019, s/p drug-eluting stent to the LAD on Plavix and Eliquis presents to ED after a fall and left knee laceration.  X-rays of the left knee shows mild to moderate amount of infrapatellar soft tissue swelling and soft tissue air without evidence of acute abnormality.  Due to persistent bleeding orthopedics consulted and suggested to continue with compression wrapping and no indication of urgent washout of the knee in the OR.  Patient started bleeding profusely and persistently from the laceration soaking his dressing, bandages in the bed.  Patient's hemoglobin has dropped from 9.9-7.8 the last 24 hours from profuse bleeding from the left nasal laceration.  Discussed with pharmacy regarding Andexxa suggested that this is not a life-threatening bleeding and his last Eliquis dose was on the morning of 09/16/2019, he is out of the window and has been 24 hours since her last Eliquis dose.  He was given  K Centra and vitamin K respiratory anticoagulation antidote protocol. Patient's admission was changed to progressive unit and PCCM also consulted to see if he needs to be transferred to ICU for closer monitoring and persistent bleeding.  Patient seen and examined at bedside this morning no further bleeding from the left knee laceration. Orthopedics recommended follow-up for left knee laceration evaluation and dressing change in 1 to 2 weeks possibly to be done at [facility] as he is going to have the TAVR procedure one  Wednesday.   Assessment & Plan:   Principal Problem:   Symptomatic bradycardia Active Problems:   A-fib (HCC)   QT prolongation   COPD with acute exacerbation (HCC)   AKI (acute kidney injury) (HCC)   Knee laceration, left, initial encounter   Left knee laceration with profuse and persistent bleeding on admission  Bleeding has been controlled with compression bandages which have been changed today by orthopedics.  1 dose of Kcentra given.  Orthopedics recommends weightbearing as tolerated on the left lower extremity and to keep the left knee extended and straight for 2 weeks and follow-up with orthopedics in 1 to 2 weeks for laceration evaluation and dressing changes.  Okay to use knee immobilizer for comfort/compliance of knee extension.  We will transition IV antibiotics to oral Keflex on discharge to complete the course elevate extremity and encourage ankle flexion and extension to facilitate movement. Transfuse to keep hemoglobin greater than 7     Persistent dizziness, symptomatic bradycardia No episodes of bradycardia since holding Cardizem and metoprolol Cardiology consulted, and will follow up regarding PPM with TAVR, recommended to continue aspirin 81 mg and Plavix 75 mg and hold Eliquis in preparation for TAVR next week . Appreciate cardiology recommendations   Paroxysmal atrial flutter/fibrillation S/p cardioversion, rate controlled Anticoagulation on hold due to active bleeding.    Syncope Probably secondary to bradycardia, aortic stenosis.   Type 2 diabetes mellitus  CBG (last 3)  Recent Labs    09/19/19 0625 09/19/19 1215 09/19/19 1652  GLUCAP 155* 149* 199*    Hemoglobin A1c is 6 Continue with sliding scale insulin.  No changes in medication   Chronic systolic heart failure Repeat echocardiogram has  been ordered.    Acute anemia of blood loss from left knee laceration Unknown baseline hemoglobin. Transfuse 2 units of PRBC today and Pete CBC  shows hemoglobin greater than 9   Obstructive sleep apnea on CPAP at night continue the same.    Essential hypertension Blood pressure parameters are well controlled   COPD Tobacco abuse Counseled against tobacco use Patient continues to have expiratory wheezing continue with IV steroids 60 mg twice daily for another 24 hours     AKI on stage III CKD Ultrasound of the kidney did not show any hydronephrosis or obstruction. Admitted 2.3 this morning and improving   DVT prophylaxis: SCDs Code Status: FULL CODE.  Family Communication: none at bedside.  Disposition Plan:  . Patient came from: Home.             . Anticipated d/c place: pending.  . Barriers to d/c OR conditions which need to be met to effect a safe d/c: Possible transfer to [facility] as requested by the patient   Consultants:   Orthopedics  Cardiology  PCCM.     Procedures:  Antimicrobials: cefazolin IV since admission.   Subjective: Persistent pain in the left knee improving, wheezing has improved but not resolved.  Patient denies any chest pain or shortness of breath at this time  Objective: Vitals:   09/19/19 0600 09/19/19 0724 09/19/19 0821 09/19/19 1445  BP: 129/69  129/68   Pulse: 69   76  Resp: 12   18  Temp:   98.8 F (37.1 C)   TempSrc:   Oral   SpO2: 93% 94%  97%  Weight:      Height:        Intake/Output Summary (Last 24 hours) at 09/19/2019 1720 Last data filed at 09/19/2019 1706 Gross per 24 hour  Intake 1360.98 ml  Output 2550 ml  Net -1189.02 ml   Filed Weights   09/16/19 1820 09/18/19 0426 09/19/19 0558  Weight: 86.2 kg 95.7 kg 96.2 kg    Examination:  General exam: Alert and comfortable, not in any kind of distress Respiratory system: Scattered wheezing heard posteriorly Cardiovascular system: S1-S2 heard, irregularly irregular, no JVD,  gastrointestinal system: Abdomen is soft, nontender, nondistended bowel sounds normal Central nervous system: Alert and oriented,  grossly nonfocal Extremities: Left lower extremity swollen, bruised on the lateral aspect and wrapped with compression bandages Skin: Bruising over the lateral aspect of the left leg Psychiatry: Mood is appropriate    Data Reviewed: I have personally reviewed following labs and imaging studies  CBC: Recent Labs  Lab 09/16/19 1817 09/16/19 1820 09/17/19 0821 09/17/19 1554 09/18/19 0049 09/18/19 0550 09/18/19 1230 09/19/19 0313 09/19/19 1223  WBC 8.3  --  8.6  --   --  9.2  --   --  9.4  NEUTROABS  --   --   --   --   --   --   --   --  8.1*  HGB 9.1*   < > 7.8*   < > 8.3* 7.9* 7.8* 9.0* 9.1*  HCT 29.4*   < > 25.4*   < > 25.5* 24.8* 25.1* 28.2* 28.8*  MCV 95.5  --  97.3  --   --  89.5  --   --  90.3  PLT 120*  --  120*  --   --  102*  --   --  97*   < > = values in this interval not displayed.   Basic Metabolic Panel: Recent  Labs  Lab 09/16/19 1817 09/16/19 1820 09/16/19 2352 09/17/19 0821 09/18/19 0550 09/19/19 1223  NA 137 136  --  139 140 141  K 4.7 4.4  --  4.6 4.7 4.2  CL 103 104  --  102 103 102  CO2 24  --   --  25 28 28   GLUCOSE 134* 122*  --  137* 174* 148*  BUN 45* 42*  --  48* 55* 50*  CREATININE 2.87* 3.00*  --  2.89* 2.77* 2.37*  CALCIUM 8.9  --   --  8.8* 8.4* 8.6*  MG  --   --  2.2  --   --   --    GFR: Estimated Creatinine Clearance: 36.4 mL/min (A) (by C-G formula based on SCr of 2.37 mg/dL (H)). Liver Function Tests: Recent Labs  Lab 09/16/19 1817  AST 18  ALT 15  ALKPHOS 110  BILITOT 1.1  PROT 5.7*  ALBUMIN 2.8*   No results for input(s): LIPASE, AMYLASE in the last 168 hours. No results for input(s): AMMONIA in the last 168 hours. Coagulation Profile: Recent Labs  Lab 09/16/19 1817 09/17/19 1200  INR 2.4* 1.7*   Cardiac Enzymes: No results for input(s): CKTOTAL, CKMB, CKMBINDEX, TROPONINI in the last 168 hours. BNP (last 3 results) No results for input(s): PROBNP in the last 8760 hours. HbA1C: Recent Labs    09/16/19 2352   HGBA1C 6.0*   CBG: Recent Labs  Lab 09/18/19 1616 09/18/19 2102 09/19/19 0625 09/19/19 1215 09/19/19 1652  GLUCAP 141* 148* 155* 149* 199*   Lipid Profile: No results for input(s): CHOL, HDL, LDLCALC, TRIG, CHOLHDL, LDLDIRECT in the last 72 hours. Thyroid Function Tests: Recent Labs    09/16/19 2352  TSH 3.253   Anemia Panel: Recent Labs    09/16/19 2352  VITAMINB12 389  FOLATE 9.7  FERRITIN 96  TIBC 307  IRON 33*  RETICCTPCT 1.6   Sepsis Labs: Recent Labs  Lab 09/16/19 1826 09/16/19 2352  PROCALCITON  --  <0.10  LATICACIDVEN 1.4  --     Recent Results (from the past 240 hour(s))  Respiratory Panel by RT PCR (Flu A&B, Covid) - Nasopharyngeal Swab     Status: None   Collection Time: 09/16/19  8:24 PM   Specimen: Nasopharyngeal Swab  Result Value Ref Range Status   SARS Coronavirus 2 by RT PCR NEGATIVE NEGATIVE Final    Comment: (NOTE) SARS-CoV-2 target nucleic acids are NOT DETECTED. The SARS-CoV-2 RNA is generally detectable in upper respiratoy specimens during the acute phase of infection. The lowest concentration of SARS-CoV-2 viral copies this assay can detect is 131 copies/mL. A negative result does not preclude SARS-Cov-2 infection and should not be used as the sole basis for treatment or other patient management decisions. A negative result may occur with  improper specimen collection/handling, submission of specimen other than nasopharyngeal swab, presence of viral mutation(s) within the areas targeted by this assay, and inadequate number of viral copies (<131 copies/mL). A negative result must be combined with clinical observations, patient history, and epidemiological information. The expected result is Negative. Fact Sheet for Patients:  PinkCheek.be Fact Sheet for Healthcare Providers:  GravelBags.it This test is not yet ap proved or cleared by the [place] FDA and  has been  authorized for detection and/or diagnosis of SARS-CoV-2 by FDA under an Emergency Use Authorization (EUA). This EUA will remain  in effect (meaning this test can be used) for the duration of the COVID-19 declaration under Section  564(b)(1) of the Act, 21 U.S.C. section 360bbb-3(b)(1), unless the authorization is terminated or revoked sooner.    Influenza A by PCR NEGATIVE NEGATIVE Final   Influenza B by PCR NEGATIVE NEGATIVE Final    Comment: (NOTE) The Xpert Xpress SARS-CoV-2/FLU/RSV assay is intended as an aid in  the diagnosis of influenza from Nasopharyngeal swab specimens and  should not be used as a sole basis for treatment. Nasal washings and  aspirates are unacceptable for Xpert Xpress SARS-CoV-2/FLU/RSV  testing. Fact Sheet for Patients: PinkCheek.be Fact Sheet for Healthcare Providers: GravelBags.it This test is not yet approved or cleared by the [place] FDA and  has been authorized for detection and/or diagnosis of SARS-CoV-2 by  FDA under an Emergency Use Authorization (EUA). This EUA will remain  in effect (meaning this test can be used) for the duration of the  Covid-19 declaration under Section 564(b)(1) of the Act, 21  U.S.C. section 360bbb-3(b)(1), unless the authorization is  terminated or revoked. Performed at [facility], [place] [address] [place], [place] [address]          Radiology Studies: DG Chest Port 1 View  Result Date: 09/18/2019 CLINICAL DATA:  Pleural effusion follow-up. EXAM: PORTABLE CHEST 1 VIEW COMPARISON:  September 16, 2019 FINDINGS: Cardiomegaly is stable. The hila and mediastinum are normal. Small bilateral effusions with underlying atelectasis. Increased interstitial markings consistent with mild edema, increased in the interval. No other acute abnormalities. IMPRESSION: Cardiomegaly, small stable pleural effusions, mild increased pulmonary edema. Electronically Signed   By:  Dorise Bullion III M.D   On: 09/18/2019 12:23        Scheduled Meds: . aspirin  81 mg Oral Daily  . budesonide (PULMICORT) nebulizer solution  0.25 mg Nebulization BID  . clopidogrel  75 mg Oral Daily  . dextromethorphan-guaiFENesin  1 tablet Oral BID  . DULoxetine  60 mg Oral Daily  . feeding supplement (GLUCERNA SHAKE)  237 mL Oral TID BM  . insulin aspart  0-5 Units Subcutaneous QHS  . insulin aspart  0-9 Units Subcutaneous TID WC  . ipratropium-albuterol  3 mL Nebulization Q6H  . lidocaine-EPINEPHrine  20 mL Intradermal Once  . methylPREDNISolone (SOLU-MEDROL) injection  60 mg Intravenous Q12H  . multivitamin with minerals  1 tablet Oral Daily  . tamsulosin  0.4 mg Oral QPC breakfast   Continuous Infusions: .  ceFAZolin (ANCEF) IV 2 g (09/19/19 0653)     LOS: 3 days       Hosie Poisson, MD [facility]   To contact the attending provider between 7A-7P or the covering provider during after hours 7P-7A, please log into the web site [email or web address] and access using universal [place] password for that web site. If you do not have the password, please call the hospital operator.  09/19/2019, 5:20 PM

## 2019-09-19 NOTE — Progress Notes (Addendum)
Received patient asleep.  He woke up and informed me he received pain medicine .  I offered to place him on his CPAP machine but he refused at this time.  He requested to go on bipap around  2300

## 2019-09-19 NOTE — Progress Notes (Addendum)
Home CPAP in use.  VS stable.

## 2019-09-19 NOTE — Progress Notes (Signed)
    Subjective: Patient reports pain is quite a bit better since dressing changes yesterday.  No interval bleeding.  Objective:   VITALS:   Vitals:   09/19/19 0558 09/19/19 0600 09/19/19 0724 09/19/19 0821  BP:  129/69  129/68  Pulse: 69 69    Resp: 14 12    Temp:    98.8 F (37.1 C)  TempSrc:    Oral  SpO2: 92% 93% 94%   Weight: 96.2 kg     Height:       CBC Latest Ref Rng & Units 09/19/2019 09/18/2019 09/18/2019  WBC 4.0 - 10.5 K/uL - - 9.2  Hemoglobin 13.0 - 17.0 g/dL 9.0(L) 7.8(L) 7.9(L)  Hematocrit 39.0 - 52.0 % 28.2(L) 25.1(L) 24.8(L)  Platelets 150 - 400 K/uL - - 102(L)   BMP Latest Ref Rng & Units 09/18/2019 09/17/2019 09/16/2019  Glucose 70 - 99 mg/dL 174(H) 137(H) 122(H)  BUN 8 - 23 mg/dL 55(H) 48(H) 42(H)  Creatinine 0.61 - 1.24 mg/dL 2.77(H) 2.89(H) 3.00(H)  Sodium 135 - 145 mmol/L 140 139 136  Potassium 3.5 - 5.1 mmol/L 4.7 4.6 4.4  Chloride 98 - 111 mmol/L 103 102 104  CO2 22 - 32 mmol/L 28 25 -  Calcium 8.9 - 10.3 mg/dL 8.4(L) 8.8(L) -   Intake/Output      04/10 0701 - 04/11 0700 04/11 0701 - 04/12 0700   P.O. 240 487   I.V. (mL/kg) 129 (1.3)    Blood 945    IV Piggyback 115    Total Intake(mL/kg) 1429 (14.9) 487 (5.1)   Urine (mL/kg/hr) 1650 (0.7) 550 (1.3)   Total Output 1650 550   Net -221 -63           Physical Exam: General: NAD.  Supine in bed.  Conversant.    MSK LLE: Padded absorbent compressive dressings clean dry and intact. He has fairly symmetric LE edema.  Feet are warm.  EHL, FHL, dorsiflexion, plantarflexion intact.  Assessment:  Principal Problem:   Symptomatic bradycardia Active Problems:   A-fib (HCC)   QT prolongation   COPD with acute exacerbation (HCC)   AKI (acute kidney injury) (HCC)   Knee laceration, left, initial encounter   Left knee laceration  Patient has an intact extensor mechanism and CT shows no fracture and intact tendons.  No indication for surgery.  All dressings completely removed 09/18/2019.   Laceration was intact with nylon suture and there was no active bleeding.    Plan:  New compressive/absorbent dressings applied: Xeroform, ABD, Kerlix, Ace wrap.  Change if soiled.  Weight Bearing Status/Activity: WBAT LLE.  Keep knee extended with leg straight 2 weeks or until he can follow-up again with orthopedics in 1-2 weeks.  Okay to use knee immobilizer for comfort/compliance knee extension.  Continue IV antibiotics. May need to transition to oral antibiotics upon discharge. Continue compressive/absorbant dressings  Elevate extremity.  Encourage ankle flexion/extension to facilitate movement of fluids as he has chronic LE Edema and injury.  Dispo:  Left knee laceration stable.  Please call with questions.   He will need follow-up for laceration evaluation/dressing change in 1 to 2 weeks.  This may need to be done at [facility] if he has TAVR procedure.  Charna Elizabeth Martensen III, PA-C 09/19/2019, 11:28 AM

## 2019-09-19 NOTE — Progress Notes (Signed)
Patient requested not to be woken up for 0[facility] and assured me he'd call if he needed when he woke up.

## 2019-09-19 NOTE — Progress Notes (Signed)
Patient was placed on his home PPV device.

## 2019-09-19 NOTE — Consult Note (Signed)
WOC Nurse Consult Note: Reason for Consult: Left knee laceration approximated with sutures on Thursday. Wound type: Traumatic Pressure Injury POA: N/A Measurement: 0.4cm x 15.5cm  Hematoma to left lateral LE measures 7cm x 6cm Wound bed: N/A Drainage (amount, consistency, odor) N/A Periwound: ecchymosis surrounds laceration, edema is markedly improved according to bedside RN Dianna who performed wound care yesterday and patient. Dressing procedure/placement/frequency: I have provided guidance for Nursing via the Orders for once daily wound care. Continue the mild compression.  Float heels to prevent pressure injury.  I have also added a silicone foam dressing to the sacrum to prevent pressure injury as patient is limited in mobility.  [place] nursing team will not follow, but will remain available to this patient, the nursing and medical teams.  Please re-consult if needed. Thanks, Maudie Flakes, MSN, RN, [place], [facility]  Pager# [phone]

## 2019-09-19 NOTE — Progress Notes (Signed)
Progress Note  Patient Name: Justin Boone Date of Encounter: 09/19/2019  Primary Cardiologist: Posey Boyer, MD    Subjective   69 year old gentleman with a history of aortic stenosis.  He is scheduled to have TAVR at [facility].  He has severe aortic stenosis with low gradient due to low cardiac output.  He has had some problems with bradycardia while on Toprol and amiodarone.  The Toprol and amiodarone have been discontinued.  He has a history of coronary artery disease and is status post recent stent placement.  He has been restarted on Plavix and aspirin. He has a history of paroxysmal atrial fibrillation.  His Eliquis is on hold ( for his severe knee laceration ) .  He is scheduled to have TAVR in [place] on Wednesday.  We were consulted for bradycardia. He has not had any bradycardia since stopping the amio and metoprolol  He is wheezing,  Wrote for some albuterol   Inpatient Medications    Scheduled Meds: . aspirin  81 mg Oral Daily  . budesonide (PULMICORT) nebulizer solution  0.25 mg Nebulization BID  . clopidogrel  75 mg Oral Daily  . dextromethorphan-guaiFENesin  1 tablet Oral BID  . DULoxetine  60 mg Oral Daily  . feeding supplement (GLUCERNA SHAKE)  237 mL Oral TID BM  . insulin aspart  0-5 Units Subcutaneous QHS  . insulin aspart  0-9 Units Subcutaneous TID WC  . ipratropium-albuterol  3 mL Nebulization Q6H  . lidocaine-EPINEPHrine  20 mL Intradermal Once  . methylPREDNISolone (SOLU-MEDROL) injection  60 mg Intravenous Q12H  . multivitamin with minerals  1 tablet Oral Daily  . tamsulosin  0.4 mg Oral QPC breakfast   Continuous Infusions: .  ceFAZolin (ANCEF) IV 2 g (09/19/19 0653)   PRN Meds: acetaminophen **OR** acetaminophen, albuterol, HYDROcodone-acetaminophen, morphine injection   Vital Signs    Vitals:   09/19/19 0558 09/19/19 0600 09/19/19 0724 09/19/19 0821  BP:  129/69  129/68  Pulse: 69 69    Resp: 14 12    Temp:    98.8 F (37.1 C)   TempSrc:    Oral  SpO2: 92% 93% 94%   Weight: 96.2 kg     Height:        Intake/Output Summary (Last 24 hours) at 09/19/2019 1346 Last data filed at 09/19/2019 0917 Gross per 24 hour  Intake 1675.98 ml  Output 2200 ml  Net -524.02 ml   Last 3 Weights 09/19/2019 09/18/2019 09/16/2019  Weight (lbs) 212 lb 211 lb 190 lb  Weight (kg) 96.163 kg 95.709 kg 86.183 kg      Telemetry     NSR - Personally Reviewed  ECG     - Personally Reviewed  Physical Exam   Physical Exam: Blood pressure 129/68, pulse 69, temperature 98.8 F (37.1 C), temperature source Oral, resp. rate 12, height 6' (1.829 m), weight 96.2 kg, SpO2 94 %.  GEN:  69 year old male,  Appears older than stated age.  HEENT: Normal NECK: No JVD; No carotid bruits LYMPHATICS: No lymphadenopathy CARDIAC: RRR  , soft murmur , difficult to hear over wheezing  RESPIRATORY:   Bilateral wheezing and rhonchi  ABDOMEN: Soft, non-tender, non-distended MUSCULOSKELETAL:  No edema; No deformity  SKIN: Warm and dry NEUROLOGIC:  Alert and oriented x 3   Labs    High Sensitivity Troponin:  No results for input(s): TROPONINIHS in the last 720 hours.    Chemistry Recent Labs  Lab 09/16/19 1817 09/16/19 1820 09/17/19 2409  09/18/19 0550 09/19/19 1223  NA 137   < > 139 140 141  K 4.7   < > 4.6 4.7 4.2  CL 103   < > 102 103 102  CO2 24   < > 25 28 28   GLUCOSE 134*   < > 137* 174* 148*  BUN 45*   < > 48* 55* 50*  CREATININE 2.87*   < > 2.89* 2.77* 2.37*  CALCIUM 8.9   < > 8.8* 8.4* 8.6*  PROT 5.7*  --   --   --   --   ALBUMIN 2.8*  --   --   --   --   AST 18  --   --   --   --   ALT 15  --   --   --   --   ALKPHOS 110  --   --   --   --   BILITOT 1.1  --   --   --   --   GFRNONAA 22*   < > 21* 23* 27*  GFRAA 25*   < > 25* 26* 32*  ANIONGAP 10   < > 12 9 11    < > = values in this interval not displayed.     Hematology Recent Labs  Lab 09/17/19 0821 09/17/19 1554 09/18/19 0550 09/18/19 0550 09/18/19 1230  09/19/19 0313 09/19/19 1223  WBC 8.6  --  9.2  --   --   --  9.4  RBC 2.61*  --  2.77*  --   --   --  3.19*  HGB 7.8*   < > 7.9*   < > 7.8* 9.0* 9.1*  HCT 25.4*   < > 24.8*   < > 25.1* 28.2* 28.8*  MCV 97.3  --  89.5  --   --   --  90.3  MCH 29.9  --  28.5  --   --   --  28.5  MCHC 30.7  --  31.9  --   --   --  31.6  RDW 22.8*  --  22.2*  --   --   --  20.6*  PLT 120*  --  102*  --   --   --  97*   < > = values in this interval not displayed.    BNPNo results for input(s): BNP, PROBNP in the last 168 hours.   DDimer No results for input(s): DDIMER in the last 168 hours.   Radiology    DG Chest Port 1 View  Result Date: 09/18/2019 CLINICAL DATA:  Pleural effusion follow-up. EXAM: PORTABLE CHEST 1 VIEW COMPARISON:  September 16, 2019 FINDINGS: Cardiomegaly is stable. The hila and mediastinum are normal. Small bilateral effusions with underlying atelectasis. Increased interstitial markings consistent with mild edema, increased in the interval. No other acute abnormalities. IMPRESSION: Cardiomegaly, small stable pleural effusions, mild increased pulmonary edema. Electronically Signed   By: Dorise Bullion III M.D   On: 09/18/2019 12:23    Cardiac Studies     Patient Profile     69 y.o. male   Assessment & Plan    1.  Aortic stenosis: The patient is scheduled to have TAVR at [facility] on Wednesday. We have restarted his aspirin and Plavix for his coronary artery disease and stenting last week. Eliquis is still on hold from his knee laceration and we will continue to hold given his scheduled surgery in several days.  Will likely need to be transferred to  [facility] for proposed TAVR on WEDnesday   2.  Coronary artery disease: He is not had any episodes of chest pain.    hes back on ASA an dplavix    3.  Paroxysmal atrial fibrillation:  Still maintaining NSR .   Off eliquis for planned TAVR on Wednesday   4.  Symptomatic bradycardia:   Better after DC of metoprolol and amiodarone      For questions or updates, please contact [place] Please consult [email or web address] for contact info under        Signed, Mertie Moores, MD  09/19/2019, 1:46 PM

## 2019-09-19 NOTE — Progress Notes (Signed)
Patient set up to go on his home CPAP overnight.

## 2019-09-19 NOTE — Plan of Care (Signed)
  Problem: Education: Goal: Understanding of CV disease, CV risk reduction, and recovery process will improve Outcome: Progressing Goal: Individualized Educational Video(s) Outcome: Progressing   Problem: Activity: Goal: Ability to return to baseline activity level will improve Outcome: Progressing   Problem: Cardiovascular: Goal: Ability to achieve and maintain adequate cardiovascular perfusion will improve Outcome: Progressing   

## 2019-09-20 DIAGNOSIS — I5022 Chronic systolic (congestive) heart failure: Secondary | ICD-10-CM

## 2019-09-20 DIAGNOSIS — I2510 Atherosclerotic heart disease of native coronary artery without angina pectoris: Secondary | ICD-10-CM | POA: Diagnosis not present

## 2019-09-20 DIAGNOSIS — I350 Nonrheumatic aortic (valve) stenosis: Secondary | ICD-10-CM

## 2019-09-20 DIAGNOSIS — I480 Paroxysmal atrial fibrillation: Secondary | ICD-10-CM | POA: Diagnosis not present

## 2019-09-20 DIAGNOSIS — Z955 Presence of coronary angioplasty implant and graft: Secondary | ICD-10-CM

## 2019-09-20 DIAGNOSIS — R001 Bradycardia, unspecified: Secondary | ICD-10-CM | POA: Diagnosis not present

## 2019-09-20 DIAGNOSIS — I4891 Unspecified atrial fibrillation: Secondary | ICD-10-CM

## 2019-09-20 LAB — HEMOGLOBIN AND HEMATOCRIT, BLOOD
HCT: 28.9 % — ABNORMAL LOW (ref 39.0–52.0)
HCT: 29.6 % — ABNORMAL LOW (ref 39.0–52.0)
Hemoglobin: 9.1 g/dL — ABNORMAL LOW (ref 13.0–17.0)
Hemoglobin: 9.4 g/dL — ABNORMAL LOW (ref 13.0–17.0)

## 2019-09-20 LAB — GLUCOSE, CAPILLARY
Glucose-Capillary: 134 mg/dL — ABNORMAL HIGH (ref 70–99)
Glucose-Capillary: 127 mg/dL — ABNORMAL HIGH (ref 70–99)
Glucose-Capillary: 184 mg/dL — ABNORMAL HIGH (ref 70–99)
Glucose-Capillary: 128 mg/dL — ABNORMAL HIGH (ref 70–99)
Glucose-Capillary: 139 mg/dL — ABNORMAL HIGH (ref 70–99)

## 2019-09-20 LAB — CBC
HCT: 28.4 % — ABNORMAL LOW (ref 39.0–52.0)
Hemoglobin: 9 g/dL — ABNORMAL LOW (ref 13.0–17.0)
MCH: 28.6 pg (ref 26.0–34.0)
MCHC: 31.7 g/dL (ref 30.0–36.0)
MCV: 90.2 fL (ref 80.0–100.0)
Platelets: 124 10*3/uL — ABNORMAL LOW (ref 150–400)
RBC: 3.15 MIL/uL — ABNORMAL LOW (ref 4.22–5.81)
RDW: 20.7 % — ABNORMAL HIGH (ref 11.5–15.5)
WBC: 8.7 10*3/uL (ref 4.0–10.5)
nRBC: 0.7 % — ABNORMAL HIGH (ref 0.0–0.2)

## 2019-09-20 LAB — BASIC METABOLIC PANEL
Anion gap: 10 (ref 5–15)
BUN: 46 mg/dL — ABNORMAL HIGH (ref 8–23)
CO2: 29 mmol/L (ref 22–32)
Calcium: 8.7 mg/dL — ABNORMAL LOW (ref 8.9–10.3)
Chloride: 104 mmol/L (ref 98–111)
Creatinine, Ser: 2.33 mg/dL — ABNORMAL HIGH (ref 0.61–1.24)
GFR calc Af Amer: 32 mL/min — ABNORMAL LOW (ref >60)
GFR calc non Af Amer: 28 mL/min — ABNORMAL LOW (ref >60)
Glucose, Bld: 153 mg/dL — ABNORMAL HIGH (ref 70–99)
Potassium: 4.7 mmol/L (ref 3.5–5.1)
Sodium: 143 mmol/L (ref 135–145)

## 2019-09-20 MED ORDER — FUROSEMIDE 10 MG/ML IJ SOLN
40.0000 mg | Freq: Once | INTRAMUSCULAR | Status: AC
  Administered 2019-09-20: 40 mg via INTRAVENOUS
  Filled 2019-09-20: qty 4

## 2019-09-20 MED ORDER — IPRATROPIUM-ALBUTEROL 0.5-2.5 (3) MG/3ML IN SOLN
3.0000 mL | Freq: Two times a day (BID) | RESPIRATORY_TRACT | Status: DC
  Administered 2019-09-20 – 2019-09-25 (×11): 3 mL via RESPIRATORY_TRACT
  Filled 2019-09-20 (×11): qty 3

## 2019-09-20 NOTE — Progress Notes (Signed)
PROGRESS NOTE    Justin Boone  EXH:[id number] DOB: December 08, 1950 DOA: 09/16/2019 PCP: Patient, No Pcp Per   Brief Narrative:  69 year old gentleman prior history of atrial fibrillation on Eliquis, diabetes mellitus, hypertension, COPD, obstructive sleep apnea on CPAP, ongoing tobacco use stage IIIb CKD, atrial fibrillation/flutter s/p cardioversion on amiodarone and Eliquis, chronic systolic heart failure, recent left heart catheterization in the setting of TAVR work-up on 08/12/2019, s/p drug-eluting stent to the LAD on Plavix and Eliquis presents to ED after a fall and left knee laceration.  X-rays of the left knee shows mild to moderate amount of infrapatellar soft tissue swelling and soft tissue air without evidence of acute abnormality.  Due to persistent bleeding orthopedics consulted and suggested to continue with compression wrapping and no indication of urgent washout of the knee in the OR.  Patient started bleeding profusely and persistently from the laceration soaking his dressing, bandages in the bed.  Patient's hemoglobin has dropped from 9.9-7.8 the last 24 hours from profuse bleeding from the left nasal laceration.  Discussed with pharmacy regarding Andexxa suggested that this is not a life-threatening bleeding and his last Eliquis dose was on the morning of 09/16/2019, he is out of the window and has been 24 hours since her last Eliquis dose.  He was given  K Centra and vitamin K respiratory anticoagulation antidote protocol. Patient's admission was changed to progressive unit and PCCM also consulted to see if he needs to be transferred to ICU for closer monitoring and persistent bleeding.  Patient seen and examined at bedside this morning no further bleeding from the left knee laceration. Orthopedics recommended follow-up for left knee laceration evaluation and dressing change in 1 to 2 weeks.  Called his cardiology office at [facility], who notified me that they are not going ahead with TAVR at this  time as his aortic stenosis is only moderate.  Therapy evals recommending SNF.    Assessment & Plan:   Principal Problem:   Symptomatic bradycardia Active Problems:   Paroxysmal atrial fibrillation (HCC)   QT prolongation   COPD with acute exacerbation (HCC)   AKI (acute kidney injury) (HCC)   Knee laceration, left, initial encounter   Chronic systolic heart failure (HCC)   Severe aortic stenosis   CAD (coronary artery disease)   S/P primary angioplasty with coronary stent   Left knee laceration with profuse and persistent bleeding on admission  Bleeding has been controlled with compression bandages which have been changed again by orthopedics.  1 dose of Kcentra given.  Orthopedics recommends weightbearing as tolerated on the left lower extremity and to keep the left knee extended and straight for 2 weeks and follow-up with orthopedics in 1 to 2 weeks for laceration evaluation and dressing changes.  Okay to use knee immobilizer for comfort/compliance of knee extension.  We will transition IV antibiotics to oral Keflex on discharge to complete the course,  elevate extremity and encourage ankle flexion and extension to facilitate movement. Transfuse to keep hemoglobin greater than 7. Pt reports his knee pain is improving.    Persistent dizziness, symptomatic bradycardia No bradycardia today. No dizziness.  Cardiology consulted, and on board, recommended to continue aspirin 81 mg and Plavix 75 mg and hold Eliquis for atleast one week.  Appreciate cardiology recommendations   Paroxysmal atrial flutter/fibrillation S/p cardioversion, rate not well controlled.  Anticoagulation on hold due to active bleeding from the knee laceration. .  Syncope Probably secondary to bradycardia, aortic stenosis. Await further recommendation from cardiology.  Type 2 diabetes mellitus  CBG (last 3)  Recent Labs    09/20/19 0619 09/20/19 1125 09/20/19 1645  GLUCAP 134* 127* 184*     Hemoglobin A1c is 6 Continue with SSI.    Chronic systolic heart failure Repeat echocardiogram has been ordered.    Acute anemia of blood loss from left knee laceration Unknown baseline hemoglobin. Transfused 4 units of prbc transfusion since admission, and repeat CBC shows hemoglobin is around 9.    Obstructive sleep apnea on CPAP at night continue the same.    Essential hypertension Blood pressure parameters are well controlled   COPD Tobacco abuse Counseled against tobacco use Patient continues to have expiratory wheezing continue with IV steroids 60 mg twice daily for another 24 hours     AKI on stage III CKD Ultrasound of the kidney did not show any hydronephrosis or obstruction. Admitted 2.3 this morning and improving   DVT prophylaxis: SCDs Code Status: FULL CODE.  Family Communication: none at bedside.  Disposition Plan:  . Patient came from: Home.             . Anticipated d/c place: SNF Barriers to d/c OR conditions which need to be met to effect a safe d/c:SNF placement, TOC consulted.   Consultants:   Orthopedics  Cardiology  PCCM.     Procedures:  Antimicrobials: cefazolin IV since admission.   Subjective: Knee pain is improving.   Objective: Vitals:   09/20/19 0500 09/20/19 0722 09/20/19 0807 09/20/19 1127  BP:  129/66 (!) 142/79 130/82  Pulse: 82  83   Resp: 17  15   Temp:  98.6 F (37 C)  98.6 F (37 C)  TempSrc:  Oral  Oral  SpO2: 98%  97%   Weight:      Height:        Intake/Output Summary (Last 24 hours) at 09/20/2019 1856 Last data filed at 09/20/2019 1700 Gross per 24 hour  Intake 920 ml  Output 3200 ml  Net -2280 ml   Filed Weights   09/18/19 0426 09/19/19 0558 09/20/19 0400  Weight: 95.7 kg 96.2 kg 96.2 kg    Examination:  General exam: alert and comfortable.  Respiratory system: wheezing has improved.  Cardiovascular system: S1S2, IRREGULARLY irregular, no JVD, pedal edema present.   gastrointestinal system: abd is soft, non tender non distended bowel sounds wnl.  Central nervous system: alert and oriented.  Extremities: left lower extremity bandaged. Bruised on the lateral aspect.  Skin: Bruising over the lateral aspect of the left leg Psychiatry: mood is appropriate.     Data Reviewed: I have personally reviewed following labs and imaging studies  CBC: Recent Labs  Lab 09/16/19 1817 09/16/19 1820 09/17/19 0821 09/17/19 1554 09/18/19 0550 09/18/19 1230 09/19/19 1223 09/19/19 1223 09/19/19 1814 09/19/19 2111 09/20/19 0533 09/20/19 1202 09/20/19 1811  WBC 8.3  --  8.6  --  9.2  --  9.4  --   --   --  8.7  --   --   NEUTROABS  --   --   --   --   --   --  8.1*  --   --   --   --   --   --   HGB 9.1*   < > 7.8*   < > 7.9*   < > 9.1*   < > 9.1* 9.0* 9.0* 9.1* 9.4*  HCT 29.4*   < > 25.4*   < > 24.8*   < >  28.8*   < > 28.2* 28.5* 28.4* 28.9* 29.6*  MCV 95.5  --  97.3  --  89.5  --  90.3  --   --   --  90.2  --   --   PLT 120*  --  120*  --  102*  --  97*  --   --   --  124*  --   --    < > = values in this interval not displayed.   Basic Metabolic Panel: Recent Labs  Lab 09/16/19 1817 09/16/19 1817 09/16/19 1820 09/16/19 2352 09/17/19 0821 09/18/19 0550 09/19/19 1223 09/20/19 0533  NA 137   < > 136  --  139 140 141 143  K 4.7   < > 4.4  --  4.6 4.7 4.2 4.7  CL 103   < > 104  --  102 103 102 104  CO2 24  --   --   --  25 28 28 29   GLUCOSE 134*   < > 122*  --  137* 174* 148* 153*  BUN 45*   < > 42*  --  48* 55* 50* 46*  CREATININE 2.87*   < > 3.00*  --  2.89* 2.77* 2.37* 2.33*  CALCIUM 8.9  --   --   --  8.8* 8.4* 8.6* 8.7*  MG  --   --   --  2.2  --   --   --   --    < > = values in this interval not displayed.   GFR: Estimated Creatinine Clearance: 37 mL/min (A) (by C-G formula based on SCr of 2.33 mg/dL (H)). Liver Function Tests: Recent Labs  Lab 09/16/19 1817  AST 18  ALT 15  ALKPHOS 110  BILITOT 1.1  PROT 5.7*  ALBUMIN 2.8*    No results for input(s): LIPASE, AMYLASE in the last 168 hours. No results for input(s): AMMONIA in the last 168 hours. Coagulation Profile: Recent Labs  Lab 09/16/19 1817 09/17/19 1200  INR 2.4* 1.7*   Cardiac Enzymes: No results for input(s): CKTOTAL, CKMB, CKMBINDEX, TROPONINI in the last 168 hours. BNP (last 3 results) No results for input(s): PROBNP in the last 8760 hours. HbA1C: No results for input(s): HGBA1C in the last 72 hours. CBG: Recent Labs  Lab 09/19/19 1652 09/19/19 2113 09/20/19 0619 09/20/19 1125 09/20/19 1645  GLUCAP 199* 174* 134* 127* 184*   Lipid Profile: No results for input(s): CHOL, HDL, LDLCALC, TRIG, CHOLHDL, LDLDIRECT in the last 72 hours. Thyroid Function Tests: No results for input(s): TSH, T4TOTAL, FREET4, T3FREE, THYROIDAB in the last 72 hours. Anemia Panel: No results for input(s): VITAMINB12, FOLATE, FERRITIN, TIBC, IRON, RETICCTPCT in the last 72 hours. Sepsis Labs: Recent Labs  Lab 09/16/19 1826 09/16/19 2352  PROCALCITON  --  <0.10  LATICACIDVEN 1.4  --     Recent Results (from the past 240 hour(s))  Respiratory Panel by RT PCR (Flu A&B, Covid) - Nasopharyngeal Swab     Status: None   Collection Time: 09/16/19  8:24 PM   Specimen: Nasopharyngeal Swab  Result Value Ref Range Status   SARS Coronavirus 2 by RT PCR NEGATIVE NEGATIVE Final    Comment: (NOTE) SARS-CoV-2 target nucleic acids are NOT DETECTED. The SARS-CoV-2 RNA is generally detectable in upper respiratoy specimens during the acute phase of infection. The lowest concentration of SARS-CoV-2 viral copies this assay can detect is 131 copies/mL. A negative result does not preclude SARS-Cov-2 infection and should not be  used as the sole basis for treatment or other patient management decisions. A negative result may occur with  improper specimen collection/handling, submission of specimen other than nasopharyngeal swab, presence of viral mutation(s) within the areas  targeted by this assay, and inadequate number of viral copies (<131 copies/mL). A negative result must be combined with clinical observations, patient history, and epidemiological information. The expected result is Negative. Fact Sheet for Patients:  PinkCheek.be Fact Sheet for Healthcare Providers:  GravelBags.it This test is not yet ap proved or cleared by the [place] FDA and  has been authorized for detection and/or diagnosis of SARS-CoV-2 by FDA under an Emergency Use Authorization (EUA). This EUA will remain  in effect (meaning this test can be used) for the duration of the COVID-19 declaration under Section 564(b)(1) of the Act, 21 U.S.C. section 360bbb-3(b)(1), unless the authorization is terminated or revoked sooner.    Influenza A by PCR NEGATIVE NEGATIVE Final   Influenza B by PCR NEGATIVE NEGATIVE Final    Comment: (NOTE) The Xpert Xpress SARS-CoV-2/FLU/RSV assay is intended as an aid in  the diagnosis of influenza from Nasopharyngeal swab specimens and  should not be used as a sole basis for treatment. Nasal washings and  aspirates are unacceptable for Xpert Xpress SARS-CoV-2/FLU/RSV  testing. Fact Sheet for Patients: PinkCheek.be Fact Sheet for Healthcare Providers: GravelBags.it This test is not yet approved or cleared by the [place] FDA and  has been authorized for detection and/or diagnosis of SARS-CoV-2 by  FDA under an Emergency Use Authorization (EUA). This EUA will remain  in effect (meaning this test can be used) for the duration of the  Covid-19 declaration under Section 564(b)(1) of the Act, 21  U.S.C. section 360bbb-3(b)(1), unless the authorization is  terminated or revoked. Performed at [facility], [place] [address], [place], [place] [address]          Radiology Studies: No results found.      Scheduled  Meds: . aspirin  81 mg Oral Daily  . budesonide (PULMICORT) nebulizer solution  0.25 mg Nebulization BID  . clopidogrel  75 mg Oral Daily  . dextromethorphan-guaiFENesin  1 tablet Oral BID  . DULoxetine  60 mg Oral Daily  . feeding supplement (GLUCERNA SHAKE)  237 mL Oral TID BM  . insulin aspart  0-5 Units Subcutaneous QHS  . insulin aspart  0-9 Units Subcutaneous TID WC  . ipratropium-albuterol  3 mL Nebulization BID  . lidocaine-EPINEPHrine  20 mL Intradermal Once  . methylPREDNISolone (SOLU-MEDROL) injection  60 mg Intravenous Q12H  . multivitamin with minerals  1 tablet Oral Daily  . tamsulosin  0.4 mg Oral QPC breakfast   Continuous Infusions: .  ceFAZolin (ANCEF) IV 2 g (09/20/19 1711)     LOS: 4 days       Hosie Poisson, MD [facility]   To contact the attending provider between 7A-7P or the covering provider during after hours 7P-7A, please log into the web site [email or web address] and access using universal [place] password for that web site. If you do not have the password, please call the hospital operator.  09/20/2019, 6:56 PM

## 2019-09-20 NOTE — Progress Notes (Addendum)
2 am HHN held per conversation with pt.  Patient indicated that he will call if he is SOB.

## 2019-09-20 NOTE — Evaluation (Signed)
Physical Therapy Evaluation Patient Details Name: Justin Boone MRN: [id number] DOB: 09-08-1950 Today's Date: 09/20/2019   History of Present Illness  Pt is a 69 y/o male admitted following fall secondary to symptomatic bradycardia. Pt sustained L knee laceration that required sutures. PMH includes COPD, a fib, CKD, CHF, CAD s/p stent placement, OSA on CPAP, and tobacco use.   Clinical Impression  Pt admitted secondary to problem above with deficits below. Pt requiring min A to sit at EOB and to maintain knee extension throughout in LLE. Pt with increased pain at EOB, and requesting to defer further mobility. Pt HR elevating to mid 130s during mobility. Feel pt would benefit from [place] to maintain precautions; notified RN. Pt reports living alone and is hoping his son can stay with him, however, is unsure. Given current mobility limitations, feel pt may benefit from SNF level therapies prior to return home. However, if pt progresses well and has assist from son, may be able to d/c home with HHPT. Will continue to follow acutely and update recommendations based on pt progression.     Follow Up Recommendations SNF;Supervision for mobility/OOB(vs HHPT pending progression with mobility )    Equipment Recommendations  3in1 (PT)    Recommendations for Other Services       Precautions / Restrictions Precautions Precautions: Fall;Other (comment) Precaution Comments: Must keep knee extended; no flexion  Required Braces or Orthoses: Knee Immobilizer - Left Knee Immobilizer - Left: Other (comment)(for comfort and to help with keeping knee straight. ) Restrictions Weight Bearing Restrictions: Yes LLE Weight Bearing: Weight bearing as tolerated      Mobility  Bed Mobility Overal bed mobility: Needs Assistance Bed Mobility: Supine to Sit;Sit to Supine     Supine to sit: Min assist Sit to supine: Min assist   General bed mobility comments: Min A for LLE assist and to keep LLE straight. Pt with  increased pain in LLE upon sitting and requesting to defer further mobility. Pt also with difficult time maintaining knee extension without assist. HR elevating to mid 130s with sitting at EOB.   Transfers                    Ambulation/Gait                Stairs            Wheelchair Mobility    Modified Rankin (Stroke Patients Only)       Balance Overall balance assessment: Needs assistance Sitting-balance support: No upper extremity supported;Feet supported Sitting balance-Leahy Scale: Fair                                       Pertinent Vitals/Pain Pain Assessment: 0-10 Pain Score: 7  Pain Location: L knee Pain Descriptors / Indicators: Grimacing;Guarding;Aching Pain Intervention(s): Limited activity within patient's tolerance;Monitored during session;Repositioned    Home Living Family/patient expects to be discharged to:: Private residence Living Arrangements: Alone Available Help at Discharge: Family Type of Home: House Home Access: Stairs to enter   Technical brewer of Steps: 1(threshold) Home Layout: One level Home Equipment: Environmental consultant - 2 wheels;Shower seat;Grab bars - tub/shower Additional Comments: Reports son may be able to stay with him, but unsure.     Prior Function Level of Independence: Independent               Hand Dominance  Extremity/Trunk Assessment   Upper Extremity Assessment Upper Extremity Assessment: Defer to OT evaluation    Lower Extremity Assessment Lower Extremity Assessment: LLE deficits/detail LLE Deficits / Details: Limited ROM secondary to knee precautions and L knee laceration. Was able to perform SLR and ankle pump.     Cervical / Trunk Assessment Cervical / Trunk Assessment: Normal  Communication   Communication: No difficulties  Cognition Arousal/Alertness: Awake/alert Behavior During Therapy: WFL for tasks assessed/performed Overall Cognitive Status: Within  Functional Limits for tasks assessed                                        General Comments      Exercises General Exercises - Lower Extremity Ankle Circles/Pumps: AROM;Both;10 reps;Supine   Assessment/Plan    PT Assessment Patient needs continued PT services  PT Problem List Decreased strength;Decreased range of motion;Decreased activity tolerance;Decreased balance;Decreased mobility;Decreased knowledge of use of DME;Decreased knowledge of precautions;Pain       PT Treatment Interventions Gait training;DME instruction;Functional mobility training;Therapeutic activities;Therapeutic exercise;Balance training;Stair training;Patient/family education    PT Goals (Current goals can be found in the Care Plan section)  Acute Rehab PT Goals Patient Stated Goal: to decrease pain  PT Goal Formulation: With patient Time For Goal Achievement: 10/04/19 Potential to Achieve Goals: Good    Frequency Min 3X/week   Barriers to discharge Decreased caregiver support      Co-evaluation               AM-PAC PT "6 Clicks" Mobility  Outcome Measure Help needed turning from your back to your side while in a flat bed without using bedrails?: A Little Help needed moving from lying on your back to sitting on the side of a flat bed without using bedrails?: A Little Help needed moving to and from a bed to a chair (including a wheelchair)?: A Lot Help needed standing up from a chair using your arms (e.g., wheelchair or bedside chair)?: A Lot Help needed to walk in hospital room?: A Lot Help needed climbing 3-5 steps with a railing? : Total 6 Click Score: 13    End of Session   Activity Tolerance: Patient limited by pain Patient left: in bed;with call bell/phone within reach;Other (comment)(with MD present ) Nurse Communication: Mobility status;Other (comment)(pt needs KI ) PT Visit Diagnosis: Difficulty in walking, not elsewhere classified (R26.2);Pain Pain - Right/Left:  Left Pain - part of body: Knee    Time: 3734-2876 PT Time Calculation (min) (ACUTE ONLY): 21 min   Charges:   PT Evaluation $PT Eval Moderate Complexity: 1 Mod          Reuel Derby, PT, DPT  Acute Rehabilitation Services  Pager: [phone] Office: [phone]   Rudean Hitt 09/20/2019, 11:29 AM

## 2019-09-20 NOTE — Progress Notes (Signed)
Chaplain engaged in initial visit with Justin Boone.  Kaidyn explained that he wants his son to be present before completing paperwork.  Chaplain told Altair to have nurse page her when son has arrived and they are ready to go over Advanced Directive.  Chaplain will follow-up.

## 2019-09-20 NOTE — Evaluation (Signed)
Occupational Therapy Evaluation Patient Details Name: Justin Boone MRN: [id number] DOB: 04/25/1951 Today's Date: 09/20/2019    History of Present Illness Pt is a 69 y/o male admitted following fall secondary to symptomatic bradycardia. Pt sustained L knee laceration that required sutures. PMH includes COPD, a fib, CKD, CHF, CAD s/p stent placement, OSA on CPAP, and tobacco use.    Clinical Impression   Pt is typically independent. Limited evaluation today due to pain in L knee and intolerance to attempt OOB.  Began educating pt in compensatory strategies and use of AE for LB ADL. Pt likely to progress well once he can tolerate OOB. Recommending SNF at this time, but may change to [facility] next visit.    Follow Up Recommendations  SNF(depending on progress, pt prefers to go home)    Equipment Recommendations  None recommended by OT    Recommendations for Other Services       Precautions / Restrictions Precautions Precautions: Fall Precaution Comments: Must keep knee extended; no flexion    Restrictions Weight Bearing Restrictions: Yes LLE Weight Bearing: Weight bearing as tolerated      Mobility Bed Mobility Overal bed mobility: Needs Assistance Bed Mobility: Supine to Sit;Sit to Supine     Supine to sit: Min assist Sit to supine: Min assist   General bed mobility comments: min assist to manage L LE  Transfers                 General transfer comment: declined due to pain    Balance Overall balance assessment: Needs assistance Sitting-balance support: No upper extremity supported;Feet supported Sitting balance-Leahy Scale: Fair                                     ADL either performed or assessed with clinical judgement   ADL Overall ADL's : Needs assistance/impaired Eating/Feeding: Independent;Bed level   Grooming: Set up;Sitting   Upper Body Bathing: Set up;Sitting   Lower Body Bathing: Maximal assistance;Sitting/lateral leans Lower  Body Bathing Details (indicate cue type and reason): recommended long handled bath sponge Upper Body Dressing : Set up;Sitting   Lower Body Dressing: Maximal assistance;Sitting/lateral leans Lower Body Dressing Details (indicate cue type and reason): began educating pt in use of reacher and to dress L LE first               General ADL Comments: pt declined attempt to stand     Vision Patient Visual Report: No change from baseline       Perception     Praxis      Pertinent Vitals/Pain Pain Assessment: 0-10 Pain Score: 7  Pain Location: L knee Pain Descriptors / Indicators: Grimacing;Guarding;Aching Pain Intervention(s): Limited activity within patient's tolerance;Monitored during session;Repositioned     Hand Dominance Right   Extremity/Trunk Assessment Upper Extremity Assessment Upper Extremity Assessment: RUE deficits/detail RUE Deficits / Details: longstanding shoulder limitations RUE Coordination: decreased gross motor   Lower Extremity Assessment Lower Extremity Assessment: Defer to PT evaluation LLE Deficits / Details: Limited ROM secondary to knee precautions and L knee laceration. Was able to perform SLR and ankle pump.    Cervical / Trunk Assessment Cervical / Trunk Assessment: Normal   Communication Communication Communication: No difficulties   Cognition Arousal/Alertness: Awake/alert Behavior During Therapy: WFL for tasks assessed/performed Overall Cognitive Status: Within Functional Limits for tasks assessed  General Comments       Exercises    Shoulder Instructions      Home Living Family/patient expects to be discharged to:: Private residence Living Arrangements: Alone Available Help at Discharge: Family Type of Home: House Home Access: Stairs to enter [employer] of Steps: 1   Home Layout: One level     Bathroom Shower/Tub: Teacher, early years/pre:  [employer]: Environmental consultant - 2 wheels;Shower seat;Grab bars - tub/shower;Bedside commode;Adaptive equipment Adaptive Equipment: Reacher Additional Comments: Reports son may be able to stay with him, but unsure.       Prior Functioning/Environment Level of Independence: Independent        Comments: drives        OT Problem List: Pain;Decreased knowledge of use of DME or AE;Decreased knowledge of precautions      OT Treatment/Interventions: Self-care/ADL training;DME and/or AE instruction;Balance training;Patient/family education;Therapeutic activities    OT Goals(Current goals can be found in the care plan section) Acute Rehab OT Goals Patient Stated Goal: to decrease pain  OT Goal Formulation: With patient Time For Goal Achievement: 10/04/19 Potential to Achieve Goals: Good ADL Goals Pt Will Perform Grooming: with supervision;standing Pt Will Perform Lower Body Bathing: with supervision;sit to/from stand;with adaptive equipment Pt Will Perform Lower Body Dressing: with supervision;with adaptive equipment Pt Will Transfer to Toilet: with supervision;bedside commode;ambulating Pt Will Perform Toileting - Clothing Manipulation and hygiene: with supervision;sit to/from stand Additional ADL Goal #1: Pt will perform bed mobility modified independently in preparation for ADL.  OT Frequency: Min 2X/week   Barriers to D/C:            Co-evaluation              AM-PAC OT "6 Clicks" Daily Activity     Outcome Measure Help from another person eating meals?: None Help from another person taking care of personal grooming?: A Little Help from another person toileting, which includes using toliet, bedpan, or urinal?: A Lot Help from another person bathing (including washing, rinsing, drying)?: A Lot Help from another person to put on and taking off regular upper body clothing?: A Little Help from another person to put on and taking off regular lower body clothing?:  Total 6 Click Score: 15   End of Session    Activity Tolerance: Patient limited by pain Patient left: in bed;with call bell/phone within reach  OT Visit Diagnosis: Pain                Time: 5188-4166 OT Time Calculation (min): 22 min Charges:  OT General Charges $OT Visit: 1 Visit OT Evaluation $OT Eval Moderate Complexity: 1 Mod  Nestor Lewandowsky, OTR/L Acute Rehabilitation Services Pager: [phone] Office: [phone]  Malka So 09/20/2019, 1:27 PM

## 2019-09-20 NOTE — Progress Notes (Signed)
RT instructed patient on the use of flutter valve. Patient able to demonstrate back good technique.   

## 2019-09-20 NOTE — Progress Notes (Signed)
Patient is on his home cpap machine.  Saturation 97%

## 2019-09-20 NOTE — Progress Notes (Signed)
Progress Note  Patient Name: Justin Boone Date of Encounter: 09/20/2019  Primary Cardiologist: Posey Boyer, MD   Subjective   Tried to work with PT today, but only able to sit up on side of bed. Heart rate went to 140s with even this activity. He notes at home that when he was on no medications, his heart rate consistently went 140s-150s. Denies chest pain. Breathing is still short, occasionally coughing up phlegm.   He tells me that his TAVR procedure scheduled for 09/22/19 is now on hold, as they want him to get stronger first.   Inpatient Medications    Scheduled Meds: . aspirin  81 mg Oral Daily  . budesonide (PULMICORT) nebulizer solution  0.25 mg Nebulization BID  . clopidogrel  75 mg Oral Daily  . dextromethorphan-guaiFENesin  1 tablet Oral BID  . DULoxetine  60 mg Oral Daily  . feeding supplement (GLUCERNA SHAKE)  237 mL Oral TID BM  . insulin aspart  0-5 Units Subcutaneous QHS  . insulin aspart  0-9 Units Subcutaneous TID WC  . ipratropium-albuterol  3 mL Nebulization BID  . lidocaine-EPINEPHrine  20 mL Intradermal Once  . methylPREDNISolone (SOLU-MEDROL) injection  60 mg Intravenous Q12H  . multivitamin with minerals  1 tablet Oral Daily  . tamsulosin  0.4 mg Oral QPC breakfast   Continuous Infusions: .  ceFAZolin (ANCEF) IV Stopped (09/20/19 0618)   PRN Meds: acetaminophen **OR** acetaminophen, albuterol, HYDROcodone-acetaminophen, morphine injection   Vital Signs    Vitals:   09/20/19 0500 09/20/19 0722 09/20/19 0807 09/20/19 1127  BP:  129/66 (!) 142/79 130/82  Pulse: 82  83   Resp: 17  15   Temp:  98.6 F (37 C)  98.6 F (37 C)  TempSrc:  Oral  Oral  SpO2: 98%  97%   Weight:      Height:        Intake/Output Summary (Last 24 hours) at 09/20/2019 1128 Last data filed at 09/20/2019 0838 Gross per 24 hour  Intake 700 ml  Output 1300 ml  Net -600 ml   Last 3 Weights 09/20/2019 09/19/2019 09/18/2019  Weight (lbs) 212 lb 212 lb 211 lb  Weight  (kg) 96.163 kg 96.163 kg 95.709 kg      Telemetry    Atrial fibrillation, with intermittent RVR - Personally Reviewed  ECG    No new since 09/17/19 - Personally Reviewed  Physical Exam   GEN: No acute distress.  Appears older than stated age. Neck: JVD low neck at 45 degrees Cardiac: tachycardic, irregularly irregular, 2/6 SEM, no rubs or gallops.  Respiratory: Coarse diffusely, with bilateral expiratory wheeze and rhonchi GI: Soft, nontender, non-distended  MS: trivial bilateral LE edema; No deformity. Neuro:  Nonfocal  Psych: Normal affect   Labs    High Sensitivity Troponin:  No results for input(s): TROPONINIHS in the last 720 hours.    Chemistry Recent Labs  Lab 09/16/19 1817 09/16/19 1820 09/18/19 0550 09/19/19 1223 09/20/19 0533  NA 137   < > 140 141 143  K 4.7   < > 4.7 4.2 4.7  CL 103   < > 103 102 104  CO2 24   < > 28 28 29   GLUCOSE 134*   < > 174* 148* 153*  BUN 45*   < > 55* 50* 46*  CREATININE 2.87*   < > 2.77* 2.37* 2.33*  CALCIUM 8.9   < > 8.4* 8.6* 8.7*  PROT 5.7*  --   --   --   --  ALBUMIN 2.8*  --   --   --   --   AST 18  --   --   --   --   ALT 15  --   --   --   --   ALKPHOS 110  --   --   --   --   BILITOT 1.1  --   --   --   --   GFRNONAA 22*   < > 23* 27* 28*  GFRAA 25*   < > 26* 32* 32*  ANIONGAP 10   < > 9 11 10    < > = values in this interval not displayed.     Hematology Recent Labs  Lab 09/18/19 0550 09/18/19 1230 09/19/19 1223 09/19/19 1223 09/19/19 1814 09/19/19 2111 09/20/19 0533  WBC 9.2  --  9.4  --   --   --  8.7  RBC 2.77*  --  3.19*  --   --   --  3.15*  HGB 7.9*   < > 9.1*   < > 9.1* 9.0* 9.0*  HCT 24.8*   < > 28.8*   < > 28.2* 28.5* 28.4*  MCV 89.5  --  90.3  --   --   --  90.2  MCH 28.5  --  28.5  --   --   --  28.6  MCHC 31.9  --  31.6  --   --   --  31.7  RDW 22.2*  --  20.6*  --   --   --  20.7*  PLT 102*  --  97*  --   --   --  124*   < > = values in this interval not displayed.    BNPNo results for  input(s): BNP, PROBNP in the last 168 hours.   DDimer No results for input(s): DDIMER in the last 168 hours.   Radiology    No results found.  Cardiac Studies   Echo 09/17/19 1. Left ventricular ejection fraction, by estimation, is 40 to 45%. The  left ventricle has mildly decreased function. The left ventricle  demonstrates global hypokinesis. The left ventricular internal cavity size  was mildly dilated. Left ventricular  diastolic parameters are consistent with Grade II diastolic dysfunction  (pseudonormalization). Elevated left atrial pressure.  2. Right ventricular systolic function is mildly reduced. The right  ventricular size is mildly enlarged. There is moderately elevated  pulmonary artery systolic pressure.  3. Left atrial size was mildly dilated.  4. Right atrial size was mildly dilated.  5. The mitral valve is normal in structure. Mild mitral valve  regurgitation.  6. The aortic valve is tricuspid. Aortic valve regurgitation is mild.  Severe aortic valve stenosis.  7. The inferior vena cava is dilated in size with <50% respiratory  variability, suggesting right atrial pressure of 15 mmHg.   Patient Profile     69 y.o. male with known severe AS, planned for TAVR at [facility] 09/22/19, paroxysmal atrial fibrillation, chronic systolic dysfunction with EF 40%, CAD with DES to RCA 08/13/19, hypertension, type II diabetes, chronic kidney disease who was admitted/for whom we are consulted for symptomatic bradycardia and possible syncope.  Assessment & Plan    Severe aortic stenosis, presumed low flow-low gradient given EF 40%: -initially planned for TAVR 09/22/19 at [facility] This has been placed on hold per the patient given his current admission  Chronic systolic heart failure: -appears mildly volume up today. Will give one dose of lasix  today. -unclear if weights accurate. Noted as 86.2 kg on admission, currently 96.2 kg -net positive 100 CC< but this does not appear accurate  during first several days of admission -has chronic kidney disease, monitor Cr  Symptomatic bradycardia: -metoprolol and amiodarone on hold -he is high likelihood of requiring pacemaker post TAVR. -he now has atrial fibrillation with RVR with even minor movements, such as sitting on the edge of the bed. Does come down slightly with rest. However, we are limited in our options for treatment of this -will monitor, but if RVR persists, will ask EP to evaluate for pacemaker this admission (while off anticoagulation) given tachy-brady syndrome.  Paroxysmal atrial fibrillation: -metoprolol, amiodarone on hold given symptomatic bradycardia -DOAC on hold given knee laceration that required OR and Kcentra -if no plans for pacemaker this admission, and if TAVR on hold, will need to discuss timing of DOAC re-initiation -CHA2DS2/VAS Stroke Risk Points=3 (age, HF, CAD)  CAD with recent DES 08/13/19: -continue aspirin, clopidogrel given plans to hold DOAC prior to TAVR -if DOAC restarted, drop aspirin and continue clopidogrel and DOAC  For questions or updates, please contact [place] Please consult [email or web address] for contact info under    Signed, Buford Dresser, MD  09/20/2019, 11:28 AM

## 2019-09-21 DIAGNOSIS — I495 Sick sinus syndrome: Secondary | ICD-10-CM | POA: Diagnosis not present

## 2019-09-21 DIAGNOSIS — R55 Syncope and collapse: Secondary | ICD-10-CM | POA: Diagnosis not present

## 2019-09-21 DIAGNOSIS — R001 Bradycardia, unspecified: Secondary | ICD-10-CM | POA: Diagnosis not present

## 2019-09-21 DIAGNOSIS — I2510 Atherosclerotic heart disease of native coronary artery without angina pectoris: Secondary | ICD-10-CM | POA: Diagnosis not present

## 2019-09-21 DIAGNOSIS — I5022 Chronic systolic (congestive) heart failure: Secondary | ICD-10-CM | POA: Diagnosis not present

## 2019-09-21 DIAGNOSIS — I480 Paroxysmal atrial fibrillation: Secondary | ICD-10-CM | POA: Diagnosis not present

## 2019-09-21 LAB — GLUCOSE, CAPILLARY
Glucose-Capillary: 165 mg/dL — ABNORMAL HIGH (ref 70–99)
Glucose-Capillary: 129 mg/dL — ABNORMAL HIGH (ref 70–99)
Glucose-Capillary: 156 mg/dL — ABNORMAL HIGH (ref 70–99)
Glucose-Capillary: 193 mg/dL — ABNORMAL HIGH (ref 70–99)

## 2019-09-21 LAB — BASIC METABOLIC PANEL
Anion gap: 10 (ref 5–15)
BUN: 45 mg/dL — ABNORMAL HIGH (ref 8–23)
CO2: 29 mmol/L (ref 22–32)
Calcium: 8.7 mg/dL — ABNORMAL LOW (ref 8.9–10.3)
Chloride: 100 mmol/L (ref 98–111)
Creatinine, Ser: 2.01 mg/dL — ABNORMAL HIGH (ref 0.61–1.24)
GFR calc Af Amer: 39 mL/min — ABNORMAL LOW (ref >60)
GFR calc non Af Amer: 33 mL/min — ABNORMAL LOW (ref >60)
Glucose, Bld: 159 mg/dL — ABNORMAL HIGH (ref 70–99)
Potassium: 4.3 mmol/L (ref 3.5–5.1)
Sodium: 139 mmol/L (ref 135–145)

## 2019-09-21 LAB — HEMOGLOBIN AND HEMATOCRIT, BLOOD
HCT: 31.3 % — ABNORMAL LOW (ref 39.0–52.0)
HCT: 30.9 % — ABNORMAL LOW (ref 39.0–52.0)
Hemoglobin: 10 g/dL — ABNORMAL LOW (ref 13.0–17.0)
Hemoglobin: 9.6 g/dL — ABNORMAL LOW (ref 13.0–17.0)

## 2019-09-21 LAB — MAGNESIUM: Magnesium: 2.3 mg/dL (ref 1.7–2.4)

## 2019-09-21 MED ORDER — PREDNISONE 20 MG PO TABS
40.0000 mg | ORAL_TABLET | Freq: Every day | ORAL | Status: DC
  Administered 2019-09-22 – 2019-09-23 (×2): 40 mg via ORAL
  Filled 2019-09-21 (×2): qty 2

## 2019-09-21 MED ORDER — SODIUM CHLORIDE 0.9 % IV SOLN
80.0000 mg | INTRAVENOUS | Status: AC
  Administered 2019-09-22: 80 mg
  Filled 2019-09-21: qty 2

## 2019-09-21 MED ORDER — SODIUM CHLORIDE 0.9 % IV SOLN: INTRAVENOUS | Status: DC

## 2019-09-21 MED ORDER — SODIUM CHLORIDE 0.45 % IV SOLN: INTRAVENOUS | Status: DC

## 2019-09-21 MED ORDER — CEFAZOLIN SODIUM-DEXTROSE 2-4 GM/100ML-% IV SOLN
2.0000 g | INTRAVENOUS | Status: AC
  Administered 2019-09-22: 2 g via INTRAVENOUS
  Filled 2019-09-21: qty 100

## 2019-09-21 NOTE — Progress Notes (Signed)
Occupational Therapy Treatment Patient Details Name: Lenwood Balsam MRN: [id number] DOB: May 15, 1951 Today's Date: 09/21/2019    History of present illness Pt is a 69 y/o male admitted following fall secondary to symptomatic bradycardia. Pt sustained L knee laceration that required sutures. PMH includes COPD, a fib, CKD, CHF, CAD s/p stent placement, OSA on CPAP, and tobacco use.    OT comments  Pt progressing to OOB ADL. Pt standing at sink ~15 mins for grooming tasks/bathing tasks requiring minA overall for righting reactions due to x3 LOB episodes. Pt with decreased strength and unable to perform functional activity without UE support. Pt requires encouragement for mobilizing, but also requires cues for rest breaks to avoid fatigue.Pt ambulating in room and hallway with minA overall for maneuvering RW.  ** Pt agrees that his safest option is using RW for mobility and going to SNF to increase independence.   HR 113-145 BPM with exertion. O2 >90% on RA.   Follow Up Recommendations  SNF    Equipment Recommendations  Other (comment)(deferred to next venue)    Recommendations for Other Services      Precautions / Restrictions Precautions Precautions: Fall Precaution Comments: Must keep knee extended; no flexion  Required Braces or Orthoses: Knee Immobilizer - Left Knee Immobilizer - Left: Other (comment)(for comfort to keep knee extended) Restrictions Weight Bearing Restrictions: Yes LLE Weight Bearing: Weight bearing as tolerated       Mobility Bed Mobility Overal bed mobility: Needs Assistance Bed Mobility: Supine to Sit     Supine to sit: Min guard     General bed mobility comments: Cues and assist for LLE to EOB  Transfers Overall transfer level: Needs assistance Equipment used: Rolling walker (2 wheeled) Transfers: Sit to/from Stand Sit to Stand: Min assist;+2 physical assistance         General transfer comment: minA +2 or modA +2 depending on fatigue and pain  in LLE>    Balance Overall balance assessment: Needs assistance Sitting-balance support: No upper extremity supported;Feet supported Sitting balance-Leahy Scale: Fair     Standing balance support: Bilateral upper extremity supported;Single extremity supported;During functional activity Standing balance-Leahy Scale: Poor Standing balance comment: Single UE required for balance; pt with x3 LOB episodes at sink                           ADL either performed or assessed with clinical judgement   ADL Overall ADL's : Needs assistance/impaired     Grooming: Minimal assistance;Standing Grooming Details (indicate cue type and reason): MinA for stability/safety as pt unstable at sink and unable to right reactions without assist. Upper Body Bathing: Min guard;Standing   Lower Body Bathing: Min guard;Cueing for safety;Cueing for sequencing;Sitting/lateral leans;Sit to/from stand Lower Body Bathing Details (indicate cue type and reason): Cleaning pericare area.                     Functional mobility during ADLs: Minimal assistance;+2 for safety/equipment(+2 for chair follow) General ADL Comments: Pt with decreased activity tolerance and requiring righting reactions for LOB episodes when standing at sink. Pt with decreased strength and unable to perform functional activity without UE support.     Vision   Vision Assessment?: No apparent visual deficits   Perception     Praxis      Cognition Arousal/Alertness: Awake/alert Behavior During Therapy: WFL for tasks assessed/performed Overall Cognitive Status: Within Functional Limits for tasks assessed  Exercises     Shoulder Instructions       General Comments HR 113-145 BPM with exertion. O2 >90% on RA. Pt requires encouragement for mobilizing, but also requires cues for rest breaks to avoid fatigue.    Pertinent Vitals/ Pain       Pain Assessment:  0-10 Pain Score: 8  Pain Location: L knee Pain Descriptors / Indicators: Grimacing;Guarding;Aching Pain Intervention(s): Monitored during session;Premedicated before session;Repositioned  Home Living                                          Prior Functioning/Environment              Frequency  Min 2X/week        Progress Toward Goals  OT Goals(current goals can now be found in the care plan section)  Progress towards OT goals: Progressing toward goals  Acute Rehab OT Goals Patient Stated Goal: to decrease pain  OT Goal Formulation: With patient Time For Goal Achievement: 10/04/19 Potential to Achieve Goals: Good ADL Goals Pt Will Perform Grooming: with supervision;standing Pt Will Perform Lower Body Bathing: with supervision;sit to/from stand;with adaptive equipment Pt Will Perform Lower Body Dressing: with supervision;with adaptive equipment Pt Will Transfer to Toilet: with supervision;bedside commode;ambulating Pt Will Perform Toileting - Clothing Manipulation and hygiene: with supervision;sit to/from stand Additional ADL Goal #1: Pt will perform bed mobility modified independently in preparation for ADL.  Plan Discharge plan needs to be updated    Co-evaluation                 AM-PAC OT "6 Clicks" Daily Activity     Outcome Measure   Help from another person eating meals?: None Help from another person taking care of personal grooming?: A Little Help from another person toileting, which includes using toliet, bedpan, or urinal?: A Little Help from another person bathing (including washing, rinsing, drying)?: A Lot Help from another person to put on and taking off regular upper body clothing?: A Little Help from another person to put on and taking off regular lower body clothing?: A Lot 6 Click Score: 17    End of Session Equipment Utilized During Treatment: Gait belt;Rolling walker  OT Visit Diagnosis: Unsteadiness on feet  (R26.81);Pain Pain - Right/Left: Left Pain - part of body: Knee   Activity Tolerance Patient tolerated treatment well   Patient Left Other (comment)(up with PT in hallway)   Nurse Communication Mobility status;Patient requests pain meds        Time: 1140-1215 OT Time Calculation (min): 35 min  Charges: OT General Charges $OT Visit: 1 Visit OT Treatments $Self Care/Home Management : 8-22 mins  Jefferey Pica, OTR/L Acute Rehabilitation Services Pager: [phone] Office: [phone]    Jefferey Pica 09/21/2019, 3:47 PM

## 2019-09-21 NOTE — Plan of Care (Signed)
  Problem: Education: Goal: Understanding of CV disease, CV risk reduction, and recovery process will improve Outcome: Progressing Goal: Individualized Educational Video(s) Outcome: Progressing   Problem: Activity: Goal: Ability to return to baseline activity level will improve Outcome: Progressing   

## 2019-09-21 NOTE — Progress Notes (Signed)
Orthopedic Tech Progress Note Patient Details:  Justin Boone Nov 14, 1950 [id number]  Ortho Devices Type of Ortho Device: Knee Immobilizer Ortho Device/Splint Location: LLE Ortho Device/Splint Interventions: Ordered, Application, Adjustment   Post Interventions Patient Tolerated: Well Instructions Provided: Care of device   [name] 09/21/2019, 11:10 AM

## 2019-09-21 NOTE — Progress Notes (Addendum)
Nutrition Follow-up  DOCUMENTATION CODES:   Not applicable  INTERVENTION:   -Continue Glucerna Shake po TID, each supplement provides 220 kcal and 10 grams of protein -Continue MVI with minerals daily  NUTRITION DIAGNOSIS:   Increased nutrient needs related to wound healing as evidenced by estimated needs.  Ongoing  GOAL:   Patient will meet greater than or equal to 90% of their needs  Progressing   MONITOR:   PO intake, Supplement acceptance, Labs, Weight trends, Skin, I & O's  REASON FOR ASSESSMENT:   Malnutrition Screening Tool    ASSESSMENT:   Justin Boone is a 69 y.o. male with medical history significant of A. fib on Eliquis, CHF, diabetes, hypertension, COPD presenting to the ED for evaluation of head injury and a large laceration to the left knee after a fall.  Patient states he has been feeling very weak and dizzy this past week.  He has noticed that his heart "beats and then stops beating for a long time."  Today he fell twice and injured his left knee and left shoulder.  During the second fall he even hit his head and lost consciousness.  He takes metoprolol and Eliquis.  Not sure which other medications he takes as he does not have them with him at this time.  His cardiologist is outside of [place] and he cannot recall the name at this time.  States he has been told that he has problems with aorta and another heart valve.  Reports having chronic shortness of breath and cough secondary to COPD, no recent change.  Denies chest pain.  Denies fevers, nausea, vomiting, abdominal pain, or diarrhea.  Reviewed I/O's: -3.3 L x 24 hours and -3.4 L since admission  UOP: 4 L x 24 hours  Per cardiology notes, plan to determine need for pacemaker due to tachycardia.   Pt sitting up in bed at time of visit. He reports good appetite PTA, consuming 3 meals per day. However, he shares he has been eating much less here than he does at home and voices displeasure with diet  restrictions ("I don't need that daggum heart healthy mess, I need real food"). RD attempted to allow pt to voice his concerns and explain rationale for diet restriction, however, pt took personal call during visit and did not proceed further with RD interview. He did allow RD to complete nutrition focused physical exam.   Pt has been consuming Glucerna supplements- noted finished supplement on tray table.  Per TOC team notes, plan to discharge with home health services to pt's son's home.   Medications reviewed and include prednisone.   Labs reviewed: CBGS: 129-165 (inpatient orders for glycemic control are 0-5 units insulin aspart q HS and 0-9 units insulin aspart TID with meals).   Nutrition-Focused Physical Exam   Most Recent Value  Orbital Region  No depletion  Upper Arm Region  No depletion  Thoracic and Lumbar Region  No depletion  Buccal Region  No depletion  Temple Region  No depletion  Clavicle Bone Region  No depletion  Clavicle and Acromion Bone Region  No depletion  Scapular Bone Region  No depletion  Dorsal Hand  No depletion  Patellar Region  No depletion  Anterior Thigh Region  No depletion  Posterior Calf Region  No depletion  Edema (RD Assessment)  Mild  Hair  Reviewed  Eyes  Reviewed  Mouth  Reviewed  Skin  Reviewed  Nails  Reviewed      Diet Order:  Diet Order            Diet heart healthy/carb modified Room service appropriate? Yes; Fluid consistency: Thin  Diet effective now              EDUCATION NEEDS:   Education needs have been addressed  Skin:  Skin Assessment: Reviewed RN Assessment  Last BM:  09/17/19  Height:   Ht Readings from Last 1 Encounters:  09/16/19 6' (1.829 m)    Weight:   Wt Readings from Last 1 Encounters:  09/21/19 95.7 kg    Ideal Body Weight:  80.9 kg  BMI:  Body mass index is 28.62 kg/m.  Estimated Nutritional Needs:   Kcal:  2000-2200  Protein:  105-120 grams  Fluid:  > 2 L    Loistine Chance, RD,  LDN, [place] Registered Dietitian II Certified Diabetes Care and Education Specialist Please refer to [facility] for RD and/or RD on-call/weekend/after hours pager

## 2019-09-21 NOTE — TOC Initial Note (Addendum)
Transition of Care [facility]) - Initial/Assessment Note    Patient Details  Name: Justin Boone MRN: [id number] Date of Birth: 1951-04-29  Transition [facility] CM/SW Contact:    Alberteen Sam, LCSW Phone Number: 09/21/2019, 9:21 AM  Clinical Narrative:                  Update: Justin Boone called back and reports discharge plan is for patient to stay with him after discharge. His address is : [place], [place] [address]  Justin Boone is in agreement with home health services to be set up, CSW will make referrals to agencies in the area. Justin Boone reports it takes him about 2.5 hours to get to hospital so if he could get prior notice to when patient will be discharged so he can arrive in a timely manner.      CSW spoke with patient's son Justin Boone regarding discharge planning and SNF rec. Justin Boone reports patient prefers to go home as noted in PT/OT notes as well. Justin Boone states patient can go home with [facility] and additional private caregiver support, or go to his home. Justin Boone also considering SNF, however with his insurance would potentially like patient to wait on SNF and use his SNF days after anticipated surgery in the near future. Justin Boone states he will discuss with patient/family the options and let CSW know what they decide as discharge plan.   Expected Discharge Plan: [place] Barriers to Discharge: Continued Medical Work up   Patient Goals and CMS Choice   CMS Medicare.gov Compare Post Acute Care list provided to:: Patient Represenative (must comment)(son Justin Boone) Choice offered to / list presented to : Adult Children  Expected Discharge Plan and Services Expected Discharge Plan: [place]       Living arrangements for the past 2 months: Single Family Home                                      Prior Living Arrangements/Services Living arrangements for the past 2 months: Single Family Home Lives with:: Self Patient language and need for interpreter  reviewed:: Yes Do you feel safe going back to the place where you live?: Yes      Need for Family Participation in Patient Care: Yes (Comment) Care giver support system in place?: Yes (comment)   Criminal Activity/Legal Involvement Pertinent to Current Situation/Hospitalization: No - Comment as needed  Activities of Daily Living Home Assistive Devices/Equipment: None ADL Screening (condition at time of admission) Patient's cognitive ability adequate to safely complete daily activities?: Yes Is the patient deaf or have difficulty hearing?: No Does the patient have difficulty seeing, even when wearing glasses/contacts?: No Does the patient have difficulty concentrating, remembering, or making decisions?: No Patient able to express need for assistance with ADLs?: Yes Does the patient have difficulty dressing or bathing?: No Independently performs ADLs?: Yes (appropriate for developmental age) Does the patient have difficulty walking or climbing stairs?: Yes Weakness of Legs: Both Weakness of Arms/Hands: None  Permission Sought/Granted Permission sought to share information with : Case Manager, Customer service manager, Family Supports Permission granted to share information with : Yes, Verbal Permission Granted  Share Information with NAME: Justin Boone  Permission granted to share info w AGENCY: SNFs/HH  Permission granted to share info w Relationship: son  Permission granted to share info w Contact Information: [phone]  Emotional Assessment  Admission diagnosis:  Pleural effusion [J90] Fall [W19.XXXA] Symptomatic bradycardia [R00.1] AKI (acute kidney injury) ([place]) [N17.9] Laceration of left knee, initial encounter [S81.012A] Knee laceration, left, initial encounter [S81.012A] Patient Active Problem List   Diagnosis Date Noted  . Chronic systolic heart failure ([place]) 09/20/2019  . Severe aortic stenosis 09/20/2019  . CAD (coronary artery disease) 09/20/2019   . S/P primary angioplasty with coronary stent 09/20/2019  . QT prolongation 09/17/2019  . COPD with acute exacerbation ([place]) 09/17/2019  . AKI (acute kidney injury) ([place]) 09/17/2019  . Knee laceration, left, initial encounter 09/17/2019  . Symptomatic bradycardia 09/16/2019  . Paroxysmal atrial fibrillation ([place])    PCP:  Patient, No Pcp Per Pharmacy:   [facility] [place], [employer]. [id number] [place] [address] Phone: [phone] Fax: [phone]     Social Determinants of Health (SDOH) Interventions    Readmission Risk Interventions No flowsheet data found.

## 2019-09-21 NOTE — Progress Notes (Signed)
PROGRESS NOTE    Justin Boone  QGB:[id number] DOB: [birth year] DOA: 09/16/2019 PCP: Patient, No Pcp Per   Brief Narrative:  69 year old gentleman prior history of atrial fibrillation on Eliquis, diabetes mellitus, hypertension, COPD, obstructive sleep apnea on CPAP, ongoing tobacco use stage IIIb CKD, atrial fibrillation/flutter s/p cardioversion on amiodarone and Eliquis, chronic systolic heart failure, recent left heart catheterization in the setting of TAVR work-up on 08/12/2019, s/p drug-eluting stent to the LAD on Plavix and Eliquis presents to ED after a fall and left knee laceration.  X-rays of the left knee shows mild to moderate amount of infrapatellar soft tissue swelling and soft tissue air without evidence of acute abnormality.  Due to persistent bleeding orthopedics consulted and suggested to continue with compression wrapping and no indication of urgent washout of the knee in the OR.  Patient started bleeding profusely and persistently from the laceration soaking his dressing, bandages in the bed.  Patient's hemoglobin has dropped from 9.9-7.8 the last 24 hours from profuse bleeding from the left knee laceration.  Discussed with pharmacy regarding Andexxa suggested that this is not a life-threatening bleeding and his last Eliquis dose was on the morning of 09/16/2019, he is out of the window and has been 24 hours since her last Eliquis dose.  He was given  K Centra and vitamin K respiratory anticoagulation antidote protocol. His bleeding has stopped since then and Orthopedics recommended follow-up for left knee laceration evaluation and dressing change in 1 to 2 weeks. Called his cardiology [facility] at [facility], who notified me that they are not going ahead with TAVR at this time as his aortic stenosis is only moderate. 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 Therapy evaluations recommending SNF. TOC on board for placement.    Assessment & Plan:   Principal  Problem:   Symptomatic bradycardia Active Problems:   Paroxysmal atrial fibrillation (HCC)   QT prolongation   COPD with acute exacerbation (HCC)   AKI (acute kidney injury) (HCC)   Knee laceration, left, initial encounter   Chronic systolic heart failure (HCC)   Severe aortic stenosis   CAD (coronary artery disease)   S/P primary angioplasty with coronary stent   Left knee laceration with profuse and persistent bleeding on admission  Bleeding has been controlled with compression bandages which have been changed again by orthopedics.  1 dose of Kcentra given on admission and bleeding stopped.  Orthopedics recommends weightbearing as tolerated on the left lower extremity and to keep the left knee extended and straight for 2 weeks and follow-up with orthopedics in 1 to 2 weeks for laceration evaluation and dressing changes.  Okay to use knee immobilizer for comfort/compliance of knee extension.  We will transition IV antibiotics to oral Keflex on discharge to complete the course,  elevate extremity and encourage ankle flexion and extension to facilitate movement. Transfuse to keep hemoglobin greater than 7. His knee pain is controlled with pain meds.    Persistent dizziness, symptomatic bradycardia Currently he is tachycardic with rates in 120/min.  Cardiology consulted, and on board, recommended to continue aspirin 81 mg and Plavix 75 mg and hold Eliquis for atleast one week. EP consulted for tachy brady syndrome.  Appreciate cardiology recommendations   Paroxysmal atrial flutter/fibrillation With tachy brady syndrome, EP consulted for further evaluation.  Anticoagulation on hold due to active bleeding from the knee laceration. .  Syncope Probably secondary to bradycardia, aortic stenosis.   Type 2 diabetes mellitus  CBG (last  3)  Recent Labs    09/20/19 2131 09/20/19 2141 09/21/19 0649  GLUCAP 128* 139* 165*    Hemoglobin A1c is 6 cbg's are well controlled. Continue with  SSI.    Chronic systolic heart failure Echocardiogram showed LVEF of 40 to 45%, with mildly decreased function. The left ventricle shows global hypokinesis. Left ventricular diastolic parameters are consistent with grade 2 diastolic dysfunction.  He appears to be compensated, diuresed about 3 lit net negative since admission.  Follow strict intake and output and daily weights.     Acute anemia of blood loss from left knee laceration Unknown baseline hemoglobin. Transfused 4 units of prbc transfusion since admission, and repeat CBC shows hemoglobin is stable around 9.    Obstructive sleep apnea on CPAP at night continue the same.    Essential hypertension Well controlled BP parameters.    COPD Tobacco abuse Counseled against tobacco use Wheezing has improved, transitioned to oral prednisone to start in am.  Weaned off oxygen.      AKI on stage III CKD Ultrasound of the kidney did not show any hydronephrosis or obstruction. Creatinine around 2 this am and stable.    DVT prophylaxis: SCDs Code Status: FULL CODE.  Family Communication: none at bedside. Discussed with son on 09/20/19 Disposition Plan:  . Patient came from: Home.             . Anticipated d/c place: SNF Barriers to d/c OR conditions which need to be met to effect a safe d/c:SNF placement, TOC consulted.   Consultants:   Orthopedics  Cardiology  PCCM.   EP  Procedures: ECHOCARDIOGRAM  Antimicrobials: cefazolin IV since admission.   Subjective: Knee pain improved with pain meds.  No sob or chest pain.  No nausea or vomiting.  Weaned off oxygen and in good spirits.   Objective: Vitals:   09/21/19 0000 09/21/19 0509 09/21/19 0734 09/21/19 0830  BP: 127/86  (!) 147/92 140/83  Pulse: (!) 103  (!) 110 (!) 112  Resp: _0 Temp: 98.5 F (36.9 C)  98.2 F (36.8 C)   TempSrc:   Oral   SpO2: 93%  96% 96%  Weight:  95.7 kg    Height:        Intake/Output Summary (Last 24 hours) at  09/21/2019 1029 Last data filed at 09/21/2019 0900 Gross per 24 hour  Intake 820 ml  Output 3950 ml  Net -3130 ml   Filed Weights   09/19/19 0558 09/20/19 0400 09/21/19 0509  Weight: 96.2 kg 96.2 kg 95.7 kg    Examination:  General exam: well Developed gentleman, on RA, not in distress.  Respiratory system: air entry fair bilaterally , scattere d wheezing heard on exam. No tachypnea,  Cardiovascular system: S1S2, IRREGULARLY irregular, no JVD, pedal edema present.  gastrointestinal system: abd is soft, non tender non distended bowel sounds wnl.  Central nervous system: alert and oriented.  Extremities: left lower extremity bandaged. Bruised on the lateral aspect.  Skin: bruising of the left lateral part of the leg.  Psychiatry: in good spirits, and mood is appropriate.     Data Reviewed: I have personally reviewed following labs and imaging studies  CBC: Recent Labs  Lab 09/16/19 1817 09/16/19 1820 09/17/19 0821 09/17/19 1554 09/18/19 0550 09/18/19 1230 09/19/19 1223 09/19/19 1814 09/20/19 0533 09/20/19 1202 09/20/19 1811 09/21/19 0117 09/21/19 0646  WBC 8.3  --  8.6  --  9.2  --  9.4  --  8.7  --   --   --   --  NEUTROABS  --   --   --   --   --   --  8.1*  --   --   --   --   --   --   HGB 9.1*   < > 7.8*   < > 7.9*   < > 9.1*   < > 9.0* 9.1* 9.4* 10.0* 9.6*  HCT 29.4*   < > 25.4*   < > 24.8*   < > 28.8*   < > 28.4* 28.9* 29.6* 31.3* 30.9*  MCV 95.5  --  97.3  --  89.5  --  90.3  --  90.2  --   --   --   --   PLT 120*  --  120*  --  102*  --  97*  --  124*  --   --   --   --    < > = values in this interval not displayed.   Basic Metabolic Panel: Recent Labs  Lab 09/16/19 1820 09/16/19 2352 09/17/19 0821 09/18/19 0550 09/19/19 1223 09/20/19 0533 09/21/19 0646  NA   < >  --  139 140 141 143 139  K   < >  --  4.6 4.7 4.2 4.7 4.3  CL   < >  --  102 103 102 104 100  CO2   < >  --  _0 GLUCOSE   < >  --  137* 174* 148* 153* 159*  BUN   < >  --   48* 55* 50* 46* 45*  CREATININE   < >  --  2.89* 2.77* 2.37* 2.33* 2.01*  CALCIUM   < >  --  8.8* 8.4* 8.6* 8.7* 8.7*  MG  --  2.2  --   --   --   --   --    < > = values in this interval not displayed.   GFR: Estimated Creatinine Clearance: 42.8 mL/min (A) (by C-G formula based on SCr of 2.01 mg/dL (H)). Liver Function Tests: Recent Labs  Lab 09/16/19 1817  AST 18  ALT 15  ALKPHOS 110  BILITOT 1.1  PROT 5.7*  ALBUMIN 2.8*   No results for input(s): LIPASE, AMYLASE in the last 168 hours. No results for input(s): AMMONIA in the last 168 hours. Coagulation Profile: Recent Labs  Lab 09/16/19 1817 09/17/19 1200  INR 2.4* 1.7*   Cardiac Enzymes: No results for input(s): CKTOTAL, CKMB, CKMBINDEX, TROPONINI in the last 168 hours. BNP (last 3 results) No results for input(s): PROBNP in the last 8760 hours. HbA1C: No results for input(s): HGBA1C in the last 72 hours. CBG: Recent Labs  Lab 09/20/19 1125 09/20/19 1645 09/20/19 2131 09/20/19 2141 09/21/19 0649  GLUCAP 127* 184* 128* 139* 165*   Lipid Profile: No results for input(s): CHOL, HDL, LDLCALC, TRIG, CHOLHDL, LDLDIRECT in the last 72 hours. Thyroid Function Tests: No results for input(s): TSH, T4TOTAL, FREET4, T3FREE, THYROIDAB in the last 72 hours. Anemia Panel: No results for input(s): VITAMINB12, FOLATE, FERRITIN, TIBC, IRON, RETICCTPCT in the last 72 hours. Sepsis Labs: Recent Labs  Lab 09/16/19 1826 09/16/19 2352  PROCALCITON  --  <0.10  LATICACIDVEN 1.4  --     Recent Results (from the past 240 hour(s))  Respiratory Panel by RT PCR (Flu A&B, Covid) - Nasopharyngeal Swab     Status: None   Collection Time: 09/16/19  8:24 PM   Specimen: Nasopharyngeal Swab  Result Value Ref  Range Status   SARS Coronavirus 2 by RT PCR NEGATIVE NEGATIVE Final    Comment: (NOTE) SARS-CoV-2 target nucleic acids are NOT DETECTED. The SARS-CoV-2 RNA is generally detectable in upper respiratoy specimens during the acute  phase of infection. The lowest concentration of SARS-CoV-2 viral copies this assay can detect is 131 copies/mL. A negative result does not preclude SARS-Cov-2 infection and should not be used as the sole basis for treatment or other patient management decisions. A negative result may occur with  improper specimen collection/handling, submission of specimen other than nasopharyngeal swab, presence of viral mutation(s) within the areas targeted by this assay, and inadequate number of viral copies (<131 copies/mL). A negative result must be combined with clinical observations, patient history, and epidemiological information. The expected result is Negative. Fact Sheet for Patients:  PinkCheek.be Fact Sheet for Healthcare Providers:  GravelBags.it This test is not yet ap proved or cleared by the [place] FDA and  has been authorized for detection and/or diagnosis of SARS-CoV-2 by FDA under an Emergency Use Authorization (EUA). This EUA will remain  in effect (meaning this test can be used) for the duration of the COVID-19 declaration under Section 564(b)(1) of the Act, 21 U.S.C. section 360bbb-3(b)(1), unless the authorization is terminated or revoked sooner.    Influenza A by PCR NEGATIVE NEGATIVE Final   Influenza B by PCR NEGATIVE NEGATIVE Final    Comment: (NOTE) The Xpert Xpress SARS-CoV-2/FLU/RSV assay is intended as an aid in  the diagnosis of influenza from Nasopharyngeal swab specimens and  should not be used as a sole basis for treatment. Nasal washings and  aspirates are unacceptable for Xpert Xpress SARS-CoV-2/FLU/RSV  testing. Fact Sheet for Patients: PinkCheek.be Fact Sheet for Healthcare Providers: GravelBags.it This test is not yet approved or cleared by the [place] FDA and  has been authorized for detection and/or diagnosis of SARS-CoV-2 by   FDA under an Emergency Use Authorization (EUA). This EUA will remain  in effect (meaning this test can be used) for the duration of the  Covid-19 declaration under Section 564(b)(1) of the Act, 21  U.S.C. section 360bbb-3(b)(1), unless the authorization is  terminated or revoked. Performed at [facility], [place] [address] [place], [place] [address]          Radiology Studies: No results found.      Scheduled Meds: . aspirin  81 mg Oral Daily  . budesonide (PULMICORT) nebulizer solution  0.25 mg Nebulization BID  . clopidogrel  75 mg Oral Daily  . dextromethorphan-guaiFENesin  1 tablet Oral BID  . DULoxetine  60 mg Oral Daily  . feeding supplement (GLUCERNA SHAKE)  237 mL Oral TID BM  . insulin aspart  0-5 Units Subcutaneous QHS  . insulin aspart  0-9 Units Subcutaneous TID WC  . ipratropium-albuterol  3 mL Nebulization BID  . lidocaine-EPINEPHrine  20 mL Intradermal Once  . methylPREDNISolone (SOLU-MEDROL) injection  60 mg Intravenous Q12H  . multivitamin with minerals  1 tablet Oral Daily  . tamsulosin  0.4 mg Oral QPC breakfast   Continuous Infusions: .  ceFAZolin (ANCEF) IV 2 g (09/21/19 0533)     LOS: 5 days       Hosie Poisson, MD [facility]   To contact the attending provider between 7A-7P or the covering provider during after hours 7P-7A, please log into the web site [email or web address] and access using universal [place] password for that web site. If you do not have the password, please call  the hospital operator.  09/21/2019, 10:29 AM

## 2019-09-21 NOTE — Progress Notes (Signed)
 -  Patient's MEWS score fired yellow (2) due to HR of 110's; pt is w/ Afib and is being treated this is not an acute change. -Vitals checked and recorded, pt. Is asymptomatic and has no complains at this time. - No further interventions at this time      Dorell Gatlin J Leno Mathes 09/21/2019,11:14 AM

## 2019-09-21 NOTE — Care Management Important Message (Signed)
Important Message  Patient Details  Name: Justin Boone MRN: [id number] Date of Birth: Jan 23, 1951   Medicare Important Message Given:  Yes     Shelda Altes 09/21/2019, 9:44 AM

## 2019-09-21 NOTE — Progress Notes (Signed)
Physical Therapy Treatment Patient Details Name: Justin Boone MRN: [id number] DOB: 1950-09-08 Today's Date: 09/21/2019    History of Present Illness Pt is a 69 y/o male admitted following fall secondary to symptomatic bradycardia. Pt sustained L knee laceration that required sutures. PMH includes COPD, a fib, CKD, CHF, CAD s/p stent placement, OSA on CPAP, and tobacco use.     PT Comments    Pt working with OT on entry, assisted with ambulation to sink for washing up. Pt is limited in safe mobility by increased fear of falling, and decreased L knee mobilization in presence of decreased strength and balance. Pt currently requires min-modAx2 for transfers and ambulation with close chair follow. Pt cued for relaxation with ambulation as HR increases into 140s bpm with increased anxiety. Pt able to calm down with breathing and ambulate with HR in 120s. D/c plans remain appropriate at this time. PT will continue to follow acutely.     Follow Up Recommendations  SNF     Equipment Recommendations  3in1 (PT)    Recommendations for Other Services       Precautions / Restrictions Precautions Precautions: Fall Precaution Comments: Must keep knee extended; no flexion  Required Braces or Orthoses: Knee Immobilizer - Left Knee Immobilizer - Left: Other (comment)(for comfort to keep knee extended) Restrictions Weight Bearing Restrictions: Yes LLE Weight Bearing: Weight bearing as tolerated    Mobility  Bed Mobility Overal bed mobility: Needs Assistance Bed Mobility: Supine to Sit     Supine to sit: Min guard     General bed mobility comments: Cues and assist for LLE to EOB  Transfers Overall transfer level: Needs assistance Equipment used: Rolling walker (2 wheeled) Transfers: Sit to/from Stand Sit to Stand: Min assist;+2 physical assistance         General transfer comment: minA +2 or modA +2 depending on fatigue and pain in LLE>  Ambulation/Gait Ambulation/Gait assistance:  Min assist;+2 physical assistance Gait Distance (Feet): 100 Feet Assistive device: Rolling walker (2 wheeled) Gait Pattern/deviations: Step-to pattern;Decreased step length - right;Decreased stance time - left;Antalgic Gait velocity: slowed Gait velocity interpretation: <1.8 ft/sec, indicate of risk for recurrent falls General Gait Details: minAx2 for steadying with ambulation, pt with increased fear of falling causing increased HR (140s) with ambulation, when pt relaxed and focused on ambulation HR in 120s         Balance Overall balance assessment: Needs assistance Sitting-balance support: No upper extremity supported;Feet supported Sitting balance-Leahy Scale: Fair     Standing balance support: Bilateral upper extremity supported;Single extremity supported;During functional activity Standing balance-Leahy Scale: Poor Standing balance comment: Single UE required for balance; pt with x3 LOB episodes at sink                            Cognition Arousal/Alertness: Awake/alert Behavior During Therapy: WFL for tasks assessed/performed Overall Cognitive Status: Within Functional Limits for tasks assessed                                           General Comments General comments (skin integrity, edema, etc.): HR 113-145 BPM with exertion. O2 >90% on RA. Pt requires encouragement for mobilizing, but also requires cues for rest breaks to avoid fatigue.      Pertinent Vitals/Pain Pain Assessment: 0-10 Pain Score: 8  Pain Location: L knee Pain Descriptors /  Indicators: Grimacing;Guarding;Aching Pain Intervention(s): Limited activity within patient's tolerance;Monitored during session;Repositioned           PT Goals (current goals can now be found in the care plan section) Acute Rehab PT Goals Patient Stated Goal: to decrease pain  PT Goal Formulation: With patient Time For Goal Achievement: 10/04/19 Potential to Achieve Goals: Good Progress  towards PT goals: Progressing toward goals    Frequency    Min 3X/week      PT Plan Current plan remains appropriate    Co-evaluation              AM-PAC PT "6 Clicks" Mobility   Outcome Measure  Help needed turning from your back to your side while in a flat bed without using bedrails?: A Little Help needed moving from lying on your back to sitting on the side of a flat bed without using bedrails?: A Little Help needed moving to and from a bed to a chair (including a wheelchair)?: A Lot Help needed standing up from a chair using your arms (e.g., wheelchair or bedside chair)?: A Lot Help needed to walk in hospital room?: A Lot Help needed climbing 3-5 steps with a railing? : Total 6 Click Score: 13    End of Session Equipment Utilized During Treatment: Gait belt Activity Tolerance: Patient limited by pain Patient left: with call bell/phone within reach;in chair Nurse Communication: Mobility status;Other (comment)(KI use for OOB) PT Visit Diagnosis: Difficulty in walking, not elsewhere classified (R26.2);Pain Pain - Right/Left: Left Pain - part of body: Knee     Time: 1152-1220 PT Time Calculation (min) (ACUTE ONLY): 28 min  Charges:  $Gait Training: 8-22 mins                     Justin Boone B. Migdalia Dk PT, DPT Acute Rehabilitation Services Pager [phone] Office [phone]    [place] 09/21/2019, 4:14 PM

## 2019-09-21 NOTE — Progress Notes (Signed)
Progress Note  Patient Name: Justin Boone Date of Encounter: 09/21/2019  Primary Cardiologist: Posey Boyer, MD   Subjective   Heart rates have continually risen, now staying 100-110s even at rest. Feeling much better after lasix dose yesterday, but still feels fatigued. Discussed EP evaluation for pacemaker for tachy-brady syndrome, he is amenable. No chest pain, breathing improved.   Inpatient Medications    Scheduled Meds: . aspirin  81 mg Oral Daily  . budesonide (PULMICORT) nebulizer solution  0.25 mg Nebulization BID  . clopidogrel  75 mg Oral Daily  . dextromethorphan-guaiFENesin  1 tablet Oral BID  . DULoxetine  60 mg Oral Daily  . feeding supplement (GLUCERNA SHAKE)  237 mL Oral TID BM  . insulin aspart  0-5 Units Subcutaneous QHS  . insulin aspart  0-9 Units Subcutaneous TID WC  . ipratropium-albuterol  3 mL Nebulization BID  . lidocaine-EPINEPHrine  20 mL Intradermal Once  . methylPREDNISolone (SOLU-MEDROL) injection  60 mg Intravenous Q12H  . multivitamin with minerals  1 tablet Oral Daily  . tamsulosin  0.4 mg Oral QPC breakfast   Continuous Infusions: .  ceFAZolin (ANCEF) IV 2 g (09/21/19 0533)   PRN Meds: acetaminophen **OR** acetaminophen, albuterol, HYDROcodone-acetaminophen, morphine injection   Vital Signs    Vitals:   09/21/19 0000 09/21/19 0509 09/21/19 0734 09/21/19 0830  BP: 127/86  (!) 147/92 140/83  Pulse: (!) 103  (!) 110 (!) 112  Resp: 16  17 14   Temp: 98.5 F (36.9 C)  98.2 F (36.8 C)   TempSrc:   Oral   SpO2: 93%  96% 96%  Weight:  95.7 kg    Height:        Intake/Output Summary (Last 24 hours) at 09/21/2019 1002 Last data filed at 09/21/2019 0900 Gross per 24 hour  Intake 820 ml  Output 3950 ml  Net -3130 ml   Last 3 Weights 09/21/2019 09/20/2019 09/19/2019  Weight (lbs) 211 lb 212 lb 212 lb  Weight (kg) 95.709 kg 96.163 kg 96.163 kg      Telemetry    Atrial fibrillation, rates 100-110 at rest, with intermittent PVCs -  Personally Reviewed  ECG    No new since 09/17/19 - Personally Reviewed  Physical Exam   GEN: Well nourished, well developed in no acute distress HEENT: Normal, moist mucous membranes NECK: No JVD at 45 degrees CARDIAC: irregularly irregular rhythm, normal S1 and S2, no rubs or gallops. 2/6 SEM murmur. VASCULAR: Radial and DP pulses 2+ bilaterally. No carotid bruits RESPIRATORY:  Coarse diffusely but slightly improved from yesterday ABDOMEN: Soft, non-tender, non-distended MUSCULOSKELETAL:  Ambulates independently SKIN: Warm and dry, trivial BL LE edema NEUROLOGIC:  Alert and oriented x 3. No focal neuro deficits noted. PSYCHIATRIC:  Normal affect   Labs    High Sensitivity Troponin:  No results for input(s): TROPONINIHS in the last 720 hours.    Chemistry Recent Labs  Lab 09/16/19 1817 09/16/19 1820 09/19/19 1223 09/20/19 0533 09/21/19 0646  NA 137   < > 141 143 139  K 4.7   < > 4.2 4.7 4.3  CL 103   < > 102 104 100  CO2 24   < > 28 29 29   GLUCOSE 134*   < > 148* 153* 159*  BUN 45*   < > 50* 46* 45*  CREATININE 2.87*   < > 2.37* 2.33* 2.01*  CALCIUM 8.9   < > 8.6* 8.7* 8.7*  PROT 5.7*  --   --   --   --  ALBUMIN 2.8*  --   --   --   --   AST 18  --   --   --   --   ALT 15  --   --   --   --   ALKPHOS 110  --   --   --   --   BILITOT 1.1  --   --   --   --   GFRNONAA 22*   < > 27* 28* 33*  GFRAA 25*   < > 32* 32* 39*  ANIONGAP 10   < > 11 10 10    < > = values in this interval not displayed.     Hematology Recent Labs  Lab 09/18/19 0550 09/18/19 1230 09/19/19 1223 09/19/19 1814 09/20/19 0533 09/20/19 1202 09/20/19 1811 09/21/19 0117 09/21/19 0646  WBC 9.2  --  9.4  --  8.7  --   --   --   --   RBC 2.77*  --  3.19*  --  3.15*  --   --   --   --   HGB 7.9*   < > 9.1*   < > 9.0*   < > 9.4* 10.0* 9.6*  HCT 24.8*   < > 28.8*   < > 28.4*   < > 29.6* 31.3* 30.9*  MCV 89.5  --  90.3  --  90.2  --   --   --   --   MCH 28.5  --  28.5  --  28.6  --   --   --    --   MCHC 31.9  --  31.6  --  31.7  --   --   --   --   RDW 22.2*  --  20.6*  --  20.7*  --   --   --   --   PLT 102*  --  97*  --  124*  --   --   --   --    < > = values in this interval not displayed.    BNPNo results for input(s): BNP, PROBNP in the last 168 hours.   DDimer No results for input(s): DDIMER in the last 168 hours.   Radiology    No results found.  Cardiac Studies   Echo 09/17/19 1. Left ventricular ejection fraction, by estimation, is 40 to 45%. The  left ventricle has mildly decreased function. The left ventricle  demonstrates global hypokinesis. The left ventricular internal cavity size  was mildly dilated. Left ventricular  diastolic parameters are consistent with Grade II diastolic dysfunction  (pseudonormalization). Elevated left atrial pressure.  2. Right ventricular systolic function is mildly reduced. The right  ventricular size is mildly enlarged. There is moderately elevated  pulmonary artery systolic pressure.  3. Left atrial size was mildly dilated.  4. Right atrial size was mildly dilated.  5. The mitral valve is normal in structure. Mild mitral valve  regurgitation.  6. The aortic valve is tricuspid. Aortic valve regurgitation is mild.  Severe aortic valve stenosis.  7. The inferior vena cava is dilated in size with <50% respiratory  variability, suggesting right atrial pressure of 15 mmHg.   Patient Profile     69 y.o. male with known severe AS, planned for TAVR at [facility] 09/22/19, paroxysmal atrial fibrillation, chronic systolic dysfunction with EF 40%, CAD with DES to RCA 08/13/19, hypertension, type II diabetes, chronic kidney disease who was admitted/for whom we are consulted for symptomatic bradycardia  and possible syncope.  Assessment & Plan    Severe aortic stenosis, presumed low flow-low gradient given EF 40%: -initially planned for [facility]R 09/22/19 at [facility]. This has been placed on hold per the patient given his current admission  Chronic  systolic heart failure: -had good response to IV lasix yesterday, negative >3L in 24 hours.  -unclear if weights accurate. Noted as 86.2 kg on admission, currently 95.7 kg (which was his weight on 4/10) -first several days of admission without well charted ins/outs, so difficult to assess cumulative volume balance -has chronic kidney disease, monitor Cr. Improved today to 2.01 with brisk response to lasix yesterday  Symptomatic bradycardia: -metoprolol and amiodarone on hold -he is high likelihood of requiring pacemaker post TAVR. -he now has atrial fibrillation with RVR with even minor movements, and his resting heart rates are 100s-110s. He also has intermittent PVCs. -options are essentially retrial very low dose of either metoprolol or amiodarone to see if he tolerates. However, given his severe event with bradycardia, the safest course of action may be to place pacemaker this admission to allow him to have rate control, which will allow him to ambulate/be more active and regain strength prior to TAVR -only concern is that he is on keflex per orthopedics for his leg wound. This appears to be preventative, as there is no documentation of actual infection -I have asked EP to consult for their management recommendations -he is amenable to pacemaker this admission if indicated. We discussed the procedure together today.  Paroxysmal atrial fibrillation: -metoprolol, amiodarone on hold given symptomatic bradycardia, as above -DOAC on hold given knee laceration that required OR and Kcentra -if no plans for pacemaker this admission, and if TAVR on hold, will need to discuss timing of DOAC re-initiation. Hgb has been stable (likely improvement today due to diuresis) -CHA2DS2/VAS Stroke Risk Points=3 (age, HF, CAD)  CAD with recent DES 08/13/19: -continue aspirin, clopidogrel given plans to hold DOAC prior to TAVR -if DOAC restarted, drop aspirin and continue clopidogrel and DOAC  For questions or  updates, please contact [place] Please consult [email or web address] for contact info under    Signed, Buford Dresser, MD  09/21/2019, 10:02 AM

## 2019-09-21 NOTE — Plan of Care (Signed)

## 2019-09-21 NOTE — Consult Note (Addendum)
Cardiology Consultation:   Patient ID: Justin Boone MRN: [id number]; DOB: September 11, 1950  Admit date: 09/16/2019 Date of Consult: 09/21/2019  Primary Care Provider: Patient, No Pcp Per Primary Cardiologist: Posey Boyer, MD  Primary Electrophysiologist:  None    Patient Profile:   Justin Boone is a 69 y.o. 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 Shawna Orleans.  History of Present Illness:   Justin Boone is being followed at [facility] for what is described as low flow severe AS mean gradient 16 mmHg and EF 40% He had LHC as part of his AS work up and had a DES to RCA  08/13/19. He was initially planned for TAVR 09/22/2019, though apparently was delayed 2/2 his current admission  The day of his admission he was sitting on his porch stood up and as he reached the door to walk inside fainted, he was able to get up under his own power, thinks he maybe was out a minute or so.  After doing an errand or two visiting his daughter, was back at home again on the porch stood felt weak in the legs and as he reached the kitchen felt palpitations and fainted, this time with injury to his knee, he also hit his head.  He suffered a large laceration to his L knee that initially required a tourniquet, KCentra and vitamin K were given, despite this had drop in Hgb requiring PRBC and eventually orthopedics repaired the laceration using multilayer fasion, was placed on antibiotics is seems prophylacticly for this. (CT head and neck were OK)  Cardiology was consulted with observation of bradycardia in the ER 36-50's.  His home metoprolol and amiodarone were held.  Syncope felt to be symptomatic bradycardia He was subsequently resumed on ASA and Plavix for his CAD/stent, Eliquis held Cardiology followed, bradycardia resolved, maintaining SR until yesterday with  intermittent RVR with his Afib.  EP is asked to weigh in for tachy-brady, pending AVR, PPM needs and timing.   The patient tells me for a couple years Dr. Otho Perl has been following his heart murmur and known to have Af.  He had felt well until about 3 mo ago, he developed fat/racing heart rate, went to a hospital in [facility] there they tried cardioversion that failed, he was transferred to [facility] and that is when he was started on metoprolol and amiodarone by his recollection.  This is also when discussion and plans for his AS/TAVR started.  Since then he has had fatigue, generally a sense of no energy, wiped out.  Not until the day of his admission did he have any dizziness or syncope.  He is OOB to chair, just finished eating, HR 110's-120's while eating and talking.  He tells me he feels better now then he has in months. He is unaware of his Afib currently Denies any SOB, feels like he has better energy.  He mentions that he would like to get a pacer so he can feel better, says that possible pacer need was mentioned by his [facility] doctors.  LABS K+ 4.3 BUN/Creat 45/1.01 Mag 2.3 H/H stabilized to 9.6/30.9 Plts 124  TSH 3.253   BP stable  Remains off eliquis  Past Medical History:  Diagnosis Date  . A-fib ([place])   . Bradycardia 09/17/2019  . CHF (congestive heart failure) ([place])   .  Diabetes mellitus type II, controlled ([place])   . HTN (hypertension)     Past Surgical History:  Procedure Laterality Date  . CHOLECYSTECTOMY    . SHOULDER SURGERY       Home Medications:  Prior to Admission medications   Medication Sig Start Date End Date Taking? Authorizing Provider  albuterol (PROVENTIL) (2.5 MG/3ML) 0.083% nebulizer solution Inhale 3 mLs into the lungs every 6 (six) hours as needed for shortness of breath or wheezing. 08/27/19  Yes [provider]  allopurinol (ZYLOPRIM) 300 MG tablet Take 300 mg by mouth daily. 08/20/19  Yes [provider]  amiodarone (PACERONE)  200 MG tablet Take 200 mg by mouth daily. 08/12/19  Yes [provider]  atorvastatin (LIPITOR) 80 MG tablet Take 80 mg by mouth daily. 08/20/19  Yes [provider]  clopidogrel (PLAVIX) 75 MG tablet Take 75 mg by mouth daily. 08/12/19  Yes [provider]  DULoxetine (CYMBALTA) 60 MG capsule Take 60 mg by mouth daily. 07/30/19  Yes [provider]  ELIQUIS 5 MG TABS tablet Take 5 mg by mouth 2 (two) times daily. 08/20/19  Yes [provider]  metoprolol succinate (TOPROL-XL) 50 MG 24 hr tablet Take 50 mg by mouth daily. 08/20/19  Yes [provider]  tamsulosin (FLOMAX) 0.4 MG CAPS capsule Take 0.4 mg by mouth daily. 07/30/19  Yes [provider]  torsemide (DEMADEX) 20 MG tablet Take 20 mg by mouth daily. 08/27/19  Yes [provider]  TRELEGY ELLIPTA 100-62.5-25 MCG/INH AEPB Inhale 1 puff into the lungs daily. 08/27/19  Yes [provider]    Inpatient Medications: Scheduled Meds: . aspirin  81 mg Oral Daily  . budesonide (PULMICORT) nebulizer solution  0.25 mg Nebulization BID  . clopidogrel  75 mg Oral Daily  . dextromethorphan-guaiFENesin  1 tablet Oral BID  . DULoxetine  60 mg Oral Daily  . feeding supplement (GLUCERNA SHAKE)  237 mL Oral TID BM  . insulin aspart  0-5 Units Subcutaneous QHS  . insulin aspart  0-9 Units Subcutaneous TID WC  . ipratropium-albuterol  3 mL Nebulization BID  . lidocaine-EPINEPHrine  20 mL Intradermal Once  . methylPREDNISolone (SOLU-MEDROL) injection  60 mg Intravenous Q12H  . multivitamin with minerals  1 tablet Oral Daily  . tamsulosin  0.4 mg Oral QPC breakfast   Continuous Infusions: .  ceFAZolin (ANCEF) IV 2 g (09/21/19 0533)   PRN Meds: acetaminophen **OR** acetaminophen, albuterol, HYDROcodone-acetaminophen, morphine injection  Allergies:    Allergies  Allergen Reactions  . Contrast Media [Iodinated Diagnostic Agents]   . Sulfa Antibiotics     Social History:    Social History   Socioeconomic History  . Marital status: Single    Spouse name: Not on file  . Number of children: Not on file  . Years of education: Not on file  . Highest education level: Not on file  Occupational History  . Not on file  Tobacco Use  . Smoking status: Current Every Day Smoker    Packs/day: 0.50    Years: 50.00    Pack years: 25.00    Types: Cigarettes  . Smokeless tobacco: Never Used  Substance and Sexual Activity  . Alcohol use: Never  . Drug use: Never  . Sexual activity: Not on file  Other Topics Concern  . Not on file  Social History Narrative  . Not on file   Social Determinants of Health   Financial Resource Strain:   . Difficulty of  Paying Living Expenses:   Food Insecurity:   . Worried About Charity fundraiser in the Last Year:   . Arboriculturist in the Last Year:   Transportation Needs:   . Film/video editor (Medical):   Marland Kitchen Lack of Transportation (Non-Medical):   Physical Activity:   . Days of Exercise per Week:   . Minutes of Exercise per Session:   Stress:   . Feeling of Stress :   Social Connections:   . Frequency of Communication with Friends and Family:   . Frequency of Social Gatherings with Friends and Family:   . Attends Religious Services:   . Active Member of Clubs or Organizations:   . Attends Archivist Meetings:   Marland Kitchen Marital Status:   Intimate Partner Violence:   . Fear of Current or Ex-Partner:   . Emotionally Abused:   Marland Kitchen Physically Abused:   . Sexually Abused:     Family History:   Family History  Problem Relation Age of Onset  . Heart disease Mother   . Heart disease Father   . Stroke Father      ROS:  Please see the history of present illness.  All other ROS reviewed and negative.     Physical Exam/Data:   Vitals:   09/21/19 0509 09/21/19 0734 09/21/19 0830 09/21/19 1112  BP:  (!) 147/92 140/83 129/90  Pulse:  (!) 110 (!) 112 (!) 116  Resp:  17 14 20   Temp:  98.2 F (36.8 C)  99 F  (37.2 C)  TempSrc:  Oral  Oral  SpO2:  96% 96% 95%  Weight: 95.7 kg     Height:        Intake/Output Summary (Last 24 hours) at 09/21/2019 1144 Last data filed at 09/21/2019 0900 Gross per 24 hour  Intake 820 ml  Output 3950 ml  Net -3130 ml   Last 3 Weights 09/21/2019 09/20/2019 09/19/2019  Weight (lbs) 211 lb 212 lb 212 lb  Weight (kg) 95.709 kg 96.163 kg 96.163 kg     Body mass index is 28.62 kg/m.  General:  Well nourished, well developed, in no acute distress, chronically ill appearing HEENT: normal Lymph: no adenopathy Neck: no JVD Endocrine:  No thryomegaly Vascular: No carotid bruits Cardiac: RRR; soft SM Lungs:  CTA b/l, no wheezing, rhonchi or rales  Abd: soft, nontender Ext: no edema Musculoskeletal:  No deformities Skin: warm and dry  Neuro:  No gross  focal abnormalities noted Psych:  Normal affect   EKG:  The EKG was personally reviewed and demonstrates:   #1, V rate 36, RBBB, LAD, some sinus beats, and junctional escape beats as well #2 SR 52, RBBB, short sinus pause 2.2 seconds #3 SR 51bpm, RBBB  Telemetry:  Telemetry was personally reviewed and demonstrates:   SB 50's-60's >> AFib 110's-120's, occ PVCs, infrequent couplet    Relevant CV Studies:  Echo 09/17/19 1. Left ventricular ejection fraction, by estimation, is 40 to 45%. The  left ventricle has mildly decreased function. The left ventricle  demonstrates global hypokinesis. The left ventricular internal cavity size  was mildly dilated. Left ventricular  diastolic parameters are consistent with Grade II diastolic dysfunction  (pseudonormalization). Elevated left atrial pressure.  2. Right ventricular systolic function is mildly reduced. The right  ventricular size is mildly enlarged. There is moderately elevated  pulmonary artery systolic pressure.  3. Left atrial size was mildly dilated.  4. Right atrial size was mildly dilated.  5.  The mitral valve is normal in structure. Mild mitral  valve  regurgitation.  6. The aortic valve is tricuspid. Aortic valve regurgitation is mild.  Severe aortic valve stenosis.  7. The inferior vena cava is dilated in size with <50% respiratory  variability, suggesting right atrial pressure of 15 mmHg.   ECHO: 06/20/2019 at [place]  1. Doppler indices suggest severe low-flow, low-gradient aortic valve stenosis.  2. There is mild aortic regurgitation.  3. The left ventricle is mildly dilated in size with normal wall thickness.  4. The left ventricular systolic function is moderately decreased, LVEF is visually estimated at 40%.  5. The mitral valve leaflets are mildly thickened with normal leaflet mobility.  6. There is moderate mitral valve regurgitation.  7. The left atrium is moderately dilated in size.  8. The right ventricle is mildly dilated in size, with mildly reduced systolic function.  9. The right atrium is moderately dilated in size.   CT cardiac TAVR: 09/09/2019 Vascular access measurements: Left common iliac artery: 10 x 7 mm Left external iliac artery: 9 x 7 cm Right common iliac artery: 10 x 7 mm Right external iliac artery: 8 x 6 mm.  1. Aortic annulus diameter is 27 mm. There is dense calcification of the aortic valve with an Agatston score of 839. 2. Three-vessel coronary artery atherosclerosis. 3. Moderate biatrial dilation. 4. Please see concurrent CTA chest abdomen pelvis for more accurate evaluation of noncardiac findings.  CATH: At [facility], 08/10/2018 Left Heart Catheterization (Left Radial) Under Lidocaine 2% local anesthesia, a 24F Terumo Glidesheath was placed in  the left radial artery using modified Seldinger technique. 5 mg of  verapamil were given via the sheath intra-arterially. A 0.035 Rosen wire  (260cm) was used to guide the advancement of the coronary catheter for  engagement. 4,000 units of heparin were given after the wire and catheter  crossed the aortic arch into  the ascending aorta. Selective coronary  angiography was then performed in multiple views by hand injections of  Omnipaque using a 24F JR4 catheter to engage the right coronary artery and  a 24F JL4 catheter to engage the left coronary artery.  The culprit lesion was identified percutaneous coronary intervention  followed as described below.  Percutaneous Coronary Intervention The decision was made to intervene on the RCA.  PCI: Percutaneous coronary intervention performed of the mRCA  Anticoagulants: Routine, ASA, Clopidogrel, Unfractionated Heparin Guide catheter: JR4  Guide Wire: 0.014" [place] Pre-Dilation Balloon: 2.5 mm  Stent:  Synergy 3.5x16 mm DES Post-Dilation Balloon: N/a Result:  Excellent angiographic result with TIMI 3 flow  Following the procedure, the sheath was removed, and a TR band was applied  to achieve hemostasis. The TR band was subsequently removed with staged  removal of air as per protocol. The patient tolerated the procedure well  without any complications.  IVUS An [place] IVUS catheter was advanced down the wire and used to  visualize the leftand right CIA,EIA, and CFA. There was mild plaquing  throughout the inflow vessel. No significant stenoses noted with CFA  around 7.5-8.28mm  FINDINGS  Hemodynamics and Left Heart Catheterization  Aortic pressure: 105/70 mm Hg (mean 86 mm Hg)  Coronary Angiography  Dominance: Right  Left Main: The left main coronary artery (LMCA) is a large-caliber vessel  that originates from the left coronary sinus. It bifurcates into the left  anterior descending (LAD) and left circumflex (LCx) arteries. There is no  angiographic evidence of significant disease in  the LMCA.  LAD: The LAD is a large-caliber vessel that gives off 2 diagonal (D)  branches before it wraps around the apex. High D1 is a large-caliber,  branching vessel. D2 is a moderate-caliber vessel with a 90% stenosis in  the proximal segment.  There is no other angiographic evidence of  significant disease in the LAD.  Left Circumflex: The LCx is a large-caliber vessel that gives off 3 obtuse  marginal (OM) branches and then continues as a small vessel in the AV  groove. OM1 is a large-caliber vessel. OM2 is a moderate-caliber vessel.  OM3 is a large-caliber vessel. There is no angiographic evidence of  significant disease in the LCx.  Right Coronary: The right coronary artery (RCA) is a large-caliber vessel  originating from the right coronary sinus. It bifurcates distally into a  large-caliber posterior descending artery (PDA) and small posterolateral  (PL) branches consistent with a right dominant system. There is a 90%  stenosis of the mRCA.     Laboratory Data:  High Sensitivity Troponin:  No results for input(s): TROPONINIHS in the last 720 hours.   Chemistry Recent Labs  Lab 09/19/19 1223 09/20/19 0533 09/21/19 0646  NA 141 143 139  K 4.2 4.7 4.3  CL 102 104 100  CO2 28 29 29   GLUCOSE 148* 153* 159*  BUN 50* 46* 45*  CREATININE 2.37* 2.33* 2.01*  CALCIUM 8.6* 8.7* 8.7*  GFRNONAA 27* 28* 33*  GFRAA 32* 32* 39*  ANIONGAP 11 10 10     Recent Labs  Lab 09/16/19 1817  PROT 5.7*  ALBUMIN 2.8*  AST 18  ALT 15  ALKPHOS 110  BILITOT 1.1   Hematology Recent Labs  Lab 09/18/19 0550 09/18/19 1230 09/19/19 1223 09/19/19 1814 09/20/19 0533 09/20/19 1202 09/20/19 1811 09/21/19 0117 09/21/19 0646  WBC 9.2  --  9.4  --  8.7  --   --   --   --   RBC 2.77*  --  3.19*  --  3.15*  --   --   --   --   HGB 7.9*   < > 9.1*   < > 9.0*   < > 9.4* 10.0* 9.6*  HCT 24.8*   < > 28.8*   < > 28.4*   < > 29.6* 31.3* 30.9*  MCV 89.5  --  90.3  --  90.2  --   --   --   --   MCH 28.5  --  28.5  --  28.6  --   --   --   --   MCHC 31.9  --  31.6  --  31.7  --   --   --   --   RDW 22.2*  --  20.6*  --  20.7*  --   --   --   --   PLT 102*  --  97*  --  124*  --   --   --   --    < > = values in this interval not  displayed.   BNPNo results for input(s): BNP, PROBNP in the last 168 hours.  DDimer No results for input(s): DDIMER in the last 168 hours.   Radiology/Studies:   DG Chest Port 1 View Result Date: 09/18/2019 CLINICAL DATA:  Pleural effusion follow-up. EXAM: PORTABLE CHEST 1 VIEW COMPARISON:  September 16, 2019 FINDINGS: Cardiomegaly is stable. The hila and mediastinum are normal. Small bilateral effusions with underlying atelectasis. Increased interstitial markings consistent with mild edema,  increased in the interval. No other acute abnormalities. IMPRESSION: Cardiomegaly, small stable pleural effusions, mild increased pulmonary edema. Electronically Signed   By: Dorise Bullion III M.D   On: 09/18/2019 12:23     Assessment and Plan:   1. Tachy-brady 2. Paroxysmal AFib and flutter mentioned as well     CHA2DS2Vasc is 4, on eliquis out patient  He has baseline RBBB (unable to see EKGs, though is described on his [facility] EKGs as well) Makes him increase likely hood with his TAVR to need pacing post TAVR  He has pacing indication He has a very large L knee laceration (being covered by antibiotocs).    Last ortho recs were to see them in 2 weeks for wound evaluation and dressing change.   Dr. Rayann Heman will see later today     For questions or updates, please contact [facility] Please consult [email or web address] for contact info under     Signed, Baldwin Jamaica, PA-C  09/21/2019 11:44 AM   I have seen, examined the patient, and reviewed the above assessment and plan.  Changes to above are made where necessary.  On exam, iRRR.  The patient presents with syncope and symptomatic bradycardia.  He also has afib with RVR.  I would therefore recommend PPM implantation. Risks, benefits, alternatives to pacemaker implantation were discussed in detail with the patient today. Will tentatively place on the schedule for PPM with Dr Curt Bears tomorrow.   Co Sign: Thompson Grayer, MD 09/21/2019 7:56  PM

## 2019-09-22 ENCOUNTER — Inpatient Hospital Stay (HOSPITAL_COMMUNITY)
Admission: EM | Disposition: A | Discharge status: Home Health | Payer: Self-pay | Source: Home / Self Care | Attending: Internal Medicine

## 2019-09-22 DIAGNOSIS — I495 Sick sinus syndrome: Secondary | ICD-10-CM

## 2019-09-22 DIAGNOSIS — I509 Heart failure, unspecified: Secondary | ICD-10-CM

## 2019-09-22 HISTORY — PX: BIV PACEMAKER INSERTION CRT-P: EP1199

## 2019-09-22 LAB — CBC
HCT: 30.5 % — ABNORMAL LOW (ref 39.0–52.0)
Hemoglobin: 9.5 g/dL — ABNORMAL LOW (ref 13.0–17.0)
MCH: 29 pg (ref 26.0–34.0)
MCHC: 31.1 g/dL (ref 30.0–36.0)
MCV: 93 fL (ref 80.0–100.0)
Platelets: 131 10*3/uL — ABNORMAL LOW (ref 150–400)
RBC: 3.28 MIL/uL — ABNORMAL LOW (ref 4.22–5.81)
RDW: 21.2 % — ABNORMAL HIGH (ref 11.5–15.5)
WBC: 9.6 10*3/uL (ref 4.0–10.5)
nRBC: 0.5 % — ABNORMAL HIGH (ref 0.0–0.2)

## 2019-09-22 LAB — SURGICAL PCR SCREEN
MRSA, PCR: NEGATIVE
Staphylococcus aureus: NEGATIVE

## 2019-09-22 LAB — BASIC METABOLIC PANEL
Anion gap: 9 (ref 5–15)
BUN: 47 mg/dL — ABNORMAL HIGH (ref 8–23)
CO2: 30 mmol/L (ref 22–32)
Calcium: 8.9 mg/dL (ref 8.9–10.3)
Chloride: 104 mmol/L (ref 98–111)
Creatinine, Ser: 1.96 mg/dL — ABNORMAL HIGH (ref 0.61–1.24)
GFR calc Af Amer: 40 mL/min — ABNORMAL LOW (ref >60)
GFR calc non Af Amer: 34 mL/min — ABNORMAL LOW (ref >60)
Glucose, Bld: 151 mg/dL — ABNORMAL HIGH (ref 70–99)
Potassium: 4.7 mmol/L (ref 3.5–5.1)
Sodium: 143 mmol/L (ref 135–145)

## 2019-09-22 LAB — GLUCOSE, CAPILLARY
Glucose-Capillary: 134 mg/dL — ABNORMAL HIGH (ref 70–99)
Glucose-Capillary: 151 mg/dL — ABNORMAL HIGH (ref 70–99)
Glucose-Capillary: 173 mg/dL — ABNORMAL HIGH (ref 70–99)

## 2019-09-22 SURGERY — BIV PACEMAKER INSERTION CRT-P
Anesthesia: LOCAL

## 2019-09-22 MED ORDER — SODIUM CHLORIDE 0.9 % IV SOLN
INTRAVENOUS | Status: AC
  Filled 2019-09-22: qty 2

## 2019-09-22 MED ORDER — AMIODARONE HCL IN DEXTROSE 360-4.14 MG/200ML-% IV SOLN
60.0000 mg/h | INTRAVENOUS | Status: AC
  Administered 2019-09-22: 60 mg/h via INTRAVENOUS
  Filled 2019-09-22: qty 200

## 2019-09-22 MED ORDER — AMIODARONE HCL IN DEXTROSE 360-4.14 MG/200ML-% IV SOLN
INTRAVENOUS | Status: AC | PRN
  Administered 2019-09-22: 60 mg/h via INTRAVENOUS

## 2019-09-22 MED ORDER — HEPARIN (PORCINE) IN NACL 1000-0.9 UT/500ML-% IV SOLN
INTRAVENOUS | Status: DC | PRN
  Administered 2019-09-22: 500 mL

## 2019-09-22 MED ORDER — METHYLPREDNISOLONE SODIUM SUCC 125 MG IJ SOLR
INTRAMUSCULAR | Status: DC | PRN
  Administered 2019-09-22: 60 mg via INTRAVENOUS

## 2019-09-22 MED ORDER — CEFAZOLIN SODIUM-DEXTROSE 2-4 GM/100ML-% IV SOLN
2.0000 g | Freq: Three times a day (TID) | INTRAVENOUS | Status: AC
  Administered 2019-09-22 – 2019-09-23 (×2): 2 g via INTRAVENOUS
  Filled 2019-09-22 (×3): qty 100

## 2019-09-22 MED ORDER — MIDAZOLAM HCL 5 MG/5ML IJ SOLN
INTRAMUSCULAR | Status: AC
  Filled 2019-09-22: qty 5

## 2019-09-22 MED ORDER — AMIODARONE HCL IN DEXTROSE 360-4.14 MG/200ML-% IV SOLN: 30.0000 mg/h | INTRAVENOUS | Status: DC

## 2019-09-22 MED ORDER — AMIODARONE HCL IN DEXTROSE 360-4.14 MG/200ML-% IV SOLN
INTRAVENOUS | Status: AC
  Filled 2019-09-22: qty 200

## 2019-09-22 MED ORDER — HEPARIN (PORCINE) IN NACL 1000-0.9 UT/500ML-% IV SOLN
INTRAVENOUS | Status: AC
  Filled 2019-09-22: qty 500

## 2019-09-22 MED ORDER — IOHEXOL 350 MG/ML SOLN
INTRAVENOUS | Status: DC | PRN
  Administered 2019-09-22: 10 mL via INTRAVENOUS

## 2019-09-22 MED ORDER — LIDOCAINE HCL (PF) 1 % IJ SOLN
INTRAMUSCULAR | Status: DC | PRN
  Administered 2019-09-22: 50 mL

## 2019-09-22 MED ORDER — LIDOCAINE HCL 1 % IJ SOLN
INTRAMUSCULAR | Status: AC
  Filled 2019-09-22: qty 60

## 2019-09-22 MED ORDER — FUROSEMIDE 10 MG/ML IJ SOLN
40.0000 mg | Freq: Once | INTRAMUSCULAR | Status: AC
  Administered 2019-09-22: 40 mg via INTRAVENOUS
  Filled 2019-09-22: qty 4

## 2019-09-22 MED ORDER — CEFAZOLIN SODIUM-DEXTROSE 2-4 GM/100ML-% IV SOLN
INTRAVENOUS | Status: AC
  Filled 2019-09-22: qty 100

## 2019-09-22 MED ORDER — METHYLPREDNISOLONE SODIUM SUCC 125 MG IJ SOLR
INTRAMUSCULAR | Status: AC
  Filled 2019-09-22: qty 2

## 2019-09-22 MED ORDER — METOPROLOL TARTRATE 25 MG PO TABS
25.0000 mg | ORAL_TABLET | Freq: Two times a day (BID) | ORAL | Status: DC
  Administered 2019-09-22: 21:00:00 25 mg via ORAL
  Filled 2019-09-22: qty 1

## 2019-09-22 MED ORDER — MIDAZOLAM HCL 5 MG/5ML IJ SOLN
INTRAMUSCULAR | Status: DC | PRN
  Administered 2019-09-22: 1 mg via INTRAVENOUS

## 2019-09-22 MED ORDER — METOPROLOL TARTRATE 25 MG PO TABS
25.0000 mg | ORAL_TABLET | Freq: Once | ORAL | Status: AC
  Administered 2019-09-22: 25 mg via ORAL
  Filled 2019-09-22: qty 1

## 2019-09-22 MED ORDER — DIPHENHYDRAMINE HCL 50 MG/ML IJ SOLN
25.0000 mg | Freq: Once | INTRAMUSCULAR | Status: AC
  Administered 2019-09-22: 25 mg via INTRAVENOUS
  Filled 2019-09-22: qty 1

## 2019-09-22 MED ORDER — AMIODARONE LOAD VIA INFUSION
INTRAVENOUS | Status: DC | PRN
  Administered 2019-09-22: 150 mg via INTRAVENOUS

## 2019-09-22 SURGICAL SUPPLY — 20 items
ADAPTER SEALING SSA-EW-09 (MISCELLANEOUS) ×2 IMPLANT
BALLN ATTAIN 80 (BALLOONS) ×2
BALLOON ATTAIN 80 (BALLOONS) ×1 IMPLANT
CABLE SURGICAL S-101-97-12 (CABLE) ×2 IMPLANT
CATH ATTAIN COM SURV 6250V-MB2 (CATHETERS) ×2 IMPLANT
CATH ATTAIN SEL SURV 6248V-130 (CATHETERS) ×2 IMPLANT
CATH CPS DIRECT 135 DS2C020 (CATHETERS) ×4 IMPLANT
DEVICE CRTP PERCEPTA QUAD MRI (Pacemaker) ×2 IMPLANT
HEMOSTAT SURGICEL 2X4 FIBR (HEMOSTASIS) ×4 IMPLANT
KIT MICROPUNCTURE NIT STIFF (SHEATH) ×2 IMPLANT
LEAD CAPSURE NOVUS 5076-52CM (Lead) ×2 IMPLANT
LEAD CAPSURE NOVUS 5076-58CM (Lead) ×2 IMPLANT
LEAD QUARTET 1458Q-86CM (Lead) ×2 IMPLANT
PAD PRO RADIOLUCENT 2001M-C (PAD) ×2 IMPLANT
SHEATH 7FR PRELUDE SNAP 13 (SHEATH) ×4 IMPLANT
SHEATH 9.5FR PRELUDE SNAP 13 (SHEATH) ×4 IMPLANT
SLITTER UNIVERSAL DS2A003 (MISCELLANEOUS) ×6 IMPLANT
TRAY PACEMAKER INSERTION (PACKS) ×2 IMPLANT
WIRE ACUITY WHISPER EDS 4648 (WIRE) ×8 IMPLANT
WIRE HI TORQ VERSACORE-J 145CM (WIRE) ×4 IMPLANT

## 2019-09-22 NOTE — Progress Notes (Signed)
ctsp for chest pain, left sided,  ECG minor st seg depression V1 and 2; present but to a lesser degree 4/0 and notably HR is now 123 vs about 50 Prior stent was LAD so clearly no evidence of stent thrombosis Chest pain Relieved spontaneously Will proceed with CRT-P    Tachycardia;  Unfortunately anticoagulation has been held and flutter > 48 hrs precluding DCCV  Will plan resumption of amiodarone post implant for rate control

## 2019-09-22 NOTE — Progress Notes (Signed)
CPT still weak from surgery.

## 2019-09-22 NOTE — Progress Notes (Signed)
HR 150-160s Harrell Gave, MD made aware BP 127/91. Pt asymptomatic. Orders received.

## 2019-09-22 NOTE — Progress Notes (Signed)
Ok to stop cefazolin after 7d of therapy per Dr. Erlinda Hong.   Onnie Boer, PharmD, BCIDP, AAHIVP, CPP Infectious Disease Pharmacist 09/22/2019 9:43 AM

## 2019-09-22 NOTE — TOC Progression Note (Signed)
Transition of Care [facility]) - Progression Note    Patient Details  Name: Justin Boone MRN: [id number] Date of Birth: Dec 31, 1950  Transition of Care [facility]) CM/SW [place], [place] Phone Number: 09/22/2019, 4:53 PM  Clinical Narrative:     CSW continues to look for home health agency near [place] where patient will be living with his son in [place], [place] has reached out to [facility] and [facility] who have declined. [facility] is currently viewing and will let CSW know if that branch location takes patient's insurance.   Expected Discharge Plan: [place] Barriers to Discharge: Continued Medical Work up  Expected Discharge Plan and Services Expected Discharge Plan: [place] arrangements for the past 2 months: Single Family Home                                       Social Determinants of Health (SDOH) Interventions    Readmission Risk Interventions No flowsheet data found.

## 2019-09-22 NOTE — Progress Notes (Signed)
Progress Note  Patient Name: Justin Boone Date of Encounter: 09/22/2019  Primary Cardiologist: Posey Boyer, MD   Subjective   Planned for pacemaker today, which will allow [place] to manage his heart rate without risk of bradycardia. He is glad to have a plan to move forward. Feels a little more swollen today, breathing stable, no chest pain.  Inpatient Medications    Scheduled Meds: . aspirin  81 mg Oral Daily  . budesonide (PULMICORT) nebulizer solution  0.25 mg Nebulization BID  . clopidogrel  75 mg Oral Daily  . dextromethorphan-guaiFENesin  1 tablet Oral BID  . diphenhydrAMINE  25 mg Intravenous Once  . DULoxetine  60 mg Oral Daily  . feeding supplement (GLUCERNA SHAKE)  237 mL Oral TID BM  . furosemide  40 mg Intravenous Once  . gentamicin irrigation  80 mg Irrigation On Call  . insulin aspart  0-5 Units Subcutaneous QHS  . insulin aspart  0-9 Units Subcutaneous TID WC  . ipratropium-albuterol  3 mL Nebulization BID  . lidocaine-EPINEPHrine  20 mL Intradermal Once  . multivitamin with minerals  1 tablet Oral Daily  . predniSONE  40 mg Oral QAC breakfast  . tamsulosin  0.4 mg Oral QPC breakfast   Continuous Infusions: . sodium chloride    . sodium chloride 10 mL/hr at 09/22/19 0819  .  ceFAZolin (ANCEF) IV 2 g (09/22/19 0542)  .  ceFAZolin (ANCEF) IV     PRN Meds: acetaminophen **OR** acetaminophen, albuterol, HYDROcodone-acetaminophen, morphine injection   Vital Signs    Vitals:   09/22/19 0400 09/22/19 0500 09/22/19 0802 09/22/19 0811  BP: (!) 130/92  131/87   Pulse: (!) 117 (!) 117 (!) 111   Resp: 16 17 20    Temp:   98.1 F (36.7 C)   TempSrc:   Oral   SpO2: 97% 99% 96% 97%  Weight: 97.1 kg     Height:        Intake/Output Summary (Last 24 hours) at 09/22/2019 1044 Last data filed at 09/22/2019 0900 Gross per 24 hour  Intake 580 ml  Output 1150 ml  Net -570 ml   Last 3 Weights 09/22/2019 09/21/2019 09/20/2019  Weight (lbs) 214 lb 211 lb 212 lb   Weight (kg) 97.07 kg 95.709 kg 96.163 kg      Telemetry    Atrial fibrillation, rates 110s-120s, with frequent PVCs - Personally Reviewed  ECG    No new since 09/17/19 - Personally Reviewed  Physical Exam   GEN: Well nourished, well developed in no acute distress HEENT: Normal, moist mucous membranes NECK: JVD at clavicle at 45 degrees CARDIAC: irregularly irregular rhythm, normal S1 and S2, no rubs or gallops. 2/6 SE murmur. VASCULAR: Radial pulses 2+ bilaterally.  RESPIRATORY:  Diffusely coarse ABDOMEN: Soft, non-tender, non-distended MUSCULOSKELETAL:  LLE dressed/bandaged SKIN: Warm and dry, trivial bilateral LE edema NEUROLOGIC:  Alert and oriented x 3. No focal neuro deficits noted. PSYCHIATRIC:  Normal affect   Labs    High Sensitivity Troponin:  No results for input(s): TROPONINIHS in the last 720 hours.    Chemistry Recent Labs  Lab 09/16/19 1817 09/16/19 1820 09/20/19 0533 09/21/19 0646 09/22/19 0502  NA 137   < > 143 139 143  K 4.7   < > 4.7 4.3 4.7  CL 103   < > 104 100 104  CO2 24   < > 29 29 30   GLUCOSE 134*   < > 153* 159* 151*  BUN 45*   < >  46* 45* 47*  CREATININE 2.87*   < > 2.33* 2.01* 1.96*  CALCIUM 8.9   < > 8.7* 8.7* 8.9  PROT 5.7*  --   --   --   --   ALBUMIN 2.8*  --   --   --   --   AST 18  --   --   --   --   ALT 15  --   --   --   --   ALKPHOS 110  --   --   --   --   BILITOT 1.1  --   --   --   --   GFRNONAA 22*   < > 28* 33* 34*  GFRAA 25*   < > 32* 39* 40*  ANIONGAP 10   < > 10 10 9    < > = values in this interval not displayed.     Hematology Recent Labs  Lab 09/19/19 1223 09/19/19 1814 09/20/19 0533 09/20/19 1202 09/21/19 0117 09/21/19 0646 09/22/19 0502  WBC 9.4  --  8.7  --   --   --  9.6  RBC 3.19*  --  3.15*  --   --   --  3.28*  HGB 9.1*   < > 9.0*   < > 10.0* 9.6* 9.5*  HCT 28.8*   < > 28.4*   < > 31.3* 30.9* 30.5*  MCV 90.3  --  90.2  --   --   --  93.0  MCH 28.5  --  28.6  --   --   --  29.0  MCHC 31.6  --   31.7  --   --   --  31.1  RDW 20.6*  --  20.7*  --   --   --  21.2*  PLT 97*  --  124*  --   --   --  131*   < > = values in this interval not displayed.    BNPNo results for input(s): BNP, PROBNP in the last 168 hours.   DDimer No results for input(s): DDIMER in the last 168 hours.   Radiology    No results found.  Cardiac Studies   Echo 09/17/19 1. Left ventricular ejection fraction, by estimation, is 40 to 45%. The  left ventricle has mildly decreased function. The left ventricle  demonstrates global hypokinesis. The left ventricular internal cavity size  was mildly dilated. Left ventricular  diastolic parameters are consistent with Grade II diastolic dysfunction  (pseudonormalization). Elevated left atrial pressure.  2. Right ventricular systolic function is mildly reduced. The right  ventricular size is mildly enlarged. There is moderately elevated  pulmonary artery systolic pressure.  3. Left atrial size was mildly dilated.  4. Right atrial size was mildly dilated.  5. The mitral valve is normal in structure. Mild mitral valve  regurgitation.  6. The aortic valve is tricuspid. Aortic valve regurgitation is mild.  Severe aortic valve stenosis.  7. The inferior vena cava is dilated in size with <50% respiratory  variability, suggesting right atrial pressure of 15 mmHg.   Patient Profile     69 y.o. male with known severe AS, planned for TAVR at [facility] 09/22/19, paroxysmal atrial fibrillation, chronic systolic dysfunction with EF 40%, CAD with DES to RCA 08/13/19, hypertension, type II diabetes, chronic kidney disease who was admitted/for whom we are consulted for symptomatic bradycardia and possible syncope.  Assessment & Plan    Severe aortic stenosis, presumed low flow-low gradient  given EF 40%: -initially planned for TAVR 09/22/19 at [facility]. This has been placed on hold per the patient given his current admission  Chronic systolic heart failure: -weight up slightly  today, with slight JVD elevation. Will give IV lasix today prior to pacemaker procedure. -unclear if weights accurate. Noted as 86.2 kg on admission, currently 97.1 -first several days of admission without well charted ins/outs, so difficult to assess cumulative volume balance -has chronic kidney disease, monitor Cr. Improved today to 1.96  Symptomatic bradycardia: -metoprolol and amiodarone on hold -planned for pacemaker today due to tachy-brady syndrome -this will allow [place] to restart beta blocker and/or amiodarone for rate and PVC control.  Paroxysmal atrial fibrillation: -metoprolol, amiodarone on hold given symptomatic bradycardia, as above -DOAC on hold given knee laceration that required OR and Kcentra, restart once ok from pacemaker implantation -CHA2DS2/VAS Stroke Risk Points=3 (age, HF, CAD)  CAD with recent DES 08/13/19: -when DOAC restarted, drop aspirin and continue clopidogrel and DOAC  For questions or updates, please contact [place] Please consult [email or web address] for contact info under    Signed, Buford Dresser, MD  09/22/2019, 10:44 AM

## 2019-09-22 NOTE — Progress Notes (Signed)
Patient refuses to wear a CPAP in the hospital.

## 2019-09-22 NOTE — Progress Notes (Signed)
Patient c/o chest pain, pain last for couple of minutes and stop per patient, MD notified. 12 lead EKG done, no new order given. Will continue to monitor the patient.

## 2019-09-22 NOTE — Progress Notes (Signed)
PROGRESS NOTE  Sahib Pella OXB:[id number] DOB: [birth year] DOA: 09/16/2019 PCP: Patient, No Pcp Per  Brief Narrative:  69 year old gentleman prior history of atrial fibrillation on Eliquis, diabetes mellitus, hypertension, COPD, obstructive sleep apnea on CPAP, ongoing tobacco use stage IIIb CKD, atrial fibrillation/flutter s/p cardioversion on amiodarone and Eliquis, chronic systolic heart failure, recent left heart catheterization in the setting of TAVR work-up on 08/12/2019, s/p drug-eluting stent to the LAD on Plavix and Eliquis presents to ED after a fall and left knee laceration.  X-rays of the left knee shows mild to moderate amount of infrapatellar soft tissue swelling and soft tissue air without evidence of acute abnormality.  Due to persistent bleeding orthopedics consulted and suggested to continue with compression wrapping and no indication of urgent washout of the knee in the OR.  Patient started bleeding profusely and persistently from the laceration soaking his dressing, bandages in the bed.  Patient's hemoglobin has dropped from 9.9-7.8 the last 24 hours from profuse bleeding from the left knee laceration.  Discussed with pharmacy regarding Andexxa suggested that this is not a life-threatening bleeding and his last Eliquis dose was on the morning of 09/16/2019, he is out of the window and has been 24 hours since her last Eliquis dose.  He was given  K Centra and vitamin K respiratory anticoagulation antidote protocol. His bleeding has stopped since then and Orthopedics recommended follow-up for left knee laceration evaluation and dressing change in 1 to 2 weeks. Called his cardiology office at [facility], who notified me that they are not going ahead with TAVR at this time as his aortic stenosis is only moderate. 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 Therapy evaluations recommending SNF. TOC on board for placement.     HPI/Recap of past 24  hours:  Report feeling hot, denies chest pain He is npo awaiting for pacemaker placement  Assessment/Plan: Principal Problem:   Symptomatic bradycardia Active Problems:   Paroxysmal atrial fibrillation (HCC)   QT prolongation   COPD with acute exacerbation (HCC)   AKI (acute kidney injury) ([place])   Knee laceration, left, initial encounter   Chronic systolic heart failure (HCC)   Severe aortic stenosis   CAD (coronary artery disease)   S/P primary angioplasty with coronary stent   Left knee laceration with profuse and persistent bleeding/acute blood loss anemia on admission  After syncope episode  Bleeding has been controlled with compression bandages which have been changed again by orthopedics.  1 dose of Kcentra given on admission and bleeding stopped.  Orthopedics recommends weightbearing as tolerated on the left lower extremity and to keep the left knee extended and straight for 2 weeks and follow-up with orthopedics in 1 to 2 weeks for laceration evaluation and dressing changes.  Okay to use knee immobilizer for comfort/compliance of knee extension.  We will transition IV antibiotics to oral Keflex on discharge to complete the course,  elevate extremity and encourage ankle flexion and extension to facilitate movement.  paf with tachy/brady syndrome/syncope Cardiology/ep input appreciated, plan for pacemaker placement today eliquis held since admission, plan to resume on 4/16 if ok with cardiology/EP   Chronic systolic heart failure Echocardiogram showed LVEF of 40 to 45%, with mildly decreased function. The left ventricle shows global hypokinesis. Left ventricular diastolic parameters are consistent with grade 2 diastolic dysfunction.  He appears to be compensated, diuresed about 3 lit net negative since admission.  Follow strict intake and output and daily weights   AKI  on stage III CKD Cr 3 on presentaion,  Ultrasound of the kidney did not show any hydronephrosis or  obstruction. Creatinine around 2 this am and stable.   Diet controlled dm2 a1c 6  COPD Tobacco abuse Counseled against tobacco use Wheezing has improved, transitioned to oral prednisone to start in am.  Weaned off oxygen.   Obstructive sleep apnea on CPAP at night continue the same.  FTT: PT recommend snf placement  DVT Prophylaxis: resume eliquis when ok with cardiology  Code Status: full  Family Communication: patient   Disposition Plan:    Patient came from:         home                                                                                                 Anticipated d/c place:  SNF  Barriers to d/c OR conditions which need to be met to effect a safe d/c: needs pacemaker placement, needs EP clearance, needs hgb to be stable, hopefully on 4/16   Consultants:  Cardiology/EP  Ortho  Critical care  Wound care  Procedures:  prbc transfusion  Pacemaker placement  Antibiotics:  rocephin   Objective: BP (!) 130/92   Pulse (!) 117   Temp 98.1 F (36.7 C) (Oral)   Resp 17   Ht 6' (1.829 m)   Wt 97.1 kg   SpO2 99%   BMI 29.02 kg/m   Intake/Output Summary (Last 24 hours) at 09/22/2019 0658 Last data filed at 09/22/2019 0500 Gross per 24 hour  Intake 940 ml  Output 1150 ml  Net -210 ml   Filed Weights   09/20/19 0400 09/21/19 0509 09/22/19 0400  Weight: 96.2 kg 95.7 kg 97.1 kg    Exam: Patient is examined daily including today on 09/22/2019, exams remain the same as of yesterday except that has changed    General:  NAD  Cardiovascular: IRRR,  tachycardia  Respiratory: CTABL  Abdomen: Soft/ND/NT, positive BS  Musculoskeletal: left knee in immobolizer  Neuro: alert, oriented   Data Reviewed: Basic Metabolic Panel: Recent Labs  Lab 09/16/19 2352 09/17/19 0821 09/18/19 0550 09/19/19 1223 09/20/19 0533 09/21/19 0646 09/22/19 0502  NA  --    < > 140 141 143 139 143  K  --    < > 4.7 4.2 4.7 4.3 4.7  CL  --    < > 103 102  104 100 104  CO2  --    < > 28 28 29 29 30   GLUCOSE  --    < > 174* 148* 153* 159* 151*  BUN  --    < > 55* 50* 46* 45* 47*  CREATININE  --    < > 2.77* 2.37* 2.33* 2.01* 1.96*  CALCIUM  --    < > 8.4* 8.6* 8.7* 8.7* 8.9  MG 2.2  --   --   --   --  2.3  --    < > = values in this interval not displayed.   Liver Function Tests: Recent Labs  Lab 09/16/19 1817  AST 18  ALT 15  ALKPHOS 110  BILITOT 1.1  PROT 5.7*  ALBUMIN 2.8*   No results for input(s): LIPASE, AMYLASE in the last 168 hours. No results for input(s): AMMONIA in the last 168 hours. CBC: Recent Labs  Lab 09/17/19 0821 09/17/19 1554 09/18/19 0550 09/18/19 1230 09/19/19 1223 09/19/19 1814 09/20/19 0533 09/20/19 0533 09/20/19 1202 09/20/19 1811 09/21/19 0117 09/21/19 0646 09/22/19 0502  WBC 8.6  --  9.2  --  9.4  --  8.7  --   --   --   --   --  9.6  NEUTROABS  --   --   --   --  8.1*  --   --   --   --   --   --   --   --   HGB 7.8*   < > 7.9*   < > 9.1*   < > 9.0*   < > 9.1* 9.4* 10.0* 9.6* 9.5*  HCT 25.4*   < > 24.8*   < > 28.8*   < > 28.4*   < > 28.9* 29.6* 31.3* 30.9* 30.5*  MCV 97.3  --  89.5  --  90.3  --  90.2  --   --   --   --   --  93.0  PLT 120*  --  102*  --  97*  --  124*  --   --   --   --   --  131*   < > = values in this interval not displayed.   Cardiac Enzymes:   No results for input(s): CKTOTAL, CKMB, CKMBINDEX, TROPONINI in the last 168 hours. BNP (last 3 results) No results for input(s): BNP in the last 8760 hours.  ProBNP (last 3 results) No results for input(s): PROBNP in the last 8760 hours.  CBG: Recent Labs  Lab 09/21/19 0649 09/21/19 1112 09/21/19 1640 09/21/19 2115 09/22/19 0610  GLUCAP 165* 129* 156* 193* 134*    Recent Results (from the past 240 hour(s))  Respiratory Panel by RT PCR (Flu A&B, Covid) - Nasopharyngeal Swab     Status: None   Collection Time: 09/16/19  8:24 PM   Specimen: Nasopharyngeal Swab  Result Value Ref Range Status   SARS Coronavirus 2 by  RT PCR NEGATIVE NEGATIVE Final    Comment: (NOTE) SARS-CoV-2 target nucleic acids are NOT DETECTED. The SARS-CoV-2 RNA is generally detectable in upper respiratoy specimens during the acute phase of infection. The lowest concentration of SARS-CoV-2 viral copies this assay can detect is 131 copies/mL. A negative result does not preclude SARS-Cov-2 infection and should not be used as the sole basis for treatment or other patient management decisions. A negative result may occur with  improper specimen collection/handling, submission of specimen other than nasopharyngeal swab, presence of viral mutation(s) within the areas targeted by this assay, and inadequate number of viral copies (<131 copies/mL). A negative result must be combined with clinical observations, patient history, and epidemiological information. The expected result is Negative. Fact Sheet for Patients:  PinkCheek.be Fact Sheet for Healthcare Providers:  GravelBags.it This test is not yet ap proved or cleared by the [place] FDA and  has been authorized for detection and/or diagnosis of SARS-CoV-2 by FDA under an Emergency Use Authorization (EUA). This EUA will remain  in effect (meaning this test can be used) for the duration of the COVID-19 declaration under Section 564(b)(1) of the Act, 21 U.S.C. section 360bbb-3(b)(1), unless the authorization is terminated or revoked sooner.  Influenza A by PCR NEGATIVE NEGATIVE Final   Influenza B by PCR NEGATIVE NEGATIVE Final    Comment: (NOTE) The Xpert Xpress SARS-CoV-2/FLU/RSV assay is intended as an aid in  the diagnosis of influenza from Nasopharyngeal swab specimens and  should not be used as a sole basis for treatment. Nasal washings and  aspirates are unacceptable for Xpert Xpress SARS-CoV-2/FLU/RSV  testing. Fact Sheet for Patients: PinkCheek.be Fact Sheet for Healthcare  Providers: GravelBags.it This test is not yet approved or cleared by the [place] FDA and  has been authorized for detection and/or diagnosis of SARS-CoV-2 by  FDA under an Emergency Use Authorization (EUA). This EUA will remain  in effect (meaning this test can be used) for the duration of the  Covid-19 declaration under Section 564(b)(1) of the Act, 21  U.S.C. section 360bbb-3(b)(1), unless the authorization is  terminated or revoked. Performed at [facility], [place] [address] [place], [place] [address]   Surgical PCR screen     Status: None   Collection Time: 09/21/19 10:17 PM   Specimen: Nasal Mucosa; Nasal Swab  Result Value Ref Range Status   MRSA, PCR NEGATIVE NEGATIVE Final   Staphylococcus aureus NEGATIVE NEGATIVE Final    Comment: (NOTE) The Xpert SA Assay (FDA approved for NASAL specimens in patients 46 years of age and older), is one component of a comprehensive surveillance program. It is not intended to diagnose infection nor to guide or monitor treatment. Performed at [facility], [place] [address], [place], [place] [address]      Studies: No results found.  Scheduled Meds: . aspirin  81 mg Oral Daily  . budesonide (PULMICORT) nebulizer solution  0.25 mg Nebulization BID  . clopidogrel  75 mg Oral Daily  . dextromethorphan-guaiFENesin  1 tablet Oral BID  . DULoxetine  60 mg Oral Daily  . feeding supplement (GLUCERNA SHAKE)  237 mL Oral TID BM  . gentamicin irrigation  80 mg Irrigation On Call  . insulin aspart  0-5 Units Subcutaneous QHS  . insulin aspart  0-9 Units Subcutaneous TID WC  . ipratropium-albuterol  3 mL Nebulization BID  . lidocaine-EPINEPHrine  20 mL Intradermal Once  . multivitamin with minerals  1 tablet Oral Daily  . predniSONE  40 mg Oral QAC breakfast  . tamsulosin  0.4 mg Oral QPC breakfast    Continuous Infusions: . sodium chloride    . sodium chloride    .  ceFAZolin (ANCEF) IV 2 g  (09/22/19 0542)  .  ceFAZolin (ANCEF) IV       Time spent: 74mns I have personally reviewed and interpreted on  09/22/2019 daily labs, tele strips, imagings as discussed above under date review session and assessment and plans.  I reviewed all nursing notes, pharmacy notes, consultant notes,  vitals, pertinent old records  I have discussed plan of care as described above with RN , patient  on 09/22/2019   FFlorencia ReasonsMD, PhD, FACP  [facility]  Available via Epic secure chat 7am-7pm for nonurgent issues Please page for urgent issues, pager number available through [email or web address] .   09/22/2019, 6:58 AM  LOS: 6 days

## 2019-09-22 NOTE — Progress Notes (Addendum)
Progress Note  Patient Name: Justin Boone Date of Encounter: 09/22/2019  Primary Cardiologist: Posey Boyer, MD   Subjective   No CP, not rest SOB  Inpatient Medications    Scheduled Meds:  aspirin  81 mg Oral Daily   budesonide (PULMICORT) nebulizer solution  0.25 mg Nebulization BID   clopidogrel  75 mg Oral Daily   dextromethorphan-guaiFENesin  1 tablet Oral BID   DULoxetine  60 mg Oral Daily   feeding supplement (GLUCERNA SHAKE)  237 mL Oral TID BM   gentamicin irrigation  80 mg Irrigation On Call   insulin aspart  0-5 Units Subcutaneous QHS   insulin aspart  0-9 Units Subcutaneous TID WC   ipratropium-albuterol  3 mL Nebulization BID   lidocaine-EPINEPHrine  20 mL Intradermal Once   multivitamin with minerals  1 tablet Oral Daily   predniSONE  40 mg Oral QAC breakfast   tamsulosin  0.4 mg Oral QPC breakfast   Continuous Infusions:  sodium chloride     sodium chloride      ceFAZolin (ANCEF) IV 2 g (09/22/19 0542)    ceFAZolin (ANCEF) IV     PRN Meds: acetaminophen **OR** acetaminophen, albuterol, HYDROcodone-acetaminophen, morphine injection   Vital Signs    Vitals:   09/21/19 2000 09/21/19 2202 09/22/19 0400 09/22/19 0500  BP: (!) 152/87 138/77 (!) 130/92   Pulse: (!) 113 (!) 118 (!) 117 (!) 117  Resp: 19 19 16 17   Temp: 98.1 F (36.7 C)     TempSrc: Oral     SpO2: 100% 98% 97% 99%  Weight:   97.1 kg   Height:        Intake/Output Summary (Last 24 hours) at 09/22/2019 0800 Last data filed at 09/22/2019 0500 Gross per 24 hour  Intake 940 ml  Output 1150 ml  Net -210 ml   Last 3 Weights 09/22/2019 09/21/2019 09/20/2019  Weight (lbs) 214 lb 211 lb 212 lb  Weight (kg) 97.07 kg 95.709 kg 96.163 kg      Telemetry    AFlutter 120's, intermittent PVCs - Personally Reviewed  ECG    No new EKGs - Personally Reviewed  Physical Exam   GEN: No acute distress.   Neck: No JVD Cardiac: irreg-irreg, tachycardic, no murmurs, rubs, or gallops.   Respiratory: end exp wheezes, moving air well, no crackles appreciated GI: Soft, nontender, non-distended  MS: No edema; LLE in a knee immobilzer Neuro:  Nonfocal  Psych: Normal affect   Labs    High Sensitivity Troponin:  No results for input(s): TROPONINIHS in the last 720 hours.    Chemistry Recent Labs  Lab 09/16/19 1817 09/16/19 1820 09/20/19 0533 09/21/19 0646 09/22/19 0502  NA 137   < > 143 139 143  K 4.7   < > 4.7 4.3 4.7  CL 103   < > 104 100 104  CO2 24   < > 29 29 30   GLUCOSE 134*   < > 153* 159* 151*  BUN 45*   < > 46* 45* 47*  CREATININE 2.87*   < > 2.33* 2.01* 1.96*  CALCIUM 8.9   < > 8.7* 8.7* 8.9  PROT 5.7*  --   --   --   --   ALBUMIN 2.8*  --   --   --   --   AST 18  --   --   --   --   ALT 15  --   --   --   --  ALKPHOS 110  --   --   --   --   BILITOT 1.1  --   --   --   --   GFRNONAA 22*   < > 28* 33* 34*  GFRAA 25*   < > 32* 39* 40*  ANIONGAP 10   < > 10 10 9    < > = values in this interval not displayed.     Hematology Recent Labs  Lab 09/19/19 1223 09/19/19 1814 09/20/19 0533 09/20/19 1202 09/21/19 0117 09/21/19 0646 09/22/19 0502  WBC 9.4  --  8.7  --   --   --  9.6  RBC 3.19*  --  3.15*  --   --   --  3.28*  HGB 9.1*   < > 9.0*   < > 10.0* 9.6* 9.5*  HCT 28.8*   < > 28.4*   < > 31.3* 30.9* 30.5*  MCV 90.3  --  90.2  --   --   --  93.0  MCH 28.5  --  28.6  --   --   --  29.0  MCHC 31.6  --  31.7  --   --   --  31.1  RDW 20.6*  --  20.7*  --   --   --  21.2*  PLT 97*  --  124*  --   --   --  131*   < > = values in this interval not displayed.    BNPNo results for input(s): BNP, PROBNP in the last 168 hours.   DDimer No results for input(s): DDIMER in the last 168 hours.   Radiology    No results found.  Cardiac Studies   Echo 09/17/19 1. Left ventricular ejection fraction, by estimation, is 40 to 45%. The  left ventricle has mildly decreased function. The left ventricle  demonstrates global hypokinesis. The left  ventricular internal cavity size  was mildly dilated. Left ventricular  diastolic parameters are consistent with Grade II diastolic dysfunction  (pseudonormalization). Elevated left atrial pressure.   2. Right ventricular systolic function is mildly reduced. The right  ventricular size is mildly enlarged. There is moderately elevated  pulmonary artery systolic pressure.   3. Left atrial size was mildly dilated.   4. Right atrial size was mildly dilated.   5. The mitral valve is normal in structure. Mild mitral valve  regurgitation.   6. The aortic valve is tricuspid. Aortic valve regurgitation is mild.  Severe aortic valve stenosis.   7. The inferior vena cava is dilated in size with <50% respiratory  variability, suggesting right atrial pressure of 15 mmHg.    ECHO: 06/20/2019 at [facility]   1. Doppler indices suggest severe low-flow, low-gradient aortic valve stenosis.   2. There is mild aortic regurgitation.   3. The left ventricle is mildly dilated in size with normal wall thickness.   4. The left ventricular systolic function is moderately decreased, LVEF is visually estimated at 40%.   5. The mitral valve leaflets are mildly thickened with normal leaflet mobility.   6. There is moderate mitral valve regurgitation.   7. The left atrium is moderately dilated in size.   8. The right ventricle is mildly dilated in size, with mildly reduced systolic function.   9. The right atrium is moderately dilated  in size.     CT cardiac TAVR: 09/09/2019 Vascular access measurements: Left common iliac artery: 10 x 7 mm Left external iliac artery: 9 x  7 cm Right common iliac artery: 10 x 7 mm Right external iliac artery: 8 x 6 mm.  1. Aortic annulus diameter is 27 mm. There is dense calcification of the aortic valve with an Agatston score of 839. 2. Three-vessel coronary artery atherosclerosis. 3. Moderate biatrial dilation. 4. Please see concurrent CTA chest abdomen pelvis for  more accurate evaluation of noncardiac findings.   CATH: At [facility], 08/10/2018 Left Heart Catheterization (Left Radial) Under Lidocaine 2% local anesthesia, a 28F Terumo Glidesheath was placed in  the left radial artery using modified Seldinger technique. 5 mg of  verapamil were given via the sheath intra-arterially. A 0.035 Rosen wire  (260cm) was used to guide the advancement of the coronary catheter for  engagement. 4,000 units of heparin were given after the wire and catheter  crossed the aortic arch into the ascending aorta. Selective coronary  angiography was then performed in multiple views by hand injections of  Omnipaque using a 28F JR4 catheter to engage the right coronary artery and  a 28F JL4 catheter to engage the left coronary artery.  The culprit lesion was identified percutaneous coronary intervention  followed as described below.  Percutaneous Coronary Intervention The decision was made to intervene on the RCA.  PCI: Percutaneous coronary intervention performed of the mRCA  Anticoagulants:  Routine, ASA, Clopidogrel, Unfractionated Heparin Guide catheter: JR4  Guide Wire: 0.014" Prowater Pre-Dilation Balloon: 2.5 mm  Stent:   Synergy 3.5x16 mm DES Post-Dilation Balloon:  N/a Result:   Excellent angiographic result with TIMI 3 flow  Following the procedure, the sheath was removed, and a TR band was applied  to achieve hemostasis. The TR band was subsequently removed with staged  removal of air as per protocol. The patient tolerated the procedure well  without any complications.  IVUS An [place] IVUS catheter was advanced down the wire and used to  visualize the leftand right CIA,EIA, and CFA. There was mild plaquing  throughout the inflow vessel. No significant stenoses noted with CFA  around 7.5-8.33mm  FINDINGS  Hemodynamics and Left Heart Catheterization  Aortic pressure: 105/70 mm Hg (mean 86 mm Hg)  Coronary Angiography  Dominance: Right  Left  Main: The left main coronary artery (LMCA) is a large-caliber vessel  that originates from the left coronary sinus. It bifurcates into the left  anterior descending (LAD) and left circumflex (LCx) arteries. There is no  angiographic evidence of significant disease in the LMCA.  LAD: The LAD is a large-caliber vessel that gives off 2 diagonal (D)  branches before it wraps around the apex. High D1 is a large-caliber,  branching vessel. D2 is a moderate-caliber vessel with a 90% stenosis in  the proximal segment. There is no other angiographic evidence of  significant disease in the LAD.  Left Circumflex: The LCx is a large-caliber vessel that gives off 3 obtuse  marginal (OM) branches and then continues as a small vessel in the AV  groove. OM1 is a large-caliber vessel. OM2 is a moderate-caliber vessel.  OM3 is a large-caliber vessel. There is no angiographic evidence of  significant disease in the LCx.  Right Coronary: The right coronary artery (RCA) is a large-caliber vessel  originating from the right coronary sinus. It bifurcates distally into a  large-caliber posterior descending artery (PDA) and small posterolateral  (PL) branches consistent with a right dominant system. There is a 90%  stenosis of the mRCA.      Patient Profile     69 y.o.  male with a hx of COPD, OSA w/CPAP, HTN, DM, CKD (III), AFib/flutter, chronic CHF (systolic), VHD w/ mod MR and severe AS, CAD  Outpatient recent evaluation for TAVR at [facility] (was planned for today initially) though delayed given hs admission here.  Admitted to [facility] with recurrent syncope. Justin Boone with very large laceration associated with significant bleeding s/p KCentra and vitamin K and PRBC, required ortho to close wound  Found bradycardic 30's-50's home BB and amiodarone stopped with improvement in HR >> eventually unfortunately  AFib/flutter w/RVR He has been diuresed as well for volume OL  Assessment & Plan     1. Syncope 2. Paroxysmal AFib/flutter      CHA2DS2Vasc is 5, Eliquis held post knee Boone 3. Tachy-brady     Planned for pacing today  Dr. Curt Bears has seen and examined the patient, reviewed procedure, potential risks/benefits, he is agreeable to proceed. He has CM with LVEF 40's, though RBBB, not planned for CRT  Discussed with patient Justin Boone need to trim his beard, he is OK with that  Resume his rate controlling meds post pacing In d/w attending, recommendation from laceration perspective is 1 week (tomorrow) to resume Eliquis Justin Boone see how his pocket looks, given he Justin Boone be on triple therapy Justin Boone discuss with cardiology team, when one of his antiplatelets can be stopped has been > 30 days since his PCI   4. Acute/chronic CHF     Cumulatively neg 3831ml, feeling better     Continue with general cardiology team   5. CAD     PCI last month     Back on his ASA/Plavix     No CP         For questions or updates, please contact Justin Boone Please consult [email or web address] for contact info under        Signed, Justin Jamaica, PA-C  09/22/2019, 8:00 AM    I have seen and examined this patient with Justin Boone.  Agree with above, note added to reflect my findings.  On exam, tachycardic, irregular. Patient converted to atrial flutter overnight. He remains in atrial flutter today but feels well. He would likely benefit from pacemaker implant. Risks and benefits were discussed which include bleeding, tamponade, infection, pneumothorax. The patient does have a low ejection fraction and potentially would benefit from CRT implant. Additionally, he has plans for TAVR and has a right bundle branch block and would potentially need ventricular pacing. We Kolden Dupee plan for later today.  Justin Vanover M. Llewyn Heap MD 09/22/2019 12:06 PM

## 2019-09-23 ENCOUNTER — Inpatient Hospital Stay (HOSPITAL_COMMUNITY): Payer: Medicare HMO

## 2019-09-23 DIAGNOSIS — I5022 Chronic systolic (congestive) heart failure: Secondary | ICD-10-CM | POA: Diagnosis not present

## 2019-09-23 DIAGNOSIS — I495 Sick sinus syndrome: Secondary | ICD-10-CM | POA: Diagnosis not present

## 2019-09-23 DIAGNOSIS — I350 Nonrheumatic aortic (valve) stenosis: Secondary | ICD-10-CM | POA: Diagnosis not present

## 2019-09-23 LAB — CBC
HCT: 30.5 % — ABNORMAL LOW (ref 39.0–52.0)
Hemoglobin: 9.4 g/dL — ABNORMAL LOW (ref 13.0–17.0)
MCH: 28.9 pg (ref 26.0–34.0)
MCHC: 30.8 g/dL (ref 30.0–36.0)
MCV: 93.8 fL (ref 80.0–100.0)
Platelets: 149 10*3/uL — ABNORMAL LOW (ref 150–400)
RBC: 3.25 MIL/uL — ABNORMAL LOW (ref 4.22–5.81)
RDW: 21.7 % — ABNORMAL HIGH (ref 11.5–15.5)
WBC: 10.7 10*3/uL — ABNORMAL HIGH (ref 4.0–10.5)
nRBC: 0.3 % — ABNORMAL HIGH (ref 0.0–0.2)

## 2019-09-23 LAB — GLUCOSE, CAPILLARY
Glucose-Capillary: 123 mg/dL — ABNORMAL HIGH (ref 70–99)
Glucose-Capillary: 148 mg/dL — ABNORMAL HIGH (ref 70–99)
Glucose-Capillary: 143 mg/dL — ABNORMAL HIGH (ref 70–99)
Glucose-Capillary: 95 mg/dL (ref 70–99)

## 2019-09-23 LAB — BASIC METABOLIC PANEL
Anion gap: 15 (ref 5–15)
BUN: 56 mg/dL — ABNORMAL HIGH (ref 8–23)
CO2: 23 mmol/L (ref 22–32)
Calcium: 8.5 mg/dL — ABNORMAL LOW (ref 8.9–10.3)
Chloride: 104 mmol/L (ref 98–111)
Creatinine, Ser: 2.04 mg/dL — ABNORMAL HIGH (ref 0.61–1.24)
GFR calc Af Amer: 38 mL/min — ABNORMAL LOW (ref >60)
GFR calc non Af Amer: 33 mL/min — ABNORMAL LOW (ref >60)
Glucose, Bld: 122 mg/dL — ABNORMAL HIGH (ref 70–99)
Potassium: 4.8 mmol/L (ref 3.5–5.1)
Sodium: 142 mmol/L (ref 135–145)

## 2019-09-23 MED ORDER — ZOLPIDEM TARTRATE 5 MG PO TABS
5.0000 mg | ORAL_TABLET | Freq: Every evening | ORAL | Status: AC | PRN
  Administered 2019-09-23: 5 mg via ORAL
  Filled 2019-09-23: qty 1

## 2019-09-23 MED ORDER — CEPHALEXIN 250 MG PO CAPS
500.0000 mg | ORAL_CAPSULE | Freq: Three times a day (TID) | ORAL | Status: AC
  Administered 2019-09-23 – 2019-09-26 (×9): 500 mg via ORAL
  Filled 2019-09-23 (×9): qty 2

## 2019-09-23 MED ORDER — PREDNISONE 20 MG PO TABS
20.0000 mg | ORAL_TABLET | Freq: Every day | ORAL | Status: DC
  Administered 2019-09-24: 20 mg via ORAL
  Filled 2019-09-23: qty 1

## 2019-09-23 MED ORDER — ONDANSETRON HCL 4 MG/2ML IJ SOLN
4.0000 mg | Freq: Four times a day (QID) | INTRAMUSCULAR | Status: DC | PRN
  Administered 2019-09-28: 4 mg via INTRAVENOUS
  Filled 2019-09-23: qty 2

## 2019-09-23 MED ORDER — CEFAZOLIN SODIUM-DEXTROSE 1-4 GM/50ML-% IV SOLN: 1.0000 g | Freq: Four times a day (QID) | INTRAVENOUS | Status: DC

## 2019-09-23 MED ORDER — METOPROLOL TARTRATE 25 MG PO TABS
25.0000 mg | ORAL_TABLET | Freq: Three times a day (TID) | ORAL | Status: DC
  Administered 2019-09-23 – 2019-09-27 (×13): 25 mg via ORAL
  Filled 2019-09-23 (×13): qty 1

## 2019-09-23 MED ORDER — FUROSEMIDE 10 MG/ML IJ SOLN
80.0000 mg | Freq: Two times a day (BID) | INTRAMUSCULAR | Status: DC
  Administered 2019-09-23 – 2019-09-24 (×4): 80 mg via INTRAVENOUS
  Filled 2019-09-23 (×4): qty 8

## 2019-09-23 MED ORDER — METOPROLOL TARTRATE 50 MG PO TABS: 50.0000 mg | ORAL_TABLET | Freq: Two times a day (BID) | ORAL | Status: DC

## 2019-09-23 MED ORDER — AMIODARONE HCL 200 MG PO TABS
400.0000 mg | ORAL_TABLET | Freq: Two times a day (BID) | ORAL | Status: DC
  Administered 2019-09-23 (×2): 400 mg via ORAL
  Filled 2019-09-23 (×2): qty 2

## 2019-09-23 MED FILL — Lidocaine HCl Local Inj 1%: INTRAMUSCULAR | Qty: 60 | Status: AC

## 2019-09-23 NOTE — TOC Progression Note (Addendum)
Transition of Care [facility]) - Progression Note    Patient Details  Name: Emilo Gras MRN: [id number] Date of Birth: 05-Feb-1951  [facility] CM/SW Contact  Zenon Mayo, RN Phone Number: 09/23/2019, 2:24 PM  Clinical Narrative:    NCM spoke with Tommi Rumps with [place] and he states they do not go into  [place] area, also [place] with [facility] , states this is not in network for them in that area.  NCM called [place] and made referral to Lattie Haw, she states to fax information over and she will take a look to see if they can staff the referral.  Awaiting HH orders from MD.  NCM faxed information to agency at [phone]. Attention Lattie Haw.  Address patient is going to is [address] [place] [address].  Patient's PCP per son is Ward Givens [phone] with [facility].    1643- NCM spoke with Lattie Haw at [facility] she states they can see him probably on Monday for [place], [place], [place].  She has accepted the referral.   4/16- MD states that  Cards is going to do cardioversion once inr is therapeutic, so may be med mid week next when he is ready for dc. NCM informed Lattie Haw with [facility].    Expected Discharge Plan: [place] Barriers to Discharge: Continued Medical Work up  Expected Discharge Plan and Services Expected Discharge Plan: [place] arrangements for the past 2 months: Single Family Home                                       Social Determinants of Health (SDOH) Interventions    Readmission Risk Interventions No flowsheet data found.

## 2019-09-23 NOTE — Progress Notes (Signed)
Physical Therapy Treatment Patient Details Name: Justin Boone MRN: [id number] DOB: [birth year] Today's Date: 09/23/2019    History of Present Illness Pt is a 69 y/o male admitted following fall secondary to symptomatic bradycardia. Pt sustained L knee laceration that required sutures. PMH includes COPD, a fib, CKD, CHF, CAD s/p stent placement, OSA on CPAP, and tobacco use. Pacemaker placed 4/14.     PT Comments    Pt in bed on entry, slightly groggy looking but agreeable to getting up with therapy as he has been in bed all day. Pt performing LE exercises when cardiology PA came into check on L shoulder dressing. Provided pt with min A to come to EoB where dressing was fortified. Pt min A for sit>stand and for ambulation to recliner. D/c plans remain appropriate at this time. PT will continue to follow acutely.    Follow Up Recommendations  SNF     Equipment Recommendations  3in1 (PT)       Precautions / Restrictions Precautions Precautions: Fall;ICD/Pacemaker Precaution Comments: Must keep knee extended; no flexion  Required Braces or Orthoses: Knee Immobilizer - Left Knee Immobilizer - Left: Other (comment)(for comfort to keep knee extended) Restrictions Weight Bearing Restrictions: Yes LUE Weight Bearing: Partial weight bearing LUE Partial Weight Bearing Percentage or Pounds: no pulling due to pacemake placement LLE Weight Bearing: Weight bearing as tolerated    Mobility  Bed Mobility Overal bed mobility: Needs Assistance Bed Mobility: Supine to Sit     Supine to sit: Min assist     General bed mobility comments: min A for pt to pull against therapist with R arm, and for pad scoot of hips to EoB  Transfers Overall transfer level: Needs assistance Equipment used: Rolling walker (2 wheeled) Transfers: Sit to/from Stand Sit to Stand: Min assist         General transfer comment: minA for power up from elevated bed  Ambulation/Gait Ambulation/Gait assistance: Min  assist Gait Distance (Feet): 15 Feet Assistive device: Rolling walker (2 wheeled) Gait Pattern/deviations: Step-to pattern;Decreased step length - right;Decreased stance time - left;Antalgic Gait velocity: slowed Gait velocity interpretation: <1.31 ft/sec, indicative of household ambulator General Gait Details: min A for steadying with RW         Balance Overall balance assessment: Needs assistance Sitting-balance support: No upper extremity supported;Feet supported Sitting balance-Leahy Scale: Fair     Standing balance support: Bilateral upper extremity supported;Single extremity supported;During functional activity Standing balance-Leahy Scale: Poor Standing balance comment: Single UE required for balance; pt with x3 LOB episodes at sink                            Cognition Arousal/Alertness: Awake/alert Behavior During Therapy: WFL for tasks assessed/performed Overall Cognitive Status: Within Functional Limits for tasks assessed                                        Exercises General Exercises - Lower Extremity Ankle Circles/Pumps: AROM;Both;10 reps Straight Leg Raises: AROM;5 reps;Left    General Comments General comments (skin integrity, edema, etc.): Pt sat EoB while cardiology reinforced dressing over pacemaker site.       Pertinent Vitals/Pain Pain Assessment: 0-10 Pain Score: 7  Pain Location: L knee Pain Descriptors / Indicators: Grimacing;Guarding;Aching Pain Intervention(s): Limited activity within patient's tolerance;Monitored during session;Repositioned;Patient requesting pain meds-RN notified  PT Goals (current goals can now be found in the care plan section) Acute Rehab PT Goals Patient Stated Goal: to decrease pain  PT Goal Formulation: With patient Time For Goal Achievement: 10/04/19 Potential to Achieve Goals: Good    Frequency    Min 3X/week      PT Plan Current plan remains appropriate        AM-PAC PT "6 Clicks" Mobility   Outcome Measure  Help needed turning from your back to your side while in a flat bed without using bedrails?: A Little Help needed moving from lying on your back to sitting on the side of a flat bed without using bedrails?: A Little Help needed moving to and from a bed to a chair (including a wheelchair)?: A Lot Help needed standing up from a chair using your arms (e.g., wheelchair or bedside chair)?: A Lot Help needed to walk in hospital room?: A Lot Help needed climbing 3-5 steps with a railing? : Total 6 Click Score: 13    End of Session Equipment Utilized During Treatment: Gait belt Activity Tolerance: Patient limited by pain Patient left: with call bell/phone within reach;in chair Nurse Communication: Mobility status;Other (comment)(KI use for OOB) PT Visit Diagnosis: Difficulty in walking, not elsewhere classified (R26.2);Pain Pain - Right/Left: Left Pain - part of body: Knee     Time: 7903-8333 PT Time Calculation (min) (ACUTE ONLY): 34 min  Charges:  $Gait Training: 8-22 mins $Therapeutic Exercise: 8-22 mins                     Justin Boone B. Migdalia Dk PT, DPT Acute Rehabilitation Services Pager [phone] Office [phone]    [place] 09/23/2019, 5:52 PM

## 2019-09-23 NOTE — Progress Notes (Signed)
Cefazolin transition to 3 more days of keflex to complete 10d of therapy per Dr. Erlinda Hong.  Keflex 500mg  PO TID x3d  Onnie Boer, PharmD, BCIDP, AAHIVP, CPP Infectious Disease Pharmacist 09/23/2019 9:14 AM

## 2019-09-23 NOTE — Progress Notes (Addendum)
PROGRESS NOTE  Justin Boone ZOX:[id number] DOB: 26-Mar-1951 DOA: 09/16/2019 PCP: Patient, No Pcp Per  Brief Narrative:  69 year old gentleman prior history of atrial fibrillation on Eliquis, diabetes mellitus, hypertension, COPD, obstructive sleep apnea on CPAP, ongoing tobacco use stage IIIb CKD, atrial fibrillation/flutter s/p cardioversion on amiodarone and Eliquis, chronic systolic heart failure, recent left heart catheterization in the setting of TAVR work-up on 08/12/2019, s/p drug-eluting stent to the LAD on Plavix and Eliquis presents to ED after a fall and left knee laceration.  X-rays of the left knee shows mild to moderate amount of infrapatellar soft tissue swelling and soft tissue air without evidence of acute abnormality.  Due to persistent bleeding orthopedics consulted and suggested to continue with compression wrapping and no indication of urgent washout of the knee in the OR.  Patient started bleeding profusely and persistently from the laceration soaking his dressing, bandages in the bed.  Patient's hemoglobin has dropped from 9.9-7.8 the last 24 hours from profuse bleeding from the left knee laceration.  Discussed with pharmacy regarding Andexxa suggested that this is not a life-threatening bleeding and his last Eliquis dose was on the morning of 09/16/2019, he is out of the window and has been 24 hours since her last Eliquis dose.  He was given  K Centra and vitamin K respiratory anticoagulation antidote protocol. His bleeding has stopped since then and Orthopedics recommended follow-up for left knee laceration evaluation and dressing change in 1 to 2 weeks. Called his cardiology office at [facility], who notified me that they are not going ahead with TAVR at this time as his aortic stenosis is only moderate. 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 Therapy evaluations recommending SNF. TOC on board for placement.     HPI/Recap of past 24 hours:  He  is status post pacemaker placement yesterday He is sleepy this morning,  denies chest pain His tachycardia, heart rate in 110s to 120s  Assessment/Plan: Principal Problem:   Symptomatic bradycardia Active Problems:   Paroxysmal atrial fibrillation (HCC)   QT prolongation   COPD with acute exacerbation (HCC)   AKI (acute kidney injury) (HCC)   Knee laceration, left, initial encounter   Chronic systolic heart failure (HCC)   Severe aortic stenosis   CAD (coronary artery disease)   S/P primary angioplasty with coronary stent   Left knee laceration with profuse and persistent bleeding/acute blood loss anemia on admission  After syncope episode  Bleeding has been controlled with compression bandages which have been changed again by orthopedics.  1 dose of Kcentra given on admission and bleeding stopped.  Orthopedics recommends weightbearing as tolerated on the left lower extremity and to keep the left knee extended and straight for 2 weeks and follow-up with orthopedics in 1 to 2 weeks for laceration evaluation and dressing changes.  Okay to use knee immobilizer for comfort/compliance of knee extension.  We will transition IV antibiotics to oral Keflex on discharge to complete the 10-day course,  elevate extremity and encourage ankle flexion and extension to facilitate movement.  paf with tachy/brady syndrome/syncope Cardiology/ep input appreciated, s/p pacemaker placement on April 14 eliquis held since admission, give pocket concerns, will wait to start apixaban until 09/27/19 (and DC aspirin when this restarted) per cardiology recommendation   Chronic systolic heart failure Echocardiogram showed LVEF of 40 to 45%, with mildly decreased function. The left ventricle shows global hypokinesis. Left ventricular diastolic parameters are consistent with grade 2 diastolic dysfunction.  He is  negative 3.7liters, he started on IV Lasix 80 mg twice a day due to continued signs of volume  overload Follow strict intake and output and daily weights Will follow cardiology recommendation   AKI on stage III CKD Cr 3 on presentaion,  Ultrasound of the kidney did not show any hydronephrosis or obstruction. Creatinine around 2 this am and stable.   Diet controlled dm2 a1c 6 A.m. blood glucose 122  COPD Tobacco abuse Counseled against tobacco use Wheezing has resolved, transitioned to oral prednisone and plan to taper to off.  Weaned off oxygen.   Obstructive sleep apnea on CPAP at night continue the same.  FTT: PT recommend snf placement  DVT Prophylaxis: resume eliquis when ok with cardiology  Code Status: full  Family Communication: patient   Disposition Plan:    Patient came from:         home                                                                                                 Anticipated d/c place:  he declined SNF, will arrange home health Barriers to d/c OR conditions which need to be met to effect a safe  Tachycardia and volume overload, on IV Lasix, need cardiology clearance  Consultants:  Cardiology/EP  Ortho  Critical care  Wound care  Procedures:  prbc transfusion  Pacemaker placement  Antibiotics:  Rocephin then Keflex   Objective: BP 120/84 (BP Location: Right Arm)   Pulse (!) 114   Temp 98.8 F (37.1 C) (Oral)   Resp 16   Ht 6' (1.829 m)   Wt 99.8 kg   SpO2 100%   BMI 29.84 kg/m   Intake/Output Summary (Last 24 hours) at 09/23/2019 1329 Last data filed at 09/23/2019 1100 Gross per 24 hour  Intake 1080 ml  Output 825 ml  Net 255 ml   Filed Weights   09/21/19 0509 09/22/19 0400 09/23/19 0021  Weight: 95.7 kg 97.1 kg 99.8 kg    Exam: Patient is examined daily including today on 09/23/2019, exams remain the same as of yesterday except that has changed    General:  NAD  Cardiovascular: IRRR,  tachycardia  Respiratory: CTABL  Abdomen: Soft/ND/NT, positive BS  Musculoskeletal: left knee in  immobolizer, lower extremity edema  Neuro: alert, oriented   Data Reviewed: Basic Metabolic Panel: Recent Labs  Lab 09/16/19 2352 09/17/19 0821 09/19/19 1223 09/20/19 0533 09/21/19 0646 09/22/19 0502 09/23/19 0601  NA  --    < > 141 143 139 143 142  K  --    < > 4.2 4.7 4.3 4.7 4.8  CL  --    < > 102 104 100 104 104  CO2  --    < > 28 29 29 30 23   GLUCOSE  --    < > 148* 153* 159* 151* 122*  BUN  --    < > 50* 46* 45* 47* 56*  CREATININE  --    < > 2.37* 2.33* 2.01* 1.96* 2.04*  CALCIUM  --    < > 8.6* 8.7* 8.7* 8.9  8.5*  MG 2.2  --   --   --  2.3  --   --    < > = values in this interval not displayed.   Liver Function Tests: Recent Labs  Lab 09/16/19 1817  AST 18  ALT 15  ALKPHOS 110  BILITOT 1.1  PROT 5.7*  ALBUMIN 2.8*   No results for input(s): LIPASE, AMYLASE in the last 168 hours. No results for input(s): AMMONIA in the last 168 hours. CBC: Recent Labs  Lab 09/18/19 0550 09/18/19 1230 09/19/19 1223 09/19/19 1814 09/20/19 0533 09/20/19 1202 09/20/19 1811 09/21/19 0117 09/21/19 0646 09/22/19 0502 09/23/19 0601  WBC 9.2  --  9.4  --  8.7  --   --   --   --  9.6 10.7*  NEUTROABS  --   --  8.1*  --   --   --   --   --   --   --   --   HGB 7.9*   < > 9.1*   < > 9.0*   < > 9.4* 10.0* 9.6* 9.5* 9.4*  HCT 24.8*   < > 28.8*   < > 28.4*   < > 29.6* 31.3* 30.9* 30.5* 30.5*  MCV 89.5  --  90.3  --  90.2  --   --   --   --  93.0 93.8  PLT 102*  --  97*  --  124*  --   --   --   --  131* 149*   < > = values in this interval not displayed.   Cardiac Enzymes:   No results for input(s): CKTOTAL, CKMB, CKMBINDEX, TROPONINI in the last 168 hours. BNP (last 3 results) No results for input(s): BNP in the last 8760 hours.  ProBNP (last 3 results) No results for input(s): PROBNP in the last 8760 hours.  CBG: Recent Labs  Lab 09/22/19 0610 09/22/19 1119 09/22/19 2110 09/23/19 0624 09/23/19 1059  GLUCAP 134* 151* 173* 123* 148*    Recent Results (from the  past 240 hour(s))  Respiratory Panel by RT PCR (Flu A&B, Covid) - Nasopharyngeal Swab     Status: None   Collection Time: 09/16/19  8:24 PM   Specimen: Nasopharyngeal Swab  Result Value Ref Range Status   SARS Coronavirus 2 by RT PCR NEGATIVE NEGATIVE Final    Comment: (NOTE) SARS-CoV-2 target nucleic acids are NOT DETECTED. The SARS-CoV-2 RNA is generally detectable in upper respiratoy specimens during the acute phase of infection. The lowest concentration of SARS-CoV-2 viral copies this assay can detect is 131 copies/mL. A negative result does not preclude SARS-Cov-2 infection and should not be used as the sole basis for treatment or other patient management decisions. A negative result may occur with  improper specimen collection/handling, submission of specimen other than nasopharyngeal swab, presence of viral mutation(s) within the areas targeted by this assay, and inadequate number of viral copies (<131 copies/mL). A negative result must be combined with clinical observations, patient history, and epidemiological information. The expected result is Negative. Fact Sheet for Patients:  PinkCheek.be Fact Sheet for Healthcare Providers:  GravelBags.it This test is not yet ap proved or cleared by the [place] FDA and  has been authorized for detection and/or diagnosis of SARS-CoV-2 by FDA under an Emergency Use Authorization (EUA). This EUA will remain  in effect (meaning this test can be used) for the duration of the COVID-19 declaration under Section 564(b)(1) of the Act, 21 U.S.C. section 360bbb-3(b)(1),  unless the authorization is terminated or revoked sooner.    Influenza A by PCR NEGATIVE NEGATIVE Final   Influenza B by PCR NEGATIVE NEGATIVE Final    Comment: (NOTE) The Xpert Xpress SARS-CoV-2/FLU/RSV assay is intended as an aid in  the diagnosis of influenza from Nasopharyngeal swab specimens and  should not be  used as a sole basis for treatment. Nasal washings and  aspirates are unacceptable for Xpert Xpress SARS-CoV-2/FLU/RSV  testing. Fact Sheet for Patients: PinkCheek.be Fact Sheet for Healthcare Providers: GravelBags.it This test is not yet approved or cleared by the [place] FDA and  has been authorized for detection and/or diagnosis of SARS-CoV-2 by  FDA under an Emergency Use Authorization (EUA). This EUA will remain  in effect (meaning this test can be used) for the duration of the  Covid-19 declaration under Section 564(b)(1) of the Act, 21  U.S.C. section 360bbb-3(b)(1), unless the authorization is  terminated or revoked. Performed at [facility], [place] [address], [place], [place] [address]   Surgical PCR screen     Status: None   Collection Time: 09/21/19 10:17 PM   Specimen: Nasal Mucosa; Nasal Swab  Result Value Ref Range Status   MRSA, PCR NEGATIVE NEGATIVE Final   Staphylococcus aureus NEGATIVE NEGATIVE Final    Comment: (NOTE) The Xpert SA Assay (FDA approved for NASAL specimens in patients 63 years of age and older), is one component of a comprehensive surveillance program. It is not intended to diagnose infection nor to guide or monitor treatment. Performed at [facility], [place] [address] [place], [place] [address]      Studies: DG Chest 2 View  Result Date: 09/23/2019 CLINICAL DATA:  69 year old male status post pacemaker insertion. EXAM: CHEST - 2 VIEW COMPARISON:  Chest x-ray 09/18/2019. FINDINGS: Lung volumes are normal. No consolidative airspace disease. Small bilateral pleural effusions. There is cephalization of the pulmonary vasculature and slight indistinctness of the interstitial markings suggestive of mild pulmonary edema. No pneumothorax. Bibasilar opacities favored to reflect areas of subsegmental atelectasis. Mild cardiomegaly. Aortic atherosclerosis. New left-sided biventricular  pacemaker with lead tips projecting over the expected location of the right atrium, right ventricle and overlying the left ventricle via the coronary sinus and coronary veins. IMPRESSION: 1. New left-sided biventricular pacemaker appears well positioned with no pneumothorax or other acute complicating features. 2. The appearance of the chest suggest congestive heart failure, as above. 3. Aortic atherosclerosis. Electronically Signed   By: Vinnie Langton M.D.   On: 09/23/2019 07:23    Scheduled Meds: . amiodarone  400 mg Oral BID  . aspirin  81 mg Oral Daily  . budesonide (PULMICORT) nebulizer solution  0.25 mg Nebulization BID  . cephALEXin  500 mg Oral Q8H  . clopidogrel  75 mg Oral Daily  . dextromethorphan-guaiFENesin  1 tablet Oral BID  . DULoxetine  60 mg Oral Daily  . feeding supplement (GLUCERNA SHAKE)  237 mL Oral TID BM  . furosemide  80 mg Intravenous BID  . insulin aspart  0-5 Units Subcutaneous QHS  . insulin aspart  0-9 Units Subcutaneous TID WC  . ipratropium-albuterol  3 mL Nebulization BID  . lidocaine-EPINEPHrine  20 mL Intradermal Once  . metoprolol tartrate  25 mg Oral Q8H  . multivitamin with minerals  1 tablet Oral Daily  . predniSONE  40 mg Oral QAC breakfast  . tamsulosin  0.4 mg Oral QPC breakfast    Continuous Infusions: .  ceFAZolin (ANCEF) IV 2 g (  09/23/19 0518)     Time spent: 102mns I have personally reviewed and interpreted on  09/23/2019 daily labs, tele strips, imagings as discussed above under date review session and assessment and plans.  I reviewed all nursing notes, pharmacy notes, consultant notes,  vitals, pertinent old records  I have discussed plan of care as described above with RN , patient  on 09/23/2019   FFlorencia ReasonsMD, PhD, FACP  [facility]  Available via Epic secure chat 7am-7pm for nonurgent issues Please page for urgent issues, pager number available through [email or web address] .   09/23/2019, 1:29 PM  LOS: 7 days

## 2019-09-23 NOTE — Progress Notes (Signed)
Progress Note  Patient Name: Justin Boone Date of Encounter: 09/23/2019  Primary Cardiologist: Posey Boyer, MD   Subjective   S/P biventricular PPM yesterday, EP notes reviewed. He is tired but breathing is stable, no further chest pain. No significant discomfort at device site unless pressed on, then somewhat tender. I spoke to his son over the phone, while in the room with the patient, and gave an update.  Inpatient Medications    Scheduled Meds: . amiodarone  400 mg Oral BID  . aspirin  81 mg Oral Daily  . budesonide (PULMICORT) nebulizer solution  0.25 mg Nebulization BID  . cephALEXin  500 mg Oral Q8H  . clopidogrel  75 mg Oral Daily  . dextromethorphan-guaiFENesin  1 tablet Oral BID  . DULoxetine  60 mg Oral Daily  . feeding supplement (GLUCERNA SHAKE)  237 mL Oral TID BM  . furosemide  80 mg Intravenous BID  . insulin aspart  0-5 Units Subcutaneous QHS  . insulin aspart  0-9 Units Subcutaneous TID WC  . ipratropium-albuterol  3 mL Nebulization BID  . lidocaine-EPINEPHrine  20 mL Intradermal Once  . metoprolol tartrate  25 mg Oral Q8H  . multivitamin with minerals  1 tablet Oral Daily  . predniSONE  40 mg Oral QAC breakfast  . tamsulosin  0.4 mg Oral QPC breakfast   Continuous Infusions: .  ceFAZolin (ANCEF) IV 2 g (09/23/19 0518)   PRN Meds: acetaminophen **OR** acetaminophen, HYDROcodone-acetaminophen, morphine injection, ondansetron (ZOFRAN) IV   Vital Signs    Vitals:   09/23/19 0853 09/23/19 0857 09/23/19 1100 09/23/19 1129  BP:   120/84   Pulse: (!) 114     Resp: 16     Temp:   97.8 F (36.6 C) 98.8 F (37.1 C)  TempSrc:   Oral Oral  SpO2: 97% 97% 100%   Weight:      Height:        Intake/Output Summary (Last 24 hours) at 09/23/2019 1156 Last data filed at 09/23/2019 1100 Gross per 24 hour  Intake 1080 ml  Output 825 ml  Net 255 ml   Last 3 Weights 09/23/2019 09/22/2019 09/21/2019  Weight (lbs) 220 lb 214 lb 211 lb  Weight (kg) 99.791 kg  97.07 kg 95.709 kg      Telemetry    Atrial fibrillation, rates >100, with frequent PVCs - Personally Reviewed  ECG    No new since 09/17/19 - Personally Reviewed  Physical Exam   GEN: Well nourished, well developed in no acute distress HEENT: Normal, moist mucous membranes NECK: No JVD visible sitting at 45 degrees CARDIAC: tachycardic, irregularly irregular rhythm, normal S1 and S2, no rubs or gallops. 3/6 SE murmur. VASCULAR: Radial pulses 2+ bilaterally RESPIRATORY:  Diffusely mildly coarse without clear crackles, slight end expiratory wheeze ABDOMEN: Soft, non-tender, non-distended MUSCULOSKELETAL:  LLE in splint/immobilizer SKIN: Warm and dry, trivial LE edema NEUROLOGIC:  Alert and oriented x 3. No focal neuro deficits noted. PSYCHIATRIC:  Normal affect   Labs    High Sensitivity Troponin:  No results for input(s): TROPONINIHS in the last 720 hours.    Chemistry Recent Labs  Lab 09/16/19 1817 09/16/19 1820 09/21/19 0646 09/22/19 0502 09/23/19 0601  NA 137   < > 139 143 142  K 4.7   < > 4.3 4.7 4.8  CL 103   < > 100 104 104  CO2 24   < > 29 30 23   GLUCOSE 134*   < > 159* 151* 122*  BUN 45*   < > 45* 47* 56*  CREATININE 2.87*   < > 2.01* 1.96* 2.04*  CALCIUM 8.9   < > 8.7* 8.9 8.5*  PROT 5.7*  --   --   --   --   ALBUMIN 2.8*  --   --   --   --   AST 18  --   --   --   --   ALT 15  --   --   --   --   ALKPHOS 110  --   --   --   --   BILITOT 1.1  --   --   --   --   GFRNONAA 22*   < > 33* 34* 33*  GFRAA 25*   < > 39* 40* 38*  ANIONGAP 10   < > 10 9 15    < > = values in this interval not displayed.     Hematology Recent Labs  Lab 09/20/19 0533 09/20/19 1202 09/21/19 0646 09/22/19 0502 09/23/19 0601  WBC 8.7  --   --  9.6 10.7*  RBC 3.15*  --   --  3.28* 3.25*  HGB 9.0*   < > 9.6* 9.5* 9.4*  HCT 28.4*   < > 30.9* 30.5* 30.5*  MCV 90.2  --   --  93.0 93.8  MCH 28.6  --   --  29.0 28.9  MCHC 31.7  --   --  31.1 30.8  RDW 20.7*  --   --  21.2*  21.7*  PLT 124*  --   --  131* 149*   < > = values in this interval not displayed.    BNPNo results for input(s): BNP, PROBNP in the last 168 hours.   DDimer No results for input(s): DDIMER in the last 168 hours.   Radiology    DG Chest 2 View  Result Date: 09/23/2019 CLINICAL DATA:  69 year old male status post pacemaker insertion. EXAM: CHEST - 2 VIEW COMPARISON:  Chest x-ray 09/18/2019. FINDINGS: Lung volumes are normal. No consolidative airspace disease. Small bilateral pleural effusions. There is cephalization of the pulmonary vasculature and slight indistinctness of the interstitial markings suggestive of mild pulmonary edema. No pneumothorax. Bibasilar opacities favored to reflect areas of subsegmental atelectasis. Mild cardiomegaly. Aortic atherosclerosis. New left-sided biventricular pacemaker with lead tips projecting over the expected location of the right atrium, right ventricle and overlying the left ventricle via the coronary sinus and coronary veins. IMPRESSION: 1. New left-sided biventricular pacemaker appears well positioned with no pneumothorax or other acute complicating features. 2. The appearance of the chest suggest congestive heart failure, as above. 3. Aortic atherosclerosis. Electronically Signed   By: Vinnie Langton M.D.   On: 09/23/2019 07:23    Cardiac Studies   Echo 09/17/19 1. Left ventricular ejection fraction, by estimation, is 40 to 45%. The  left ventricle has mildly decreased function. The left ventricle  demonstrates global hypokinesis. The left ventricular internal cavity size  was mildly dilated. Left ventricular  diastolic parameters are consistent with Grade II diastolic dysfunction  (pseudonormalization). Elevated left atrial pressure.  2. Right ventricular systolic function is mildly reduced. The right  ventricular size is mildly enlarged. There is moderately elevated  pulmonary artery systolic pressure.  3. Left atrial size was mildly dilated.   4. Right atrial size was mildly dilated.  5. The mitral valve is normal in structure. Mild mitral valve  regurgitation.  6. The aortic valve is tricuspid. Aortic valve  regurgitation is mild.  Severe aortic valve stenosis.  7. The inferior vena cava is dilated in size with <50% respiratory  variability, suggesting right atrial pressure of 15 mmHg.   Patient Profile     69 y.o. male with known severe AS, planned for [facility] at [facility] 09/22/19, paroxysmal atrial fibrillation, chronic systolic dysfunction with EF 40%, CAD with DES to RCA 08/13/19, hypertension, type II diabetes, chronic kidney disease who was admitted/for whom we are consulted for symptomatic bradycardia and possible syncope.  Assessment & Plan    Severe aortic stenosis, presumed low flow-low gradient given EF 40%: -initially planned for TAVR 09/22/19 at [facility]. This has been placed on hold per the patient given his current admission. He is debating if he should have an evaluation with the [facility] team, since he has had his admission here. I told him we are happy to help either way, whether he elects to pursue at [facility] or here.   Chronic systolic heart failure: -weight up again today,  though unclear if weights accurate. Noted as 86.2 kg on admission, currently 99.8 kg -first several days of admission without well charted ins/outs, so difficult to assess cumulative volume balance -has chronic kidney disease, monitor Cr. Cr 2.04 today, up slightly  Symptomatic bradycardia: -now s/p BiV PPM 09/22/19  Paroxysmal atrial fibrillation: -metoprolol, amiodarone restarted per EP recommendations now that pacemaker in place -give pocket concerns, will wait to start apixaban until 09/27/19 (and DC aspirin when this restarted) -CHA2DS2/VAS Stroke Risk Points=3 (age, HF, CAD) -heart rate slowly improving with medication restart  CAD with recent DES 08/13/19: -when DOAC restarted, drop aspirin and continue clopidogrel and DOAC  Discharge  recommendations pending on heart rate control and volume status, not today but TBD based on response to treatment. We will continue to track this.  For questions or updates, please contact [facility] Please consult [email or web address] for contact info under    Signed, Buford Dresser, MD  09/23/2019, 11:56 AM

## 2019-09-23 NOTE — Progress Notes (Addendum)
Progress Note  Patient Name: Justin Boone Date of Encounter: 09/23/2019  Primary Cardiologist: Posey Boyer, MD   Subjective   No CP, not rest SOB, mild tenderness at [facility] site  Inpatient Medications    Scheduled Meds: . aspirin  81 mg Oral Daily  . budesonide (PULMICORT) nebulizer solution  0.25 mg Nebulization BID  . clopidogrel  75 mg Oral Daily  . dextromethorphan-guaiFENesin  1 tablet Oral BID  . DULoxetine  60 mg Oral Daily  . feeding supplement (GLUCERNA SHAKE)  237 mL Oral TID BM  . insulin aspart  0-5 Units Subcutaneous QHS  . insulin aspart  0-9 Units Subcutaneous TID WC  . ipratropium-albuterol  3 mL Nebulization BID  . lidocaine-EPINEPHrine  20 mL Intradermal Once  . metoprolol tartrate  25 mg Oral BID  . multivitamin with minerals  1 tablet Oral Daily  . predniSONE  40 mg Oral QAC breakfast  . tamsulosin  0.4 mg Oral QPC breakfast   Continuous Infusions: .  ceFAZolin (ANCEF) IV 2 g (09/23/19 0518)   PRN Meds: acetaminophen **OR** acetaminophen, HYDROcodone-acetaminophen, morphine injection, ondansetron (ZOFRAN) IV   Vital Signs    Vitals:   09/23/19 0021 09/23/19 0545 09/23/19 0546 09/23/19 0759  BP:  (!) 129/92  117/76  Pulse:  (!) 111 (!) 110   Resp:  (!) 21 17   Temp: 97.7 F (36.5 C) 98 F (36.7 C)  98.2 F (36.8 C)  TempSrc:    Oral  SpO2: 100% 99% 98%   Weight: 99.8 kg     Height:        Intake/Output Summary (Last 24 hours) at 09/23/2019 0833 Last data filed at 09/23/2019 0626 Gross per 24 hour  Intake 720 ml  Output 1025 ml  Net -305 ml   Last 3 Weights 09/23/2019 09/22/2019 09/21/2019  Weight (lbs) 220 lb 214 lb 211 lb  Weight (kg) 99.791 kg 97.07 kg 95.709 kg      Telemetry    AFlutter 110's-120's, intermittent PVC, frequent tracking noted - Personally Reviewed  ECG    AFlutter 114bpm - Personally Reviewed  Physical Exam   GEN: No acute distress.   Neck: No JVD Cardiac: irreg-irreg, tachycardic, no murmurs, rubs, or  gallops.  Respiratory: end exp wheezes, moving air well, no crackles appreciated GI: Soft, nontender, non-distended  MS: No edema; LLE in a knee immobilzer Neuro:  Nonfocal  Psych: Normal affect   PPM site: pocket pal had shifted superior, tegaderm was off and medial 3 steristrips were off, though there was no frank wound exposure. No hematoma, + ecchymosis New ster-strips were applied over the exsisting/re-enforced  Labs    High Sensitivity Troponin:  No results for input(s): TROPONINIHS in the last 720 hours.    Chemistry Recent Labs  Lab 09/16/19 1817 09/16/19 1820 09/21/19 0646 09/22/19 0502 09/23/19 0601  NA 137   < > 139 143 142  K 4.7   < > 4.3 4.7 4.8  CL 103   < > 100 104 104  CO2 24   < > 29 30 23   GLUCOSE 134*   < > 159* 151* 122*  BUN 45*   < > 45* 47* 56*  CREATININE 2.87*   < > 2.01* 1.96* 2.04*  CALCIUM 8.9   < > 8.7* 8.9 8.5*  PROT 5.7*  --   --   --   --   ALBUMIN 2.8*  --   --   --   --   AST  18  --   --   --   --   ALT 15  --   --   --   --   ALKPHOS 110  --   --   --   --   BILITOT 1.1  --   --   --   --   GFRNONAA 22*   < > 33* 34* 33*  GFRAA 25*   < > 39* 40* 38*  ANIONGAP 10   < > 10 9 15    < > = values in this interval not displayed.     Hematology Recent Labs  Lab 09/20/19 0533 09/20/19 1202 09/21/19 0646 09/22/19 0502 09/23/19 0601  WBC 8.7  --   --  9.6 10.7*  RBC 3.15*  --   --  3.28* 3.25*  HGB 9.0*   < > 9.6* 9.5* 9.4*  HCT 28.4*   < > 30.9* 30.5* 30.5*  MCV 90.2  --   --  93.0 93.8  MCH 28.6  --   --  29.0 28.9  MCHC 31.7  --   --  31.1 30.8  RDW 20.7*  --   --  21.2* 21.7*  PLT 124*  --   --  131* 149*   < > = values in this interval not displayed.    BNPNo results for input(s): BNP, PROBNP in the last 168 hours.   DDimer No results for input(s): DDIMER in the last 168 hours.   Radiology    DG Chest 2 View Result Date: 09/23/2019 CLINICAL DATA:  69 year old male status post pacemaker insertion. EXAM: CHEST - 2 VIEW  COMPARISON:  Chest x-ray 09/18/2019. FINDINGS: Lung volumes are normal. No consolidative airspace disease. Small bilateral pleural effusions. There is cephalization of the pulmonary vasculature and slight indistinctness of the interstitial markings suggestive of mild pulmonary edema. No pneumothorax. Bibasilar opacities favored to reflect areas of subsegmental atelectasis. Mild cardiomegaly. Aortic atherosclerosis. New left-sided biventricular pacemaker with lead tips projecting over the expected location of the right atrium, right ventricle and overlying the left ventricle via the coronary sinus and coronary veins. IMPRESSION: 1. New left-sided biventricular pacemaker appears well positioned with no pneumothorax or other acute complicating features. 2. The appearance of the chest suggest congestive heart failure, as above. 3. Aortic atherosclerosis. Electronically Signed   By: Vinnie Langton M.D.   On: 09/23/2019 07:23    Cardiac Studies   Echo 09/17/19 1. Left ventricular ejection fraction, by estimation, is 40 to 45%. The  left ventricle has mildly decreased function. The left ventricle  demonstrates global hypokinesis. The left ventricular internal cavity size  was mildly dilated. Left ventricular  diastolic parameters are consistent with Grade II diastolic dysfunction  (pseudonormalization). Elevated left atrial pressure.   2. Right ventricular systolic function is mildly reduced. The right  ventricular size is mildly enlarged. There is moderately elevated  pulmonary artery systolic pressure.   3. Left atrial size was mildly dilated.   4. Right atrial size was mildly dilated.   5. The mitral valve is normal in structure. Mild mitral valve  regurgitation.   6. The aortic valve is tricuspid. Aortic valve regurgitation is mild.  Severe aortic valve stenosis.   7. The inferior vena cava is dilated in size with <50% respiratory  variability, suggesting right atrial pressure of 15 mmHg.     ECHO: 06/20/2019 at [place]   1. Doppler indices suggest severe low-flow, low-gradient aortic valve stenosis.   2. There is  mild aortic regurgitation.   3. The left ventricle is mildly dilated in size with normal wall thickness.   4. The left ventricular systolic function is moderately decreased, LVEF is visually estimated at 40%.   5. The mitral valve leaflets are mildly thickened with normal leaflet mobility.   6. There is moderate mitral valve regurgitation.   7. The left atrium is moderately dilated in size.   8. The right ventricle is mildly dilated in size, with mildly reduced systolic function.   9. The right atrium is moderately dilated  in size.     CT cardiac TAVR: 09/09/2019 Vascular access measurements: Left common iliac artery: 10 x 7 mm Left external iliac artery: 9 x 7 cm Right common iliac artery: 10 x 7 mm Right external iliac artery: 8 x 6 mm.  1. Aortic annulus diameter is 27 mm. There is dense calcification of the aortic valve with an Agatston score of 839. 2. Three-vessel coronary artery atherosclerosis. 3. Moderate biatrial dilation. 4. Please see concurrent CTA chest abdomen pelvis for more accurate evaluation of noncardiac findings.   CATH: At [facility], 08/10/2018 Left Heart Catheterization (Left Radial) Under Lidocaine 2% local anesthesia, a 35F Terumo Glidesheath was placed in  the left radial artery using modified Seldinger technique. 5 mg of  verapamil were given via the sheath intra-arterially. A 0.035 Rosen wire  (260cm) was used to guide the advancement of the coronary catheter for  engagement. 4,000 units of heparin were given after the wire and catheter  crossed the aortic arch into the ascending aorta. Selective coronary  angiography was then performed in multiple views by hand injections of  Omnipaque using a 35F JR4 catheter to engage the right coronary artery and  a 35F JL4 catheter to engage the left coronary artery.  The culprit  lesion was identified percutaneous coronary intervention  followed as described below.  Percutaneous Coronary Intervention The decision was made to intervene on the RCA.  PCI: Percutaneous coronary intervention performed of the mRCA  Anticoagulants:  Routine, ASA, Clopidogrel, Unfractionated Heparin Guide catheter: JR4  Guide Wire: 0.014" [place] Pre-Dilation Balloon: 2.5 mm  Stent:   Synergy 3.5x16 mm DES Post-Dilation Balloon:  N/a Result:   Excellent angiographic result with TIMI 3 flow  Following the procedure, the sheath was removed, and a TR band was applied  to achieve hemostasis. The TR band was subsequently removed with staged  removal of air as per protocol. The patient tolerated the procedure well  without any complications.  IVUS An [place] IVUS catheter was advanced down the wire and used to  visualize the leftand right CIA,EIA, and CFA. There was mild plaquing  throughout the inflow vessel. No significant stenoses noted with CFA  around 7.5-8.27mm  FINDINGS  Hemodynamics and Left Heart Catheterization  Aortic pressure: 105/70 mm Hg (mean 86 mm Hg)  Coronary Angiography  Dominance: Right  Left Main: The left main coronary artery (LMCA) is a large-caliber vessel  that originates from the left coronary sinus. It bifurcates into the left  anterior descending (LAD) and left circumflex (LCx) arteries. There is no  angiographic evidence of significant disease in the LMCA.  LAD: The LAD is a large-caliber vessel that gives off 2 diagonal (D)  branches before it wraps around the apex. High D1 is a large-caliber,  branching vessel. D2 is a moderate-caliber vessel with a 90% stenosis in  the proximal segment. There is no other angiographic evidence of  significant disease in the LAD.  Left Circumflex: The LCx is a large-caliber vessel that gives off 3 obtuse  marginal (OM) branches and then continues as a small vessel in the AV  groove. OM1 is a large-caliber  vessel. OM2 is a moderate-caliber vessel.  OM3 is a large-caliber vessel. There is no angiographic evidence of  significant disease in the LCx.  Right Coronary: The right coronary artery (RCA) is a large-caliber vessel  originating from the right coronary sinus. It bifurcates distally into a  large-caliber posterior descending artery (PDA) and small posterolateral  (PL) branches consistent with a right dominant system. There is a 90%  stenosis of the mRCA.      Patient Profile     69 y.o. male with a hx of COPD, OSA w/CPAP, HTN, DM, CKD (III), AFib/flutter, chronic CHF (systolic), VHD w/ mod MR and severe AS, CAD  Outpatient recent evaluation for TAVR at [facility] (was planned for today initially) though delayed given hs admission here.  Admitted to [facility] with recurrent syncope. Siffered L knee trauma with very large laceration associated with significant bleeding s/p KCentra and vitamin K and PRBC, required ortho to close wound  Found bradycardic 30's-50's home BB and amiodarone stopped with improvement in HR >> eventually unfortunately  AFib/flutter w/RVR He has been diuresed as well for volume OL  Assessment & Plan    1. Syncope 2. Paroxysmal AFib/flutter      CHA2DS2Vasc is 5, Eliquis held post knee trauma 3. Tachy-brady      S/p CRT-P  Implant yesterday Pocket-pal was in place, though had shifted superiorly, pulling his dressing almost off with it steristrips were re-enforced Ecchymosis, though no hematoma CXR without ptx this AM Device check with stable measurements   Discussed with cardiology team, since has been > 30 days since his PCI, once Eliquis resumed, would be OK to drop ASA in to avoid triple therapy with fresh pacer pocket  His amio was not continued last night, seems not ordered.  I have discussed the case with Dr. Caryl Comes this AM, who had started a POC for him Plan oral amiodarone today to adjunct rate control, increased his metoprolol dose. He had  high venous pressures during his procedure, and added lasix.  Discussed pocket findings this AM No Eliquis until Monday please  Post PPM follow up will be arranged for the patient     4. Acute/chronic CHF     Cumulatively neg 3865ml, feeling better     Continue with general cardiology team     Lasix ordered for today     Creat at his baseline  Will defer further management to cardiology team   5. CAD     PCI last month     Back on his ASA/Plavix     No CP         For questions or updates, please contact [place] Please consult [email or web address] for contact info under        Signed, Baldwin Jamaica, PA-C  09/23/2019, 8:33 AM     I have seen, examined the patient, and reviewed the above assessment and plan.  Changes to above are made where necessary.  On exam, iRRR.  CXR reveals stable leads, no ptx.  Device interrogation is personally reviewed and normal  General cardiology to manage CHF As per Dr Caryl Comes, eliquis is on hold until Monday.  Co Sign: Thompson Grayer, MD 09/23/2019 11:01 AM

## 2019-09-24 DIAGNOSIS — I5043 Acute on chronic combined systolic (congestive) and diastolic (congestive) heart failure: Secondary | ICD-10-CM | POA: Diagnosis not present

## 2019-09-24 DIAGNOSIS — I4892 Unspecified atrial flutter: Secondary | ICD-10-CM

## 2019-09-24 DIAGNOSIS — I350 Nonrheumatic aortic (valve) stenosis: Secondary | ICD-10-CM | POA: Diagnosis not present

## 2019-09-24 LAB — CBC
HCT: 30.4 % — ABNORMAL LOW (ref 39.0–52.0)
Hemoglobin: 9.5 g/dL — ABNORMAL LOW (ref 13.0–17.0)
MCH: 28.8 pg (ref 26.0–34.0)
MCHC: 31.3 g/dL (ref 30.0–36.0)
MCV: 92.1 fL (ref 80.0–100.0)
Platelets: 137 10*3/uL — ABNORMAL LOW (ref 150–400)
RBC: 3.3 MIL/uL — ABNORMAL LOW (ref 4.22–5.81)
RDW: 21.6 % — ABNORMAL HIGH (ref 11.5–15.5)
WBC: 8.5 10*3/uL (ref 4.0–10.5)
nRBC: 0 % (ref 0.0–0.2)

## 2019-09-24 LAB — PROTIME-INR
INR: 1.2 (ref 0.8–1.2)
Prothrombin Time: 15.4 seconds — ABNORMAL HIGH (ref 11.4–15.2)

## 2019-09-24 LAB — BASIC METABOLIC PANEL
Anion gap: 11 (ref 5–15)
BUN: 59 mg/dL — ABNORMAL HIGH (ref 8–23)
CO2: 29 mmol/L (ref 22–32)
Calcium: 8.5 mg/dL — ABNORMAL LOW (ref 8.9–10.3)
Chloride: 102 mmol/L (ref 98–111)
Creatinine, Ser: 2.05 mg/dL — ABNORMAL HIGH (ref 0.61–1.24)
GFR calc Af Amer: 38 mL/min — ABNORMAL LOW (ref >60)
GFR calc non Af Amer: 33 mL/min — ABNORMAL LOW (ref >60)
Glucose, Bld: 113 mg/dL — ABNORMAL HIGH (ref 70–99)
Potassium: 3.8 mmol/L (ref 3.5–5.1)
Sodium: 142 mmol/L (ref 135–145)

## 2019-09-24 LAB — GLUCOSE, CAPILLARY
Glucose-Capillary: 97 mg/dL (ref 70–99)
Glucose-Capillary: 139 mg/dL — ABNORMAL HIGH (ref 70–99)
Glucose-Capillary: 113 mg/dL — ABNORMAL HIGH (ref 70–99)
Glucose-Capillary: 141 mg/dL — ABNORMAL HIGH (ref 70–99)

## 2019-09-24 LAB — MAGNESIUM: Magnesium: 2.3 mg/dL (ref 1.7–2.4)

## 2019-09-24 MED ORDER — WARFARIN SODIUM 2 MG PO TABS: 4.0000 mg | ORAL_TABLET | Freq: Once | ORAL | Status: DC

## 2019-09-24 MED ORDER — AMIODARONE HCL 200 MG PO TABS
400.0000 mg | ORAL_TABLET | Freq: Three times a day (TID) | ORAL | Status: DC
  Administered 2019-09-24 – 2019-09-27 (×10): 400 mg via ORAL
  Filled 2019-09-24 (×10): qty 2

## 2019-09-24 MED ORDER — WARFARIN SODIUM 3 MG PO TABS
3.0000 mg | ORAL_TABLET | Freq: Once | ORAL | Status: AC
  Administered 2019-09-24: 3 mg via ORAL
  Filled 2019-09-24: qty 1

## 2019-09-24 MED ORDER — WARFARIN - PHARMACIST DOSING INPATIENT: Freq: Every day | Status: DC

## 2019-09-24 MED ORDER — PREDNISONE 10 MG PO TABS
10.0000 mg | ORAL_TABLET | Freq: Every day | ORAL | Status: DC
  Administered 2019-09-25: 10 mg via ORAL
  Filled 2019-09-24: qty 1

## 2019-09-24 MED ORDER — ZOLPIDEM TARTRATE 5 MG PO TABS
5.0000 mg | ORAL_TABLET | Freq: Every evening | ORAL | Status: AC | PRN
  Administered 2019-09-24: 5 mg via ORAL
  Filled 2019-09-24: qty 1

## 2019-09-24 MED ORDER — DULOXETINE HCL 30 MG PO CPEP
30.0000 mg | ORAL_CAPSULE | Freq: Every day | ORAL | Status: DC
  Administered 2019-09-25 – 2019-09-26 (×2): 30 mg via ORAL
  Filled 2019-09-24 (×2): qty 1

## 2019-09-24 NOTE — Progress Notes (Signed)
Rehab Admissions Coordinator Note:  Per PT recommendation, this patient was screened by Raechel Ache for appropriateness for an Inpatient Acute Rehab Consult.  At this time, we are recommending an Inpatient Rehab consult. AC will place order per protocol.   Raechel Ache 09/24/2019, 2:18 PM  I can be reached at [phone].

## 2019-09-24 NOTE — Progress Notes (Signed)
Occupational Therapy Treatment Patient Details Name: Justin Boone MRN: [id number] DOB: November 20, 1950 Today's Date: 09/24/2019    History of present illness Pt is a 69 y/o male admitted following fall secondary to symptomatic bradycardia. Pt sustained L knee laceration that required sutures. PMH includes COPD, a fib, CKD, CHF, CAD s/p stent placement, OSA on CPAP, and tobacco use. Pacemaker placed 4/14.    OT comments  Pt progressing toward stated goals, focused session on functional mobility progression and BADL independence. Pt completed bed mobility with min-mod A +2 and sit <> stands with min A +2. KI donned for comfort to maintain extension in L knee. Pt able to complete functional mobility to the sink with min A +2 to complete 2 grooming tasks. Pt also washed peri area in standing with min A for balance. Pt is largely limited from L shoulder pain post pacemaker placement. Pt is at 10/10 pain in shoulder with limited touch. Updated recs to CIR for higher intensity therapies to promote independent baseline prior to d/c home. Will continue to follow.   Follow Up Recommendations  CIR    Equipment Recommendations  3 in 1 bedside commode;Wheelchair (measurements OT);Wheelchair cushion (measurements OT)    Recommendations for Other Services Rehab consult    Precautions / Restrictions Precautions Precautions: Fall;ICD/Pacemaker Precaution Comments: Must keep knee extended; no flexion  Required Braces or Orthoses: Knee Immobilizer - Left Knee Immobilizer - Left: Other (comment)(for comfort to keep leg extended) Restrictions Weight Bearing Restrictions: Yes LUE Weight Bearing: Partial weight bearing LUE Partial Weight Bearing Percentage or Pounds: no pulling due to pacemake placement LLE Weight Bearing: Weight bearing as tolerated       Mobility Bed Mobility Overal bed mobility: Needs Assistance Bed Mobility: Supine to Sit     Supine to sit: Min assist;Mod assist;+2 for physical  assistance     General bed mobility comments: min A initially for pt to pull against therapist with R UE, requires modAx2 for scooting hips to EoB  Transfers Overall transfer level: Needs assistance Equipment used: 2 person hand held assist Transfers: Sit to/from Stand Sit to Stand: Min assist;+2 physical assistance         General transfer comment: musketeer assist using gait belt on R side and therapist on L side supporting L UE and assist at R hip behind back     Balance Overall balance assessment: Needs assistance Sitting-balance support: No upper extremity supported;Feet supported Sitting balance-Leahy Scale: Fair     Standing balance support: Bilateral upper extremity supported;Single extremity supported;During functional activity Standing balance-Leahy Scale: Poor Standing balance comment: Single UE required for balance; pt with x1 LOB at sink requiring minA for steadying                            ADL either performed or assessed with clinical judgement   ADL Overall ADL's : Needs assistance/impaired     Grooming: Minimal assistance;Standing Grooming Details (indicate cue type and reason): min A for steadying support at sink to compelte 2 BADL tasks. Frequent cues to pace self and breathe to manage pain                 Toilet Transfer: Minimal assistance;+2 for physical assistance;+2 for safety/equipment;Stand-pivot   Toileting- Clothing Manipulation and Hygiene: Min guard;Sit to/from stand Toileting - Clothing Manipulation Details (indicate cue type and reason): stnading at sink to clean peri area     Functional mobility during ADLs: Minimal assistance;+2 for  safety/equipment       Vision Patient Visual Report: No change from baseline     Perception     Praxis      Cognition Arousal/Alertness: Awake/alert Behavior During Therapy: WFL for tasks assessed/performed Overall Cognitive Status: Within Functional Limits for tasks assessed                                           Exercises     Shoulder Instructions       General Comments SaO2 on RA 96%O2    Pertinent Vitals/ Pain       Pain Assessment: Faces Faces Pain Scale: Hurts worst Pain Location: L shoulder with movemement Pain Descriptors / Indicators: Grimacing;Guarding;Aching Pain Intervention(s): Limited activity within patient's tolerance;Monitored during session;Repositioned  Home Living                                          Prior Functioning/Environment              Frequency  Min 2X/week        Progress Toward Goals  OT Goals(current goals can now be found in the care plan section)  Progress towards OT goals: Progressing toward goals  Acute Rehab OT Goals Patient Stated Goal: to decrease pain  OT Goal Formulation: With patient Time For Goal Achievement: 10/04/19 Potential to Achieve Goals: Good  Plan Discharge plan needs to be updated    Co-evaluation    PT/OT/SLP Co-Evaluation/Treatment: Yes Reason for Co-Treatment: For patient/therapist safety;To address functional/ADL transfers   OT goals addressed during session: ADL's and self-care;Strengthening/ROM      AM-PAC OT "6 Clicks" Daily Activity     Outcome Measure   Help from another person eating meals?: None Help from another person taking care of personal grooming?: A Little Help from another person toileting, which includes using toliet, bedpan, or urinal?: A Little Help from another person bathing (including washing, rinsing, drying)?: A Lot Help from another person to put on and taking off regular upper body clothing?: A Little Help from another person to put on and taking off regular lower body clothing?: A Lot 6 Click Score: 17    End of Session Equipment Utilized During Treatment: Gait belt;Rolling walker  OT Visit Diagnosis: Unsteadiness on feet (R26.81);Pain Pain - Right/Left: Left Pain - part of body: Shoulder    Activity Tolerance Patient tolerated treatment well   Patient Left in chair;with call bell/phone within reach;with chair alarm set   Nurse Communication Mobility status        Time: 1001-1044 OT Time Calculation (min): 43 min  Charges: OT General Charges $OT Visit: 1 Visit OT Treatments $Self Care/Home Management : 23-37 mins  Zenovia Jarred, MSOT, OTR/L [facility] Office Number: [phone] Pager: [phone]  Zenovia Jarred 09/24/2019, 2:27 PM

## 2019-09-24 NOTE — Progress Notes (Signed)
ANTICOAGULATION CONSULT NOTE - Initial Consult  Pharmacy Consult for coumadin Indication: atrial fibrillation  Allergies  Allergen Reactions  . Contrast Media [Iodinated Diagnostic Agents]   . Sulfa Antibiotics     Patient Measurements: Height: 6' (182.9 cm) Weight: 98.4 kg (217 lb) IBW/kg (Calculated) : 77.6 Heparin Dosing Weight:   Vital Signs: Temp: 98.8 F (37.1 C) (04/16 1100) Temp Source: Oral (04/16 1100) BP: 108/87 (04/16 1407) Pulse Rate: 112 (04/16 0821)  Labs: Recent Labs    09/22/19 0502 09/22/19 0502 09/23/19 0601 09/24/19 0421  HGB 9.5*   < > 9.4* 9.5*  HCT 30.5*  --  30.5* 30.4*  PLT 131*  --  149* 137*  CREATININE 1.96*  --  2.04* 2.05*   < > = values in this interval not displayed.    Estimated Creatinine Clearance: 42.5 mL/min (A) (by C-G formula based on SCr of 2.05 mg/dL (H)).   Medical History: Past Medical History:  Diagnosis Date  . A-fib ([place])   . Bradycardia 09/17/2019  . CHF (congestive heart failure) ([place])   . Diabetes mellitus type II, controlled ([place])   . HTN (hypertension)     Medications:  Medications Prior to Admission  Medication Sig Dispense Refill Last Dose  . albuterol (PROVENTIL) (2.5 MG/3ML) 0.083% nebulizer solution Inhale 3 mLs into the lungs every 6 (six) hours as needed for shortness of breath or wheezing.   unk  . allopurinol (ZYLOPRIM) 300 MG tablet Take 300 mg by mouth daily.   09/16/2019 at Unknown time  . amiodarone (PACERONE) 200 MG tablet Take 200 mg by mouth daily.   09/16/2019 at Unknown time  . atorvastatin (LIPITOR) 80 MG tablet Take 80 mg by mouth daily.   09/16/2019 at Unknown time  . clopidogrel (PLAVIX) 75 MG tablet Take 75 mg by mouth daily.   09/16/2019 at 0900  . DULoxetine (CYMBALTA) 60 MG capsule Take 60 mg by mouth daily.   09/16/2019 at Unknown time  . ELIQUIS 5 MG TABS tablet Take 5 mg by mouth 2 (two) times daily.   09/16/2019 at 0900  . metoprolol succinate (TOPROL-XL) 50 MG 24 hr tablet Take 50 mg by  mouth daily.   09/16/2019 at 0900  . tamsulosin (FLOMAX) 0.4 MG CAPS capsule Take 0.4 mg by mouth daily.   09/16/2019 at Unknown time  . torsemide (DEMADEX) 20 MG tablet Take 20 mg by mouth daily.   09/16/2019 at Unknown time  . TRELEGY ELLIPTA 100-62.5-25 MCG/INH AEPB Inhale 1 puff into the lungs daily.   09/16/2019 at Unknown time   Scheduled:  . amiodarone  400 mg Oral Q8H  . aspirin  81 mg Oral Daily  . budesonide (PULMICORT) nebulizer solution  0.25 mg Nebulization BID  . cephALEXin  500 mg Oral Q8H  . clopidogrel  75 mg Oral Daily  . [START ON 09/25/2019] DULoxetine  30 mg Oral Daily  . feeding supplement (GLUCERNA SHAKE)  237 mL Oral TID BM  . furosemide  80 mg Intravenous BID  . insulin aspart  0-5 Units Subcutaneous QHS  . insulin aspart  0-9 Units Subcutaneous TID WC  . ipratropium-albuterol  3 mL Nebulization BID  . lidocaine-EPINEPHrine  20 mL Intradermal Once  . metoprolol tartrate  25 mg Oral Q8H  . multivitamin with minerals  1 tablet Oral Daily  . [START ON 09/25/2019] predniSONE  10 mg Oral Q breakfast  . tamsulosin  0.4 mg Oral QPC breakfast  . warfarin  4 mg Oral ONCE-1600  .  Warfarin - Pharmacist Dosing Inpatient   Does not apply q1600   Infusions:    Assessment: Pt was admitted after fall and knee laceration. He got a pacer implanted on 4/14. He was on Eliquis PTA. The INR was elevated on admission at 2.4 due to apixaban. He also got 10mg  of vit K during that time. He has had some bleeding from the pocket site so coumadin will be used for now instead of the NOAC. A plan TEE with DCCV next week. We are asked to dose coumadin and let INR trend up non-aggressively.   We will get an INR today. He is also on amiodarone so we will use a lower dose. He weighs 98kg  Goal of Therapy:  INR 2-3 Monitor platelets by anticoagulation protocol: Yes   Plan:  Coumadin 3mg  PO x1 Baseline then daily INR  Onnie Boer, PharmD, BCIDP, AAHIVP, CPP Infectious Disease Pharmacist 09/24/2019  2:31 PM

## 2019-09-24 NOTE — Progress Notes (Addendum)
PROGRESS NOTE  Kendon Sedeno CNO:[id number] DOB: 1951/01/28 DOA: 09/16/2019 PCP: Patient, No Pcp Per  Brief Narrative:  69 year old gentleman prior history of atrial fibrillation on Eliquis, diabetes mellitus, hypertension, COPD, obstructive sleep apnea on CPAP, ongoing tobacco use stage IIIb CKD, atrial fibrillation/flutter s/p cardioversion on amiodarone and Eliquis, chronic systolic heart failure, recent left heart catheterization in the setting of TAVR work-up on 08/12/2019, s/p drug-eluting stent to the LAD on Plavix and Eliquis presents to ED after a fall and left knee laceration.  X-rays of the left knee shows mild to moderate amount of infrapatellar soft tissue swelling and soft tissue air without evidence of acute abnormality.  Due to persistent bleeding orthopedics consulted and suggested to continue with compression wrapping and no indication of urgent washout of the knee in the OR.  Patient started bleeding profusely and persistently from the laceration soaking his dressing, bandages in the bed.  Patient's hemoglobin has dropped from 9.9-7.8 the last 24 hours from profuse bleeding from the left knee laceration.  Discussed with pharmacy regarding Andexxa suggested that this is not a life-threatening bleeding and his last Eliquis dose was on the morning of 09/16/2019, he is out of the window and has been 24 hours since her last Eliquis dose.  He was given  K Centra and vitamin K respiratory anticoagulation antidote protocol. His bleeding has stopped since then and Orthopedics recommended follow-up for left knee laceration evaluation and dressing change in 1 to 2 weeks. Called his cardiology office at [facility], who notified me that they are not going ahead with TAVR at this time as his aortic stenosis is only moderate. 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 Therapy evaluations recommending SNF. TOC on board for placement.     HPI/Recap of past 24 hours:  He  is status post pacemaker placement on 4/15, has some issues with pocksite bleeding with a pressure dressing on He is sitting up in chair, denies pain, no sob, lower extremity edema is improving sleepy this morning,  denies chest pain Remain tachycardia, heart rate in 110s to 120s  Assessment/Plan: Principal Problem:   Symptomatic bradycardia Active Problems:   Paroxysmal atrial fibrillation (HCC)   QT prolongation   COPD with acute exacerbation (HCC)   AKI (acute kidney injury) ([place])   Knee laceration, left, initial encounter   Chronic systolic heart failure (HCC)   Severe aortic stenosis   CAD (coronary artery disease)   S/P primary angioplasty with coronary stent   Left knee laceration with profuse and persistent bleeding/acute blood loss anemia on admission  After syncope episode  Bleeding has been controlled with compression bandages which have been changed again by orthopedics.  1 dose of Kcentra given on admission and bleeding stopped.  Orthopedics recommends weightbearing as tolerated on the left lower extremity and to keep the left knee extended and straight for 2 weeks and follow-up with orthopedics in 1 to 2 weeks for laceration evaluation and dressing changes.  Okay to use knee immobilizer for comfort/compliance of knee extension.  We will transition IV antibiotics to oral Keflex on discharge to complete the 10-day course,  elevate extremity and encourage ankle flexion and extension to facilitate movement.  paf with tachy/brady syndrome/syncope Cardiology/ep input appreciated, s/p pacemaker placement on April 14, started on amiodarone Issues with  pocket site bleeding Per cardiology, plan to start coumadin and for possible cardioversion next week Will follow cardiology recommendation   Chronic systolic heart failure Echocardiogram showed LVEF of  40 to 45%, with mildly decreased function. The left ventricle shows global hypokinesis. Left ventricular diastolic parameters are  consistent with grade 2 diastolic dysfunction.  Negative 5liters, on iv lasix  strict intake and output and daily weights, still with signs of volume overload Will follow cardiology recommendation   AKI on stage III CKD Cr 3 on presentaion,  Ultrasound of the kidney did not show any hydronephrosis or obstruction. Creatinine around 2 this am and stable.   Diet controlled dm2 a1c 6 A.m. blood glucose 122  COPD Tobacco abuse Counseled against tobacco use Wheezing has resolved, transitioned to oral prednisone and plan to taper to off.  Weaned off oxygen.   Obstructive sleep apnea on CPAP at night continue the same.  FTT: PT recommend snf placement then cir  DVT Prophylaxis: resume eliquis when ok with cardiology  Code Status: full  Family Communication: patient   Disposition Plan:    Patient came from:         home                                                                                                 Anticipated d/c place:  cir vs home with home health Barriers to d/c OR conditions which need to be met to effect a safe  Tachycardia and volume overload, on IV Lasix, needs cardioversion   Consultants:  Cardiology/EP  Ortho  Critical care  Wound care  Procedures:  prbc transfusion  Pacemaker placement  Antibiotics:  Rocephin then Keflex   Objective: BP 103/80 (BP Location: Right Arm)   Pulse (!) 112   Temp 98.8 F (37.1 C) (Oral)   Resp 18   Ht 6' (1.829 m)   Wt 98.4 kg   SpO2 96%   BMI 29.43 kg/m   Intake/Output Summary (Last 24 hours) at 09/24/2019 1408 Last data filed at 09/24/2019 1300 Gross per 24 hour  Intake 940 ml  Output 2535 ml  Net -1595 ml   Filed Weights   09/22/19 0400 09/23/19 0021 09/24/19 0505  Weight: 97.1 kg 99.8 kg 98.4 kg    Exam: Patient is examined daily including today on 09/24/2019, exams remain the same as of yesterday except that has changed    General:  NAD  Cardiovascular: IRRR,   tachycardia  Respiratory: CTABL  Abdomen: Soft/ND/NT, positive BS  Musculoskeletal: left knee in immobolizer, lower extremity edema  Neuro: alert, oriented   Data Reviewed: Basic Metabolic Panel: Recent Labs  Lab 09/20/19 0533 09/21/19 0646 09/22/19 0502 09/23/19 0601 09/24/19 0421  NA 143 139 143 142 142  K 4.7 4.3 4.7 4.8 3.8  CL 104 100 104 104 102  CO2 _0 GLUCOSE 153* 159* 151* 122* 113*  BUN 46* 45* 47* 56* 59*  CREATININE 2.33* 2.01* 1.96* 2.04* 2.05*  CALCIUM 8.7* 8.7* 8.9 8.5* 8.5*  MG  --  2.3  --   --  2.3   Liver Function Tests: No results for input(s): AST, ALT, ALKPHOS, BILITOT, PROT, ALBUMIN in the last 168 hours. No results for input(s): LIPASE, AMYLASE in the  last 168 hours. No results for input(s): AMMONIA in the last 168 hours. CBC: Recent Labs  Lab 09/19/19 1223 09/19/19 1814 09/20/19 0533 09/20/19 1202 09/21/19 0117 09/21/19 0646 09/22/19 0502 09/23/19 0601 09/24/19 0421  WBC 9.4  --  8.7  --   --   --  9.6 10.7* 8.5  NEUTROABS 8.1*  --   --   --   --   --   --   --   --   HGB 9.1*   < > 9.0*   < > 10.0* 9.6* 9.5* 9.4* 9.5*  HCT 28.8*   < > 28.4*   < > 31.3* 30.9* 30.5* 30.5* 30.4*  MCV 90.3  --  90.2  --   --   --  93.0 93.8 92.1  PLT 97*  --  124*  --   --   --  131* 149* 137*   < > = values in this interval not displayed.   Cardiac Enzymes:   No results for input(s): CKTOTAL, CKMB, CKMBINDEX, TROPONINI in the last 168 hours. BNP (last 3 results) No results for input(s): BNP in the last 8760 hours.  ProBNP (last 3 results) No results for input(s): PROBNP in the last 8760 hours.  CBG: Recent Labs  Lab 09/23/19 1059 09/23/19 1636 09/23/19 2119 09/24/19 0540 09/24/19 1108  GLUCAP 148* 143* 95 97 139*    Recent Results (from the past 240 hour(s))  Respiratory Panel by RT PCR (Flu A&B, Covid) - Nasopharyngeal Swab     Status: None   Collection Time: 09/16/19  8:24 PM   Specimen: Nasopharyngeal Swab  Result Value  Ref Range Status   SARS Coronavirus 2 by RT PCR NEGATIVE NEGATIVE Final    Comment: (NOTE) SARS-CoV-2 target nucleic acids are NOT DETECTED. The SARS-CoV-2 RNA is generally detectable in upper respiratoy specimens during the acute phase of infection. The lowest concentration of SARS-CoV-2 viral copies this assay can detect is 131 copies/mL. A negative result does not preclude SARS-Cov-2 infection and should not be used as the sole basis for treatment or other patient management decisions. A negative result may occur with  improper specimen collection/handling, submission of specimen other than nasopharyngeal swab, presence of viral mutation(s) within the areas targeted by this assay, and inadequate number of viral copies (<131 copies/mL). A negative result must be combined with clinical observations, patient history, and epidemiological information. The expected result is Negative. Fact Sheet for Patients:  PinkCheek.be Fact Sheet for Healthcare Providers:  GravelBags.it This test is not yet ap proved or cleared by the [place] FDA and  has been authorized for detection and/or diagnosis of SARS-CoV-2 by FDA under an Emergency Use Authorization (EUA). This EUA will remain  in effect (meaning this test can be used) for the duration of the COVID-19 declaration under Section 564(b)(1) of the Act, 21 U.S.C. section 360bbb-3(b)(1), unless the authorization is terminated or revoked sooner.    Influenza A by PCR NEGATIVE NEGATIVE Final   Influenza B by PCR NEGATIVE NEGATIVE Final    Comment: (NOTE) The Xpert Xpress SARS-CoV-2/FLU/RSV assay is intended as an aid in  the diagnosis of influenza from Nasopharyngeal swab specimens and  should not be used as a sole basis for treatment. Nasal washings and  aspirates are unacceptable for Xpert Xpress SARS-CoV-2/FLU/RSV  testing. Fact Sheet for  Patients: PinkCheek.be Fact Sheet for Healthcare Providers: GravelBags.it This test is not yet approved or cleared by the [place] and  has been authorized for  detection and/or diagnosis of SARS-CoV-2 by  FDA under an Emergency Use Authorization (EUA). This EUA will remain  in effect (meaning this test can be used) for the duration of the  Covid-19 declaration under Section 564(b)(1) of the Act, 21  U.S.C. section 360bbb-3(b)(1), unless the authorization is  terminated or revoked. Performed at [facility], [place] [address] [place], [place] [address]   Surgical PCR screen     Status: None   Collection Time: 09/21/19 10:17 PM   Specimen: Nasal Mucosa; Nasal Swab  Result Value Ref Range Status   MRSA, PCR NEGATIVE NEGATIVE Final   Staphylococcus aureus NEGATIVE NEGATIVE Final    Comment: (NOTE) The Xpert SA Assay (FDA approved for NASAL specimens in patients 33 years of age and older), is one component of a comprehensive surveillance program. It is not intended to diagnose infection nor to guide or monitor treatment. Performed at [facility], [place] [address] [place], [place] [address]      Studies: No results found.  Scheduled Meds: . amiodarone  400 mg Oral Q8H  . aspirin  81 mg Oral Daily  . budesonide (PULMICORT) nebulizer solution  0.25 mg Nebulization BID  . cephALEXin  500 mg Oral Q8H  . clopidogrel  75 mg Oral Daily  . [START ON 09/25/2019] DULoxetine  30 mg Oral Daily  . feeding supplement (GLUCERNA SHAKE)  237 mL Oral TID BM  . furosemide  80 mg Intravenous BID  . insulin aspart  0-5 Units Subcutaneous QHS  . insulin aspart  0-9 Units Subcutaneous TID WC  . ipratropium-albuterol  3 mL Nebulization BID  . lidocaine-EPINEPHrine  20 mL Intradermal Once  . metoprolol tartrate  25 mg Oral Q8H  . multivitamin with minerals  1 tablet Oral Daily  . predniSONE  20 mg Oral Q breakfast  .  tamsulosin  0.4 mg Oral QPC breakfast    Continuous Infusions:    Time spent: 64mns I have personally reviewed and interpreted on  09/24/2019 daily labs, tele strips, imagings as discussed above under date review session and assessment and plans.  I reviewed all nursing notes, pharmacy notes, consultant notes,  vitals, pertinent old records  I have discussed plan of care as described above with RN , patient  on 09/24/2019   FFlorencia ReasonsMD, PhD, FACP  [facility]  Available via Epic secure chat 7am-7pm for nonurgent issues Please page for urgent issues, pager number available through [email or web address] .   09/24/2019, 2:08 PM  LOS: 8 days

## 2019-09-24 NOTE — Progress Notes (Signed)
MD, pt's incision site was bleeding, RN applied pressure for 5 minutes and applied a pressure dressing to the site, on call MD notified, wlll continue to monitor, Thanks Arvella Nigh RN

## 2019-09-24 NOTE — Plan of Care (Signed)
  Problem: Activity: Goal: Ability to return to baseline activity level will improve Outcome: Progressing   Problem: Education: Goal: Knowledge of disease or condition will improve Outcome: Progressing   Problem: Education: Goal: Knowledge of the prescribed therapeutic regimen will improve Outcome: Progressing   Problem: Health Behavior/Discharge Planning: Goal: Ability to safely manage health-related needs after discharge will improve Outcome: Progressing   Problem: Activity: Goal: Ability to tolerate increased activity will improve Outcome: Progressing   Problem: Activity: Goal: Will verbalize the importance of balancing activity with adequate rest periods Outcome: Progressing

## 2019-09-24 NOTE — Progress Notes (Signed)
Patient has increased HR- 115. Not unusual for him. VS were every 2hrs, now every 4hrs since mews remains yellow.

## 2019-09-24 NOTE — Plan of Care (Signed)
  Problem: Education: Goal: Understanding of CV disease, CV risk reduction, and recovery process will improve Outcome: Progressing   Problem: Cardiovascular: Goal: Ability to achieve and maintain adequate cardiovascular perfusion will improve Outcome: Progressing   Problem: Activity: Goal: Ability to tolerate increased activity will improve Outcome: Progressing   Problem: Respiratory: Goal: Levels of oxygenation will improve Outcome: Progressing

## 2019-09-24 NOTE — Progress Notes (Addendum)
Physical Therapy Treatment Patient Details Name: Justin Boone MRN: [id number] DOB: [birth year] Today's Date: 09/24/2019    History of Present Illness Pt is a 69 y/o male admitted following fall secondary to symptomatic bradycardia. Pt sustained L knee laceration that required sutures. PMH includes COPD, a fib, CKD, CHF, CAD s/p stent placement, OSA on CPAP, and tobacco use. Pacemaker placed 4/14.     PT Comments    Pt agreeable to getting out of bed with therapy. Pt is limited in safe mobility by increased L shoulder pain from pacemaker placement and associated bleeding requiring reinforcement of dressing. Pt requires modA to come to EoB. Once seated EoB, KI was removed and replaced as it had slipped down to around his ankle. Able to reposition and provide a snug fit for OOB mobilization. Due to inability to bear weight through L UE therapist opted for a modified 2 person HHA with use of gait belt and pt supporting himself on R side with therapist shoulder. Pt modA for sit>stand and ambulation of 8 feet to sink. Pt able to stand with min A for support for 10 minutes to perform self care with 1x LoB requiring min A to steady. Given pt's increased rehab needs and pt's motivation to return to [facility] PT recommending CIR consult. PT will continue to follow acutely.     Follow Up Recommendations  CIR     Equipment Recommendations  3in1 (PT)    Recommendations for Other Services Rehab consult     Precautions / Restrictions Precautions Precautions: Fall;ICD/Pacemaker Precaution Comments: Must keep knee extended; no flexion  Required Braces or Orthoses: Knee Immobilizer - Left Knee Immobilizer - Left: Other (comment)(for comfort to keep knee extended) Restrictions Weight Bearing Restrictions: Yes LUE Weight Bearing: Partial weight bearing LUE Partial Weight Bearing Percentage or Pounds: no pulling due to pacemake placement LLE Weight Bearing: Weight bearing as tolerated    Mobility  Bed  Mobility Overal bed mobility: Needs Assistance Bed Mobility: Supine to Sit     Supine to sit: Min assist;Mod assist;+2 for physical assistance     General bed mobility comments: min A initially for pt to pull against therapist with R UE, requires modAx2 for scooting hips to EoB  Transfers Overall transfer level: Needs assistance Equipment used: 2 person hand held assist Transfers: Sit to/from Stand Sit to Stand: Min assist;+2 physical assistance         General transfer comment: musketeer assist using gait belt on R side and therapist on L side supporting L UE and assist at R hip behind back   Ambulation/Gait Ambulation/Gait assistance: Min assist;+2 physical assistance Gait Distance (Feet): 8 Feet Assistive device: 2 person hand held assist Gait Pattern/deviations: Step-to pattern;Decreased step length - right;Decreased stance time - left;Antalgic Gait velocity: slowed Gait velocity interpretation: <1.31 ft/sec, indicative of household ambulator General Gait Details: min A for steadying with 2 person assist using gait belt pt using R UE on therpist shoulder to steady, slow, step to pattern with increased weightshift to R       Balance Overall balance assessment: Needs assistance Sitting-balance support: No upper extremity supported;Feet supported Sitting balance-Leahy Scale: Fair     Standing balance support: Bilateral upper extremity supported;Single extremity supported;During functional activity Standing balance-Leahy Scale: Poor Standing balance comment: Single UE required for balance; pt with x1 LOB at sink requiring minA for steadying  Cognition Arousal/Alertness: Awake/alert Behavior During Therapy: WFL for tasks assessed/performed Overall Cognitive Status: Within Functional Limits for tasks assessed                                           General Comments General comments (skin integrity, edema,  etc.): SaO2 on RA 96%O2      Pertinent Vitals/Pain Pain Assessment: Faces Faces Pain Scale: Hurts worst Pain Location: L shoulder with movemement Pain Descriptors / Indicators: Grimacing;Guarding;Aching Pain Intervention(s): Limited activity within patient's tolerance;Monitored during session;Repositioned           PT Goals (current goals can now be found in the care plan section) Acute Rehab PT Goals Patient Stated Goal: to decrease pain  PT Goal Formulation: With patient Time For Goal Achievement: 10/04/19 Potential to Achieve Goals: Good Progress towards PT goals: Not progressing toward goals - comment(pacemaker placement has impeded progress)    Frequency    Min 3X/week      PT Plan Discharge plan needs to be updated       AM-PAC PT "6 Clicks" Mobility   Outcome Measure  Help needed turning from your back to your side while in a flat bed without using bedrails?: A Little Help needed moving from lying on your back to sitting on the side of a flat bed without using bedrails?: A Lot Help needed moving to and from a bed to a chair (including a wheelchair)?: A Lot Help needed standing up from a chair using your arms (e.g., wheelchair or bedside chair)?: A Lot Help needed to walk in hospital room?: A Lot Help needed climbing 3-5 steps with a railing? : Total 6 Click Score: 12    End of Session Equipment Utilized During Treatment: Gait belt Activity Tolerance: Patient limited by pain;Patient limited by fatigue Patient left: with call bell/phone within reach;in chair Nurse Communication: Mobility status;Other (comment)(KI use for OOB) PT Visit Diagnosis: Difficulty in walking, not elsewhere classified (R26.2);Pain Pain - Right/Left: Left Pain - part of body: Knee;Shoulder     Time: 1610-9604 PT Time Calculation (min) (ACUTE ONLY): 36 min  Charges:  $Gait Training: 8-22 mins                     Justin Boone B. Migdalia Dk PT, DPT Acute Rehabilitation  Services Pager [phone] Office [phone]    [place] 09/24/2019, 2:06 PM

## 2019-09-24 NOTE — Progress Notes (Signed)
Progress Note  Patient Name: Justin Boone Date of Encounter: 09/24/2019  Primary Cardiologist: Posey Boyer, MD   Subjective   Sitting comfortably in chair. Reviewed plan proposed by Dr. Caryl Comes. He is amenable. Discussed that coumadin would be short term to get him therapeutic gradually, then once stable can go back to [place]. Also discussed thought that cymbalta may be partially responsible for night sweats, he is nervous about cutting back short term but willing to try. Discussed potential TEE-CV next week if he tolerates anticoagulation, and he is amenable to this.  Inpatient Medications    Scheduled Meds: . amiodarone  400 mg Oral Q8H  . aspirin  81 mg Oral Daily  . budesonide (PULMICORT) nebulizer solution  0.25 mg Nebulization BID  . cephALEXin  500 mg Oral Q8H  . clopidogrel  75 mg Oral Daily  . [START ON 09/25/2019] DULoxetine  30 mg Oral Daily  . feeding supplement (GLUCERNA SHAKE)  237 mL Oral TID BM  . furosemide  80 mg Intravenous BID  . insulin aspart  0-5 Units Subcutaneous QHS  . insulin aspart  0-9 Units Subcutaneous TID WC  . ipratropium-albuterol  3 mL Nebulization BID  . lidocaine-EPINEPHrine  20 mL Intradermal Once  . metoprolol tartrate  25 mg Oral Q8H  . multivitamin with minerals  1 tablet Oral Daily  . predniSONE  20 mg Oral Q breakfast  . tamsulosin  0.4 mg Oral QPC breakfast   Continuous Infusions:  PRN Meds: acetaminophen **OR** acetaminophen, HYDROcodone-acetaminophen, morphine injection, ondansetron (ZOFRAN) IV   Vital Signs    Vitals:   09/24/19 0749 09/24/19 0821 09/24/19 0824 09/24/19 1100  BP: 103/80     Pulse: (!) 115 (!) 112    Resp: 18 18    Temp: 98.5 F (36.9 C)   98.8 F (37.1 C)  TempSrc: Oral   Oral  SpO2: 94% 94% 96%   Weight:      Height:        Intake/Output Summary (Last 24 hours) at 09/24/2019 1321 Last data filed at 09/24/2019 1252 Gross per 24 hour  Intake 940 ml  Output 2335 ml  Net -1395 ml   Last 3 Weights  09/24/2019 09/23/2019 09/22/2019  Weight (lbs) 217 lb 220 lb 214 lb  Weight (kg) 98.431 kg 99.791 kg 97.07 kg      Telemetry    Atrial flutter 110s-120s - Personally Reviewed  ECG    4/15 atrial flutter - Personally Reviewed  Physical Exam   GEN: Well nourished, well developed in no acute distress HEENT: Normal, moist mucous membranes NECK: JVD at clavicle sitting upright CARDIAC: tachycardic, irregularly irregular rhythm, normal S1 and S2, no rubs or gallops. No murmur. VASCULAR: Radial pulses 2+ bilaterally. RESPIRATORY:  Clear air movement, mild wheezing ABDOMEN: Soft, non-tender, non-distended MUSCULOSKELETAL:  Left leg in knee immobilizer. SKIN: Warm and dry, trace LE edema NEUROLOGIC:  Alert and oriented x 3. No focal neuro deficits noted. PSYCHIATRIC:  Normal affect   Labs    High Sensitivity Troponin:  No results for input(s): TROPONINIHS in the last 720 hours.    Chemistry Recent Labs  Lab 09/22/19 0502 09/23/19 0601 09/24/19 0421  NA 143 142 142  K 4.7 4.8 3.8  CL 104 104 102  CO2 30 23 29   GLUCOSE 151* 122* 113*  BUN 47* 56* 59*  CREATININE 1.96* 2.04* 2.05*  CALCIUM 8.9 8.5* 8.5*  GFRNONAA 34* 33* 33*  GFRAA 40* 38* 38*  ANIONGAP 9 15 11  Hematology Recent Labs  Lab 09/22/19 0502 09/23/19 0601 09/24/19 0421  WBC 9.6 10.7* 8.5  RBC 3.28* 3.25* 3.30*  HGB 9.5* 9.4* 9.5*  HCT 30.5* 30.5* 30.4*  MCV 93.0 93.8 92.1  MCH 29.0 28.9 28.8  MCHC 31.1 30.8 31.3  RDW 21.2* 21.7* 21.6*  PLT 131* 149* 137*    BNPNo results for input(s): BNP, PROBNP in the last 168 hours.   DDimer No results for input(s): DDIMER in the last 168 hours.   Radiology    DG Chest 2 View  Result Date: 09/23/2019 CLINICAL DATA:  69 year old male status post pacemaker insertion. EXAM: CHEST - 2 VIEW COMPARISON:  Chest x-ray 09/18/2019. FINDINGS: Lung volumes are normal. No consolidative airspace disease. Small bilateral pleural effusions. There is cephalization of the  pulmonary vasculature and slight indistinctness of the interstitial markings suggestive of mild pulmonary edema. No pneumothorax. Bibasilar opacities favored to reflect areas of subsegmental atelectasis. Mild cardiomegaly. Aortic atherosclerosis. New left-sided biventricular pacemaker with lead tips projecting over the expected location of the right atrium, right ventricle and overlying the left ventricle via the coronary sinus and coronary veins. IMPRESSION: 1. New left-sided biventricular pacemaker appears well positioned with no pneumothorax or other acute complicating features. 2. The appearance of the chest suggest congestive heart failure, as above. 3. Aortic atherosclerosis. Electronically Signed   By: Vinnie Langton M.D.   On: 09/23/2019 07:23    Cardiac Studies   Echo 09/17/19 1. Left ventricular ejection fraction, by estimation, is 40 to 45%. The  left ventricle has mildly decreased function. The left ventricle  demonstrates global hypokinesis. The left ventricular internal cavity size  was mildly dilated. Left ventricular  diastolic parameters are consistent with Grade II diastolic dysfunction  (pseudonormalization). Elevated left atrial pressure.  2. Right ventricular systolic function is mildly reduced. The right  ventricular size is mildly enlarged. There is moderately elevated  pulmonary artery systolic pressure.  3. Left atrial size was mildly dilated.  4. Right atrial size was mildly dilated.  5. The mitral valve is normal in structure. Mild mitral valve  regurgitation.  6. The aortic valve is tricuspid. Aortic valve regurgitation is mild.  Severe aortic valve stenosis.  7. The inferior vena cava is dilated in size with <50% respiratory  variability, suggesting right atrial pressure of 15 mmHg.   Patient Profile     69 y.o. male with known severe AS, planned for TAVR at [facility] 09/22/19, paroxysmal atrial fibrillation, chronic systolic dysfunction with EF 40%, CAD with DES  to RCA 08/13/19, hypertension, type II diabetes, chronic kidney disease who was admitted/for whom we are consulted for symptomatic bradycardia and possible syncope.  Assessment & Plan    Severe aortic stenosis, presumed low flow-low gradient given EF 40%: -initially planned for TAVR 09/22/19 at [facility]. This has been placed on hold per the patient given his current admission. He is debating if he should have an evaluation with the [facility] team, since he has had his admission here. I told him we are happy to help either way, whether he elects to pursue at [facility] or here.   Chronic systolic heart failure: -weight up from baseline but down from yesterday. Noted as 86.2 kg on admission, currently 98.4 kg -first several days of admission without well charted ins/outs, so difficult to assess cumulative volume balance -has chronic kidney disease, monitor Cr. Cr 2.05 today, stable from yesterday -continue IV lasix 80 mg IV BID today, may be able to change to oral tomorrow  Symptomatic  bradycardia: -now s/p BiV PPM 09/22/19  Paroxysmal atrial fibrillation: now in flutter per device interrogation -metoprolol, amiodarone restarted per EP recommendations now that pacemaker in place. Metoprolol uptitrated today per EP -appreciate EP's guidance. Recommended starting coumadin for gradual increase in INR over the weekend, to monitor pocket and leg. If he is able to be therapeutic, can then pursue TEE-CV. Can switch back to DOAC in the future once stable. This seems like a good option to me, and patient is agreeable. -CHA2DS2/VAS Stroke Risk Points=3 (age, HF, CAD)  CAD with recent DES 08/13/19: -when anticoagulation restarted, drop aspirin and continue clopidogrel and DOAC  For questions or updates, please contact [place] Please consult [email or web address] for contact info under    Signed, Buford Dresser, MD  09/24/2019, 1:21 PM

## 2019-09-24 NOTE — Progress Notes (Addendum)
Progress Note  Patient Name: Justin Boone Date of Encounter: 09/24/2019  Primary Cardiologist: Posey Boyer, MD   Subjective   No CP, not rest SOB, pacer site is very tender to palpation  Inpatient Medications    Scheduled Meds: . amiodarone  400 mg Oral Q8H  . aspirin  81 mg Oral Daily  . budesonide (PULMICORT) nebulizer solution  0.25 mg Nebulization BID  . cephALEXin  500 mg Oral Q8H  . clopidogrel  75 mg Oral Daily  . DULoxetine  60 mg Oral Daily  . feeding supplement (GLUCERNA SHAKE)  237 mL Oral TID BM  . furosemide  80 mg Intravenous BID  . insulin aspart  0-5 Units Subcutaneous QHS  . insulin aspart  0-9 Units Subcutaneous TID WC  . ipratropium-albuterol  3 mL Nebulization BID  . lidocaine-EPINEPHrine  20 mL Intradermal Once  . metoprolol tartrate  25 mg Oral Q8H  . multivitamin with minerals  1 tablet Oral Daily  . predniSONE  20 mg Oral Q breakfast  . tamsulosin  0.4 mg Oral QPC breakfast   Continuous Infusions:   PRN Meds: acetaminophen **OR** acetaminophen, HYDROcodone-acetaminophen, morphine injection, ondansetron (ZOFRAN) IV   Vital Signs    Vitals:   09/24/19 0505 09/24/19 0749 09/24/19 0821 09/24/19 0824  BP: 110/72 103/80    Pulse: (!) 116 (!) 115 (!) 112   Resp: 19 18 18    Temp: 97.7 F (36.5 C) 98.5 F (36.9 C)    TempSrc: Oral Oral    SpO2: 99% 94% 94% 96%  Weight: 98.4 kg     Height:        Intake/Output Summary (Last 24 hours) at 09/24/2019 0929 Last data filed at 09/24/2019 0903 Gross per 24 hour  Intake 940 ml  Output 1675 ml  Net -735 ml   Last 3 Weights 09/24/2019 09/23/2019 09/22/2019  Weight (lbs) 217 lb 220 lb 214 lb  Weight (kg) 98.431 kg 99.791 kg 97.07 kg      Telemetry    AFlutter 110's-120's, trigger pacing - Personally Reviewed  ECG    No new EKGs - Personally Reviewed  Physical Exam   GEN: No acute distress.   Neck:  + 8 JVD Cardiac: irreg-irreg, tachycardic, no murmurs, rubs, or gallops.  Respiratory:  end exp wheezes, moving air well, no crackles appreciated GI: Soft, nontender, non-distended  MS: trace edema; LLE in a knee immobilzer Neuro:  Nonfocal  Psych: Normal affect   PPM site: site was noted to have a light dressing, mostly off from his wound, steri strips were off.  Significant ecchymosis, no hematoma, site was dry, clean.  No signs of infection.  Labs    High Sensitivity Troponin:  No results for input(s): TROPONINIHS in the last 720 hours.    Chemistry Recent Labs  Lab 09/22/19 0502 09/23/19 0601 09/24/19 0421  NA 143 142 142  K 4.7 4.8 3.8  CL 104 104 102  CO2 30 23 29   GLUCOSE 151* 122* 113*  BUN 47* 56* 59*  CREATININE 1.96* 2.04* 2.05*  CALCIUM 8.9 8.5* 8.5*  GFRNONAA 34* 33* 33*  GFRAA 40* 38* 38*  ANIONGAP 9 15 11      Hematology Recent Labs  Lab 09/22/19 0502 09/23/19 0601 09/24/19 0421  WBC 9.6 10.7* 8.5  RBC 3.28* 3.25* 3.30*  HGB 9.5* 9.4* 9.5*  HCT 30.5* 30.5* 30.4*  MCV 93.0 93.8 92.1  MCH 29.0 28.9 28.8  MCHC 31.1 30.8 31.3  RDW 21.2* 21.7* 21.6*  PLT  131* 149* 137*    BNPNo results for input(s): BNP, PROBNP in the last 168 hours.   DDimer No results for input(s): DDIMER in the last 168 hours.   Radiology    DG Chest 2 View Result Date: 09/23/2019 CLINICAL DATA:  69 year old male status post pacemaker insertion. EXAM: CHEST - 2 VIEW COMPARISON:  Chest x-ray 09/18/2019. FINDINGS: Lung volumes are normal. No consolidative airspace disease. Small bilateral pleural effusions. There is cephalization of the pulmonary vasculature and slight indistinctness of the interstitial markings suggestive of mild pulmonary edema. No pneumothorax. Bibasilar opacities favored to reflect areas of subsegmental atelectasis. Mild cardiomegaly. Aortic atherosclerosis. New left-sided biventricular pacemaker with lead tips projecting over the expected location of the right atrium, right ventricle and overlying the left ventricle via the coronary sinus and  coronary veins. IMPRESSION: 1. New left-sided biventricular pacemaker appears well positioned with no pneumothorax or other acute complicating features. 2. The appearance of the chest suggest congestive heart failure, as above. 3. Aortic atherosclerosis. Electronically Signed   By: Vinnie Langton M.D.   On: 09/23/2019 07:23    Cardiac Studies   Echo 09/17/19 1. Left ventricular ejection fraction, by estimation, is 40 to 45%. The  left ventricle has mildly decreased function. The left ventricle  demonstrates global hypokinesis. The left ventricular internal cavity size  was mildly dilated. Left ventricular  diastolic parameters are consistent with Grade II diastolic dysfunction  (pseudonormalization). Elevated left atrial pressure.   2. Right ventricular systolic function is mildly reduced. The right  ventricular size is mildly enlarged. There is moderately elevated  pulmonary artery systolic pressure.   3. Left atrial size was mildly dilated.   4. Right atrial size was mildly dilated.   5. The mitral valve is normal in structure. Mild mitral valve  regurgitation.   6. The aortic valve is tricuspid. Aortic valve regurgitation is mild.  Severe aortic valve stenosis.   7. The inferior vena cava is dilated in size with <50% respiratory  variability, suggesting right atrial pressure of 15 mmHg.    ECHO: 06/20/2019 at [place]   1. Doppler indices suggest severe low-flow, low-gradient aortic valve stenosis.   2. There is mild aortic regurgitation.   3. The left ventricle is mildly dilated in size with normal wall thickness.   4. The left ventricular systolic function is moderately decreased, LVEF is visually estimated at 40%.   5. The mitral valve leaflets are mildly thickened with normal leaflet mobility.   6. There is moderate mitral valve regurgitation.   7. The left atrium is moderately dilated in size.   8. The right ventricle is mildly dilated in size, with mildly  reduced systolic function.   9. The right atrium is moderately dilated  in size.     CT cardiac TAVR: 09/09/2019 Vascular access measurements: Left common iliac artery: 10 x 7 mm Left external iliac artery: 9 x 7 cm Right common iliac artery: 10 x 7 mm Right external iliac artery: 8 x 6 mm.  1. Aortic annulus diameter is 27 mm. There is dense calcification of the aortic valve with an Agatston score of 839. 2. Three-vessel coronary artery atherosclerosis. 3. Moderate biatrial dilation. 4. Please see concurrent CTA chest abdomen pelvis for more accurate evaluation of noncardiac findings.   CATH: At [facility], 08/10/2018 Left Heart Catheterization (Left Radial) Under Lidocaine 2% local anesthesia, a 16F Terumo Glidesheath was placed in  the left radial artery using modified Seldinger technique. 5 mg of  verapamil were given via the sheath intra-arterially. A 0.035 Rosen wire  (260cm) was used to guide the advancement of the coronary catheter for  engagement. 4,000 units of heparin were given after the wire and catheter  crossed the aortic arch into the ascending aorta. Selective coronary  angiography was then performed in multiple views by hand injections of  Omnipaque using a 73F JR4 catheter to engage the right coronary artery and  a 73F JL4 catheter to engage the left coronary artery.  The culprit lesion was identified percutaneous coronary intervention  followed as described below.  Percutaneous Coronary Intervention The decision was made to intervene on the RCA.  PCI: Percutaneous coronary intervention performed of the mRCA  Anticoagulants:  Routine, ASA, Clopidogrel, Unfractionated Heparin Guide catheter: JR4  Guide Wire: 0.014" [place] Pre-Dilation Balloon: 2.5 mm  Stent:   Synergy 3.5x16 mm DES Post-Dilation Balloon:  N/a Result:   Excellent angiographic result with TIMI 3 flow  Following the procedure, the sheath was removed, and a TR band was applied  to achieve  hemostasis. The TR band was subsequently removed with staged  removal of air as per protocol. The patient tolerated the procedure well  without any complications.  IVUS An [place] IVUS catheter was advanced down the wire and used to  visualize the leftand right CIA,EIA, and CFA. There was mild plaquing  throughout the inflow vessel. No significant stenoses noted with CFA  around 7.5-8.4mm  FINDINGS  Hemodynamics and Left Heart Catheterization  Aortic pressure: 105/70 mm Hg (mean 86 mm Hg)  Coronary Angiography  Dominance: Right  Left Main: The left main coronary artery (LMCA) is a large-caliber vessel  that originates from the left coronary sinus. It bifurcates into the left  anterior descending (LAD) and left circumflex (LCx) arteries. There is no  angiographic evidence of significant disease in the LMCA.  LAD: The LAD is a large-caliber vessel that gives off 2 diagonal (D)  branches before it wraps around the apex. High D1 is a large-caliber,  branching vessel. D2 is a moderate-caliber vessel with a 90% stenosis in  the proximal segment. There is no other angiographic evidence of  significant disease in the LAD.  Left Circumflex: The LCx is a large-caliber vessel that gives off 3 obtuse  marginal (OM) branches and then continues as a small vessel in the AV  groove. OM1 is a large-caliber vessel. OM2 is a moderate-caliber vessel.  OM3 is a large-caliber vessel. There is no angiographic evidence of  significant disease in the LCx.  Right Coronary: The right coronary artery (RCA) is a large-caliber vessel  originating from the right coronary sinus. It bifurcates distally into a  large-caliber posterior descending artery (PDA) and small posterolateral  (PL) branches consistent with a right dominant system. There is a 90%  stenosis of the mRCA.      Patient Profile     69 y.o. male with a hx of COPD, OSA w/CPAP, HTN, DM, CKD (III), AFib/flutter, chronic CHF (systolic), VHD  w/ mod MR and severe AS, CAD  Outpatient recent evaluation for [facility] at [facility] (was planned for today initially) though delayed given hs admission here.  Admitted to [facility] with recurrent syncope. Siffered L knee trauma with very large laceration associated with significant bleeding s/p KCentra and vitamin K and PRBC, required ortho to close wound  Found bradycardic 30's-50's home BB and amiodarone stopped with improvement in HR >> eventually unfortunately  AFib/flutter w/RVR He has been diuresed as well  for volume OL  Assessment & Plan    1. Syncope 2. Paroxysmal AFib/flutter      CHA2DS2Vasc is 5, Eliquis held post knee trauma 3. Tachy-brady      S/p CRT-P  Implant Wed unfortunately we have struggled with site dressings.   He had some bleeding last night, in d/w RN he found the prior placed pressure dressing completely off the wound, as well as the steri strips, mild pressure was held and bleeding was easily stopped without further.  New steri strips were placed by myself and new presssure dressing applied this Am Dr. Caryl Comes has since been in, re-evaluated wound, no hematoma, + large area or ecchymosis to L upper arm, significant tenderness Re-applied pressure dressing and placed pocket pal on top of that  No Eliquis until cleared by EP service pending site stability We wiill follow his site   We will increase his amiodarone to TID to try and gain better rate control Will program his device to DDI 60 to avoid tracking/fusion pacing for now   4. Acute/chronic CHF    I/O weights difficult to interpret,  Pt reports much more urine output yesterday then charted    In comparison to Wed, Dr. Caryl Comes feels volume status has improved    Lasix ordered for today    Creat at his baseline  Will defer further HF management to cardiology team   5. CAD     PCI last month     Back on his ASA/Plavix     No CP         For questions or updates, please contact [place] Please consult [email or web address] for contact info under        Signed, Baldwin [place], PA-C  09/24/2019, 9:29 AM    As above-- he has had night sweats which caused the loss of the bandage last night- but no fevers-- may need further investigation on its own-- It can be assoc with cymbalta so will take the liberty of decreasing 60>>30 mg daily for about one week  Maybe it will help [place] get through this healing process  We have reapplied a pressure dressing and the pressure PAL to support hemostasis -- will use coumadin for now as it will be gradual and has a much lower risk of bleeding than the NOACs    That will offer an opportunity perhaps next week to undertake DCCV for his atrial flutter  Amiod will be increased  it has already accomplished HR 143>>117 2/2 its conduction slowing properties on the the atrial flutter cycle length ( he remains in 2:1 conduction)  Reviewed with pt  He remains volume overloaded   Cr is stable and would continue IV diuresis maybe one more day-- His BUN has gone up so we are getting close

## 2019-09-24 NOTE — Progress Notes (Signed)
Patient has home CPAP unit and it is setup and at bedside. Patient places himself on and will call if any further assistance is needed.

## 2019-09-24 NOTE — Plan of Care (Signed)
  Problem: Nutrition: Goal: Adequate nutrition will be maintained Outcome: Completed/Met   Problem: Coping: Goal: Level of anxiety will decrease Outcome: Completed/Met   Problem: Safety: Goal: Ability to remain free from injury will improve Outcome: Completed/Met

## 2019-09-25 DIAGNOSIS — I509 Heart failure, unspecified: Secondary | ICD-10-CM | POA: Diagnosis not present

## 2019-09-25 DIAGNOSIS — Z950 Presence of cardiac pacemaker: Secondary | ICD-10-CM

## 2019-09-25 DIAGNOSIS — I2510 Atherosclerotic heart disease of native coronary artery without angina pectoris: Secondary | ICD-10-CM | POA: Diagnosis not present

## 2019-09-25 DIAGNOSIS — I5022 Chronic systolic (congestive) heart failure: Secondary | ICD-10-CM | POA: Diagnosis not present

## 2019-09-25 DIAGNOSIS — I480 Paroxysmal atrial fibrillation: Secondary | ICD-10-CM | POA: Diagnosis not present

## 2019-09-25 DIAGNOSIS — I350 Nonrheumatic aortic (valve) stenosis: Secondary | ICD-10-CM | POA: Diagnosis not present

## 2019-09-25 LAB — GLUCOSE, CAPILLARY
Glucose-Capillary: 96 mg/dL (ref 70–99)
Glucose-Capillary: 149 mg/dL — ABNORMAL HIGH (ref 70–99)
Glucose-Capillary: 169 mg/dL — ABNORMAL HIGH (ref 70–99)
Glucose-Capillary: 125 mg/dL — ABNORMAL HIGH (ref 70–99)

## 2019-09-25 LAB — BASIC METABOLIC PANEL
Anion gap: 9 (ref 5–15)
BUN: 52 mg/dL — ABNORMAL HIGH (ref 8–23)
CO2: 33 mmol/L — ABNORMAL HIGH (ref 22–32)
Calcium: 8.4 mg/dL — ABNORMAL LOW (ref 8.9–10.3)
Chloride: 100 mmol/L (ref 98–111)
Creatinine, Ser: 2.27 mg/dL — ABNORMAL HIGH (ref 0.61–1.24)
GFR calc Af Amer: 33 mL/min — ABNORMAL LOW (ref >60)
GFR calc non Af Amer: 29 mL/min — ABNORMAL LOW (ref >60)
Glucose, Bld: 102 mg/dL — ABNORMAL HIGH (ref 70–99)
Potassium: 3.5 mmol/L (ref 3.5–5.1)
Sodium: 142 mmol/L (ref 135–145)

## 2019-09-25 LAB — CBC
HCT: 30 % — ABNORMAL LOW (ref 39.0–52.0)
Hemoglobin: 9.6 g/dL — ABNORMAL LOW (ref 13.0–17.0)
MCH: 29.7 pg (ref 26.0–34.0)
MCHC: 32 g/dL (ref 30.0–36.0)
MCV: 92.9 fL (ref 80.0–100.0)
Platelets: 163 10*3/uL (ref 150–400)
RBC: 3.23 MIL/uL — ABNORMAL LOW (ref 4.22–5.81)
RDW: 21.8 % — ABNORMAL HIGH (ref 11.5–15.5)
WBC: 9.5 10*3/uL (ref 4.0–10.5)
nRBC: 0.2 % (ref 0.0–0.2)

## 2019-09-25 LAB — PROTIME-INR
INR: 1.2 (ref 0.8–1.2)
Prothrombin Time: 15.1 seconds (ref 11.4–15.2)

## 2019-09-25 MED ORDER — POTASSIUM CHLORIDE CRYS ER 20 MEQ PO TBCR
40.0000 meq | EXTENDED_RELEASE_TABLET | Freq: Once | ORAL | Status: AC
  Administered 2019-09-25: 40 meq via ORAL
  Filled 2019-09-25: qty 2

## 2019-09-25 MED ORDER — WARFARIN SODIUM 2 MG PO TABS
4.0000 mg | ORAL_TABLET | Freq: Once | ORAL | Status: AC
  Administered 2019-09-25: 4 mg via ORAL
  Filled 2019-09-25: qty 2

## 2019-09-25 MED ORDER — FUROSEMIDE 80 MG PO TABS
80.0000 mg | ORAL_TABLET | Freq: Two times a day (BID) | ORAL | Status: DC
  Administered 2019-09-25 – 2019-09-26 (×4): 80 mg via ORAL
  Filled 2019-09-25 (×4): qty 1

## 2019-09-25 NOTE — Plan of Care (Signed)
  Problem: Activity: Goal: Ability to return to baseline activity level will improve Outcome: Progressing   Problem: Health Behavior/Discharge Planning: Goal: Ability to safely manage health-related needs after discharge will improve Outcome: Progressing   Problem: Education: Goal: Knowledge of disease or condition will improve Outcome: Progressing   Problem: Clinical Measurements: Goal: Ability to maintain clinical measurements within normal limits will improve Outcome: Progressing

## 2019-09-25 NOTE — Progress Notes (Signed)
PROGRESS NOTE  Kiree Dejarnette STM:[id number] DOB: 08-29-1950 DOA: 09/16/2019 PCP: Patient, No Pcp Per  Brief Narrative:  69 year old gentleman prior history of atrial fibrillation on Eliquis, diabetes mellitus, hypertension, COPD, obstructive sleep apnea on CPAP, ongoing tobacco use stage IIIb CKD, atrial fibrillation/flutter s/p cardioversion on amiodarone and Eliquis, chronic systolic heart failure, recent left heart catheterization in the setting of TAVR work-up on 08/12/2019, s/p drug-eluting stent to the LAD on Plavix and Eliquis presents to ED after a fall and left knee laceration.  X-rays of the left knee shows mild to moderate amount of infrapatellar soft tissue swelling and soft tissue air without evidence of acute abnormality.  Due to persistent bleeding orthopedics consulted and suggested to continue with compression wrapping and no indication of urgent washout of the knee in the OR.  Patient started bleeding profusely and persistently from the laceration soaking his dressing, bandages in the bed.  Patient's hemoglobin has dropped from 9.9-7.8 the last 24 hours from profuse bleeding from the left knee laceration.  Discussed with pharmacy regarding Andexxa suggested that this is not a life-threatening bleeding and his last Eliquis dose was on the morning of 09/16/2019, he is out of the window and has been 24 hours since her last Eliquis dose.  He was given  K Centra and vitamin K respiratory anticoagulation antidote protocol. His bleeding has stopped since then and Orthopedics recommended follow-up for left knee laceration evaluation and dressing change in 1 to 2 weeks. Called his cardiology office at [facility], who notified me that they are not going ahead with TAVR at this time as his aortic stenosis is only moderate. 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 Therapy evaluations recommending SNF. TOC on board for placement.     HPI/Recap of past 24 hours:  He  is status post pacemaker placement on 4/14, has some issues with pocksite bleeding with a pressure dressing on, he is complaining of associated pain  Currently paced rhythm, heart rate  70-80's, bp low normal Coumadin started on 4/16, inr today is 1.2 He denies chest pain, no short of breath, lower extremity edema has resolved    Assessment/Plan: Principal Problem:   Symptomatic bradycardia Active Problems:   Paroxysmal atrial fibrillation (HCC)   QT prolongation   COPD with acute exacerbation (HCC)   AKI (acute kidney injury) ([place])   Knee laceration, left, initial encounter   Chronic systolic heart failure (HCC)   Severe aortic stenosis   CAD (coronary artery disease)   S/P primary angioplasty with coronary stent   Left knee laceration with profuse and persistent bleeding/acute blood loss anemia on admission  After syncope episode  Bleeding has been controlled with compression bandages which have been changed again by orthopedics.   1 dose of Kcentra given on admission and bleeding stopped.   Orthopedics recommends weightbearing as tolerated on the left lower extremity and to keep the left knee extended and straight for 2 weeks and follow-up with orthopedics in 1 to 2 weeks for laceration evaluation and dressing changes.   Okay to use knee immobilizer for comfort/compliance of knee extension.   Was on IV antibiotics , now on oral Keflex on discharge to complete the 10-day course,   elevate extremity and encourage ankle flexion and extension to facilitate movement.  paf with tachy/brady syndrome/syncope Cardiology/ep input appreciated, s/p pacemaker placement on April 14, started on amiodarone Issues with  pocket site bleeding with pressure dressing on Per cardiology, plan to start coumadin  and for possible cardioversion next week Will follow cardiology recommendation   Acute on Chronic systolic heart failure Echocardiogram showed LVEF of 40 to 45%, with mildly decreased  function. The left ventricle shows global hypokinesis. Left ventricular diastolic parameters are consistent with grade 2 diastolic dysfunction.  Negative 7liters, was on iv lasix, now on oral lasix 49m bid   strict intake and output and daily weights, edema has resolved  Will follow cardiology recommendation   AKI on stage III CKD Cr 3 on presentaion,  Ultrasound of the kidney did not show any hydronephrosis or obstruction. Creatinine around 2  Renal dosing meds.   Diet controlled dm2 a1c 6 A.m. blood glucose 102  COPD Tobacco abuse Counseled against tobacco use Wheezing has resolved, transitioned to oral prednisone and plan to taper to off.  Weaned off oxygen.   Obstructive sleep apnea on CPAP at night continue the same.  FTT: PT recommend snf placement then cir, patient prefers to go home  DVT Prophylaxis: resume eliquis when ok with cardiology  Code Status: full  Family Communication: patient   Disposition Plan:    Patient came from:         home                                                                                                 Anticipated d/c place:  cir vs home with home health Barriers to d/c OR conditions which need to be met to effect a safe  needs cardioversion   Consultants:  Cardiology/EP  Ortho  Critical care  Wound care  Procedures:  prbc transfusion  Pacemaker placement  Antibiotics:  Rocephin then Keflex   Objective: BP 94/68   Pulse 75   Temp 98.5 F (36.9 C) (Oral)   Resp 16   Ht 6' (1.829 m)   Wt 97.1 kg   SpO2 100%   BMI 29.02 kg/m   Intake/Output Summary (Last 24 hours) at 09/25/2019 1035 Last data filed at 09/25/2019 [id number] per 24 hour  Intake 1297 ml  Output 3940 ml  Net -2643 ml   Filed Weights   09/23/19 0021 09/24/19 0505 09/25/19 0050  Weight: 99.8 kg 98.4 kg 97.1 kg    Exam: Patient is examined daily including today on 09/25/2019, exams remain the same as of yesterday except that has  changed    General:  NAD  Cardiovascular: paced rhytm  Respiratory: CTABL  Abdomen: Soft/ND/NT, positive BS  Musculoskeletal: left knee in immobolizer, lower extremity edema  Neuro: alert, oriented   Data Reviewed: Basic Metabolic Panel: Recent Labs  Lab 09/21/19 0646 09/22/19 0502 09/23/19 0601 09/24/19 0421 09/25/19 0451  NA 139 143 142 142 142  K 4.3 4.7 4.8 3.8 3.5  CL 100 104 104 102 100  CO2 _0 33*  GLUCOSE 159* 151* 122* 113* 102*  BUN 45* 47* 56* 59* 52*  CREATININE 2.01* 1.96* 2.04* 2.05* 2.27*  CALCIUM 8.7* 8.9 8.5* 8.5* 8.4*  MG 2.3  --   --  2.3  --    Liver Function Tests: No results for  input(s): AST, ALT, ALKPHOS, BILITOT, PROT, ALBUMIN in the last 168 hours. No results for input(s): LIPASE, AMYLASE in the last 168 hours. No results for input(s): AMMONIA in the last 168 hours. CBC: Recent Labs  Lab 09/19/19 1223 09/19/19 1814 09/20/19 0533 09/20/19 1202 09/21/19 0646 09/22/19 0502 09/23/19 0601 09/24/19 0421 09/25/19 0451  WBC 9.4  --  8.7  --   --  9.6 10.7* 8.5 9.5  NEUTROABS 8.1*  --   --   --   --   --   --   --   --   HGB 9.1*   < > 9.0*   < > 9.6* 9.5* 9.4* 9.5* 9.6*  HCT 28.8*   < > 28.4*   < > 30.9* 30.5* 30.5* 30.4* 30.0*  MCV 90.3  --  90.2  --   --  93.0 93.8 92.1 92.9  PLT 97*  --  124*  --   --  131* 149* 137* 163   < > = values in this interval not displayed.   Cardiac Enzymes:   No results for input(s): CKTOTAL, CKMB, CKMBINDEX, TROPONINI in the last 168 hours. BNP (last 3 results) No results for input(s): BNP in the last 8760 hours.  ProBNP (last 3 results) No results for input(s): PROBNP in the last 8760 hours.  CBG: Recent Labs  Lab 09/24/19 0540 09/24/19 1108 09/24/19 1751 09/24/19 2115 09/25/19 0621  GLUCAP 97 139* 113* 141* 96    Recent Results (from the past 240 hour(s))  Respiratory Panel by RT PCR (Flu A&B, Covid) - Nasopharyngeal Swab     Status: None   Collection Time: 09/16/19  8:24 PM    Specimen: Nasopharyngeal Swab  Result Value Ref Range Status   SARS Coronavirus 2 by RT PCR NEGATIVE NEGATIVE Final    Comment: (NOTE) SARS-CoV-2 target nucleic acids are NOT DETECTED. The SARS-CoV-2 RNA is generally detectable in upper respiratoy specimens during the acute phase of infection. The lowest concentration of SARS-CoV-2 viral copies this assay can detect is 131 copies/mL. A negative result does not preclude SARS-Cov-2 infection and should not be used as the sole basis for treatment or other patient management decisions. A negative result may occur with  improper specimen collection/handling, submission of specimen other than nasopharyngeal swab, presence of viral mutation(s) within the areas targeted by this assay, and inadequate number of viral copies (<131 copies/mL). A negative result must be combined with clinical observations, patient history, and epidemiological information. The expected result is Negative. Fact Sheet for Patients:  PinkCheek.be Fact Sheet for Healthcare Providers:  GravelBags.it This test is not yet ap proved or cleared by the [place] FDA and  has been authorized for detection and/or diagnosis of SARS-CoV-2 by FDA under an Emergency Use Authorization (EUA). This EUA will remain  in effect (meaning this test can be used) for the duration of the COVID-19 declaration under Section 564(b)(1) of the Act, 21 U.S.C. section 360bbb-3(b)(1), unless the authorization is terminated or revoked sooner.    Influenza A by PCR NEGATIVE NEGATIVE Final   Influenza B by PCR NEGATIVE NEGATIVE Final    Comment: (NOTE) The Xpert Xpress SARS-CoV-2/FLU/RSV assay is intended as an aid in  the diagnosis of influenza from Nasopharyngeal swab specimens and  should not be used as a sole basis for treatment. Nasal washings and  aspirates are unacceptable for Xpert Xpress SARS-CoV-2/FLU/RSV  testing. Fact Sheet  for Patients: PinkCheek.be Fact Sheet for Healthcare Providers: GravelBags.it This test is not yet  approved or cleared by the [place] and  has been authorized for detection and/or diagnosis of SARS-CoV-2 by  FDA under an Emergency Use Authorization (EUA). This EUA will remain  in effect (meaning this test can be used) for the duration of the  Covid-19 declaration under Section 564(b)(1) of the Act, 21  U.S.C. section 360bbb-3(b)(1), unless the authorization is  terminated or revoked. Performed at [facility], [place] [address] [place], [place] [address]   Surgical PCR screen     Status: None   Collection Time: 09/21/19 10:17 PM   Specimen: Nasal Mucosa; Nasal Swab  Result Value Ref Range Status   MRSA, PCR NEGATIVE NEGATIVE Final   Staphylococcus aureus NEGATIVE NEGATIVE Final    Comment: (NOTE) The Xpert SA Assay (FDA approved for NASAL specimens in patients 6 years of age and older), is one component of a comprehensive surveillance program. It is not intended to diagnose infection nor to guide or monitor treatment. Performed at [facility], [place] [address], [place], [place] [address]      Studies: No results found.  Scheduled Meds: . amiodarone  400 mg Oral Q8H  . aspirin  81 mg Oral Daily  . budesonide (PULMICORT) nebulizer solution  0.25 mg Nebulization BID  . cephALEXin  500 mg Oral Q8H  . clopidogrel  75 mg Oral Daily  . DULoxetine  30 mg Oral Daily  . feeding supplement (GLUCERNA SHAKE)  237 mL Oral TID BM  . furosemide  80 mg Oral BID  . insulin aspart  0-5 Units Subcutaneous QHS  . insulin aspart  0-9 Units Subcutaneous TID WC  . lidocaine-EPINEPHrine  20 mL Intradermal Once  . metoprolol tartrate  25 mg Oral Q8H  . multivitamin with minerals  1 tablet Oral Daily  . potassium chloride  40 mEq Oral Once  . predniSONE  10 mg Oral Q breakfast  . tamsulosin  0.4 mg Oral QPC breakfast   . Warfarin - Pharmacist Dosing Inpatient   Does not apply q1600    Continuous Infusions:    Time spent: 42mns I have personally reviewed and interpreted on  09/25/2019 daily labs, tele strips, imagings as discussed above under date review session and assessment and plans.  I reviewed all nursing notes, pharmacy notes, consultant notes,  vitals, pertinent old records  I have discussed plan of care as described above with RN , patient  on 09/25/2019   FFlorencia ReasonsMD, PhD, FACP  [facility]  Available via Epic secure chat 7am-7pm for nonurgent issues Please page for urgent issues, pager number available through [email or web address] .   09/25/2019, 10:35 AM  LOS: 9 days

## 2019-09-25 NOTE — Progress Notes (Signed)
Progress Note  Patient Name: Justin Boone Date of Encounter: 09/25/2019  Primary Cardiologist: Posey Boyer, MD   Subjective   Pain flat in bed.  Currently feeling well.  Does complain of pain at his pacemaker site  Inpatient Medications    Scheduled Meds: . amiodarone  400 mg Oral Q8H  . aspirin  81 mg Oral Daily  . budesonide (PULMICORT) nebulizer solution  0.25 mg Nebulization BID  . cephALEXin  500 mg Oral Q8H  . clopidogrel  75 mg Oral Daily  . DULoxetine  30 mg Oral Daily  . feeding supplement (GLUCERNA SHAKE)  237 mL Oral TID BM  . furosemide  80 mg Intravenous BID  . insulin aspart  0-5 Units Subcutaneous QHS  . insulin aspart  0-9 Units Subcutaneous TID WC  . ipratropium-albuterol  3 mL Nebulization BID  . lidocaine-EPINEPHrine  20 mL Intradermal Once  . metoprolol tartrate  25 mg Oral Q8H  . multivitamin with minerals  1 tablet Oral Daily  . predniSONE  10 mg Oral Q breakfast  . tamsulosin  0.4 mg Oral QPC breakfast  . Warfarin - Pharmacist Dosing Inpatient   Does not apply q1600   Continuous Infusions:  PRN Meds: acetaminophen **OR** acetaminophen, HYDROcodone-acetaminophen, morphine injection, ondansetron (ZOFRAN) IV   Vital Signs    Vitals:   09/25/19 0332 09/25/19 0400 09/25/19 0811 09/25/19 0837  BP: 111/78 114/73    Pulse: 82     Resp: 16     Temp: 97.7 F (36.5 C)  98.5 F (36.9 C)   TempSrc: Oral  Oral   SpO2: 98%   100%  Weight:      Height:        Intake/Output Summary (Last 24 hours) at 09/25/2019 1275 Last data filed at 09/25/2019 1700 Gross per 24 hour  Intake 1297 ml  Output 3940 ml  Net -2643 ml   Last 3 Weights 09/25/2019 09/24/2019 09/23/2019  Weight (lbs) 214 lb 217 lb 220 lb  Weight (kg) 97.07 kg 98.431 kg 99.791 kg      Telemetry    Atrial flutter- Personally Reviewed  ECG    None new  - Personally Reviewed  Physical Exam   GEN: Well nourished, well developed, in no acute distress  HEENT: normal  Neck: no  JVD, carotid bruits, or masses Cardiac: RRR; no murmurs, rubs, or gallops,no edema  Respiratory:  clear to auscultation bilaterally, normal work of breathing GI: soft, nontender, nondistended, + BS MS: no deformity or atrophy  Skin: warm and dry Neuro:  Strength and sensation are intact Psych: euthymic mood, full affect  Pacemaker site with pocket pal and pressure dressing, no new obvious bleeding.   Labs    High Sensitivity Troponin:  No results for input(s): TROPONINIHS in the last 720 hours.    Chemistry Recent Labs  Lab 09/23/19 0601 09/24/19 0421 09/25/19 0451  NA 142 142 142  K 4.8 3.8 3.5  CL 104 102 100  CO2 23 29 33*  GLUCOSE 122* 113* 102*  BUN 56* 59* 52*  CREATININE 2.04* 2.05* 2.27*  CALCIUM 8.5* 8.5* 8.4*  GFRNONAA 33* 33* 29*  GFRAA 38* 38* 33*  ANIONGAP 15 11 9      Hematology Recent Labs  Lab 09/23/19 0601 09/24/19 0421 09/25/19 0451  WBC 10.7* 8.5 9.5  RBC 3.25* 3.30* 3.23*  HGB 9.4* 9.5* 9.6*  HCT 30.5* 30.4* 30.0*  MCV 93.8 92.1 92.9  MCH 28.9 28.8 29.7  MCHC 30.8 31.3 32.0  RDW 21.7* 21.6* 21.8*  PLT 149* 137* 163    BNPNo results for input(s): BNP, PROBNP in the last 168 hours.   DDimer No results for input(s): DDIMER in the last 168 hours.   Radiology    DG Chest 2 View Result Date: 09/23/2019 CLINICAL DATA:  69 year old male status post pacemaker insertion. EXAM: CHEST - 2 VIEW COMPARISON:  Chest x-ray 09/18/2019. FINDINGS: Lung volumes are normal. No consolidative airspace disease. Small bilateral pleural effusions. There is cephalization of the pulmonary vasculature and slight indistinctness of the interstitial markings suggestive of mild pulmonary edema. No pneumothorax. Bibasilar opacities favored to reflect areas of subsegmental atelectasis. Mild cardiomegaly. Aortic atherosclerosis. New left-sided biventricular pacemaker with lead tips projecting over the expected location of the right atrium, right ventricle and overlying the  left ventricle via the coronary sinus and coronary veins. IMPRESSION: 1. New left-sided biventricular pacemaker appears well positioned with no pneumothorax or other acute complicating features. 2. The appearance of the chest suggest congestive heart failure, as above. 3. Aortic atherosclerosis. Electronically Signed   By: Vinnie Langton M.D.   On: 09/23/2019 07:23    Cardiac Studies   Echo 09/17/19 1. Left ventricular ejection fraction, by estimation, is 40 to 45%. The  left ventricle has mildly decreased function. The left ventricle  demonstrates global hypokinesis. The left ventricular internal cavity size  was mildly dilated. Left ventricular  diastolic parameters are consistent with Grade II diastolic dysfunction  (pseudonormalization). Elevated left atrial pressure.   2. Right ventricular systolic function is mildly reduced. The right  ventricular size is mildly enlarged. There is moderately elevated  pulmonary artery systolic pressure.   3. Left atrial size was mildly dilated.   4. Right atrial size was mildly dilated.   5. The mitral valve is normal in structure. Mild mitral valve  regurgitation.   6. The aortic valve is tricuspid. Aortic valve regurgitation is mild.  Severe aortic valve stenosis.   7. The inferior vena cava is dilated in size with <50% respiratory  variability, suggesting right atrial pressure of 15 mmHg.    ECHO: 06/20/2019 at [place]   1. Doppler indices suggest severe low-flow, low-gradient aortic valve stenosis.   2. There is mild aortic regurgitation.   3. The left ventricle is mildly dilated in size with normal wall thickness.   4. The left ventricular systolic function is moderately decreased, LVEF is visually estimated at 40%.   5. The mitral valve leaflets are mildly thickened with normal leaflet mobility.   6. There is moderate mitral valve regurgitation.   7. The left atrium is moderately dilated in size.   8. The right ventricle is  mildly dilated in size, with mildly reduced systolic function.   9. The right atrium is moderately dilated  in size.     CT cardiac TAVR: 09/09/2019 Vascular access measurements: Left common iliac artery: 10 x 7 mm Left external iliac artery: 9 x 7 cm Right common iliac artery: 10 x 7 mm Right external iliac artery: 8 x 6 mm.  1. Aortic annulus diameter is 27 mm. There is dense calcification of the aortic valve with an Agatston score of 839. 2. Three-vessel coronary artery atherosclerosis. 3. Moderate biatrial dilation. 4. Please see concurrent CTA chest abdomen pelvis for more accurate evaluation of noncardiac findings.   CATH: At [facility], 08/10/2018 Left Heart Catheterization (Left Radial) Under Lidocaine 2% local anesthesia, a 47F Terumo Glidesheath was placed in  the left radial artery using modified  Seldinger technique. 5 mg of  verapamil were given via the sheath intra-arterially. A 0.035 Rosen wire  (260cm) was used to guide the advancement of the coronary catheter for  engagement. 4,000 units of heparin were given after the wire and catheter  crossed the aortic arch into the ascending aorta. Selective coronary  angiography was then performed in multiple views by hand injections of  Omnipaque using a 51F JR4 catheter to engage the right coronary artery and  a 51F JL4 catheter to engage the left coronary artery.  The culprit lesion was identified percutaneous coronary intervention  followed as described below.  Percutaneous Coronary Intervention The decision was made to intervene on the RCA.  PCI: Percutaneous coronary intervention performed of the mRCA  Anticoagulants:  Routine, ASA, Clopidogrel, Unfractionated Heparin Guide catheter: JR4  Guide Wire: 0.014" [place] Pre-Dilation Balloon: 2.5 mm  Stent:   Synergy 3.5x16 mm DES Post-Dilation Balloon:  N/a Result:   Excellent angiographic result with TIMI 3 flow  Following the procedure, the sheath was removed, and a TR  band was applied  to achieve hemostasis. The TR band was subsequently removed with staged  removal of air as per protocol. The patient tolerated the procedure well  without any complications.  IVUS An [place] IVUS catheter was advanced down the wire and used to  visualize the leftand right CIA,EIA, and CFA. There was mild plaquing  throughout the inflow vessel. No significant stenoses noted with CFA  around 7.5-8.34mm  FINDINGS  Hemodynamics and Left Heart Catheterization  Aortic pressure: 105/70 mm Hg (mean 86 mm Hg)  Coronary Angiography  Dominance: Right  Left Main: The left main coronary artery (LMCA) is a large-caliber vessel  that originates from the left coronary sinus. It bifurcates into the left  anterior descending (LAD) and left circumflex (LCx) arteries. There is no  angiographic evidence of significant disease in the LMCA.  LAD: The LAD is a large-caliber vessel that gives off 2 diagonal (D)  branches before it wraps around the apex. High D1 is a large-caliber,  branching vessel. D2 is a moderate-caliber vessel with a 90% stenosis in  the proximal segment. There is no other angiographic evidence of  significant disease in the LAD.  Left Circumflex: The LCx is a large-caliber vessel that gives off 3 obtuse  marginal (OM) branches and then continues as a small vessel in the AV  groove. OM1 is a large-caliber vessel. OM2 is a moderate-caliber vessel.  OM3 is a large-caliber vessel. There is no angiographic evidence of  significant disease in the LCx.  Right Coronary: The right coronary artery (RCA) is a large-caliber vessel  originating from the right coronary sinus. It bifurcates distally into a  large-caliber posterior descending artery (PDA) and small posterolateral  (PL) branches consistent with a right dominant system. There is a 90%  stenosis of the mRCA.      Patient Profile     68 y.o. male with a hx of COPD, OSA w/CPAP, HTN, DM, CKD (III),  AFib/flutter, chronic CHF (systolic), VHD w/ mod MR and severe AS, CAD  Outpatient recent evaluation for [facility]AVR at [facility] (was planned for today initially) though delayed given hs admission here.  Admitted to [facility] with recurrent syncope. Siffered L knee trauma with very large laceration associated with significant bleeding s/p KCentra and vitamin K and PRBC, required ortho to close wound  Found bradycardic 30's-50's home BB and amiodarone stopped with improvement in HR >> eventually unfortunately  AFib/flutter w/RVR  He has been diuresed as well for volume OL  Assessment & Plan    1. Syncope 2. Paroxysmal AFib/flutter      CHA2DS2Vasc is 5, Eliquis held post knee trauma 3. Tachy-brady      Status post CRT-P.  Had significant bleeding and now has a pocket Howell and pressure dressing in place.  We Carlee Vonderhaar continue at least overnight tonight.  Does not appear that there is further bleeding around the site.  Agree with Coumadin, letting INR slowly increase.  Ajwa Kimberley need TEE and cardioversion once Coumadin is therapeutic.  May be able to take off pressure dressing tomorrow.   4. Acute/chronic CHF Creatinine mildly increased today.  Continues to have evidence of volume overload.  We Aliyha Fornes switch to p.o. Lasix.   5. CAD PCI last month.  Currently on aspirin and Plavix.  No chest pain.  6.  Severe aortic stenosis: Plan for TAVR at [facility] post hospitalization.    For questions or updates, please contact [place] Please consult [email or web address] for contact info under        Signed, Tequisha Maahs Meredith Leeds, MD  09/25/2019, 9:03 AM    As above-- he has had night sweats which caused the loss of the bandage last night- but no fevers-- may need further investigation on its own-- It can be assoc with cymbalta so Venise Ellingwood take the liberty of decreasing 60>>30 mg daily for about one week  Maybe it Havanah Nelms help [place] get through this healing process  We have reapplied a pressure dressing and the pressure  PAL to support hemostasis -- Kajal Scalici use coumadin for now as it Wiliam Cauthorn be gradual and has a much lower risk of bleeding than the NOACs    That Jeanine Caven offer an opportunity perhaps next week to undertake DCCV for his atrial flutter  Amiod Mimi Debellis be increased  it has already accomplished HR 143>>117 2/2 its conduction slowing properties on the the atrial flutter cycle length ( he remains in 2:1 conduction)  Reviewed with pt  He remains volume overloaded   Cr is stable and would continue IV diuresis maybe one more day-- His BUN has gone up so we are getting close

## 2019-09-25 NOTE — Progress Notes (Signed)
ANTICOAGULATION CONSULT NOTE - Initial Consult  Pharmacy Consult for coumadin Indication: atrial fibrillation  Allergies  Allergen Reactions  . Contrast Media [Iodinated Diagnostic Agents]   . Sulfa Antibiotics     Patient Measurements: Height: 6' (182.9 cm) Weight: 97.1 kg (214 lb) IBW/kg (Calculated) : 77.6 Heparin Dosing Weight:   Vital Signs: Temp: 98.5 F (36.9 C) (04/17 0811) Temp Source: Oral (04/17 0811) BP: 94/68 (04/17 0809) Pulse Rate: 75 (04/17 0809)  Labs: Recent Labs    09/23/19 0601 09/23/19 0601 09/24/19 0421 09/24/19 1442 09/25/19 0451  HGB 9.4*   < > 9.5*  --  9.6*  HCT 30.5*  --  30.4*  --  30.0*  PLT 149*  --  137*  --  163  LABPROT  --   --   --  15.4* 15.1  INR  --   --   --  1.2 1.2  CREATININE 2.04*  --  2.05*  --  2.27*   < > = values in this interval not displayed.    Estimated Creatinine Clearance: 38.1 mL/min (A) (by C-G formula based on SCr of 2.27 mg/dL (H)).   Medical History: Past Medical History:  Diagnosis Date  . A-fib ([place])   . Bradycardia 09/17/2019  . CHF (congestive heart failure) ([place])   . Diabetes mellitus type II, controlled ([place])   . HTN (hypertension)     Medications:  Medications Prior to Admission  Medication Sig Dispense Refill Last Dose  . albuterol (PROVENTIL) (2.5 MG/3ML) 0.083% nebulizer solution Inhale 3 mLs into the lungs every 6 (six) hours as needed for shortness of breath or wheezing.   unk  . allopurinol (ZYLOPRIM) 300 MG tablet Take 300 mg by mouth daily.   09/16/2019 at Unknown time  . amiodarone (PACERONE) 200 MG tablet Take 200 mg by mouth daily.   09/16/2019 at Unknown time  . atorvastatin (LIPITOR) 80 MG tablet Take 80 mg by mouth daily.   09/16/2019 at Unknown time  . clopidogrel (PLAVIX) 75 MG tablet Take 75 mg by mouth daily.   09/16/2019 at 0900  . DULoxetine (CYMBALTA) 60 MG capsule Take 60 mg by mouth daily.   09/16/2019 at Unknown time  . ELIQUIS 5 MG TABS tablet Take 5 mg by mouth 2 (two) times  daily.   09/16/2019 at 0900  . metoprolol succinate (TOPROL-XL) 50 MG 24 hr tablet Take 50 mg by mouth daily.   09/16/2019 at 0900  . tamsulosin (FLOMAX) 0.4 MG CAPS capsule Take 0.4 mg by mouth daily.   09/16/2019 at Unknown time  . torsemide (DEMADEX) 20 MG tablet Take 20 mg by mouth daily.   09/16/2019 at Unknown time  . TRELEGY ELLIPTA 100-62.5-25 MCG/INH AEPB Inhale 1 puff into the lungs daily.   09/16/2019 at Unknown time   Scheduled:  . amiodarone  400 mg Oral Q8H  . aspirin  81 mg Oral Daily  . budesonide (PULMICORT) nebulizer solution  0.25 mg Nebulization BID  . cephALEXin  500 mg Oral Q8H  . clopidogrel  75 mg Oral Daily  . DULoxetine  30 mg Oral Daily  . feeding supplement (GLUCERNA SHAKE)  237 mL Oral TID BM  . furosemide  80 mg Oral BID  . insulin aspart  0-5 Units Subcutaneous QHS  . insulin aspart  0-9 Units Subcutaneous TID WC  . lidocaine-EPINEPHrine  20 mL Intradermal Once  . metoprolol tartrate  25 mg Oral Q8H  . multivitamin with minerals  1 tablet Oral Daily  .  potassium chloride  40 mEq Oral Once  . predniSONE  10 mg Oral Q breakfast  . tamsulosin  0.4 mg Oral QPC breakfast  . Warfarin - Pharmacist Dosing Inpatient   Does not apply q1600   Infusions:    Assessment: 35 yom admitted after fall and knee laceration. He got a pacer implanted on 4/14. He was on Eliquis PTA for Afib (CHADSVASc 5). The INR was elevated on admission at 2.4. He received 10mg  of vit K and KCentra 4/9. He has had some bleeding from the pocket site so Coumadin will be used for now instead of the DOAC. Plans for TEE with DCCV next week once INR therapeutic. We are asked to dose coumadin and let INR trend up non-aggressively. Please note that amiodarone may increase warfarin's effect on INR.   INR this morning is sub-therapeutic at 1.2 after just one dose of warfarin. H&H is low but stable at 9.6/30, plts wnl at 163. Renal function seems to be hovering around a Cr of 2-2.3. Patient is on DAPT.   Goal  of Therapy:  INR 2-3 Monitor platelets by anticoagulation protocol: Yes   Plan:  Coumadin 4 mg PO x1 Daily INR  Monitor for bleeding   Thank you,   Eddie Candle, PharmD PGY-1 Pharmacy Resident   Please check amion for clinical pharmacist contact number

## 2019-09-25 NOTE — Progress Notes (Signed)
Progress Note  Patient Name: Justin Boone Date of Encounter: 09/25/2019  Primary Cardiologist: Posey Boyer, MD   Subjective   Having tenderness from the pressure dressing, but otherwise doing well. Feels better now that heart rates are well controlled. Breathing is stable.  Inpatient Medications    Scheduled Meds: . amiodarone  400 mg Oral Q8H  . aspirin  81 mg Oral Daily  . budesonide (PULMICORT) nebulizer solution  0.25 mg Nebulization BID  . cephALEXin  500 mg Oral Q8H  . clopidogrel  75 mg Oral Daily  . DULoxetine  30 mg Oral Daily  . feeding supplement (GLUCERNA SHAKE)  237 mL Oral TID BM  . furosemide  80 mg Oral BID  . insulin aspart  0-9 Units Subcutaneous TID WC  . lidocaine-EPINEPHrine  20 mL Intradermal Once  . metoprolol tartrate  25 mg Oral Q8H  . multivitamin with minerals  1 tablet Oral Daily  . tamsulosin  0.4 mg Oral QPC breakfast  . warfarin  4 mg Oral ONCE-1600  . Warfarin - Pharmacist Dosing Inpatient   Does not apply q1600   Continuous Infusions:  PRN Meds: acetaminophen **OR** acetaminophen, HYDROcodone-acetaminophen, morphine injection, ondansetron (ZOFRAN) IV   Vital Signs    Vitals:   09/25/19 0837 09/25/19 1149 09/25/19 1200 09/25/19 1300  BP:  108/80 125/70   Pulse:   73 94  Resp:  18    Temp:  98.5 F (36.9 C)    TempSrc:  Oral    SpO2: 100%  99% 94%  Weight:      Height:        Intake/Output Summary (Last 24 hours) at 09/25/2019 1401 Last data filed at 09/25/2019 1130 Gross per 24 hour  Intake 1177 ml  Output 3125 ml  Net -1948 ml   Last 3 Weights 09/25/2019 09/24/2019 09/23/2019  Weight (lbs) 214 lb 217 lb 220 lb  Weight (kg) 97.07 kg 98.431 kg 99.791 kg      Telemetry    Atrial flutter in the 80s-90s - Personally Reviewed  ECG    4/15 atrial flutter - Personally Reviewed  Physical Exam   GEN: Well nourished, well developed in no acute distress HEENT: Normal, moist mucous membranes NECK: No JVD visible  today CARDIAC: largely regular rhythm, normal S1 and S2, no rubs or gallops. 2/6 SE murmur. Pressure dressing over pacemaker site VASCULAR: Radial pulses 2+ bilaterally. RESPIRATORY:  Clear to auscultation, mild expiratory wheeze ABDOMEN: Soft, non-tender, non-distended MUSCULOSKELETAL:  Left leg in immobilizer SKIN: Warm and dry, trace LE edema NEUROLOGIC:  Alert and oriented x 3. No focal neuro deficits noted. PSYCHIATRIC:  Normal affect   Labs    High Sensitivity Troponin:  No results for input(s): TROPONINIHS in the last 720 hours.    Chemistry Recent Labs  Lab 09/23/19 0601 09/24/19 0421 09/25/19 0451  NA 142 142 142  K 4.8 3.8 3.5  CL 104 102 100  CO2 23 29 33*  GLUCOSE 122* 113* 102*  BUN 56* 59* 52*  CREATININE 2.04* 2.05* 2.27*  CALCIUM 8.5* 8.5* 8.4*  GFRNONAA 33* 33* 29*  GFRAA 38* 38* 33*  ANIONGAP 15 11 9      Hematology Recent Labs  Lab 09/23/19 0601 09/24/19 0421 09/25/19 0451  WBC 10.7* 8.5 9.5  RBC 3.25* 3.30* 3.23*  HGB 9.4* 9.5* 9.6*  HCT 30.5* 30.4* 30.0*  MCV 93.8 92.1 92.9  MCH 28.9 28.8 29.7  MCHC 30.8 31.3 32.0  RDW 21.7* 21.6* 21.8*  PLT 149*  137* 163    BNPNo results for input(s): BNP, PROBNP in the last 168 hours.   DDimer No results for input(s): DDIMER in the last 168 hours.   Radiology    No results found.  Cardiac Studies   Echo 09/17/19 1. Left ventricular ejection fraction, by estimation, is 40 to 45%. The  left ventricle has mildly decreased function. The left ventricle  demonstrates global hypokinesis. The left ventricular internal cavity size  was mildly dilated. Left ventricular  diastolic parameters are consistent with Grade II diastolic dysfunction  (pseudonormalization). Elevated left atrial pressure.  2. Right ventricular systolic function is mildly reduced. The right  ventricular size is mildly enlarged. There is moderately elevated  pulmonary artery systolic pressure.  3. Left atrial size was mildly  dilated.  4. Right atrial size was mildly dilated.  5. The mitral valve is normal in structure. Mild mitral valve  regurgitation.  6. The aortic valve is tricuspid. Aortic valve regurgitation is mild.  Severe aortic valve stenosis.  7. The inferior vena cava is dilated in size with <50% respiratory  variability, suggesting right atrial pressure of 15 mmHg.   Patient Profile     69 y.o. male with known severe AS, planned for TAVR at [facility] 09/22/19, paroxysmal atrial fibrillation, chronic systolic dysfunction with EF 40%, CAD with DES to RCA 08/13/19, hypertension, type II diabetes, chronic kidney disease who was admitted/for whom we are consulted for symptomatic bradycardia and possible syncope.  Assessment & Plan    Severe aortic stenosis, presumed low flow-low gradient given EF 40%: -initially planned for TAVR 09/22/19 at [facility] This has been placed on hold per the patient given his current admission. He is debating if he should have an evaluation with the [facility] team, since he has had his admission here. I told him we are happy to help either way, whether he elects to pursue at [facility] or here.   Chronic systolic heart failure: -weight up from baseline but down from yesterday. Noted as 86.2 kg on admission, currently 97.1 kg -first several days of admission without well charted ins/outs, so difficult to assess cumulative volume balance -has chronic kidney disease, monitor Cr. Cr 2.27 today, up slightly from yesterday -changing to oral diuretics today, furosemide 80 mg PO BID  Symptomatic bradycardia: -now s/p BiV PPM 09/22/19  Paroxysmal atrial fibrillation: now in flutter per device interrogation -metoprolol, amiodarone restarted per EP recommendations now that pacemaker in place. Much improved rate control -appreciate EP's guidance. Recommended starting coumadin for gradual increase in INR over the weekend, to monitor pocket and leg. If he is able to be therapeutic, can then pursue TEE-CV. Can  switch back to DOAC in the future once stable. This seems like a good option to me, and patient is agreeable. -CHA2DS2/VAS Stroke Risk Points=3 (age, HF, CAD)  CAD with recent DES 08/13/19: -when anticoagulation therapeutic, drop aspirin and continue clopidogrel and DOAC  For questions or updates, please contact [place] Please consult [email or web address] for contact info under    Signed, Buford Dresser, MD  09/25/2019, 2:01 PM

## 2019-09-26 DIAGNOSIS — I350 Nonrheumatic aortic (valve) stenosis: Secondary | ICD-10-CM | POA: Diagnosis not present

## 2019-09-26 DIAGNOSIS — I5022 Chronic systolic (congestive) heart failure: Secondary | ICD-10-CM | POA: Diagnosis not present

## 2019-09-26 DIAGNOSIS — I480 Paroxysmal atrial fibrillation: Secondary | ICD-10-CM | POA: Diagnosis not present

## 2019-09-26 DIAGNOSIS — I2510 Atherosclerotic heart disease of native coronary artery without angina pectoris: Secondary | ICD-10-CM | POA: Diagnosis not present

## 2019-09-26 LAB — GLUCOSE, CAPILLARY
Glucose-Capillary: 100 mg/dL — ABNORMAL HIGH (ref 70–99)
Glucose-Capillary: 122 mg/dL — ABNORMAL HIGH (ref 70–99)
Glucose-Capillary: 162 mg/dL — ABNORMAL HIGH (ref 70–99)
Glucose-Capillary: 88 mg/dL (ref 70–99)

## 2019-09-26 LAB — BASIC METABOLIC PANEL
Anion gap: 10 (ref 5–15)
BUN: 54 mg/dL — ABNORMAL HIGH (ref 8–23)
CO2: 31 mmol/L (ref 22–32)
Calcium: 8.5 mg/dL — ABNORMAL LOW (ref 8.9–10.3)
Chloride: 98 mmol/L (ref 98–111)
Creatinine, Ser: 2.08 mg/dL — ABNORMAL HIGH (ref 0.61–1.24)
GFR calc Af Amer: 37 mL/min — ABNORMAL LOW (ref >60)
GFR calc non Af Amer: 32 mL/min — ABNORMAL LOW (ref >60)
Glucose, Bld: 115 mg/dL — ABNORMAL HIGH (ref 70–99)
Potassium: 4 mmol/L (ref 3.5–5.1)
Sodium: 139 mmol/L (ref 135–145)

## 2019-09-26 LAB — PROTIME-INR
INR: 1.4 — ABNORMAL HIGH (ref 0.8–1.2)
Prothrombin Time: 16.8 seconds — ABNORMAL HIGH (ref 11.4–15.2)

## 2019-09-26 LAB — MAGNESIUM: Magnesium: 2.5 mg/dL — ABNORMAL HIGH (ref 1.7–2.4)

## 2019-09-26 MED ORDER — WARFARIN SODIUM 3 MG PO TABS
3.0000 mg | ORAL_TABLET | Freq: Once | ORAL | Status: AC
  Administered 2019-09-26: 3 mg via ORAL
  Filled 2019-09-26: qty 1

## 2019-09-26 MED ORDER — HYDROMORPHONE HCL 1 MG/ML IJ SOLN
1.0000 mg | Freq: Once | INTRAMUSCULAR | Status: AC
  Administered 2019-09-26: 1 mg via INTRAVENOUS
  Filled 2019-09-26: qty 1

## 2019-09-26 MED ORDER — DULOXETINE HCL 60 MG PO CPEP
60.0000 mg | ORAL_CAPSULE | Freq: Every day | ORAL | Status: DC
  Administered 2019-09-27 – 2019-09-28 (×2): 60 mg via ORAL
  Filled 2019-09-26 (×2): qty 1

## 2019-09-26 NOTE — Plan of Care (Signed)
  Problem: Activity: Goal: Ability to tolerate increased activity will improve Outcome: Progressing   Problem: Pain Managment: Goal: General experience of comfort will improve Outcome: Progressing   

## 2019-09-26 NOTE — Progress Notes (Signed)
Progress Note  Patient Name: Justin Boone Date of Encounter: 09/26/2019  Primary Cardiologist: Posey Boyer, MD   Subjective   Not sleeping well, continues to have sweats whenever he sleeps. Hasn't improved with changing the duloxetine dose. Pain at pressure site/PPM still very tender, limits him from sleeping. No chest pain. Breathing is stable.  Inpatient Medications    Scheduled Meds: . amiodarone  400 mg Oral Q8H  . aspirin  81 mg Oral Daily  . budesonide (PULMICORT) nebulizer solution  0.25 mg Nebulization BID  . clopidogrel  75 mg Oral Daily  . [START ON 09/27/2019] DULoxetine  60 mg Oral Daily  . feeding supplement (GLUCERNA SHAKE)  237 mL Oral TID BM  . furosemide  80 mg Oral BID  .  HYDROmorphone (DILAUDID) injection  1 mg Intravenous Once  . insulin aspart  0-9 Units Subcutaneous TID WC  . lidocaine-EPINEPHrine  20 mL Intradermal Once  . metoprolol tartrate  25 mg Oral Q8H  . multivitamin with minerals  1 tablet Oral Daily  . tamsulosin  0.4 mg Oral QPC breakfast  . warfarin  3 mg Oral ONCE-1600  . Warfarin - Pharmacist Dosing Inpatient   Does not apply q1600   Continuous Infusions:  PRN Meds: acetaminophen **OR** acetaminophen, HYDROcodone-acetaminophen, morphine injection, ondansetron (ZOFRAN) IV   Vital Signs    Vitals:   09/26/19 0726 09/26/19 0800 09/26/19 0811 09/26/19 0845  BP:  111/68    Pulse:  (!) 105    Resp:   16   Temp: 98.7 F (37.1 C)  99.9 F (37.7 C)   TempSrc: Oral  Oral   SpO2:  100%  98%  Weight:      Height:        Intake/Output Summary (Last 24 hours) at 09/26/2019 1116 Last data filed at 09/26/2019 0800 Gross per 24 hour  Intake 1777 ml  Output 2450 ml  Net -673 ml   Last 3 Weights 09/26/2019 09/26/2019 09/25/2019  Weight (lbs) 207 lb (No Data) 214 lb  Weight (kg) 93.895 kg (No Data) 97.07 kg      Telemetry    Atrial flutter in the 80s-90s - Personally Reviewed  ECG    4/15 atrial flutter - Personally  Reviewed  Physical Exam   GEN: Well nourished, well developed in no acute distress HEENT: Normal, moist mucous membranes NECK: No JVD visible today CARDIAC: largely regular rhythm, normal S1 and S2, no rubs or gallops. 2/6 SE murmur. Pressure dressing over pacemaker site VASCULAR: Radial pulses 2+ bilaterally. RESPIRATORY:  Clear to auscultation, mild expiratory wheeze ABDOMEN: Soft, non-tender, non-distended MUSCULOSKELETAL:  Left leg in immobilizer SKIN: Warm and dry, trace LE edema NEUROLOGIC:  Alert and oriented x 3. No focal neuro deficits noted. PSYCHIATRIC:  Normal affect   Labs    High Sensitivity Troponin:  No results for input(s): TROPONINIHS in the last 720 hours.    Chemistry Recent Labs  Lab 09/24/19 0421 09/25/19 0451 09/26/19 0411  NA 142 142 139  K 3.8 3.5 4.0  CL 102 100 98  CO2 29 33* 31  GLUCOSE 113* 102* 115*  BUN 59* 52* 54*  CREATININE 2.05* 2.27* 2.08*  CALCIUM 8.5* 8.4* 8.5*  GFRNONAA 33* 29* 32*  GFRAA 38* 33* 37*  ANIONGAP 11 9 10      Hematology Recent Labs  Lab 09/23/19 0601 09/24/19 0421 09/25/19 0451  WBC 10.7* 8.5 9.5  RBC 3.25* 3.30* 3.23*  HGB 9.4* 9.5* 9.6*  HCT 30.5* 30.4* 30.0*  MCV 93.8 92.1 92.9  MCH 28.9 28.8 29.7  MCHC 30.8 31.3 32.0  RDW 21.7* 21.6* 21.8*  PLT 149* 137* 163    BNPNo results for input(s): BNP, PROBNP in the last 168 hours.   DDimer No results for input(s): DDIMER in the last 168 hours.   Radiology    No results found.  Cardiac Studies   Echo 09/17/19 1. Left ventricular ejection fraction, by estimation, is 40 to 45%. The  left ventricle has mildly decreased function. The left ventricle  demonstrates global hypokinesis. The left ventricular internal cavity size  was mildly dilated. Left ventricular  diastolic parameters are consistent with Grade II diastolic dysfunction  (pseudonormalization). Elevated left atrial pressure.  2. Right ventricular systolic function is mildly reduced. The right   ventricular size is mildly enlarged. There is moderately elevated  pulmonary artery systolic pressure.  3. Left atrial size was mildly dilated.  4. Right atrial size was mildly dilated.  5. The mitral valve is normal in structure. Mild mitral valve  regurgitation.  6. The aortic valve is tricuspid. Aortic valve regurgitation is mild.  Severe aortic valve stenosis.  7. The inferior vena cava is dilated in size with <50% respiratory  variability, suggesting right atrial pressure of 15 mmHg.   Patient Profile     69 y.o. male with known severe AS, planned for TAVR at [facility] 09/22/19, paroxysmal atrial fibrillation, chronic systolic dysfunction with EF 40%, CAD with DES to RCA 08/13/19, hypertension, type II diabetes, chronic kidney disease who was admitted/for whom we are consulted for symptomatic bradycardia and possible syncope.  Assessment & Plan    Severe aortic stenosis, presumed low flow-low gradient given EF 40%: -initially planned for TAVR 09/22/19 at [facility]. This has been placed on hold per the patient given his current admission. He is debating if he should have an evaluation with the [facility] team, since he has had his admission here. I told him we are happy to help either way, whether he elects to pursue at [facility] or here.   Chronic systolic heart failure: -weight up from baseline but down from yesterday. Noted as 86.2 kg on admission, currently 93.9 kg -first several days of admission without well charted ins/outs, so difficult to assess cumulative volume balance -has chronic kidney disease, monitor Cr. 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 Will give one time IV dose dilaudid to get him some relief and allow him to sleep  Night sweats: initially trialed decreasing duloxetine, but this did not change his symptoms. He feels  worse overall today, will increase back up. If these persist, may need additional workup per PCP for further evaluation. No other clear B symptoms.  Paroxysmal atrial fibrillation, with flutter per device interrogation -CHA2DS2/VAS Stroke Risk Points=3 (age, HF, CAD) -metoprolol, amiodarone restarted per EP recommendations now that pacemaker in place. Much improved rate control -appreciate EP's guidance. Recommended starting coumadin for gradual increase in INR over the weekend, to monitor pocket and leg. If he is able to be therapeutic, can then pursue TEE-CV. Can switch back to DOAC in the future once stable. Now that he is paced and rate controlled, question if he still needs TEE-CV. If he stays paced, may be able to change to DOAC and follow up as outpatient -INR 1.4 today.   CAD with recent DES 08/13/19: -when anticoagulation therapeutic, drop aspirin and  continue clopidogrel and DOAC  For questions or updates, please contact [place] Please consult [email or web address] for contact info under    Signed, Buford Dresser, MD  09/26/2019, 11:16 AM

## 2019-09-26 NOTE — Progress Notes (Signed)
ANTICOAGULATION CONSULT NOTE - Initial Consult  Pharmacy Consult for coumadin Indication: atrial fibrillation  Allergies  Allergen Reactions  . Contrast Media [Iodinated Diagnostic Agents]   . Sulfa Antibiotics     Patient Measurements: Height: 6' (182.9 cm) Weight: 93.9 kg (207 lb) IBW/kg (Calculated) : 77.6 Heparin Dosing Weight:   Vital Signs: Temp: 99.9 F (37.7 C) (04/18 0811) Temp Source: Oral (04/18 0811) BP: 105/77 (04/18 0524) Pulse Rate: 95 (04/18 0524)  Labs: Recent Labs    09/24/19 0421 09/24/19 1442 09/25/19 0451 09/26/19 0411  HGB 9.5*  --  9.6*  --   HCT 30.4*  --  30.0*  --   PLT 137*  --  163  --   LABPROT  --  15.4* 15.1 16.8*  INR  --  1.2 1.2 1.4*  CREATININE 2.05*  --  2.27* 2.08*    Estimated Creatinine Clearance: 41 mL/min (A) (by C-G formula based on SCr of 2.08 mg/dL (H)).   Medical History: Past Medical History:  Diagnosis Date  . A-fib ([place])   . Bradycardia 09/17/2019  . CHF (congestive heart failure) ([place])   . Diabetes mellitus type II, controlled ([place])   . HTN (hypertension)     Medications:  Medications Prior to Admission  Medication Sig Dispense Refill Last Dose  . albuterol (PROVENTIL) (2.5 MG/3ML) 0.083% nebulizer solution Inhale 3 mLs into the lungs every 6 (six) hours as needed for shortness of breath or wheezing.   unk  . allopurinol (ZYLOPRIM) 300 MG tablet Take 300 mg by mouth daily.   09/16/2019 at Unknown time  . amiodarone (PACERONE) 200 MG tablet Take 200 mg by mouth daily.   09/16/2019 at Unknown time  . atorvastatin (LIPITOR) 80 MG tablet Take 80 mg by mouth daily.   09/16/2019 at Unknown time  . clopidogrel (PLAVIX) 75 MG tablet Take 75 mg by mouth daily.   09/16/2019 at 0900  . DULoxetine (CYMBALTA) 60 MG capsule Take 60 mg by mouth daily.   09/16/2019 at Unknown time  . ELIQUIS 5 MG TABS tablet Take 5 mg by mouth 2 (two) times daily.   09/16/2019 at 0900  . metoprolol succinate (TOPROL-XL) 50 MG 24 hr tablet Take 50 mg  by mouth daily.   09/16/2019 at 0900  . tamsulosin (FLOMAX) 0.4 MG CAPS capsule Take 0.4 mg by mouth daily.   09/16/2019 at Unknown time  . torsemide (DEMADEX) 20 MG tablet Take 20 mg by mouth daily.   09/16/2019 at Unknown time  . TRELEGY ELLIPTA 100-62.5-25 MCG/INH AEPB Inhale 1 puff into the lungs daily.   09/16/2019 at Unknown time   Scheduled:  . amiodarone  400 mg Oral Q8H  . aspirin  81 mg Oral Daily  . budesonide (PULMICORT) nebulizer solution  0.25 mg Nebulization BID  . clopidogrel  75 mg Oral Daily  . DULoxetine  30 mg Oral Daily  . feeding supplement (GLUCERNA SHAKE)  237 mL Oral TID BM  . furosemide  80 mg Oral BID  . insulin aspart  0-9 Units Subcutaneous TID WC  . lidocaine-EPINEPHrine  20 mL Intradermal Once  . metoprolol tartrate  25 mg Oral Q8H  . multivitamin with minerals  1 tablet Oral Daily  . tamsulosin  0.4 mg Oral QPC breakfast  . Warfarin - Pharmacist Dosing Inpatient   Does not apply q1600   Infusions:    Assessment: 50 yom admitted after fall and knee laceration. He got a pacer implanted on 4/14. He was on Eliquis  PTA for Afib (CHADSVASc 5). The INR was elevated on admission at 2.4. He received 10mg  of vit K and KCentra 4/9. He has had some bleeding from the pocket site so Coumadin will be used for now instead of the DOAC. Plans for TEE with DCCV next week once INR therapeutic. We are asked to dose coumadin and let INR trend up non-aggressively. Please note that amiodarone may have a delayed response in increasing warfarin's effect on INR. Patient is on DAPT. Cards plans to transition back to Eliquis after DCCV.  INR this morning is sub-therapeutic but increased at 1.4. No CBC today but H&H has been low but stable at 9.6/30, plts wnl at 163. Renal function seems to be hovering around a Cr of 2-2.3. Some abrasions and ecchymosis noted, but per RN no bleeding.   Goal of Therapy:  INR 2-3 Monitor platelets by anticoagulation protocol: Yes   Plan:  Coumadin 3 mg PO  x1 Daily INR  Monitor for bleeding   Thank you,   Eddie Candle, PharmD PGY-1 Pharmacy Resident   Please check amion for clinical pharmacist contact number

## 2019-09-26 NOTE — Progress Notes (Signed)
Patient found resting comfortably on home CPAP.

## 2019-09-26 NOTE — Progress Notes (Signed)
PROGRESS NOTE  Justin Boone XBD:[id number] DOB: 12-19-1950 DOA: 09/16/2019 PCP: Patient, No Pcp Per  Brief Narrative:  69 year old gentleman prior history of atrial fibrillation on Eliquis, diabetes mellitus, hypertension, COPD, obstructive sleep apnea on CPAP, ongoing tobacco use stage IIIb CKD, atrial fibrillation/flutter s/p cardioversion on amiodarone and Eliquis, chronic systolic heart failure, recent left heart catheterization in the setting of TAVR work-up on 08/12/2019, s/p drug-eluting stent to the LAD on Plavix and Eliquis presents to ED after a fall and left knee laceration.  X-rays of the left knee shows mild to moderate amount of infrapatellar soft tissue swelling and soft tissue air without evidence of acute abnormality.  Due to persistent bleeding orthopedics consulted and suggested to continue with compression wrapping and no indication of urgent washout of the knee in the OR.  Patient started bleeding profusely and persistently from the laceration soaking his dressing, bandages in the bed.  Patient's hemoglobin has dropped from 9.9-7.8 the last 24 hours from profuse bleeding from the left knee laceration.  Discussed with pharmacy regarding Andexxa suggested that this is not a life-threatening bleeding and his last Eliquis dose was on the morning of 09/16/2019, he is out of the window and has been 24 hours since her last Eliquis dose.  He was given  K Centra and vitamin K respiratory anticoagulation antidote protocol. His bleeding has stopped since then and Orthopedics recommended follow-up for left knee laceration evaluation and dressing change in 1 to 2 weeks. Called his cardiology office at [facility], who notified me that they are not going ahead with TAVR at this time as his aortic stenosis is only moderate. 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 Therapy evaluations recommending SNF. TOC on board for placement.     HPI/Recap of past 24 hours:  He  is status post pacemaker placement on 4/14, has some issues with pocksite bleeding with a pressure dressing on, he is complaining of associated pain  Currently paced rhythm, heart rate  70-80's, bp low normal Coumadin started on 4/16, inr today is 1.4 He denies chest pain, no short of breath, lower extremity edema has resolved 1.8liter urine output last 24hrs   Assessment/Plan: Principal Problem:   Symptomatic bradycardia Active Problems:   Paroxysmal atrial fibrillation (HCC)   QT prolongation   COPD with acute exacerbation (HCC)   AKI (acute kidney injury) ([place])   Knee laceration, left, initial encounter   Chronic systolic heart failure (HCC)   Severe aortic stenosis   CAD (coronary artery disease)   S/P primary angioplasty with coronary stent   Left knee laceration with profuse and persistent bleeding/acute blood loss anemia on admission  After syncope episode  -Bleeding has been controlled with compression bandages which have been changed again by orthopedics.   1 dose of Kcentra given on admission and bleeding stopped.   -Was on IV antibiotics then oral Keflex,  completed the 10-day course abx, last dose on 4/18 -Orthopedics recommends weightbearing as tolerated on the left lower extremity and to keep the left knee extended and straight for 2 weeks   -elevate extremity and encourage ankle flexion and extension to facilitate movement. Okay to use knee immobilizer for comfort/compliance of knee extension.  - follow-up with orthopedics Dr Griffin Basil in 1 to 2 weeks for laceration evaluation and dressing changes.        PAF with tachy/brady syndrome/syncope  s/p pacemaker placement on April 14, started on amiodarone Issues with  pocket site bleeding with pressure  dressing on Per cardiology, plan to start coumadin and for possible cardioversion next week, today he is rate controlled Will follow cardiology recommendation   Acute on Chronic systolic heart failure Echocardiogram  showed LVEF of 40 to 45%, with mildly decreased function. The left ventricle shows global hypokinesis. Left ventricular diastolic parameters are consistent with grade 2 diastolic dysfunction.  Negative 7.6liters, was on iv lasix, now on oral lasix 44m bid   strict intake and output and daily weights, edema has resolved  Will follow cardiology recommendation   AKI on stage III CKD Cr 3 on presentaion,  Ultrasound of the kidney did not show any hydronephrosis or obstruction. Creatinine around 2  Renal dosing meds.   Diet controlled dm2 a1c 6 A.m. blood glucose 102  COPD Tobacco abuse Counseled against tobacco use Wheezing has resolved, tapered off prednisone Weaned off oxygen.   Obstructive sleep apnea on CPAP at night continue the same.  FTT: PT recommend snf placement then cir, patient prefers to go home  DVT Prophylaxis: resume eliquis when ok with cardiology  Code Status: full  Family Communication: patient   Disposition Plan:    Patient came from:         home                                                                                                 Anticipated d/c place:  cir vs home with home health Barriers to d/c OR conditions which need to be met to effect a safe  Needs cardiology clearance    Consultants:  Cardiology/EP  Ortho  Critical care  Wound care  Procedures:  prbc transfusion  Pacemaker placement  Antibiotics:  Rocephin then Keflex   Objective: BP 111/68   Pulse (!) 105   Temp 99.9 F (37.7 C) (Oral)   Resp 16   Ht 6' (1.829 m)   Wt 93.9 kg   SpO2 98%   BMI 28.07 kg/m   Intake/Output Summary (Last 24 hours) at 09/26/2019 1032 Last data filed at 09/26/2019 0800 Gross per 24 hour  Intake 1777 ml  Output 2450 ml  Net -673 ml   Filed Weights   09/24/19 0505 09/25/19 0050 09/26/19 0524  Weight: 98.4 kg 97.1 kg 93.9 kg    Exam: Patient is examined daily including today on 09/26/2019, exams remain the same as of  yesterday except that has changed    General:  NAD  Cardiovascular: paced rhytm  Respiratory: CTABL  Abdomen: Soft/ND/NT, positive BS  Musculoskeletal: left knee in immobolizer, lower extremity edema has resolved   Neuro: alert, oriented   Data Reviewed: Basic Metabolic Panel: Recent Labs  Lab 09/21/19 0646 09/21/19 0646 09/22/19 0502 09/23/19 0601 09/24/19 0421 09/25/19 0451 09/26/19 0411  NA 139   < > 143 142 142 142 139  K 4.3   < > 4.7 4.8 3.8 3.5 4.0  CL 100   < > 104 104 102 100 98  CO2 29   < > _0 33* 31  GLUCOSE 159*   < > 151* 122* 113* 102* 115*  BUN  45*   < > 47* 56* 59* 52* 54*  CREATININE 2.01*   < > 1.96* 2.04* 2.05* 2.27* 2.08*  CALCIUM 8.7*   < > 8.9 8.5* 8.5* 8.4* 8.5*  MG 2.3  --   --   --  2.3  --  2.5*   < > = values in this interval not displayed.   Liver Function Tests: No results for input(s): AST, ALT, ALKPHOS, BILITOT, PROT, ALBUMIN in the last 168 hours. No results for input(s): LIPASE, AMYLASE in the last 168 hours. No results for input(s): AMMONIA in the last 168 hours. CBC: Recent Labs  Lab 09/19/19 1223 09/19/19 1814 09/20/19 0533 09/20/19 1202 09/21/19 0646 09/22/19 0502 09/23/19 0601 09/24/19 0421 09/25/19 0451  WBC 9.4  --  8.7  --   --  9.6 10.7* 8.5 9.5  NEUTROABS 8.1*  --   --   --   --   --   --   --   --   HGB 9.1*   < > 9.0*   < > 9.6* 9.5* 9.4* 9.5* 9.6*  HCT 28.8*   < > 28.4*   < > 30.9* 30.5* 30.5* 30.4* 30.0*  MCV 90.3  --  90.2  --   --  93.0 93.8 92.1 92.9  PLT 97*  --  124*  --   --  131* 149* 137* 163   < > = values in this interval not displayed.   Cardiac Enzymes:   No results for input(s): CKTOTAL, CKMB, CKMBINDEX, TROPONINI in the last 168 hours. BNP (last 3 results) No results for input(s): BNP in the last 8760 hours.  ProBNP (last 3 results) No results for input(s): PROBNP in the last 8760 hours.  CBG: Recent Labs  Lab 09/25/19 0621 09/25/19 1146 09/25/19 1632 09/25/19 2134  09/26/19 0622  GLUCAP 96 149* 169* 125* 100*    Recent Results (from the past 240 hour(s))  Respiratory Panel by RT PCR (Flu A&B, Covid) - Nasopharyngeal Swab     Status: None   Collection Time: 09/16/19  8:24 PM   Specimen: Nasopharyngeal Swab  Result Value Ref Range Status   SARS Coronavirus 2 by RT PCR NEGATIVE NEGATIVE Final    Comment: (NOTE) SARS-CoV-2 target nucleic acids are NOT DETECTED. The SARS-CoV-2 RNA is generally detectable in upper respiratoy specimens during the acute phase of infection. The lowest concentration of SARS-CoV-2 viral copies this assay can detect is 131 copies/mL. A negative result does not preclude SARS-Cov-2 infection and should not be used as the sole basis for treatment or other patient management decisions. A negative result may occur with  improper specimen collection/handling, submission of specimen other than nasopharyngeal swab, presence of viral mutation(s) within the areas targeted by this assay, and inadequate number of viral copies (<131 copies/mL). A negative result must be combined with clinical observations, patient history, and epidemiological information. The expected result is Negative. Fact Sheet for Patients:  PinkCheek.be Fact Sheet for Healthcare Providers:  GravelBags.it This test is not yet ap proved or cleared by the [place] FDA and  has been authorized for detection and/or diagnosis of SARS-CoV-2 by FDA under an Emergency Use Authorization (EUA). This EUA will remain  in effect (meaning this test can be used) for the duration of the COVID-19 declaration under Section 564(b)(1) of the Act, 21 U.S.C. section 360bbb-3(b)(1), unless the authorization is terminated or revoked sooner.    Influenza A by PCR NEGATIVE NEGATIVE Final   Influenza B by PCR  NEGATIVE NEGATIVE Final    Comment: (NOTE) The Xpert Xpress SARS-CoV-2/FLU/RSV assay is intended as an aid in  the  diagnosis of influenza from Nasopharyngeal swab specimens and  should not be used as a sole basis for treatment. Nasal washings and  aspirates are unacceptable for Xpert Xpress SARS-CoV-2/FLU/RSV  testing. Fact Sheet for Patients: PinkCheek.be Fact Sheet for Healthcare Providers: GravelBags.it This test is not yet approved or cleared by the [place] FDA and  has been authorized for detection and/or diagnosis of SARS-CoV-2 by  FDA under an Emergency Use Authorization (EUA). This EUA will remain  in effect (meaning this test can be used) for the duration of the  Covid-19 declaration under Section 564(b)(1) of the Act, 21  U.S.C. section 360bbb-3(b)(1), unless the authorization is  terminated or revoked. Performed at [facility], [place] [address], [place], [place] [address]   Surgical PCR screen     Status: None   Collection Time: 09/21/19 10:17 PM   Specimen: Nasal Mucosa; Nasal Swab  Result Value Ref Range Status   MRSA, PCR NEGATIVE NEGATIVE Final   Staphylococcus aureus NEGATIVE NEGATIVE Final    Comment: (NOTE) The Xpert SA Assay (FDA approved for NASAL specimens in patients 57 years of age and older), is one component of a comprehensive surveillance program. It is not intended to diagnose infection nor to guide or monitor treatment. Performed at [facility], [place] [address], [place],  [address]      Studies: No results found.  Scheduled Meds: . amiodarone  400 mg Oral Q8H  . aspirin  81 mg Oral Daily  . budesonide (PULMICORT) nebulizer solution  0.25 mg Nebulization BID  . clopidogrel  75 mg Oral Daily  . DULoxetine  30 mg Oral Daily  . feeding supplement (GLUCERNA SHAKE)  237 mL Oral TID BM  . furosemide  80 mg Oral BID  . insulin aspart  0-9 Units Subcutaneous TID WC  . lidocaine-EPINEPHrine  20 mL Intradermal Once  . metoprolol tartrate  25 mg Oral Q8H  . multivitamin with minerals  1  tablet Oral Daily  . tamsulosin  0.4 mg Oral QPC breakfast  . warfarin  3 mg Oral ONCE-1600  . Warfarin - Pharmacist Dosing Inpatient   Does not apply q1600    Continuous Infusions:    Time spent: 59mns I have personally reviewed and interpreted on  09/26/2019 daily labs, tele strips, imagings as discussed above under date review session and assessment and plans.  I reviewed all nursing notes, pharmacy notes, consultant notes,  vitals, pertinent old records  I have discussed plan of care as described above with RN , patient  on 09/26/2019   FFlorencia ReasonsMD, PhD, FACP  [facility]  Available via Epic secure chat 7am-7pm for nonurgent issues Please page for urgent issues, pager number available through [email or web address] .   09/26/2019, 10:32 AM  LOS: 10 days

## 2019-09-26 NOTE — Procedures (Signed)
Placed home CPAP within reach of patient.  He will place CPAP on when he is ready.

## 2019-09-27 LAB — CBC
HCT: 32.2 % — ABNORMAL LOW (ref 39.0–52.0)
Hemoglobin: 10 g/dL — ABNORMAL LOW (ref 13.0–17.0)
MCH: 29.2 pg (ref 26.0–34.0)
MCHC: 31.1 g/dL (ref 30.0–36.0)
MCV: 93.9 fL (ref 80.0–100.0)
Platelets: 151 10*3/uL (ref 150–400)
RBC: 3.43 MIL/uL — ABNORMAL LOW (ref 4.22–5.81)
RDW: 22 % — ABNORMAL HIGH (ref 11.5–15.5)
WBC: 12 10*3/uL — ABNORMAL HIGH (ref 4.0–10.5)
nRBC: 0 % (ref 0.0–0.2)

## 2019-09-27 LAB — BASIC METABOLIC PANEL
Anion gap: 15 (ref 5–15)
BUN: 54 mg/dL — ABNORMAL HIGH (ref 8–23)
CO2: 32 mmol/L (ref 22–32)
Calcium: 8.7 mg/dL — ABNORMAL LOW (ref 8.9–10.3)
Chloride: 94 mmol/L — ABNORMAL LOW (ref 98–111)
Creatinine, Ser: 2.48 mg/dL — ABNORMAL HIGH (ref 0.61–1.24)
GFR calc Af Amer: 30 mL/min — ABNORMAL LOW (ref >60)
GFR calc non Af Amer: 26 mL/min — ABNORMAL LOW (ref >60)
Glucose, Bld: 98 mg/dL (ref 70–99)
Potassium: 5.1 mmol/L (ref 3.5–5.1)
Sodium: 141 mmol/L (ref 135–145)

## 2019-09-27 LAB — GLUCOSE, CAPILLARY
Glucose-Capillary: 91 mg/dL (ref 70–99)
Glucose-Capillary: 194 mg/dL — ABNORMAL HIGH (ref 70–99)
Glucose-Capillary: 84 mg/dL (ref 70–99)
Glucose-Capillary: 98 mg/dL (ref 70–99)
Glucose-Capillary: 142 mg/dL — ABNORMAL HIGH (ref 70–99)

## 2019-09-27 LAB — PROTIME-INR
INR: 1.6 — ABNORMAL HIGH (ref 0.8–1.2)
Prothrombin Time: 19.3 seconds — ABNORMAL HIGH (ref 11.4–15.2)

## 2019-09-27 MED ORDER — WARFARIN SODIUM 3 MG PO TABS
3.0000 mg | ORAL_TABLET | Freq: Once | ORAL | Status: AC
  Administered 2019-09-27: 3 mg via ORAL
  Filled 2019-09-27: qty 1

## 2019-09-27 MED ORDER — AMIODARONE HCL 200 MG PO TABS
400.0000 mg | ORAL_TABLET | Freq: Two times a day (BID) | ORAL | Status: DC
  Administered 2019-09-27 – 2019-09-28 (×2): 400 mg via ORAL
  Filled 2019-09-27 (×2): qty 2

## 2019-09-27 MED ORDER — METOPROLOL SUCCINATE ER 50 MG PO TB24
50.0000 mg | ORAL_TABLET | Freq: Every day | ORAL | Status: DC
  Administered 2019-09-27 – 2019-09-28 (×2): 50 mg via ORAL
  Filled 2019-09-27 (×3): qty 1

## 2019-09-27 MED ORDER — FUROSEMIDE 80 MG PO TABS
80.0000 mg | ORAL_TABLET | Freq: Two times a day (BID) | ORAL | Status: DC
  Administered 2019-09-28 (×2): 80 mg via ORAL
  Filled 2019-09-27 (×2): qty 1

## 2019-09-27 NOTE — Progress Notes (Addendum)
ANTICOAGULATION CONSULT NOTE - Initial Consult  Pharmacy Consult for coumadin Indication: atrial fibrillation  Allergies  Allergen Reactions  . Contrast Media [Iodinated Diagnostic Agents]   . Sulfa Antibiotics     Patient Measurements: Height: 6' (182.9 cm) Weight: 93 kg (205 lb) IBW/kg (Calculated) : 77.6  Vital Signs: Temp: 98.1 F (36.7 C) (04/19 0956) Temp Source: Oral (04/19 0956) BP: 133/73 (04/19 0956) Pulse Rate: 61 (04/19 0956)  Labs: Recent Labs    09/25/19 0451 09/26/19 0411 09/27/19 0543  HGB 9.6*  --  10.0*  HCT 30.0*  --  32.2*  PLT 163  --  151  LABPROT 15.1 16.8* 19.3*  INR 1.2 1.4* 1.6*  CREATININE 2.27* 2.08* 2.48*    Estimated Creatinine Clearance: 31.7 mL/min (A) (by C-G formula based on SCr of 2.48 mg/dL (H)).   Medical History: Past Medical History:  Diagnosis Date  . A-fib ([place])   . Bradycardia 09/17/2019  . CHF (congestive heart failure) ([place])   . Diabetes mellitus type II, controlled ([place])   . HTN (hypertension)     Medications:  Scheduled:  . amiodarone  400 mg Oral Q8H  . aspirin  81 mg Oral Daily  . budesonide (PULMICORT) nebulizer solution  0.25 mg Nebulization BID  . clopidogrel  75 mg Oral Daily  . DULoxetine  60 mg Oral Daily  . feeding supplement (GLUCERNA SHAKE)  237 mL Oral TID BM  . furosemide  80 mg Oral BID  . insulin aspart  0-9 Units Subcutaneous TID WC  . lidocaine-EPINEPHrine  20 mL Intradermal Once  . metoprolol tartrate  25 mg Oral Q8H  . multivitamin with minerals  1 tablet Oral Daily  . tamsulosin  0.4 mg Oral QPC breakfast  . Warfarin - Pharmacist Dosing Inpatient   Does not apply q1600    Assessment: 42 yom admitted after fall and knee laceration. Patient on Eliquis prior to admission for atrial fibrillation (CHADSVASc 5).   Of note, patient had a pacer implanted on 4/14. Patient's INR was elevated on admission at 2.4. He received 10mg  of vit K and KCentra 4/9. He has had some bleeding from the  pocket site so Coumadin will be used for now instead of the PTA DOAC.   Pharmacy consulted to dose coumadin and let INR trend up non-aggressively. Please note that amiodarone may have a delayed response in increasing warfarin's effect on INR. Patient is on DAPT. Cards plans to transition back to Eliquis after possible DCCV.  INR this morning up to 1.6 from 1.4 yesterday. However, patient is refusing meals and eating <75%. No issues with bleeding reported per RN.   Goal of Therapy:  INR 2-3 Monitor platelets by anticoagulation protocol: Yes   Plan:  Coumadin (warfarin) 3 mg by mouth x1 tonight Daily INR  Monitor for bleeding      Bora Bost L. Devin Going, [place] PGY1 Pharmacy Resident [phone] 09/27/19      10:14 AM  Please check AMION for all [facility] phone numbers After 10:00 PM, call the [facility] [phone]

## 2019-09-27 NOTE — Progress Notes (Signed)
Patient's home CPAP has been set up and ready for use.

## 2019-09-27 NOTE — Progress Notes (Signed)
  Wound assessed with Dr. Caryl Comes.  Hematoma resolved. Ecchymosis present.  When pulling off pressure dressing, steri-strips came off as well. Pt has small lip on inner portion of wound.  Benzoin and steristrips re-applied. Large tegaderm placed over as barrier per Dr. Caryl Comes.   Wound check up in place for next week.   Bandage off (POST BENZOIN)       Legrand Como [address] [place], [place]  09/27/2019 11:05 AM

## 2019-09-27 NOTE — Progress Notes (Signed)
Physical Therapy Treatment Patient Details Name: Justin Boone MRN: [id number] DOB: 1950-07-09 Today's Date: 09/27/2019    History of Present Illness Pt is a 69 y/o male admitted following fall secondary to symptomatic bradycardia. Pt sustained L knee laceration that required sutures. PMH includes COPD, a fib, CKD, CHF, CAD s/p stent placement, OSA on CPAP, and tobacco use. Pacemaker placed 4/14.     PT Comments    Pt just received pain medication after removal of dressing over pacemaker surgical site. Medication possible explanation for pts overall lethargy like falling asleep during conversation and positional change, tangential conversations,  and decreased ability to participate today. Pt also with decrease in BP with mobility. Pt modAx2 for bed mobility, minA for sit>stand and modAx2 for ambulation bed to chair. Continue to recommend CIR level therapy given pt's complexity with mobility. PT will continue to follow acutely.  Orthostatic BPs  Supine 133/73  After initial stand  108/73  In standing  109/72  Seated in recliner after mobility 104/67     Follow Up Recommendations  CIR     Equipment Recommendations  3in1 (PT)    Recommendations for Other Services Rehab consult     Precautions / Restrictions Precautions Precautions: Fall;ICD/Pacemaker Precaution Comments: Must keep knee extended; no flexion  Required Braces or Orthoses: Knee Immobilizer - Left Knee Immobilizer - Left: Other (comment)(for comfort to keep knee extended) Restrictions Weight Bearing Restrictions: Yes LUE Weight Bearing: Partial weight bearing LUE Partial Weight Bearing Percentage or Pounds: no pulling due to pacemake placement LLE Weight Bearing: Weight bearing as tolerated    Mobility  Bed Mobility Overal bed mobility: Needs Assistance Bed Mobility: Supine to Sit     Supine to sit: +2 for physical assistance;Mod assist     General bed mobility comments: min A initially for pt to pull  against therapist modAx2 for scooting hips to EoB  Transfers Overall transfer level: Needs assistance Equipment used: 2 person hand held assist Transfers: Sit to/from Stand Sit to Stand: Min assist;+2 physical assistance         General transfer comment: min A for power up and steadying until pt could get L LE underneath him to balance  Ambulation/Gait Ambulation/Gait assistance: Min assist;+2 physical assistance Gait Distance (Feet): 5 Feet Assistive device: 1 person hand held assist Gait Pattern/deviations: Step-to pattern;Decreased step length - right;Decreased stance time - left;Antalgic Gait velocity: slowed Gait velocity interpretation: <1.31 ft/sec, indicative of household ambulator General Gait Details: min A for 1 person assist to step to chair         Balance Overall balance assessment: Needs assistance Sitting-balance support: No upper extremity supported;Feet supported Sitting balance-Leahy Scale: Fair     Standing balance support: Bilateral upper extremity supported;Single extremity supported;During functional activity Standing balance-Leahy Scale: Poor Standing balance comment: Single UE required for balance; pt with x1 LOB at sink requiring minA for steadying                             Cognition Arousal/Alertness: Awake/alert Behavior During Therapy: (P) WFL for tasks assessed/performed;Flat affect Overall Cognitive Status: (P) Impaired/Different from baseline                                        Exercises General Exercises - Lower Extremity Quad Sets: AROM;Left;5 reps;Supine Straight Leg Raises: AROM;Left;5 reps    General Comments  General comments (skin integrity, edema, etc.): Pt with multiple bouts of falling asleep/non resposive to verbal cues during session, including in standing,       Pertinent Vitals/Pain Pain Assessment: 0-10 Faces Pain Scale: Hurts worst Pain Location: L shoulder with movemement, and L  knee with weightbearing Pain Descriptors / Indicators: Grimacing;Guarding;Aching     PT Goals (current goals can now be found in the care plan section) Acute Rehab PT Goals Patient Stated Goal: to decrease pain  PT Goal Formulation: With patient Time For Goal Achievement: 10/04/19 Potential to Achieve Goals: Good Progress towards PT goals: Progressing toward goals    Frequency    Min 3X/week      PT Plan Discharge plan needs to be updated    Co-evaluation PT/OT/SLP Co-Evaluation/Treatment: Yes Reason for Co-Treatment: For patient/therapist safety;Complexity of the patient's impairments (multi-system involvement)          AM-PAC PT "6 Clicks" Mobility   Outcome Measure  Help needed turning from your back to your side while in a flat bed without using bedrails?: A Little Help needed moving from lying on your back to sitting on the side of a flat bed without using bedrails?: A Lot Help needed moving to and from a bed to a chair (including a wheelchair)?: A Lot Help needed standing up from a chair using your arms (e.g., wheelchair or bedside chair)?: A Lot Help needed to walk in hospital room?: A Lot Help needed climbing 3-5 steps with a railing? : Total 6 Click Score: 12    End of Session Equipment Utilized During Treatment: Gait belt Activity Tolerance: Patient limited by pain;Patient limited by fatigue Patient left: with call bell/phone within reach;in chair Nurse Communication: Mobility status;Other (comment)(KI use for OOB) PT Visit Diagnosis: Difficulty in walking, not elsewhere classified (R26.2);Pain Pain - Right/Left: Left Pain - part of body: Knee;Shoulder     Time: 9629-5284 PT Time Calculation (min) (ACUTE ONLY): 28 min  Charges:  $Therapeutic Activity: 8-22 mins                     Luis Nickles B. Migdalia Dk PT, DPT Acute Rehabilitation Services Pager [phone] Office [phone]    [place] 09/27/2019, 12:25 PM

## 2019-09-27 NOTE — Plan of Care (Signed)
  Problem: Activity: Goal: Ability to return to baseline activity level will improve Outcome: Progressing  Problem: Activity: Goal: Risk for activity intolerance will decrease Outcome: Progressing    Patient up to chair, denies SOB or increased pain.  Problem: Skin Integrity: Goal: Risk for impaired skin integrity will decrease Outcome: Progressing   Dermabond and tegaderm applied to left upper chest pacemaker site.

## 2019-09-27 NOTE — Progress Notes (Signed)
Occupational Therapy Treatment Patient Details Name: Justin Boone MRN: [id number] DOB: 08-Jan-1951 Today's Date: 09/27/2019    History of present illness Pt is a 69 y/o male admitted following fall secondary to symptomatic bradycardia. Pt sustained L knee laceration that required sutures. PMH includes COPD, a fib, CKD, CHF, CAD s/p stent placement, OSA on CPAP, and tobacco use. Pacemaker placed 4/14.    OT comments  Patient continues to make steady progress towards goals in skilled OT session. Patient's session encompassed functional transfers to further progress in mobility. Pt noted to be in increased pain in session, and engaging in tangential conversations that required cues to reorient during session. Pt noted to be increasingly impulsive and forgetting his pacemaker precautions, attempting to stand up from EOB, with LOB noted when attempting to stand too quickly. BP assessed in sitting (108/73) and standing (109/72) however due to increased pain and decreased activity tolerance, pt transferred to chair with hand held assist. Pt would continue to benefit from skilled services in order to address functional deficits; will continue to follow acutely.     Follow Up Recommendations  CIR    Equipment Recommendations  3 in 1 bedside commode;Wheelchair (measurements OT);Wheelchair cushion (measurements OT)    Recommendations for Other Services      Precautions / Restrictions Precautions Precautions: Fall;ICD/Pacemaker Precaution Comments: Must keep knee extended; no flexion  Required Braces or Orthoses: Knee Immobilizer - Left Knee Immobilizer - Left: Other (comment)(for comfort to keep knee extended) Restrictions Weight Bearing Restrictions: Yes LUE Weight Bearing: Partial weight bearing LUE Partial Weight Bearing Percentage or Pounds: no pulling due to pacemake placement LLE Weight Bearing: Weight bearing as tolerated       Mobility Bed Mobility Overal bed mobility: Needs  Assistance Bed Mobility: Supine to Sit     Supine to sit: +2 for physical assistance;Mod assist     General bed mobility comments: min A initially for pt to pull against therapist modAx2 for scooting hips to EoB  Transfers Overall transfer level: Needs assistance Equipment used: 2 person hand held assist Transfers: Sit to/from Stand Sit to Stand: Min assist;+2 physical assistance         General transfer comment: min A for power up and steadying until pt could get L LE underneath him to balance    Balance Overall balance assessment: Needs assistance Sitting-balance support: No upper extremity supported;Feet supported Sitting balance-Leahy Scale: Fair     Standing balance support: Bilateral upper extremity supported;Single extremity supported;During functional activity Standing balance-Leahy Scale: Poor Standing balance comment: Single UE required for balance; pt with x1 LOB at EOB requiring minA for steadying                           ADL either performed or assessed with clinical judgement   ADL Overall ADL's : Needs assistance/impaired                         Toilet Transfer: Minimal assistance;+2 for physical assistance;+2 for safety/equipment;Stand-pivot Toilet Transfer Details (indicate cue type and reason): Simulated with recliner         Functional mobility during ADLs: Minimal assistance;+2 for safety/equipment General ADL Comments: Pt with LOB and impulsivity when attempting to stand EOB, BP assesed in sitting and standing as pt noted to feel dizzy and minimally disoriented.Due to status and decreased activity tolerance, pt transfered to recliner however did not attempt ADLs at sink  Vision       Perception     Praxis      Cognition Arousal/Alertness: Lethargic;Suspect due to medications Behavior During Therapy: [facility] for tasks assessed/performed;Flat affect;Impulsive Overall Cognitive Status: Impaired/Different from  baseline Area of Impairment: Orientation;Attention;Following commands;Safety/judgement;Awareness                 Orientation Level: Disoriented to;Time Current Attention Level: Sustained   Following Commands: Follows one step commands inconsistently Safety/Judgement: Decreased awareness of safety;Decreased awareness of deficits Awareness: Emergent   General Comments: pt with tangential conversations today, talking about one subject and then an entirely different subject without transistion         Exercises Exercises: General Lower Extremity General Exercises - Lower Extremity Quad Sets: AROM;Left;5 reps;Supine Straight Leg Raises: AROM;Left;5 reps   Shoulder Instructions       General Comments BP prior to therapy 133/73 with mobility BP 104/67 Pt with multiple bouts of falling asleep/non resposive to verbal cues during session, including in standing, Pt with decreased BP with mobility     Pertinent Vitals/ Pain       Pain Assessment: Faces Faces Pain Scale: Hurts worst Pain Location: L shoulder with movemement, and L knee with weightbearing Pain Descriptors / Indicators: Grimacing;Guarding;Aching Pain Intervention(s): Limited activity within patient's tolerance;Monitored during session;Repositioned  Home Living                                          Prior Functioning/Environment              Frequency  Min 2X/week        Progress Toward Goals  OT Goals(current goals can now be found in the care plan section)  Progress towards OT goals: Progressing toward goals  Acute Rehab OT Goals Patient Stated Goal: to decrease pain  OT Goal Formulation: With patient Time For Goal Achievement: 10/04/19 Potential to Achieve Goals: Good  Plan Discharge plan remains appropriate    Co-evaluation    PT/OT/SLP Co-Evaluation/Treatment: Yes Reason for Co-Treatment: Complexity of the patient's impairments (multi-system involvement);For  patient/therapist safety;To address functional/ADL transfers   OT goals addressed during session: ADL's and self-care      AM-PAC OT "6 Clicks" Daily Activity     Outcome Measure   Help from another person eating meals?: None Help from another person taking care of personal grooming?: A Little Help from another person toileting, which includes using toliet, bedpan, or urinal?: A Little Help from another person bathing (including washing, rinsing, drying)?: A Lot Help from another person to put on and taking off regular upper body clothing?: A Little Help from another person to put on and taking off regular lower body clothing?: A Lot 6 Click Score: 17    End of Session Equipment Utilized During Treatment: Left knee immobilizer  OT Visit Diagnosis: Unsteadiness on feet (R26.81);Pain Pain - Right/Left: Left Pain - part of body: Shoulder   Activity Tolerance Patient tolerated treatment well   Patient Left in chair;with call bell/phone within reach;with chair alarm set   Nurse Communication Mobility status        Time: 7494-4967 OT Time Calculation (min): 28 min  Charges: OT General Charges $OT Visit: 1 Visit OT Treatments $Self Care/Home Management : 8-22 mins  Corinne Ports E. Raivyn Kabler, COTA/L Acute Rehabilitation Services [place] 09/27/2019, 2:16 PM

## 2019-09-27 NOTE — Progress Notes (Signed)
Spoke extensively to patient's son. Son is in favor of CIR as neither he nor his spouse are able to be home with patient 24/7 for a few days and they are very concerned about him falling again.  Son states he will talk to patient to see if he would be open to CIR.

## 2019-09-27 NOTE — Progress Notes (Addendum)
Electrophysiology Rounding Note  Patient Name: Justin Boone Date of Encounter: 09/27/2019  Primary Cardiologist: Posey Boyer, MD Electrophysiologist: Dr. Caryl Comes   Subjective   The patient is doing well today.  His pocket tenderness has improved. Pocket pal in place. Pt with arm propped up 90 degrees on edge of bed on my assessment. No new complaints just ready to go home.   Inpatient Medications    Scheduled Meds:  amiodarone  400 mg Oral Q8H   aspirin  81 mg Oral Daily   budesonide (PULMICORT) nebulizer solution  0.25 mg Nebulization BID   clopidogrel  75 mg Oral Daily   DULoxetine  60 mg Oral Daily   feeding supplement (GLUCERNA SHAKE)  237 mL Oral TID BM   furosemide  80 mg Oral BID   insulin aspart  0-9 Units Subcutaneous TID WC   lidocaine-EPINEPHrine  20 mL Intradermal Once   metoprolol tartrate  25 mg Oral Q8H   multivitamin with minerals  1 tablet Oral Daily   tamsulosin  0.4 mg Oral QPC breakfast   Warfarin - Pharmacist Dosing Inpatient   Does not apply q1600   Continuous Infusions:   PRN Meds: acetaminophen **OR** acetaminophen, HYDROcodone-acetaminophen, morphine injection, ondansetron (ZOFRAN) IV   Vital Signs    Vitals:   09/26/19 2146 09/27/19 0000 09/27/19 0110 09/27/19 0400  BP: (!) 116/100 139/78 139/78 (!) 150/80  Pulse: 61 63 63 64  Resp:   18 20  Temp:   (!) 97.5 F (36.4 C) 97.6 F (36.4 C)  TempSrc:   Oral Oral  SpO2:  98% 96% 95%  Weight:   93 kg   Height:        Intake/Output Summary (Last 24 hours) at 09/27/2019 0726 Last data filed at 09/27/2019 0434 Gross per 24 hour  Intake 1497 ml  Output 1575 ml  Net -78 ml   Filed Weights   09/25/19 0050 09/26/19 0524 09/27/19 0110  Weight: 97.1 kg 93.9 kg 93 kg    Physical Exam    GEN- The patient is well appearing, alert and oriented x 3 today.   Head- normocephalic, atraumatic Eyes-  Sclera clear, conjunctiva pink Ears- hearing intact Oropharynx- clear Neck- supple Lungs-  Clear to ausculation bilaterally, normal work of breathing Heart- Regular rate and rhythm, no murmurs, rubs or gallops. CRT site with bulky dressing and pocket pal in place.  GI- soft, NT, ND, + BS Extremities- no clubbing, cyanosis, or edema Skin- no rash or lesion Psych- euthymic mood, full affect Neuro- strength and sensation are intact  Labs    CBC Recent Labs    09/25/19 0451 09/27/19 0543  WBC 9.5 12.0*  HGB 9.6* 10.0*  HCT 30.0* 32.2*  MCV 92.9 93.9  PLT 163 500   Basic Metabolic Panel Recent Labs    09/26/19 0411 09/27/19 0543  NA 139 141  K 4.0 5.1  CL 98 94*  CO2 31 32  GLUCOSE 115* 98  BUN 54* 54*  CREATININE 2.08* 2.48*  CALCIUM 8.5* 8.7*  MG 2.5*  --    Liver Function Tests No results for input(s): AST, ALT, ALKPHOS, BILITOT, PROT, ALBUMIN in the last 72 hours. No results for input(s): LIPASE, AMYLASE in the last 72 hours. Cardiac Enzymes No results for input(s): CKTOTAL, CKMB, CKMBINDEX, TROPONINI in the last 72 hours.   Telemetry    Rhythm changed from irregular in 70-80s to V paced at 60 yesterday am ~ 0930; will check EKG to clarify rhythm (personally reviewed)  Radiology    No results found.  Patient Profile     69 y.o. male with a hx of COPD, OSA w/CPAP, HTN, DM, CKD (III), AFib/flutter, chronic CHF (systolic), VHD w/ mod MR and severe AS, CAD   Outpatient recent evaluation for TAVR at [facility] (was planned for today initially) though delayed given hs admission here.   Admitted to [facility] with recurrent syncope. Siffered L knee trauma with very large laceration associated with significant bleeding s/p KCentra and vitamin K and PRBC, required ortho to close wound   Found bradycardic 30's-50's home BB and amiodarone stopped with improvement in HR >> eventually unfortunately  AFib/flutter w/RVR He has been diuresed as well for volume OL  Assessment & Plan    1. Tachy-brady syndrome s/p CRT-P this admission Pocket improving. Will  assess pocket with Dr. Caryl Comes this am.  2. Paroxysmal AF flutter Pt now V pacing at 60 (was irregular 70-80s over weekend) with ? P waves. Will check EKG to clarify rhythm (if unable to determine will check device) He is now on coumadin for slow rise given significant bleeding into pocket and after knee injury. Plan to go back on DOAC once stable Will change lopressor to Toprol XL 50 mg daily for CHF.  Will decrease amiodarone to 400 mg BID now in NSR.    3. Acute on chronic CHF Echo 09/17/2019 LVEF 40-45% Now on po lasix.  Lopressor to Toprol as above.   4. CAD with DES 08/13/19 outside facility PCI last month Continue ASA/plavix. Denies chest pain  5. Severe AS Plan for TAVR at [facility] post hospitilazition  Will discuss plan further pending EKG and Dr. Aquilla Hacker assessment of PPM pocket.  ADDENDUM Device check. Pt AP/VP, NSR. Underlying is Sinus in 50s. Atrial threshold 1.25 V @ 0.5 ms. RV threshold 0.5V @ 0.5 ms.  For questions or updates, please contact [place] Please consult [email or web address] for contact info under Cardiology/STEMI.  Signed, Shirley Friar, PA-C  09/27/2019, 7:26 AM  Seen and examined   Pocket stable Euvolemic continue current meds Stable for discharge

## 2019-09-27 NOTE — Progress Notes (Addendum)
PROGRESS NOTE    Justin Boone  GLO:[id number] DOB: September 01, 1950 DOA: 09/16/2019 PCP: Patient, No Pcp Per   Brief Narrative: Patient is a 69 year old male with history of A. fib on anticoagulation with Eliquis, diabetes type 2, hypertension, COPD, OSA on CPAP, diabetes, CKD stage IIIb, chronic systolic heart failure, who presents to the emergency department for the evaluation of fall, left knee laceration.  X-rays of the left knee showed Moderate infrapatellar soft tissue swelling, soft tissue air without evidence of acute abnormality.  Hospital course remarkable for anemia from bleeding from the laceration site.  Hospital course also remarkable for tachybradycardia syndrome.  EP consulted.  Status post pacemaker placement.  Assessment & Plan:   Principal Problem:   Symptomatic bradycardia Active Problems:   Paroxysmal atrial fibrillation (HCC)   QT prolongation   COPD with acute exacerbation (HCC)   AKI (acute kidney injury) (HCC)   Knee laceration, left, initial encounter   Chronic systolic heart failure (HCC)   Severe aortic stenosis   CAD (coronary artery disease)   S/P primary angioplasty with coronary stent   Left knee laceration: Present on admission.  Hospital course remarkable for profuse/persistent bleeding from the laceration site causing acute blood loss anemia.  Bleeding controlled with compression bandages.  Orthopedics was following.  He also completed oral antibiotic therapy.  Orthopedics recommended weightbearing as tolerated on the left lower extremity, follow-up as an outpatient in 2 weeks. PT/OT recommending CIR.  A. fib with tachybradycardia syndrome/syncope: Status post pacemaker placement on 4/14.  On lopressor,amiodarone.  Issues with pocket site bleeding.  EP team following.  Currently on Coumadin for anticoagulation, plan is to switch to DOAC  Acute on chronic systolic heart failure: Echo showed ejection fraction of 40%-45%, global hypokinesis, grade 2 diastolic  dysfunction.  On Lasix 80 mg twice a day now.  Monitor input/output, daily weight.  Coronary artery disease: Status post DES on 3/21 at outside facility.  He said last month.  On aspirin, Plavix  AKI on stage IIIb CKD: Ultrasound of the kidneys did not show any hydronephrosis or obstruction.  Creatinine around 2.  Continue to monitor.  Diabetes type 2: A1c of 6 .  Controlled.  Continue current regimen  History of COPD/tobacco abuse: Counseled for tobacco cessation.  Previously wheezing now is resolved.  Prednisone tapered.  Weaned off oxygen.  OSA: CPAP at night.  Severe aortic stenosis: Plan for TAVR at [facility] after this hospitalization  Nutrition Problem: Increased nutrient needs Etiology: wound healing      DVT prophylaxis: Resume Eliquis when appropriate Code Status: Full code Family Communication: None Status is: Inpatient  Remains inpatient appropriate because:Unsafe d/c plan   Dispo: The patient is from: Home              Anticipated d/c is to: CAR versus home with home health              Anticipated d/c date is: 1 day              Patient currently is not medically stable to d/c. Needs cardiology clearance before discharging.  PT/OT recommending CIR.  Unstable discharge planning to home.  Will check with the son.        Consultants: Cardiology  Procedures: None  Antimicrobials:  Anti-infectives (From admission, onward)   Start     Dose/Rate Route Frequency Ordered Stop   09/23/19 1400  cephALEXin (KEFLEX) capsule 500 mg     500 mg Oral Every 8 hours 09/23/19 0913 09/26/19  0814   09/23/19 0647  ceFAZolin (ANCEF) IVPB 1 g/50 mL premix  Status:  Discontinued     1 g 100 mL/hr over 30 Minutes Intravenous Every 6 hours 09/23/19 0647 09/23/19 0650   09/22/19 1400  ceFAZolin (ANCEF) IVPB 2g/100 mL premix     2 g 200 mL/hr over 30 Minutes Intravenous Every 8 hours 09/22/19 1332 09/23/19 1359   09/22/19 0600  ceFAZolin (ANCEF) IVPB 2g/100 mL premix     2 g 200  mL/hr over 30 Minutes Intravenous On call 09/21/19 2035 09/22/19 1624   09/21/19 2130  gentamicin (GARAMYCIN) 80 mg in sodium chloride 0.9 % 500 mL irrigation     80 mg Irrigation On call 09/21/19 2035 09/22/19 1814   09/17/19 0600  ceFAZolin (ANCEF) IVPB 2g/100 mL premix  Status:  Discontinued     2 g 200 mL/hr over 30 Minutes Intravenous Every 12 hours 09/16/19 2356 09/22/19 1332   09/16/19 1830  ceFAZolin (ANCEF) IVPB 2g/100 mL premix     2 g 200 mL/hr over 30 Minutes Intravenous  Once 09/16/19 1828 09/16/19 1910      Subjective:  Patient seen and examined at the bedside this morning.  Medically stable.  Comfortable.  He says he feels great today.  Knee pain and pacemaker site pain have significantly improved. Eager to go home but PT/OT recommending CIR.  Objective: Vitals:   09/27/19 0110 09/27/19 0400 09/27/19 0811 09/27/19 0956  BP: 139/78 (!) 150/80  133/73  Pulse: 63 64  61  Resp: 18 20  18   Temp: (!) 97.5 F (36.4 C) 97.6 F (36.4 C)  98.1 F (36.7 C)  TempSrc: Oral Oral  Oral  SpO2: 96% 95% 94% 94%  Weight: 93 kg     Height:        Intake/Output Summary (Last 24 hours) at 09/27/2019 1034 Last data filed at 09/27/2019 0434 Gross per 24 hour  Intake 1137 ml  Output 975 ml  Net 162 ml   Filed Weights   09/25/19 0050 09/26/19 0524 09/27/19 0110  Weight: 97.1 kg 93.9 kg 93 kg    Examination:  General exam: Appears calm and comfortable Respiratory system: Bilateral equal air entry, normal vesicular breath sounds, no wheezes or crackles  Cardiovascular system: Sinus rhythm. No JVD, murmurs, rubs, gallops or clicks. No pedal edema.  Pacemaker wound on the left chest Gastrointestinal system: Abdomen is nondistended, soft and nontender. No organomegaly or masses felt. Normal bowel sounds heard. Central nervous system: Alert and oriented. No focal neurological deficits. Extremities: No edema, no clubbing ,no cyanosis, left knee wrapped with dressing.   Skin: No  rashes, lesions or ulcers,no icterus ,no pallor    Data Reviewed: I have personally reviewed following labs and imaging studies  CBC: Recent Labs  Lab 09/22/19 0502 09/23/19 0601 09/24/19 0421 09/25/19 0451 09/27/19 0543  WBC 9.6 10.7* 8.5 9.5 12.0*  HGB 9.5* 9.4* 9.5* 9.6* 10.0*  HCT 30.5* 30.5* 30.4* 30.0* 32.2*  MCV 93.0 93.8 92.1 92.9 93.9  PLT 131* 149* 137* 163 481   Basic Metabolic Panel: Recent Labs  Lab 09/21/19 0646 09/22/19 0502 09/23/19 0601 09/24/19 0421 09/25/19 0451 09/26/19 0411 09/27/19 0543  NA 139   < > 142 142 142 139 141  K 4.3   < > 4.8 3.8 3.5 4.0 5.1  CL 100   < > 104 102 100 98 94*  CO2 29   < > 23 29 33* 31 32  GLUCOSE 159*   < >  122* 113* 102* 115* 98  BUN 45*   < > 56* 59* 52* 54* 54*  CREATININE 2.01*   < > 2.04* 2.05* 2.27* 2.08* 2.48*  CALCIUM 8.7*   < > 8.5* 8.5* 8.4* 8.5* 8.7*  MG 2.3  --   --  2.3  --  2.5*  --    < > = values in this interval not displayed.   GFR: Estimated Creatinine Clearance: 31.7 mL/min (A) (by C-G formula based on SCr of 2.48 mg/dL (H)). Liver Function Tests: No results for input(s): AST, ALT, ALKPHOS, BILITOT, PROT, ALBUMIN in the last 168 hours. No results for input(s): LIPASE, AMYLASE in the last 168 hours. No results for input(s): AMMONIA in the last 168 hours. Coagulation Profile: Recent Labs  Lab 09/24/19 1442 09/25/19 0451 09/26/19 0411 09/27/19 0543  INR 1.2 1.2 1.4* 1.6*   Cardiac Enzymes: No results for input(s): CKTOTAL, CKMB, CKMBINDEX, TROPONINI in the last 168 hours. BNP (last 3 results) No results for input(s): PROBNP in the last 8760 hours. HbA1C: No results for input(s): HGBA1C in the last 72 hours. CBG: Recent Labs  Lab 09/26/19 0622 09/26/19 1150 09/26/19 1609 09/26/19 2149 09/27/19 0553  GLUCAP 100* 122* 162* 88 91   Lipid Profile: No results for input(s): CHOL, HDL, LDLCALC, TRIG, CHOLHDL, LDLDIRECT in the last 72 hours. Thyroid Function Tests: No results for  input(s): TSH, T4TOTAL, FREET4, T3FREE, THYROIDAB in the last 72 hours. Anemia Panel: No results for input(s): VITAMINB12, FOLATE, FERRITIN, TIBC, IRON, RETICCTPCT in the last 72 hours. Sepsis Labs: No results for input(s): PROCALCITON, LATICACIDVEN in the last 168 hours.  Recent Results (from the past 240 hour(s))  Surgical PCR screen     Status: None   Collection Time: 09/21/19 10:17 PM   Specimen: Nasal Mucosa; Nasal Swab  Result Value Ref Range Status   MRSA, PCR NEGATIVE NEGATIVE Final   Staphylococcus aureus NEGATIVE NEGATIVE Final    Comment: (NOTE) The Xpert SA Assay (FDA approved for NASAL specimens in patients 70 years of age and older), is one component of a comprehensive surveillance program. It is not intended to diagnose infection nor to guide or monitor treatment. Performed at [facility], [place] [address], [place], [place] [address]          Radiology Studies: No results found.      Scheduled Meds: . amiodarone  400 mg Oral Q8H  . aspirin  81 mg Oral Daily  . budesonide (PULMICORT) nebulizer solution  0.25 mg Nebulization BID  . clopidogrel  75 mg Oral Daily  . DULoxetine  60 mg Oral Daily  . feeding supplement (GLUCERNA SHAKE)  237 mL Oral TID BM  . furosemide  80 mg Oral BID  . insulin aspart  0-9 Units Subcutaneous TID WC  . lidocaine-EPINEPHrine  20 mL Intradermal Once  . metoprolol tartrate  25 mg Oral Q8H  . multivitamin with minerals  1 tablet Oral Daily  . tamsulosin  0.4 mg Oral QPC breakfast  . warfarin  3 mg Oral ONCE-1600  . Warfarin - Pharmacist Dosing Inpatient   Does not apply q1600   Continuous Infusions:   LOS: 11 days    Time spent: 25 mins.More than 50% of that time was spent in counseling and/or coordination of care.      Shelly Coss, MD [facility] P4/19/2021, 10:34 AM

## 2019-09-28 LAB — GLUCOSE, CAPILLARY
Glucose-Capillary: 133 mg/dL — ABNORMAL HIGH (ref 70–99)
Glucose-Capillary: 144 mg/dL — ABNORMAL HIGH (ref 70–99)
Glucose-Capillary: 119 mg/dL — ABNORMAL HIGH (ref 70–99)

## 2019-09-28 LAB — BASIC METABOLIC PANEL
Anion gap: 12 (ref 5–15)
BUN: 56 mg/dL — ABNORMAL HIGH (ref 8–23)
CO2: 32 mmol/L (ref 22–32)
Calcium: 8.5 mg/dL — ABNORMAL LOW (ref 8.9–10.3)
Chloride: 97 mmol/L — ABNORMAL LOW (ref 98–111)
Creatinine, Ser: 2.33 mg/dL — ABNORMAL HIGH (ref 0.61–1.24)
GFR calc Af Amer: 32 mL/min — ABNORMAL LOW (ref >60)
GFR calc non Af Amer: 28 mL/min — ABNORMAL LOW (ref >60)
Glucose, Bld: 152 mg/dL — ABNORMAL HIGH (ref 70–99)
Potassium: 4.3 mmol/L (ref 3.5–5.1)
Sodium: 141 mmol/L (ref 135–145)

## 2019-09-28 LAB — PROTIME-INR
INR: 2 — ABNORMAL HIGH (ref 0.8–1.2)
Prothrombin Time: 22.6 seconds — ABNORMAL HIGH (ref 11.4–15.2)

## 2019-09-28 MED ORDER — COUMADIN BOOK
Freq: Once | Status: DC
  Filled 2019-09-28: qty 1

## 2019-09-28 MED ORDER — FUROSEMIDE 80 MG PO TABS: 80.0000 mg | ORAL_TABLET | Freq: Two times a day (BID) | ORAL | 1 refills | Status: AC

## 2019-09-28 MED ORDER — WARFARIN SODIUM 2 MG PO TABS: 2.0000 mg | ORAL_TABLET | Freq: Once | ORAL | Status: DC

## 2019-09-28 MED ORDER — WARFARIN SODIUM 3 MG PO TABS: 3.0000 mg | ORAL_TABLET | Freq: Once | ORAL | Status: DC

## 2019-09-28 MED ORDER — ASPIRIN 81 MG PO CHEW: 81.0000 mg | CHEWABLE_TABLET | Freq: Every day | ORAL | 1 refills | Status: DC

## 2019-09-28 MED ORDER — WARFARIN SODIUM 3 MG PO TABS: 3.0000 mg | ORAL_TABLET | Freq: Every day | ORAL | 1 refills | Status: AC

## 2019-09-28 MED ORDER — AMIODARONE HCL 400 MG PO TABS: 400.0000 mg | ORAL_TABLET | Freq: Two times a day (BID) | ORAL | 1 refills | Status: DC

## 2019-09-28 MED ORDER — WARFARIN SODIUM 2 MG PO TABS
2.0000 mg | ORAL_TABLET | Freq: Once | ORAL | Status: AC
  Administered 2019-09-28: 2 mg via ORAL
  Filled 2019-09-28: qty 1

## 2019-09-28 NOTE — Progress Notes (Addendum)
ANTICOAGULATION CONSULT NOTE - Initial Consult  Pharmacy Consult for coumadin Indication: atrial fibrillation  Allergies  Allergen Reactions  . Contrast Media [Iodinated Diagnostic Agents]   . Sulfa Antibiotics     Patient Measurements: Height: 6' (182.9 cm) Weight: 92.5 kg (204 lb) IBW/kg (Calculated) : 77.6  Vital Signs: Temp: 97.5 F (36.4 C) (04/20 0321) Temp Source: Oral (04/20 0321) BP: 113/65 (04/20 0830) Pulse Rate: 63 (04/20 0830)  Labs: Recent Labs    09/26/19 0411 09/27/19 0543 09/28/19 0522  HGB  --  10.0*  --   HCT  --  32.2*  --   PLT  --  151  --   LABPROT 16.8* 19.3* 22.6*  INR 1.4* 1.6* 2.0*  CREATININE 2.08* 2.48* 2.33*    Estimated Creatinine Clearance: 33.8 mL/min (A) (by C-G formula based on SCr of 2.33 mg/dL (H)).   Medical History: Past Medical History:  Diagnosis Date  . A-fib ([place])   . Bradycardia 09/17/2019  . CHF (congestive heart failure) ([place])   . Diabetes mellitus type II, controlled ([place])   . HTN (hypertension)     Medications:  Scheduled:  . amiodarone  400 mg Oral BID  . aspirin  81 mg Oral Daily  . budesonide (PULMICORT) nebulizer solution  0.25 mg Nebulization BID  . clopidogrel  75 mg Oral Daily  . DULoxetine  60 mg Oral Daily  . feeding supplement (GLUCERNA SHAKE)  237 mL Oral TID BM  . furosemide  80 mg Oral BID  . insulin aspart  0-9 Units Subcutaneous TID WC  . lidocaine-EPINEPHrine  20 mL Intradermal Once  . metoprolol succinate  50 mg Oral QPC supper  . multivitamin with minerals  1 tablet Oral Daily  . tamsulosin  0.4 mg Oral QPC breakfast  . Warfarin - Pharmacist Dosing Inpatient   Does not apply q1600    Assessment: 26 yom admitted after fall and knee laceration. Patient on Eliquis prior to admission for atrial fibrillation (CHADSVASc 5).   Of note, patient had a pacer implanted on 4/14. Patient's INR was elevated on admission at 2.4. He received 10mg  of vit K and KCentra 4/9. He has had some bleeding  from the pocket site so Coumadin will be used for now instead of PTA Eliquis.   Pharmacy consulted to dose coumadin and let INR trend up non-aggressively. Please note that amiodarone may have a delayed response in increasing warfarin's effect on INR. Patient is on DAPT. Cards plans to transition back to Eliquis after possible DCCV.  INR this morning up to 2.0 from 1.6 yesterday. However, patient is refusing meals and eating <75%. No issues with bleeding reported per RN.   Goal of Therapy:  INR 2-3 Monitor platelets by anticoagulation protocol: Yes   Plan:  Coumadin (warfarin) 2 mg by mouth x1 tonight Daily INR  CBC every Monday, Wednesday and Friday Monitor s/sx's of bleeding Follow-up switching back to Eliquis     Ainsley Deakins L. Devin Going, [place] PGY1 Pharmacy Resident [phone] 09/28/19      9:26 AM  Please check AMION for all [place] phone numbers After 10:00 PM, call the [place] [phone]

## 2019-09-28 NOTE — Progress Notes (Signed)
Physical Therapy Treatment Patient Details Name: Justin Boone MRN: [id number] DOB: Oct 24, 1950 Today's Date: 09/28/2019    History of Present Illness Pt is a 69 y/o male admitted following fall secondary to symptomatic bradycardia. Pt sustained L knee laceration that required sutures. PMH includes COPD, a fib, CKD, CHF, CAD s/p stent placement, OSA on CPAP, and tobacco use. Pacemaker placed 4/14.     PT Comments    On entry pt very animated and excited about going to his son's home today. Focus of session, stair training as son's home has 2 steps to enter with no handrail. Pt up in chair, pushed chair to stairwell where PT and mobility tech assisted pt to standing with 2 person hand held assist, use of gait belt and minAx2. Pt able to ambulate to 2 feet to steps with minA. Pt requires maxAx2 for power up steps with decreased L UE pushing and L knee in brace. Pt with increased grimace and moaning with completion of up one step and back down. Pt encourage to perform again with similar grimace and moaning however once pt stepped back down, his eyes rolled back and he became minimally responsive. Pt held in place while recliner placed behind him and pt lowered into chair at which point pt could answer yes/no questions. Pt wheeled back to room, where he was falling asleep. Therapy continued to engage pt and he able to carry on limited conversation, however bilateral arms began to tremor. Pt reports that has happened before. Although notably lethargic pt set up for lunch and began eating. RN notified of episode. PT checked back with pt after 15 minutes and he had returned to his initial state of engagement. Although BP not available, likely vasovagal response to holding breath with painful movements. Pt educated on need for intentional breathing when performing painful tasks.    Follow Up Recommendations  Supervision/Assistance - 24 hour;Home health PT     Equipment Recommendations  3in1 (PT)        Precautions / Restrictions Precautions Precautions: Fall;ICD/Pacemaker Precaution Comments: Must keep knee extended; no flexion  Required Braces or Orthoses: Knee Immobilizer - Left Knee Immobilizer - Left: Other (comment)(for comfort to keep knee extended) Restrictions Weight Bearing Restrictions: Yes RUE Weight Bearing: Weight bearing as tolerated LUE Weight Bearing: Partial weight bearing LUE Partial Weight Bearing Percentage or Pounds: no pulling due to pacemake placement LLE Weight Bearing: Weight bearing as tolerated    Mobility  Bed Mobility               General bed mobility comments: OOB in recliner on entry   Transfers Overall transfer level: Needs assistance Equipment used: 2 person hand held assist Transfers: Sit to/from Stand Sit to Stand: Min assist;+2 physical assistance         General transfer comment: min A for power up and steadying until pt could get L LE underneath him to balance  Ambulation/Gait Ambulation/Gait assistance: Min assist;+2 physical assistance Gait Distance (Feet): 3 Feet Assistive device: 1 person hand held assist Gait Pattern/deviations: Step-to pattern;Decreased step length - right;Decreased stance time - left;Antalgic Gait velocity: slowed Gait velocity interpretation: <1.31 ft/sec, indicative of household ambulator General Gait Details: min A for steadying with power up into standing, continued cueing for sequencing to avoid pressure through L UE   Stairs Stairs: Yes Stairs assistance: Max assist;+2 physical assistance Stair Management: No rails;Forwards;Backwards;Step to pattern Number of Stairs: 1(1 step up and back down twice)  Balance Overall balance assessment: Needs assistance Sitting-balance support: No upper extremity supported;Feet supported Sitting balance-Leahy Scale: Fair     Standing balance support: Bilateral upper extremity supported;Single extremity supported;During functional  activity Standing balance-Leahy Scale: Poor Standing balance comment: Single UE required for balance; pt with x1 LOB at sink requiring minA for steadying                             Cognition Arousal/Alertness: Lethargic;Suspect due to medications Behavior During Therapy: [facility] for tasks assessed/performed;Flat affect Overall Cognitive Status: Impaired/Different from baseline                                 General Comments: on entry pt with appropriate cognitive status, however pt experienced episode similar to yesterday with decreased response and overall decreased awareness of situation and limited response to verbal cues         General Comments General comments (skin integrity, edema, etc.): Pt with episode of limited resposiveness immediately s/p performing painful task of stair training, pt exhibits eyes rolling back, limited response to questions, and increase lethargy, as well as tremors in bilateral hands, Dynamap not immediately available.for BP measurementm, however possible vasovagal response to pain. Pt given extensive education on need for breathing with performance of painful activities.       Pertinent Vitals/Pain Pain Assessment: Faces Faces Pain Scale: Hurts worst Pain Location: L shoulder with movemement, and L knee with weightbearing Pain Descriptors / Indicators: Grimacing;Guarding;Aching Pain Intervention(s): Limited activity within patient's tolerance;Monitored during session;Repositioned           PT Goals (current goals can now be found in the care plan section) Acute Rehab PT Goals Patient Stated Goal: to decrease pain  PT Goal Formulation: With patient Time For Goal Achievement: 10/04/19 Potential to Achieve Goals: Good Progress towards PT goals: Progressing toward goals    Frequency    Min 3X/week      PT Plan Discharge plan needs to be updated       AM-PAC PT "6 Clicks" Mobility   Outcome Measure  Help needed  turning from your back to your side while in a flat bed without using bedrails?: A Little Help needed moving from lying on your back to sitting on the side of a flat bed without using bedrails?: A Lot Help needed moving to and from a bed to a chair (including a wheelchair)?: A Lot Help needed standing up from a chair using your arms (e.g., wheelchair or bedside chair)?: A Lot Help needed to walk in hospital room?: A Lot Help needed climbing 3-5 steps with a railing? : Total 6 Click Score: 12    End of Session Equipment Utilized During Treatment: Gait belt Activity Tolerance: Patient limited by pain;Patient limited by fatigue Patient left: with call bell/phone within reach;in chair Nurse Communication: Mobility status;Other (comment)(KI use for OOB) PT Visit Diagnosis: Difficulty in walking, not elsewhere classified (R26.2);Pain Pain - Right/Left: Left Pain - part of body: Knee;Shoulder     Time: 4401-0272 PT Time Calculation (min) (ACUTE ONLY): 32 min  Charges:  $Gait Training: 23-37 mins                     Mikell Camp B. Migdalia Dk PT, DPT Acute Rehabilitation Services Pager [phone] Office [phone]    [place] 09/28/2019, 1:18 PM

## 2019-09-28 NOTE — Progress Notes (Signed)
  Wound check arranged for next week.  EP to see as needed while here.   Legrand Como [address]" [place], PA-C  09/28/2019 7:02 AM

## 2019-09-28 NOTE — Progress Notes (Signed)
Discharge education and medication education given to patient and son [name]) with teach back. Education on wound care to left knee and knee immobilizer  given. All questions and concerns answered. Peripheral IV and telemetry leads removed. All patient belongings given to patient and son. Patient transported to main entrance by nurse tech via wheelchair.

## 2019-09-28 NOTE — TOC Transition Note (Signed)
Transition of Care [facility]) - CM/SW Discharge Note   Patient Details  Name: Justin Boone MRN: [id number] Date of Birth: 05-29-1951  [facility] CM/SW Contact:  Zenon Mayo, RN Phone Number: 09/28/2019, 1:10 PM   Clinical Narrative:    Patient is for dc today, NCM spoke with patient, we tried to call [name] from patient 's phone, the call would not go thru, patient states he must be in area where he can not get calls.  NCM texted him the information that patient is set up with [facility] for [facility], [place], [place].  They will come out 2 to 3 times a week for 30 mins.  They  Do not come out every day.  When [name] comes to pick patient up at 5 pm today, the Staff RN will show him how to do the dressing change also to left extremity. NCM notified LIsa with [facility] that patient is for dc today.    Final next level of care: [place] Barriers to Discharge: No Barriers Identified   Patient Goals and CMS Choice Patient states their goals for this hospitalization and ongoing recovery are:: get better CMS Medicare.gov Compare Post Acute Care list provided to:: Patient Represenative (must comment) Choice offered to / list presented to : Adult Children  Discharge Placement                       Discharge Plan and Services                  DME Agency: NA       HH Arranged: RN, PT, Nurse's Aide [place] Agency: ([place]) Date [place]: 09/23/19 Time [place]: 1300 Representative spoke with at [facility]: [place] ([place]) Interventions     Readmission Risk Interventions No flowsheet data found.

## 2019-09-28 NOTE — Discharge Instructions (Signed)
Supplemental Discharge Instructions for  Pacemaker/Defibrillator Patients  Activity No heavy lifting or vigorous activity with your left/right arm for 6 to 8 weeks.  Do not raise your left/right arm above your head for one week.  Gradually raise your affected arm as drawn below.              09/26/2019                09/27/2019                09/28/2019               09/29/2019 __  NO DRIVING until cleared to after your wound check visit  [place] the wound area clean and dry.  Do not get this area wet, no showers until cleared to at your wound check visit. - The tape/steri-strips on your wound will fall off; do not pull them off.  No bandage is needed on the site.  DO  NOT apply any creams, oils, or ointments to the wound area. - If you notice any drainage or discharge from the wound, any swelling or bruising at the site, or you develop a fever > 101? F after you are discharged home, call the office at once.  Special Instructions - You are still able to use cellular telephones; use the ear opposite the side where you have your pacemaker/defibrillator.  Avoid carrying your cellular phone near your device. - When traveling through airports, show security personnel your identification card to avoid being screened in the metal detectors.  Ask the security personnel to use the hand wand. - Avoid arc welding equipment, MRI testing (magnetic resonance imaging), TENS units (transcutaneous nerve stimulators).  Call the office for questions about other devices. - Avoid electrical appliances that are in poor condition or are not properly grounded. - Microwave ovens are safe to be near or to operate.    Information on my medicine - Coumadin   (Warfarin)  Why was Coumadin prescribed for you? Coumadin was prescribed for you because you have a blood clot or a medical condition that can cause an increased risk of forming blood clots. Blood clots can cause serious health problems by blocking  the flow of blood to the heart, lung, or brain. Coumadin can prevent harmful blood clots from forming. As a reminder your indication for Coumadin is:   Stroke Prevention Because Of Atrial Fibrillation  What test will check on my response to Coumadin? While on Coumadin (warfarin) you will need to have an INR test regularly to ensure that your dose is keeping you in the desired range. The INR (international normalized ratio) number is calculated from the result of the laboratory test called prothrombin time (PT).  If an INR APPOINTMENT HAS NOT ALREADY BEEN MADE FOR YOU please schedule an appointment to have this lab work done by your health care provider within 7 days. Your INR goal is usually a number between:  2 to 3 or your provider may give you a more narrow range like 2-2.5.  Ask your health care provider during an office visit what your goal INR is.  What  do you need to  know  About  COUMADIN? Take Coumadin (warfarin) exactly as prescribed by your healthcare provider about the same time each day.  DO NOT stop taking without talking to the doctor who prescribed the medication.  Stopping without other blood clot prevention medication to take the place of Coumadin may  increase your risk of developing a new clot or stroke.  Get refills before you run out.  What do you do if you miss a dose? If you miss a dose, take it as soon as you remember on the same day then continue your regularly scheduled regimen the next day.  Do not take two doses of Coumadin at the same time.  Important Safety Information A possible side effect of Coumadin (Warfarin) is an increased risk of bleeding. You should call your healthcare provider right away if you experience any of the following: ? Bleeding from an injury or your nose that does not stop. ? Unusual colored urine (red or dark brown) or unusual colored stools (red or black). ? Unusual bruising for unknown reasons. ? A serious fall or if you hit your head (even  if there is no bleeding).  Some foods or medicines interact with Coumadin (warfarin) and might alter your response to warfarin. To help avoid this: ? Eat a balanced diet, maintaining a consistent amount of Vitamin K. ? Notify your provider about major diet changes you plan to make. ? Avoid alcohol or limit your intake to 1 drink for women and 2 drinks for men per day. (1 drink is 5 oz. wine, 12 oz. beer, or 1.5 oz. liquor.)  Make sure that ANY health care provider who prescribes medication for you knows that you are taking Coumadin (warfarin).  Also make sure the healthcare provider who is monitoring your Coumadin knows when you have started a new medication including herbals and non-prescription products.  Coumadin (Warfarin)  Major Drug Interactions  Increased Warfarin Effect Decreased Warfarin Effect  Alcohol (large quantities) Antibiotics (esp. Septra/Bactrim, Flagyl, Cipro) Amiodarone (Cordarone) Aspirin (ASA) Cimetidine (Tagamet) Megestrol (Megace) NSAIDs (ibuprofen, naproxen, etc.) Piroxicam (Feldene) Propafenone (Rythmol SR) Propranolol (Inderal) Isoniazid (INH) Posaconazole (Noxafil) Barbiturates (Phenobarbital) Carbamazepine (Tegretol) Chlordiazepoxide (Librium) Cholestyramine (Questran) Griseofulvin Oral Contraceptives Rifampin Sucralfate (Carafate) Vitamin K   Coumadin (Warfarin) Major Herbal Interactions  Increased Warfarin Effect Decreased Warfarin Effect  Garlic Ginseng Ginkgo biloba Coenzyme Q10 Green tea St. John's wort    Coumadin (Warfarin) FOOD Interactions  Eat a consistent number of servings per week of foods HIGH in Vitamin K (1 serving =  cup)  Collards (cooked, or boiled & drained) Kale (cooked, or boiled & drained) Mustard greens (cooked, or boiled & drained) Parsley *serving size only =  cup Spinach (cooked, or boiled & drained) Swiss chard (cooked, or boiled & drained) Turnip greens (cooked, or boiled & drained)  Eat a consistent  number of servings per week of foods MEDIUM-HIGH in Vitamin K (1 serving = 1 cup)  Asparagus (cooked, or boiled & drained) Broccoli (cooked, boiled & drained, or raw & chopped) Brussel sprouts (cooked, or boiled & drained) *serving size only =  cup Lettuce, raw (green leaf, endive, romaine) Spinach, raw Turnip greens, raw & chopped   These websites have more information on Coumadin (warfarin):  FailFactory.se; VeganReport.com.au;

## 2019-09-28 NOTE — Progress Notes (Signed)
Inpatient Rehabilitation Admissions Coordinator  Inpatient rehab consult received. I met with patient at bedside and spoke with his son, John , by phone. They were pursuing CIR admit for pt's wife was admitted to CIR 10 years ago. The disposition plan is for patient to live with son where the son and his wife can provide 24/7 assist as early as today. They do not want to pursue SNF. I have alerted Dr. Adhikari, SW, Liz, and acute care team. We will sign off at this time. Please call me with any questions.  Barbara Boyette, RN, MSN Rehab Admissions Coordinator [phone] 09/28/2019 10:37 AM  

## 2019-09-28 NOTE — Progress Notes (Addendum)
ANTICOAGULATION CONSULT NOTE - Initial Consult  Pharmacy Consult for coumadin Indication: atrial fibrillation  Allergies  Allergen Reactions  . Contrast Media [Iodinated Diagnostic Agents]   . Sulfa Antibiotics     Patient Measurements: Height: 6' (182.9 cm) Weight: 92.5 kg (204 lb) IBW/kg (Calculated) : 77.6  Vital Signs: Temp: 98.6 F (37 C) (04/20 1111) Temp Source: Oral (04/20 1111) BP: 113/62 (04/20 1111) Pulse Rate: 62 (04/20 1111)  Labs: Recent Labs    09/26/19 0411 09/27/19 0543 09/28/19 0522  HGB  --  10.0*  --   HCT  --  32.2*  --   PLT  --  151  --   LABPROT 16.8* 19.3* 22.6*  INR 1.4* 1.6* 2.0*  CREATININE 2.08* 2.48* 2.33*    Estimated Creatinine Clearance: 33.8 mL/min (A) (by C-G formula based on SCr of 2.33 mg/dL (H)).   Medical History: Past Medical History:  Diagnosis Date  . A-fib ([place])   . Bradycardia 09/17/2019  . CHF (congestive heart failure) ([place])   . Diabetes mellitus type II, controlled ([place])   . HTN (hypertension)     Medications:  Scheduled:  . amiodarone  400 mg Oral BID  . budesonide (PULMICORT) nebulizer solution  0.25 mg Nebulization BID  . clopidogrel  75 mg Oral Daily  . DULoxetine  60 mg Oral Daily  . feeding supplement (GLUCERNA SHAKE)  237 mL Oral TID BM  . furosemide  80 mg Oral BID  . insulin aspart  0-9 Units Subcutaneous TID WC  . lidocaine-EPINEPHrine  20 mL Intradermal Once  . metoprolol succinate  50 mg Oral QPC supper  . multivitamin with minerals  1 tablet Oral Daily  . tamsulosin  0.4 mg Oral QPC breakfast  . Warfarin - Pharmacist Dosing Inpatient   Does not apply q1600    Assessment: 23 yom admitted after fall and knee laceration. Patient on Eliquis prior to admission for atrial fibrillation (CHADSVASc 5).   Of note, patient had a pacer implanted on 4/14. Patient's INR was elevated on admission at 2.4. He received 10mg  of vit K and KCentra 4/9. He has had some bleeding from the pocket site so Coumadin  will be used for now instead of PTA Eliquis.   Pharmacy consulted to dose coumadin and let INR trend up non-aggressively. Please note that amiodarone may have a delayed response in increasing warfarin's effect on INR. Patient is on DAPT for DES placed in March - at discharge will continue on plavix (without aspirin).   INR this morning up to 2.0 from 1.6 yesterday. However, patient is refusing meals and eating <75%. No issues with bleeding reported per RN. Plan to discharge on warfarin- no longer planning to transition to apixaban at this time per EP.   Goal of Therapy:  INR 2-3 Monitor platelets by anticoagulation protocol: Yes   Plan:  Will order warfarin 2 mg tonight  Would recommend discharge regimen of warfarin 3 mg daily starting on 4/21 Plan for INR appointment in [place] on Thursday   Jaquia Benedicto, PharmD, [place] Pharmacist  Phone: [phone] 09/28/2019 12:02 PM  Please check AMION for all [place] phone numbers After 10:00 PM, call [place] [phone]

## 2019-09-28 NOTE — Discharge Summary (Addendum)
Physician Discharge Summary  Justin Boone XUX:[id number] DOB: [birth year] DOA: 09/16/2019  PCP: Patient, No Pcp Per  Admit date: 09/16/2019 Discharge date: 09/28/2019  Admitted From: Home Disposition:  Home  Discharge Condition:Stable CODE STATUS:FULL Diet recommendation: Heart Healthy   Brief/Interim Summary:  Patient is a 69 year old male with history of A. fib on anticoagulation with Eliquis, diabetes type 2, hypertension, COPD, OSA on CPAP, diabetes, CKD stage IIIb, chronic systolic heart failure, who presents to the emergency department for the evaluation of fall, left knee laceration.  X-rays of the left knee showed Moderate infrapatellar soft tissue swelling, soft tissue air without evidence of acute abnormality.  Hospital course remarkable for anemia from bleeding from the laceration site.  Hospital course also remarkable for tachybradycardia syndrome.  EP consulted.  Status post pacemaker placement.  Cardiology cleared for discharge.  PT/OT recommended CIR but patient and family opted for home health.  He is hemodynamically stable for discharge home today.  Following problems were addressed during his  Hospitalization:  Left knee laceration: Present on admission.  Hospital course remarkable for profuse/persistent bleeding from the laceration site causing acute blood loss anemia.  Controlled with compression bandages.  Orthopedics was following.  He also completed oral antibiotic therapy.  Orthopedics recommended weightbearing as tolerated on the left lower extremity, follow-up as an outpatient in 2 weeks. PT/OT recommending CIR but patient wanted to go home with home health  A. fib with tachybradycardia syndrome/syncope: Status post pacemaker placement on 4/14.  On lopressor,amiodarone.  Issues with pocket site bleeding.  EP team was following.   On Eliquis at home, but due to bleeding episodes here he has been started on warfarin.  He will be on warfarin until he follows up with  cardiology.  Check INR on Thursday.  Acute on chronic systolic heart failure: Echo showed ejection fraction of 40%-45%, global hypokinesis, grade 2 diastolic dysfunction.  On Lasix 80 mg twice a day now.    Coronary artery disease: Status post DES on 3/21 at outside facility.  On Plavix  AKI on stage IIIb CKD: Ultrasound of the kidneys did not show any hydronephrosis or obstruction.  Creatinine around 2,near baseline.  Continue to monitor.Check Bmp in a week  Diabetes type 2: A1c of 6 .  Diet Controlled.    History of COPD/tobacco abuse: Counseled for tobacco cessation.  Previously wheezing now is resolved.  Prednisone tapered.  Weaned off oxygen.  OSA: CPAP at night.  Severe aortic stenosis: Plan for TAVR at [facility] after this hospitalization   Discharge Diagnoses:  Principal Problem:   Symptomatic bradycardia Active Problems:   Paroxysmal atrial fibrillation (HCC)   QT prolongation   COPD with acute exacerbation (HCC)   AKI (acute kidney injury) (HCC)   Knee laceration, left, initial encounter   Chronic systolic heart failure (HCC)   Severe aortic stenosis   CAD (coronary artery disease)   S/P primary angioplasty with coronary stent    Discharge Instructions  Discharge Instructions    Diet - low sodium heart healthy   Complete by: As directed    Carb modified diet   Discharge instructions   Complete by: As directed    1)Please take prescribed medications as instructed. 2)Follow up with your PCP, cardiology, orthopedics as an outpatient.  Name and number of the providers have been attached. 3)Follow up with Home Health. 4)Check INR on Thursday by following at Coumadin clinic.   Increase activity slowly   Complete by: As directed      Allergies as  of 09/28/2019      Reactions   Contrast Media [iodinated Diagnostic Agents]    Sulfa Antibiotics       Medication List    STOP taking these medications   Eliquis 5 MG Tabs tablet Generic drug: apixaban    torsemide 20 MG tablet Commonly known as: DEMADEX     TAKE these medications   albuterol (2.5 MG/3ML) 0.083% nebulizer solution Commonly known as: PROVENTIL Inhale 3 mLs into the lungs every 6 (six) hours as needed for shortness of breath or wheezing.   allopurinol 300 MG tablet Commonly known as: ZYLOPRIM Take 300 mg by mouth daily.   amiodarone 400 MG tablet Commonly known as: PACERONE Take 1 tablet (400 mg total) by mouth 2 (two) times daily. What changed:   medication strength  how much to take  when to take this   atorvastatin 80 MG tablet Commonly known as: LIPITOR Take 80 mg by mouth daily.   clopidogrel 75 MG tablet Commonly known as: PLAVIX Take 75 mg by mouth daily.   DULoxetine 60 MG capsule Commonly known as: CYMBALTA Take 60 mg by mouth daily.   furosemide 80 MG tablet Commonly known as: LASIX Take 1 tablet (80 mg total) by mouth 2 (two) times daily.   metoprolol succinate 50 MG 24 hr tablet Commonly known as: TOPROL-XL Take 50 mg by mouth daily.   tamsulosin 0.4 MG Caps capsule Commonly known as: FLOMAX Take 0.4 mg by mouth daily.   Trelegy Ellipta 100-62.5-25 MCG/INH Aepb Generic drug: Fluticasone-Umeclidin-Vilant Inhale 1 puff into the lungs daily.   warfarin 3 MG tablet Commonly known as: Coumadin Take 1 tablet (3 mg total) by mouth daily. Please cancel the earlier prescription for aspirin      Follow-up Information    Hiram Gash, MD In 2 weeks.   Specialty: Orthopedic Surgery Why: For left knee wound re-check Contact information: [address] [address] [phone]        [facility] Follow up.   Specialty: Cardiology Why: 10/05/2019 @ 2:30PM, pacemaker wound check visit Contact information: [address], [place]       Deboraha Sprang, MD Follow up.   Specialty: Cardiology Why: 12/28/2019 @ 3:15PM (pacemaker  doctor) Contact information: [address] [place] [address] [phone]        Verdell Carmine., MD On 09/28/2019.   Specialty: Family Medicine Why: @10 :15am;please arrive @ 10:00am Contact information: [place] [address] [phone]        [facility] Follow up.   Why: HHRN,HHPT, HHAIDE Contact information: [place] [address] [phone]       [facility] Follow up.   Why: Go Thursday 4/22 at 1045 for a coumadin/INR check. Contact information: [place], [place] [address]         Allergies  Allergen Reactions  . Contrast Media [Iodinated Diagnostic Agents]   . Sulfa Antibiotics     Consultations:  Cardiology,Orthopedics   Procedures/Studies: DG Chest 2 View  Result Date: 09/23/2019 CLINICAL DATA:  69 year old male status post pacemaker insertion. EXAM: CHEST - 2 VIEW COMPARISON:  Chest x-ray 09/18/2019. FINDINGS: Lung volumes are normal. No consolidative airspace disease. Small bilateral pleural effusions. There is cephalization of the pulmonary vasculature and slight indistinctness of the interstitial markings suggestive of mild pulmonary edema. No pneumothorax. Bibasilar  opacities favored to reflect areas of subsegmental atelectasis. Mild cardiomegaly. Aortic atherosclerosis. New left-sided biventricular pacemaker with lead tips projecting over the expected location of the right atrium, right ventricle and overlying the left ventricle via the coronary sinus and coronary veins. IMPRESSION: 1. New left-sided biventricular pacemaker appears well positioned with no pneumothorax or other acute complicating features. 2. The appearance of the chest suggest congestive heart failure, as above. 3. Aortic atherosclerosis. Electronically Signed   By: Vinnie Langton M.D.   On: 09/23/2019 07:23   DG Forearm Left  Result Date: 09/16/2019 CLINICAL DATA:  Status post fall. EXAM:  LEFT FOREARM - 2 VIEW COMPARISON:  None. FINDINGS: There is no evidence of fracture or other focal bone lesions. Soft tissues are unremarkable. IMPRESSION: Negative. Electronically Signed   By: Virgina Norfolk M.D.   On: 09/16/2019 19:08   CT Head Wo Contrast  Result Date: 09/16/2019 CLINICAL DATA:  69 year old male with multiple falls today. Positive loss of consciousness. EXAM: CT HEAD WITHOUT CONTRAST TECHNIQUE: Contiguous axial images were obtained from the base of the skull through the vertex without intravenous contrast. COMPARISON:  [facility] 06/15/2007. FINDINGS: Brain: Cerebral volume is within normal limits for age. No midline shift, ventriculomegaly, mass effect, evidence of mass lesion, intracranial hemorrhage or evidence of cortically based acute infarction. Patchy and confluent bilateral cerebral white matter hypodensity is new since 2009, although fairly symmetric. The deep gray nuclei, brainstem and cerebellum remain within normal limits. No cortical encephalomalacia identified. Vascular: Calcified atherosclerosis at the skull base.No suspicious intracranial vascular hyperdensity. Skull: Intact, negative. Sinuses/Orbits: Visualized paranasal sinuses and mastoids are clear. Other: No acute orbit or scalp soft tissue findings. Postoperative changes to both globes. IMPRESSION: 1. No acute intracranial abnormality or acute traumatic injury identified. 2. 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 Electronically Signed   By: Genevie Ann M.D.   On: 09/16/2019 19:32   CT Cervical Spine Wo Contrast  Result Date: 09/16/2019 CLINICAL DATA:  69 year old male with multiple falls today. Positive loss of consciousness. EXAM: CT CERVICAL SPINE WITHOUT CONTRAST TECHNIQUE: Multidetector CT imaging of the cervical spine was performed without intravenous contrast. Multiplanar CT image reconstructions were also generated.  COMPARISON:  Head CT today reported separately. [facility] cervical spine MRI 07/19/2014. Cervical spine radiographs 02/13/2018. FINDINGS: Alignment: Chronic straightening of cervical lordosis similar to the prior MRI. Mild chronic retrolisthesis at C6-C7. Bilateral posterior element alignment is within normal limits. Skull base and vertebrae: Motion artifact C5 through C7. Visualized skull base is intact. No atlanto-occipital dissociation. C1 and C2 appear intact and normally aligned. No acute osseous abnormality identified. Soft tissues and spinal canal: No prevertebral fluid or swelling. No visible canal hematoma. Bilateral calcified carotid atherosclerosis in the neck. Disc levels: Cervical spine degeneration appears progressed since the 2016 MRI, likely with new or increased upper cervical spine mild degenerative stenosis such as at C2-C3 and C3-C4 (sagittal image 55). Lower cervical spinal stenosis to severe now. At C6-C7 could be moderate Upper chest: Low-density at least moderate size layering pleural effusions in both lung apices. No pneumothorax. Visible upper thoracic levels appear grossly intact. Calcified aortic atherosclerosis. Other: Carious dentition. IMPRESSION: 1. No acute traumatic injury identified in the cervical spine. Mild motion artifact C5 through C7. 2. 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 3. Bilateral layering pleural effusions visible in the  lung apices, at least moderate. 4. Aortic Atherosclerosis (ICD10-I70.0). 5. Carious dentition. Electronically Signed   By: Genevie Ann M.D.   On: 09/16/2019 19:38   CT Knee Left Wo Contrast  Result Date: 09/16/2019 CLINICAL DATA:  Status post fall, question of tibial plateau fracture EXAM: CT OF THE left  KNEE WITHOUT CONTRAST TECHNIQUE: Multidetector CT imaging of the left knee was performed according to the standard protocol.  Multiplanar CT image reconstructions were also generated. COMPARISON:  None. FINDINGS: Bones/Joint/Cartilage No definite fracture or dislocation is seen. 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 A tiny amount of suprapatellar fluid is seen. Ligaments Suboptimally assessed by CT. There does appear to be however thickening and edema seen around the lateral patellar retinaculum. Muscles and Tendons The muscles appear to be grossly intact. No focal atrophy or tear. The patellar and quadriceps tendon are intact. Soft tissues Significant overlying subcutaneous edema soft tissue swelling and small foci of subcutaneous emphysema are noted. Along the lateral patellar retinaculum there is a approximately 3 cm soft tissue hematoma. And along the proximal medial tibia there is a small 2 cm soft tissue hematoma. There is also overlying skin laceration seen. Scattered vascular calcifications are noted. IMPRESSION: No definite fracture seen. Edema and thickening seen around the lateral patellar retinaculum, likely due to intrasubstance sprain. Overlying prepatellar laceration with significant subcutaneous edema and small soft tissue hematomas as described above. Electronically Signed   By: Prudencio Pair M.D.   On: 09/16/2019 19:40   US RENAL  Result Date: 09/17/2019 CLINICAL DATA:  Acute renal failure. EXAM: RENAL / URINARY TRACT ULTRASOUND COMPLETE COMPARISON:  CT dated 04/03/2019 FINDINGS: Right Kidney: Renal measurements: 8.6 x 3.9 x 3.4 cm = volume: 60 mL . Echogenicity within normal limits. No mass or hydronephrosis visualized. Left Kidney: Renal measurements: 12.3 x 5.9 x 5.3 cm = volume: 200 mL. There is a lower pole cyst measuring approximately 1.4 cm. Bladder: The bladder is distended.  The bladder wall is thickened. Other: Bilateral pleural effusions are noted. These appear to be at least moderate size, especially on the right. IMPRESSION: 1. No evidence for hydronephrosis.  2. Incidentally noted bilateral pleural effusions. 3. Distended urinary bladder with bladder wall thickening. Correlation with urinalysis is recommended. Electronically Signed   By: Constance Holster M.D.   On: 09/17/2019 01:03   DG Pelvis Portable  Result Date: 09/16/2019 CLINICAL DATA:  Status post fall. EXAM: PORTABLE PELVIS 1-2 VIEWS COMPARISON:  None. FINDINGS: There is no evidence of an acute pelvic fracture or diastasis. Mild chronic changes are seen along the symphysis pubis. A total right hip replacement is noted. There is no evidence of surrounding lucency to suggest the presence of hardware loosening or infection. Soft tissue structures are unremarkable. IMPRESSION: Intact right hip replacement. Electronically Signed   By: Virgina Norfolk M.D.   On: 09/16/2019 19:12   DG Chest Port 1 View  Result Date: 09/18/2019 CLINICAL DATA:  Pleural effusion follow-up. EXAM: PORTABLE CHEST 1 VIEW COMPARISON:  September 16, 2019 FINDINGS: Cardiomegaly is stable. The hila and mediastinum are normal. Small bilateral effusions with underlying atelectasis. Increased interstitial markings consistent with mild edema, increased in the interval. No other acute abnormalities. IMPRESSION: Cardiomegaly, small stable pleural effusions, mild increased pulmonary edema. Electronically Signed   By: Dorise Bullion III M.D   On: 09/18/2019 12:23   DG Chest Port 1 View  Result Date: 09/16/2019 CLINICAL DATA:  Status post fall with loss of consciousness. EXAM: PORTABLE  CHEST 1 VIEW COMPARISON:  June 15, 2019 FINDINGS: Mild, stable diffusely increased lung markings are seen. Mild atelectasis and/or infiltrate is seen within the retrocardiac region of the left lung base. Small bilateral pleural effusions are noted. There is no evidence of a pneumothorax. The cardiac silhouette is mildly enlarged and unchanged in size. There is moderate severity calcification of the aortic arch. The visualized skeletal structures are unremarkable.  IMPRESSION: 1. Mild left basilar atelectasis and/or infiltrate. 2. Small bilateral pleural effusions. Electronically Signed   By: Virgina Norfolk M.D.   On: 09/16/2019 19:07   DG Shoulder Left  Result Date: 09/16/2019 CLINICAL DATA:  Status post fall. EXAM: LEFT SHOULDER - 2+ VIEW COMPARISON:  None. FINDINGS: There is no evidence of acute fracture or dislocation. Moderate severity degenerative changes seen involving the distal left clavicle, left acromioclavicular joint and left acromion. Soft tissues are unremarkable. IMPRESSION: Degenerative changes, without evidence of acute osseous abnormality. Electronically Signed   By: Virgina Norfolk M.D.   On: 09/16/2019 19:14   DG Knee Left Port  Result Date: 09/16/2019 CLINICAL DATA:  Status post fall. EXAM: PORTABLE LEFT KNEE - 1-2 VIEW COMPARISON:  None. FINDINGS: It should be noted that the medial and lateral epicondyles of the distal left femur are limited in evaluation on the frontal view secondary to the presence of an overlying radiopaque bandage. No evidence of acute fracture, dislocation, or joint effusion. No evidence of arthropathy or other focal bone abnormality. There is mild infrapatellar soft tissue swelling. A mild to moderate amount of soft tissue air is also seen within this region. IMPRESSION: 1. Mild to moderate amount of infrapatellar soft tissue swelling and soft tissue air without evidence of acute osseous abnormality. Electronically Signed   By: Virgina Norfolk M.D.   On: 09/16/2019 19:10   DG Tibia/Fibula Left Port  Result Date: 09/16/2019 CLINICAL DATA:  Fall EXAM: PORTABLE LEFT TIBIA AND FIBULA - 2 VIEW COMPARISON:  None. FINDINGS: Infrapatellar soft tissue swelling with soft tissue air again noted as seen on earlier knee series. No acute fracture, subluxation or dislocation. Old healed distal fibular shaft fracture. IMPRESSION: No acute bony abnormality. Electronically Signed   By: Rolm Baptise M.D.   On: 09/16/2019 19:32    ECHOCARDIOGRAM COMPLETE  Result Date: 09/17/2019    ECHOCARDIOGRAM REPORT   Patient Name:   CHEYENNE SCHUMM Date of Exam: 09/17/2019 Medical Rec #:  [id number]    Height:       72.0 in Accession #:    [id number]   Weight:       190.0 lb Date of Birth:  1950/07/28    BSA:          2.085 m Patient Age:    38 years     BP:           110/66 mmHg Patient Gender: M            HR:           52 bpm. Exam Location:  Inpatient Procedure: 2D Echo, Cardiac Doppler and Color Doppler Indications:    Aortic Stenosis 424.1/I35.0  History:        Patient has no prior history of Echocardiogram examinations.                 COPD, Arrythmias:Atrial Fibrillation; Risk Factors:Current                 Smoker.  Sonographer:    Clayton Lefort RDCS (AE) Referring Phys: [phone]  VASUNDHRA RATHORE IMPRESSIONS  1. Left ventricular ejection fraction, by estimation, is 40 to 45%. The left ventricle has mildly decreased function. The left ventricle demonstrates global hypokinesis. The left ventricular internal cavity size was mildly dilated. Left ventricular diastolic parameters are consistent with Grade II diastolic dysfunction (pseudonormalization). Elevated left atrial pressure.  2. Right ventricular systolic function is mildly reduced. The right ventricular size is mildly enlarged. There is moderately elevated pulmonary artery systolic pressure.  3. Left atrial size was mildly dilated.  4. Right atrial size was mildly dilated.  5. The mitral valve is normal in structure. Mild mitral valve regurgitation.  6. The aortic valve is tricuspid. Aortic valve regurgitation is mild. Severe aortic valve stenosis.  7. The inferior vena cava is dilated in size with <50% respiratory variability, suggesting right atrial pressure of 15 mmHg. Comparison(s): No prior Echocardiogram. Conclusion(s)/Recommendation(s): Findings are consistent with low gradient severe aortic stenosis, but the aortic jet remains early peaking and the absence of left ventricular hypertrophy  and the presence of left ventricular dilation suggests that left ventricular systolic dysfunction could be due to a primary cardiomyopathy. Consider dobutamine echocardiography. FINDINGS  Left Ventricle: Left ventricular ejection fraction, by estimation, is 40 to 45%. The left ventricle has mildly decreased function. The left ventricle demonstrates global hypokinesis. The left ventricular internal cavity size was mildly dilated. There is  no left ventricular hypertrophy. Left ventricular diastolic parameters are consistent with Grade II diastolic dysfunction (pseudonormalization). Elevated left atrial pressure. Right Ventricle: The right ventricular size is mildly enlarged. No increase in right ventricular wall thickness. Right ventricular systolic function is mildly reduced. There is moderately elevated pulmonary artery systolic pressure. The tricuspid regurgitant velocity is 2.69 m/s, and with an assumed right atrial pressure of 15 mmHg, the estimated right ventricular systolic pressure is 29.4 mmHg. Left Atrium: Left atrial size was mildly dilated. Right Atrium: Right atrial size was mildly dilated. Pericardium: There is no evidence of pericardial effusion. Mitral Valve: The mitral valve is normal in structure. There is mild thickening of the mitral valve leaflet(s). Mild mitral valve regurgitation. Tricuspid Valve: The tricuspid valve is normal in structure. Tricuspid valve regurgitation is mild. Aortic Valve: Gradients are lower than expected due to low cardiac output/left ventricular dysfunction. The aortic valve is tricuspid. . There is severe thickening and severe calcifcation of the aortic valve. Aortic valve regurgitation is mild. Aortic regurgitation PHT measures 1066 msec. Severe aortic stenosis is present. There is severe thickening of the aortic valve. There is severe calcifcation of the aortic valve. Aortic valve mean gradient measures 23.2 mmHg. Aortic valve peak gradient measures 40.3 mmHg. Aortic  valve area, by VTI measures 0.94 cm. Pulmonic Valve: The pulmonic valve was grossly normal. Pulmonic valve regurgitation is not visualized. Aorta: The aortic root is normal in size and structure. Venous: The inferior vena cava is dilated in size with less than 50% respiratory variability, suggesting right atrial pressure of 15 mmHg. IAS/Shunts: No atrial level shunt detected by color flow Doppler. Additional Comments: There is pleural effusion in the left lateral region.  LEFT VENTRICLE PLAX 2D LVIDd:         5.75 cm LVIDs:         4.40 cm LV PW:         1.10 cm LV IVS:        0.94 cm LVOT diam:     2.17 cm LV SV:         70 LV SV Index:   34 LVOT  Area:     3.70 cm  LV Volumes (MOD) LV vol d, MOD A4C: 187.0 ml LV vol s, MOD A4C: 84.2 ml LV SV MOD A4C:     187.0 ml RIGHT VENTRICLE             IVC RV S prime:     10.10 cm/s  IVC diam: 2.50 cm TAPSE (M-mode): 1.5 cm LEFT ATRIUM           Index       RIGHT ATRIUM           Index LA diam:      3.90 cm 1.87 cm/m  RA Area:     20.50 cm LA Vol (A2C): 43.5 ml 20.87 ml/m RA Volume:   53.30 ml  25.57 ml/m LA Vol (A4C): 84.2 ml 40.39 ml/m  AORTIC VALVE AV Area (Vmax):    0.91 cm AV Area (Vmean):   0.88 cm AV Area (VTI):     0.94 cm AV Vmax:           317.50 cm/s AV Vmean:          225.500 cm/s AV VTI:            0.745 m AV Peak Grad:      40.3 mmHg AV Mean Grad:      23.2 mmHg LVOT Vmax:         77.90 cm/s LVOT Vmean:        53.875 cm/s LVOT VTI:          0.190 m LVOT/AV VTI ratio: 0.25 AI PHT:            1066 msec  AORTA Ao Root diam: 3.30 cm TRICUSPID VALVE TR Peak grad:   28.9 mmHg TR Vmax:        269.00 cm/s  SHUNTS Systemic VTI:  0.19 m Systemic Diam: 2.17 cm Dani Gobble Croitoru MD Electronically signed by Sanda Klein MD Signature Date/Time: 09/17/2019/4:55:20 PM    Final       Subjective: Patient seen and examined at the bedside this morning.  Hemodynamically stable for discharge.  Discharge Exam: Vitals:   09/28/19 0845 09/28/19 1111  BP:  113/62   Pulse:  62  Resp:  20  Temp:  98.6 F (37 C)  SpO2: 93% 92%   Vitals:   09/28/19 0321 09/28/19 0830 09/28/19 0845 09/28/19 1111  BP: 107/71 113/65  113/62  Pulse: (!) 59 63  62  Resp: 16 17  20   Temp: (!) 97.5 F (36.4 C)   98.6 F (37 C)  TempSrc: Oral   Oral  SpO2: 91% 97% 93% 92%  Weight:      Height:        General: Pt is alert, awake, not in acute distress Cardiovascular: RRR, S1/S2 +, no rubs, no gallops Respiratory: CTA bilaterally, no wheezing, no rhonchi Abdominal: Soft, NT, ND, bowel sounds + Extremities: no edema, no cyanosis    The results of significant diagnostics from this hospitalization (including imaging, microbiology, ancillary and laboratory) are listed below for reference.     Microbiology: Recent Results (from the past 240 hour(s))  Surgical PCR screen     Status: None   Collection Time: 09/21/19 10:17 PM   Specimen: Nasal Mucosa; Nasal Swab  Result Value Ref Range Status   MRSA, PCR NEGATIVE NEGATIVE Final   Staphylococcus aureus NEGATIVE NEGATIVE Final    Comment: (NOTE) The Xpert SA Assay (FDA approved for NASAL specimens in patients 22  years of age and older), is one component of a comprehensive surveillance program. It is not intended to diagnose infection nor to guide or monitor treatment. Performed at [facility], [place] [address], [place], [place] [address]      Labs: BNP (last 3 results) No results for input(s): BNP in the last 8760 hours. Basic Metabolic Panel: Recent Labs  Lab 09/24/19 0421 09/25/19 0451 09/26/19 0411 09/27/19 0543 09/28/19 0522  NA 142 142 139 141 141  K 3.8 3.5 4.0 5.1 4.3  CL 102 100 98 94* 97*  CO2 29 33* 31 32 32  GLUCOSE 113* 102* 115* 98 152*  BUN 59* 52* 54* 54* 56*  CREATININE 2.05* 2.27* 2.08* 2.48* 2.33*  CALCIUM 8.5* 8.4* 8.5* 8.7* 8.5*  MG 2.3  --  2.5*  --   --    Liver Function Tests: No results for input(s): AST, ALT, ALKPHOS, BILITOT, PROT, ALBUMIN in the last 168  hours. No results for input(s): LIPASE, AMYLASE in the last 168 hours. No results for input(s): AMMONIA in the last 168 hours. CBC: Recent Labs  Lab 09/22/19 0502 09/23/19 0601 09/24/19 0421 09/25/19 0451 09/27/19 0543  WBC 9.6 10.7* 8.5 9.5 12.0*  HGB 9.5* 9.4* 9.5* 9.6* 10.0*  HCT 30.5* 30.5* 30.4* 30.0* 32.2*  MCV 93.0 93.8 92.1 92.9 93.9  PLT 131* 149* 137* 163 151   Cardiac Enzymes: No results for input(s): CKTOTAL, CKMB, CKMBINDEX, TROPONINI in the last 168 hours. BNP: Invalid input(s): POCBNP CBG: Recent Labs  Lab 09/27/19 1556 09/27/19 1645 09/27/19 2112 09/28/19 0618 09/28/19 1112  GLUCAP 84 98 142* 133* 144*   D-Dimer No results for input(s): DDIMER in the last 72 hours. Hgb A1c No results for input(s): HGBA1C in the last 72 hours. Lipid Profile No results for input(s): CHOL, HDL, LDLCALC, TRIG, CHOLHDL, LDLDIRECT in the last 72 hours. Thyroid function studies No results for input(s): TSH, T4TOTAL, T3FREE, THYROIDAB in the last 72 hours.  Invalid input(s): FREET3 Anemia work up No results for input(s): VITAMINB12, FOLATE, FERRITIN, TIBC, IRON, RETICCTPCT in the last 72 hours. Urinalysis    Component Value Date/Time   COLORURINE YELLOW 09/16/2019 2352   APPEARANCEUR CLEAR 09/16/2019 2352   LABSPEC 1.010 09/16/2019 2352   PHURINE 5.0 09/16/2019 2352   GLUCOSEU NEGATIVE 09/16/2019 2352   HGBUR NEGATIVE 09/16/2019 2352   BILIRUBINUR NEGATIVE 09/16/2019 2352   KETONESUR NEGATIVE 09/16/2019 2352   PROTEINUR NEGATIVE 09/16/2019 2352   NITRITE NEGATIVE 09/16/2019 2352   LEUKOCYTESUR NEGATIVE 09/16/2019 2352   Sepsis Labs Invalid input(s): PROCALCITONIN,  WBC,  LACTICIDVEN Microbiology Recent Results (from the past 240 hour(s))  Surgical PCR screen     Status: None   Collection Time: 09/21/19 10:17 PM   Specimen: Nasal Mucosa; Nasal Swab  Result Value Ref Range Status   MRSA, PCR NEGATIVE NEGATIVE Final   Staphylococcus aureus NEGATIVE NEGATIVE  Final    Comment: (NOTE) The Xpert SA Assay (FDA approved for NASAL specimens in patients 75 years of age and older), is one component of a comprehensive surveillance program. It is not intended to diagnose infection nor to guide or monitor treatment. Performed at [facility], [place] [address] [place], [place] [address]     Please note: You were cared for by a hospitalist during your hospital stay. Once you are discharged, your primary care physician will handle any further medical issues. Please note that NO REFILLS for any discharge medications will be authorized once you are discharged,  as it is imperative that you return to your primary care physician (or establish a relationship with a primary care physician if you do not have one) for your post hospital discharge needs so that they can reassess your need for medications and monitor your lab values.    Time coordinating discharge: 40 minutes  SIGNED:   Shelly Coss, MD  [facility] 09/28/2019, 12:44 PM Pager [phone]  If 7PM-7AM, please contact night-coverage [email or web address] Password [email or web address]

## 2019-09-28 NOTE — Progress Notes (Signed)
Nutrition Follow-up  DOCUMENTATION CODES:   Not applicable  INTERVENTION:   -Continue Glucerna Shake po TID, each supplement provides 220 kcal and 10 grams of protein -Continue MVI with minerals daily -Magic cup TID with meals, each supplement provides 290 kcal and 9 grams of protein  NUTRITION DIAGNOSIS:   Increased nutrient needs related to wound healing as evidenced by estimated needs.  Ongoing  GOAL:   Patient will meet greater than or equal to 90% of their needs  Progressing   MONITOR:   PO intake, Supplement acceptance, Labs, Weight trends, Skin, I & O's  REASON FOR ASSESSMENT:   Malnutrition Screening Tool    ASSESSMENT:   Justin Boone is a 68 y.o. male with medical history significant of A. fib on Eliquis, CHF, diabetes, hypertension, COPD presenting to the ED for evaluation of head injury and a large laceration to the left knee after a fall.  Patient states he has been feeling very weak and dizzy this past week.  He has noticed that his heart "beats and then stops beating for a long time."  Today he fell twice and injured his left knee and left shoulder.  During the second fall he even hit his head and lost consciousness.  He takes metoprolol and Eliquis.  Not sure which other medications he takes as he does not have them with him at this time.  His cardiologist is outside of [place] and he cannot recall the name at this time.  States he has been told that he has problems with aorta and another heart valve.  Reports having chronic shortness of breath and cough secondary to COPD, no recent change.  Denies chest pain.  Denies fevers, nausea, vomiting, abdominal pain, or diarrhea.  4/14- s/p pacemaker  Reviewed I/O's: +227 ml x 24 hours and -7.4 L since admission  UOP: 1.7 L x 24 hours  Attempted to speak with pt via phone, however, no answer.   Pt intake is variable; noted meal completion 25-75%. He is mostly accepting Glucerna supplements, however, refused last  night's dose.   Per TOC team, plan home with home health at pt's son's home near [place].  Labs reviewed: CBGS: 84-133 (inpatient orders for glycemic control are 0-9 units insulin aspart TID with meals).   Diet Order:   Diet Order            Diet Heart Room service appropriate? Yes; Fluid consistency: Thin  Diet effective now        Diet - low sodium heart healthy              EDUCATION NEEDS:   Education needs have been addressed  Skin:  Skin Assessment: Skin Integrity Issues: Skin Integrity Issues:: Other (Comment) Other: lt knee laceration  Last BM:  09/26/19  Height:   Ht Readings from Last 1 Encounters:  09/16/19 6' (1.829 m)    Weight:   Wt Readings from Last 1 Encounters:  09/28/19 92.5 kg    Ideal Body Weight:  80.9 kg  BMI:  Body mass index is 27.67 kg/m.  Estimated Nutritional Needs:   Kcal:  2000-2200  Protein:  105-120 grams  Fluid:  > 2 L    Loistine Chance, RD, LDN, [place] Registered Dietitian II Certified Diabetes Care and Education Specialist Please refer to [facility] for RD and/or RD on-call/weekend/after hours pager

## 2019-10-01 ENCOUNTER — Telehealth: Payer: Self-pay | Admitting: Cardiology

## 2019-10-01 NOTE — Telephone Encounter (Signed)
I spoke with patient who gave me permission to speak with his son.  They are calling to clarify amiodarone dose is 400 mg twice daily.  I confirmed this is the correct dose.  Son reports we need to call pharmacy to confirm with them.  I spoke with pharmacist. She will fill for one month supply but would like clarification if this will be long term. Will need new prescription sent in if patient to continue this dose past one month

## 2019-10-01 NOTE — Telephone Encounter (Signed)
New Message      Pt c/o medication issue:  1. Name of Medication: amiodarone (PACERONE) 400 MG tablet  2. How are you currently taking this medication (dosage and times per day)?  Pt was taking 400 mg  1 x daily   3. Are you having a reaction (difficulty breathing--STAT)? No   4. What is your medication issue? Pts son is calling and says they were getting the prescription from the pharmacy and they were concerned with the change. It is now prescribed as 400 mg 2 x daily   Please call to confirm with the pharmacy  [facility], [place] - [place] [address]. Phone:  [phone]  Fax:  [phone]   Please call pts son to confirm

## 2019-10-02 NOTE — Telephone Encounter (Signed)
P  thanks  it will not be long term Amio 400 bid x 2 weeks Then 400 mg daily x 4 weeks Thanks SK

## 2019-10-05 ENCOUNTER — Ambulatory Visit (INDEPENDENT_AMBULATORY_CARE_PROVIDER_SITE_OTHER): Payer: Medicare HMO | Admitting: *Deleted

## 2019-10-05 ENCOUNTER — Ambulatory Visit (HOSPITAL_COMMUNITY)
Admission: RE | Admit: 2019-10-05 | Discharge: 2019-10-05 | Disposition: A | Discharge status: Home / Self Care | Payer: Medicare HMO | Source: Ambulatory Visit | Attending: Internal Medicine | Admitting: Internal Medicine

## 2019-10-05 ENCOUNTER — Other Ambulatory Visit: Payer: Self-pay

## 2019-10-05 DIAGNOSIS — R001 Bradycardia, unspecified: Secondary | ICD-10-CM

## 2019-10-05 DIAGNOSIS — I5022 Chronic systolic (congestive) heart failure: Secondary | ICD-10-CM

## 2019-10-05 DIAGNOSIS — R9431 Abnormal electrocardiogram [ECG] [EKG]: Secondary | ICD-10-CM

## 2019-10-05 DIAGNOSIS — T829XXA Unspecified complication of cardiac and vascular prosthetic device, implant and graft, initial encounter: Secondary | ICD-10-CM | POA: Diagnosis present

## 2019-10-05 NOTE — Patient Instructions (Addendum)
Call office for any signs of infection or increased swelling at wound site.Go to [name] main entrance to have chest x-ray today. [phone]

## 2019-10-06 ENCOUNTER — Telehealth: Payer: Self-pay

## 2019-10-06 MED ORDER — AMIODARONE HCL 400 MG PO TABS: ORAL_TABLET | ORAL | 1 refills | Status: AC

## 2019-10-06 NOTE — Progress Notes (Signed)
Wound check appointment. Steri-strips removed. Wound without redness or edema. Incision edges approximated, wound well healed. Normal device function. Thresholds, sensing, and impedances consistent with implant measurements in RA and RV leads. Device programmed at 3.5V/auto capture programmed in RA and RV for extra safety margin until 3 month visit. LV lead programmed LV3 to LV 4 with no capture at 5 V, at implant threshold was < 2 @ 0.4 ms. Industry present to run vector express. Per Dr Lovena Le LV programmed LV1 to Can. Threshold LV1 to can  was 1.0 V at 0.8 ms, output programmed at 1.5V at 0.8 ms per Dr Lovena Le and industry. Patient experienced diaphramatic STIM at > 4.25 V. Histogram distribution appropriate for patient and level of activity. No mode switches or high ventricular rates noted. Patient educated about wound care, arm mobility, lifting restrictions. ROV in 3 months with implanting physician. Patient sent for chest x-ray to evaluate lead position.

## 2019-10-06 NOTE — Telephone Encounter (Signed)
Attempted to call pt and pt's son re: clarification of Amiodarone dose by Dr Caryl Comes. No answer and unable to leave voicemail d/t it being full.  Correct dosing updated in pt's medications with no print update sent to pharmacy.  Pt is scheduled to see Dr Curt Bears 10/07/2019.  Spoke with Trinidad Curet, RN and advised of Amiodarone dosage pt should be taking per Dr Caryl Comes.  She will discuss with pt at pt's appointment with Dr Curt Bears.

## 2019-10-06 NOTE — Telephone Encounter (Signed)
Follow up:    Patient son is calling to see the status of his fathers x-ray. Please call the patient son.

## 2019-10-06 NOTE — Telephone Encounter (Signed)
Patient made aware that chest x-ray read by Dr Lovena Le presents with possible lead displacement. Patient reports diaphramatic STIM and will see Dr Curt Bears tomorrow at 1215 to assess device an d programming. Industry will be notified and present for appointment.

## 2019-10-06 NOTE — Telephone Encounter (Signed)
The pt son called stating the pt had to go get an x-ray to look at his leads. The pt felts some "jolts" spasms in his chest.  He felt it in the middle of the night and a little more this morning. The pt states it feels like a hiccup but more intense. It do not happen when he sitting up but only when he laying down. The best numbers to reach the pt and his son is [phone], and [phone]. I had the pt send a manual transmission and it was received 10/06/2019. I told him the nurse will review the transmission and give him a call back.

## 2019-10-07 ENCOUNTER — Ambulatory Visit (INDEPENDENT_AMBULATORY_CARE_PROVIDER_SITE_OTHER): Payer: Medicare HMO | Admitting: Cardiology

## 2019-10-07 ENCOUNTER — Other Ambulatory Visit: Payer: Self-pay

## 2019-10-07 ENCOUNTER — Encounter: Payer: Self-pay | Admitting: Cardiology

## 2019-10-07 VITALS — BP 122/68 | HR 68 | Ht 66.0 in | Wt 161.7 lb

## 2019-10-07 DIAGNOSIS — I350 Nonrheumatic aortic (valve) stenosis: Secondary | ICD-10-CM

## 2019-10-07 DIAGNOSIS — I5022 Chronic systolic (congestive) heart failure: Secondary | ICD-10-CM | POA: Diagnosis not present

## 2019-10-07 LAB — CUP PACEART INCLINIC DEVICE CHECK
Battery Remaining Longevity: 105 mo
Battery Voltage: 3.2 V
Brady Statistic AP VP Percent: 76.89 %
Brady Statistic AP VS Percent: 0.27 %
Brady Statistic AS VP Percent: 5.11 %
Brady Statistic AS VS Percent: 17.73 %
Brady Statistic RA Percent Paced: 78.36 %
Brady Statistic RV Percent Paced: 82 %
Date Time Interrogation Session: 20210427152300
Implantable Lead Implant Date: 20210414
Implantable Lead Implant Date: 20210414
Implantable Lead Implant Date: 20210414
Implantable Lead Location: 753858
Implantable Lead Location: 753859
Implantable Lead Location: 753860
Implantable Lead Model: 5076
Implantable Lead Model: 5076
Implantable Pulse Generator Implant Date: 20210414
Lead Channel Impedance Value: 304 Ohm
Lead Channel Impedance Value: 342 Ohm
Lead Channel Impedance Value: 342 Ohm
Lead Channel Impedance Value: 418 Ohm
Lead Channel Impedance Value: 418 Ohm
Lead Channel Impedance Value: 437 Ohm
Lead Channel Impedance Value: 437 Ohm
Lead Channel Impedance Value: 513 Ohm
Lead Channel Impedance Value: 532 Ohm
Lead Channel Impedance Value: 627 Ohm
Lead Channel Impedance Value: 665 Ohm
Lead Channel Impedance Value: 684 Ohm
Lead Channel Impedance Value: 741 Ohm
Lead Channel Impedance Value: 779 Ohm
Lead Channel Pacing Threshold Amplitude: 0.5 V
Lead Channel Pacing Threshold Amplitude: 0.75 V
Lead Channel Pacing Threshold Amplitude: 1 V
Lead Channel Pacing Threshold Pulse Width: 0.4 ms
Lead Channel Pacing Threshold Pulse Width: 0.4 ms
Lead Channel Pacing Threshold Pulse Width: 0.8 ms
Lead Channel Sensing Intrinsic Amplitude: 1 mV
Lead Channel Sensing Intrinsic Amplitude: 7.875 mV
Lead Channel Setting Pacing Amplitude: 1.75 V
Lead Channel Setting Pacing Amplitude: 3.5 V
Lead Channel Setting Pacing Amplitude: 3.5 V
Lead Channel Setting Pacing Pulse Width: 0.4 ms
Lead Channel Setting Pacing Pulse Width: 0.8 ms
Lead Channel Setting Sensing Sensitivity: 1.2 mV

## 2019-10-07 NOTE — Progress Notes (Signed)
Electrophysiology Office Note   Date:  10/07/2019   ID:  Justin Boone, DOB Oct 22, 1950, MRN [id number]  PCP:  Verdell Carmine., MD  Cardiologist:  Preston Fleeting Electrophysiologist: Caryl Comes    Chief Complaint: pacemaker   History of Present Illness: Justin Boone is a 69 y.o. male who is being seen today for the evaluation of pacemaker issue at the request of No ref. provider found. Presenting today for electrophysiology evaluation.  He has a history of atrial fibrillation, bradycardia, CHF, type 2 diabetes, hypertension.  He is status post Medtronic CRT P implanted 09/22/2019.  He also has severe aortic stenosis and has plans for TAVR implant.  Today, he denies symptoms of palpitations, chest pain, shortness of breath, orthopnea, PND, lower extremity edema, claudication, dizziness, presyncope, syncope, bleeding, or neurologic sequela. The patient is tolerating medications without difficulties.  He presents today for work-up of his CRT-P.  He has been having jumping in his chest.  This appears to be due to diaphragmatic stim.  In testing his LV lead, he has stimulation off of LV pole #1 higher outputs.  Aside from that he has done well.  He would prefer to have his aortic valve T[facility] done at [facility] instead of at [facility].   Past Medical History:  Diagnosis Date  . A-fib ([place])   . Bradycardia 09/17/2019  . CHF (congestive heart failure) ([place])   . Diabetes mellitus type II, controlled ([place])   . HTN (hypertension)    Past Surgical History:  Procedure Laterality Date  . BIV PACEMAKER INSERTION CRT-P N/A 09/22/2019   Procedure: BIV PACEMAKER INSERTION CRT-P;  Surgeon: Deboraha Sprang, MD;  Location: [place] [facility];  Service: Cardiovascular;  Laterality: N/A;  . CHOLECYSTECTOMY    . SHOULDER SURGERY       Current Outpatient Medications  Medication Sig Dispense Refill  . albuterol (PROVENTIL) (2.5 MG/3ML) 0.083% nebulizer solution Inhale 3 mLs into the lungs every 6 (six) hours as  needed for shortness of breath or wheezing.    Marland Kitchen allopurinol (ZYLOPRIM) 300 MG tablet Take 300 mg by mouth daily.    Marland Kitchen amiodarone (PACERONE) 400 MG tablet Take 1 tablet (400mg ) by mouth twice daily x 2 weeks Then decrease to 1 tablet (400mg ) daily x 4 weeks 60 tablet 1  . atorvastatin (LIPITOR) 80 MG tablet Take 80 mg by mouth daily.    . clopidogrel (PLAVIX) 75 MG tablet Take 75 mg by mouth daily.    . DULoxetine (CYMBALTA) 60 MG capsule Take 60 mg by mouth daily.    . furosemide (LASIX) 80 MG tablet Take 1 tablet (80 mg total) by mouth 2 (two) times daily. 60 tablet 1  . metoprolol succinate (TOPROL-XL) 50 MG 24 hr tablet Take 50 mg by mouth daily.    . tamsulosin (FLOMAX) 0.4 MG CAPS capsule Take 0.4 mg by mouth daily.    . TRELEGY ELLIPTA 100-62.5-25 MCG/INH AEPB Inhale 1 puff into the lungs daily.    Marland Kitchen warfarin (COUMADIN) 3 MG tablet Take 1 tablet (3 mg total) by mouth daily. Please cancel the earlier prescription for aspirin 30 tablet 1   No current facility-administered medications for this visit.    Allergies:   Contrast media [iodinated diagnostic agents] and Sulfa antibiotics   Social History:  The patient  reports that he has been smoking cigarettes. He has a 25.00 pack-year smoking history. He has never used smokeless tobacco. He reports that he does not drink alcohol or use drugs.  Family History:  The patient's family history includes Heart disease in his father and mother; Stroke in his father.    ROS:  Please see the history of present illness.   Otherwise, review of systems is positive for none.   All other systems are reviewed and negative.    PHYSICAL EXAM: VS:  BP 122/68   Pulse 68   Ht 5\' 6"  (1.676 m)   Wt 161 lb 11.2 oz (73.3 kg)   SpO2 95%   BMI 26.10 kg/m  , BMI Body mass index is 26.1 kg/m. GEN: Well nourished, well developed, in no acute distress  HEENT: normal  Neck: no JVD, carotid bruits, or masses Cardiac: RRR; no murmurs, rubs, or gallops,no edema   Respiratory:  clear to auscultation bilaterally, normal work of breathing GI: soft, nontender, nondistended, + BS MS: no deformity or atrophy  Skin: warm and dry, device pocket is well healed Neuro:  Strength and sensation are intact Psych: euthymic mood, full affect  EKG:  EKG is not ordered today. Personal review of the ekg ordered 09/23/19 shows AV paced  Device interrogation is reviewed today in detail.  See PaceArt for details.   Recent Labs: 09/16/2019: ALT 15; TSH 3.253 09/26/2019: Magnesium 2.5 09/27/2019: Hemoglobin 10.0; Platelets 151 09/28/2019: BUN 56; Creatinine, Ser 2.33; Potassium 4.3; Sodium 141    Lipid Panel  No results found for: CHOL, TRIG, HDL, CHOLHDL, VLDL, LDLCALC, LDLDIRECT   Wt Readings from Last 3 Encounters:  10/07/19 161 lb 11.2 oz (73.3 kg)  09/28/19 204 lb (92.5 kg)      Other studies Reviewed: Additional studies/ records that were reviewed today include: TTE 09/17/2019 Review of the above records today demonstrates:  1. Left ventricular ejection fraction, by estimation, is 40 to 45%. The  left ventricle has mildly decreased function. The left ventricle  demonstrates global hypokinesis. The left ventricular internal cavity size  was mildly dilated. Left ventricular  diastolic parameters are consistent with Grade II diastolic dysfunction  (pseudonormalization). Elevated left atrial pressure.  2. Right ventricular systolic function is mildly reduced. The right  ventricular size is mildly enlarged. There is moderately elevated  pulmonary artery systolic pressure.  3. Left atrial size was mildly dilated.  4. Right atrial size was mildly dilated.  5. The mitral valve is normal in structure. Mild mitral valve  regurgitation.  6. The aortic valve is tricuspid. Aortic valve regurgitation is mild.  Severe aortic valve stenosis.  7. The inferior vena cava is dilated in size with <50% respiratory  variability, suggesting right atrial pressure of 15  mmHg.   ASSESSMENT AND PLAN:  1.  Tachybradycardia syndrome: Status post Medtronic CRT-P.  He was having diaphragmatic stim.  He was switched to LV 221 and did not have stem at 5 V.  His battery life is 4 months last at 8-1/2 years.  We Justin Boone continue with this setting.  3.  Severe aortic stenosis: Plan for TAVR.  He was initially planned for [facility]  Justin Boone discuss with TAVR physicians on bring him to our clinic.   3.  Acute on chronic systolic heart failure: Currently on metoprolol and Lasix.  Continuing to diurese well.    Current medicines are reviewed at length with the patient today.   The patient does not have concerns regarding his medicines.  The following changes were made today:  none  Labs/ tests ordered today include:  No orders of the defined types were placed in this encounter.    Disposition:  With Dr. Caryl Comes as scheduled  Signed, Justin Barbara Meredith Leeds, MD  10/07/2019 2:10 PM     [place] [address] [place] [address] [phone] (office) [phone] (fax)

## 2019-10-07 NOTE — Patient Instructions (Addendum)
Your physician has recommended you make the following change in your medication:  1. Amiodarone  Take 400 mg TWICE a day for 2 weeks, then  Take 400 mg  ONCE a day for 4 weeks, then   Stop medication

## 2019-10-13 ENCOUNTER — Telehealth: Payer: Self-pay | Admitting: Internal Medicine

## 2019-10-13 ENCOUNTER — Telehealth: Payer: Self-pay | Admitting: Cardiology

## 2019-10-13 NOTE — Telephone Encounter (Signed)
Keely from [facility] calling to find out when the patient needs his next INR check, and whether it should be done with them.

## 2019-10-13 NOTE — Telephone Encounter (Signed)
Informed home health that we do not currently follow pt's Coumadin/INR. Advised to reach out to Dr. Hubbard Robinson office in [place] to discuss. She appreciates my call and agreeable to reaching out to their office.

## 2019-10-13 NOTE — Telephone Encounter (Signed)
New message   Per the case manager kara wants to know when an INR would need to be setup. Please call to discuss.

## 2019-10-18 ENCOUNTER — Institutional Professional Consult (permissible substitution): Payer: Medicare HMO | Admitting: Cardiovascular Disease

## 2019-10-25 ENCOUNTER — Institutional Professional Consult (permissible substitution): Payer: Medicare HMO | Admitting: Cardiovascular Disease

## 2019-11-02 ENCOUNTER — Ambulatory Visit
Admission: RE | Admit: 2019-11-02 | Discharge: 2019-11-02 | Disposition: A | Discharge status: Home / Self Care | Payer: Self-pay | Source: Ambulatory Visit | Attending: Cardiovascular Disease | Admitting: Cardiovascular Disease

## 2019-11-02 ENCOUNTER — Other Ambulatory Visit: Payer: Self-pay

## 2019-11-02 DIAGNOSIS — I359 Nonrheumatic aortic valve disorder, unspecified: Secondary | ICD-10-CM

## 2019-11-12 ENCOUNTER — Other Ambulatory Visit: Payer: Self-pay

## 2019-11-12 ENCOUNTER — Ambulatory Visit (INDEPENDENT_AMBULATORY_CARE_PROVIDER_SITE_OTHER): Payer: Medicare HMO | Admitting: Cardiovascular Disease

## 2019-11-12 ENCOUNTER — Encounter: Payer: Self-pay | Admitting: Cardiovascular Disease

## 2019-11-12 VITALS — BP 100/58 | HR 98 | Ht 66.0 in | Wt 181.0 lb

## 2019-11-12 DIAGNOSIS — I350 Nonrheumatic aortic (valve) stenosis: Secondary | ICD-10-CM

## 2019-11-12 NOTE — [place] Notes (Signed)
Cardiology Office Note:    Date:  11/15/2019   ID:  Scherry Ran, DOB [birth year], MRN [id number]  PCP:  Verdell Carmine., MD  [facility] Cardiologist:  Posey Boyer, MD  [place] Electrophysiologist:  None   Referring MD: Verdell Carmine., MD   Chief Complaint  Patient presents with  . Aortic Stenosis    History of Present Illness:    Justin Boone is a 69 y.o. male presenting for evaluation of severe aortic stenosis, referred by Dr Curt Bears. The patient has undergone extensive evaluation for TAVR in the [place], but desires to have further care at [facility]. He has chronic medical problems of tobacco use, COPD, HTN, and diabetes. The patient was hospitalized at [facility] for COPD exacerbation in January 2021 and was ultimately transferred to [facility] for further care. He was in atrial fibrillation with RVR and was initiated on amiodarone. At that time he was diagnosed with severe LFLG aortic stenosis with LVEF 40%. Echo showed a mean gradient of 16 mmHg, SVI 26, AVA 0.9 square cm, and DI 0.25. His hospital course was complicated by AKI on a background of CKD stage 3. Creatinine increased from 1.5 to 3.37 mg/dL. He was ultimately cardioverted, and once creatinine improved, he underwent cardiac catheterization with PCI of the RCA and CTA studies for TAVR evaluation.   The patient was first told of a heart murmur about 2 years ago when he was followed by Dr Otho Perl. The patient is here alone today.  He has multiple cardiovascular problems including atrial fibrillation and flutter status post cardioversion, maintained on amiodarone and anticoagulated with apixaban.  He has been noted to have hypertension, type 2 diabetes, stage III chronic kidney disease, chronic systolic heart failure with LVEF about 40%, and aortic stenosis.  He presents today for further evaluation of aortic stenosis.  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  Overall he reports that he is doing much better since the pacemaker was implanted 09/22/2019 by Dr Caryl Comes. He reports much better control of his heart rate, but he continues to experience 3 or 4 episodes/day of heart palpitations associated with dyspnea at rest. Episodes are brief, generally lasting less than one minute. He has chronic exertional dyspnea with anything more than low level activity. No orthopnea, PND or lightheadedness at present. He does complain of leg swelling.   Past Medical History:  Diagnosis Date  . A-fib ([place])   . Bradycardia 09/17/2019  . CHF (congestive heart failure) ([place])   . Diabetes mellitus type II, controlled ([place])   . HTN (hypertension)     Past Surgical History:  Procedure Laterality Date  . BIV PACEMAKER INSERTION CRT-P N/A 09/22/2019   Procedure: BIV PACEMAKER INSERTION CRT-P;  Surgeon: Deboraha Sprang, MD;  Location: [place] [facility];  Service: Cardiovascular;  Laterality: N/A;  . CHOLECYSTECTOMY    . SHOULDER SURGERY      Current Medications: Current Meds  Medication Sig  . albuterol (PROVENTIL) (2.5 MG/3ML) 0.083% nebulizer solution Inhale 3 mLs into the lungs every 6 (six) hours as needed for shortness of breath or wheezing.  Marland Kitchen allopurinol (ZYLOPRIM) 300 MG tablet Take 300 mg by mouth daily.  Marland Kitchen amiodarone (PACERONE) 400 MG tablet Take 1 tablet (400mg ) by mouth twice daily x 2 weeks Then decrease to 1 tablet (400mg ) daily x 4  weeks  . atorvastatin (LIPITOR) 80 MG tablet Take 80 mg by mouth daily.  . clopidogrel (PLAVIX) 75 MG tablet Take 75 mg by mouth daily.  . DULoxetine (CYMBALTA) 60 MG capsule Take 60 mg by mouth daily.  . furosemide (LASIX) 80 MG tablet Take 1 tablet (80 mg total) by mouth 2 (two) times daily.  . metoprolol succinate (TOPROL-XL) 50 MG 24 hr tablet Take 50 mg by mouth daily.  . tamsulosin (FLOMAX) 0.4 MG  CAPS capsule Take 0.4 mg by mouth daily.  . TRELEGY ELLIPTA 100-62.5-25 MCG/INH AEPB Inhale 1 puff into the lungs daily.  Marland Kitchen warfarin (COUMADIN) 3 MG tablet Take 1 tablet (3 mg total) by mouth daily. Please cancel the earlier prescription for aspirin     Allergies:   Contrast media [iodinated diagnostic agents] and Sulfa antibiotics   Social History   Socioeconomic History  . Marital status: Single    Spouse name: Not on file  . Number of children: Not on file  . Years of education: Not on file  . Highest education level: Not on file  Occupational History  . Not on file  Tobacco Use  . Smoking status: Current Every Day Smoker    Packs/day: 0.50    Years: 50.00    Pack years: 25.00    Types: Cigarettes  . Smokeless tobacco: Never Used  Substance and Sexual Activity  . Alcohol use: Never  . Drug use: Never  . Sexual activity: Not on file  Other Topics Concern  . Not on file  Social History Narrative  . Not on file   Social Determinants of Health   Financial Resource Strain:   . Difficulty of Paying Living Expenses:   Food Insecurity:   . Worried About Charity fundraiser in the Last Year:   . Arboriculturist in the Last Year:   Transportation Needs:   . Film/video editor (Medical):   Marland Kitchen Lack of Transportation (Non-Medical):   Physical Activity:   . Days of Exercise per Week:   . Minutes of Exercise per Session:   Stress:   . Feeling of Stress :   Social Connections:   . Frequency of Communication with Friends and Family:   . Frequency of Social Gatherings with Friends and Family:   . Attends Religious Services:   . Active Member of Clubs or Organizations:   . Attends Archivist Meetings:   Marland Kitchen Marital Status:      Family History: The patient's family history includes Heart disease in his father and mother; Stroke in his father.  ROS:   Please see the history of present illness.    All other systems reviewed and are negative.  EKGs/Labs/Other  Studies Reviewed:    The following studies were reviewed today: Echocardiogram 09/17/2019: IMPRESSIONS    1. Left ventricular ejection fraction, by estimation, is 40 to 45%. The  left ventricle has mildly decreased function. The left ventricle  demonstrates global hypokinesis. The left ventricular internal cavity size  was mildly dilated. Left ventricular  diastolic parameters are consistent with Grade II diastolic dysfunction  (pseudonormalization). Elevated left atrial pressure.  2. Right ventricular systolic function is mildly reduced. The right  ventricular size is mildly enlarged. There is moderately elevated  pulmonary artery systolic pressure.  3. Left atrial size was mildly dilated.  4. Right atrial size was mildly dilated.  5. The mitral valve is normal in structure. Mild mitral valve  regurgitation.  6. The aortic valve  is tricuspid. Aortic valve regurgitation is mild.  Severe aortic valve stenosis.  7. The inferior vena cava is dilated in size with <50% respiratory  variability, suggesting right atrial pressure of 15 mmHg.   Comparison(s): No prior Echocardiogram.   Conclusion(s)/Recommendation(s): Findings are consistent with low gradient  severe aortic stenosis, but the aortic jet remains early peaking and the  absence of left ventricular hypertrophy and the presence of left  ventricular dilation suggests that left  ventricular systolic dysfunction could be due to a primary cardiomyopathy.  Consider dobutamine echocardiography.   FINDINGS  Left Ventricle: Left ventricular ejection fraction, by estimation, is 40  to 45%. The left ventricle has mildly decreased function. The left  ventricle demonstrates global hypokinesis. The left ventricular internal  cavity size was mildly dilated. There is  no left ventricular hypertrophy. Left ventricular diastolic parameters  are consistent with Grade II diastolic dysfunction (pseudonormalization).  Elevated left  atrial pressure.   Right Ventricle: The right ventricular size is mildly enlarged. No  increase in right ventricular wall thickness. Right ventricular systolic  function is mildly reduced. There is moderately elevated pulmonary artery  systolic pressure. The tricuspid  regurgitant velocity is 2.69 m/s, and with an assumed right atrial  pressure of 15 mmHg, the estimated right ventricular systolic pressure is  63.1 mmHg.   Left Atrium: Left atrial size was mildly dilated.   Right Atrium: Right atrial size was mildly dilated.   Pericardium: There is no evidence of pericardial effusion.   Mitral Valve: The mitral valve is normal in structure. There is mild  thickening of the mitral valve leaflet(s). Mild mitral valve  regurgitation.   Tricuspid Valve: The tricuspid valve is normal in structure. Tricuspid  valve regurgitation is mild.   Aortic Valve: Gradients are lower than expected due to low cardiac  output/left ventricular dysfunction. The aortic valve is tricuspid. .  There is severe thickening and severe calcifcation of the aortic valve.  Aortic valve regurgitation is mild. Aortic  regurgitation PHT measures 1066 msec. Severe aortic stenosis is present.  There is severe thickening of the aortic valve. There is severe  calcifcation of the aortic valve. Aortic valve mean gradient measures 23.2  mmHg. Aortic valve peak gradient measures  40.3 mmHg. Aortic valve area, by VTI measures 0.94 cm.   Pulmonic Valve: The pulmonic valve was grossly normal. Pulmonic valve  regurgitation is not visualized.   Aorta: The aortic root is normal in size and structure.   Venous: The inferior vena cava is dilated in size with less than 50%  respiratory variability, suggesting right atrial pressure of 15 mmHg.   IAS/Shunts: No atrial level shunt detected by color flow Doppler.   Additional Comments: There is pleural effusion in the left lateral region.     LEFT VENTRICLE  PLAX 2D    LVIDd:     5.75 cm  LVIDs:     4.40 cm  LV PW:     1.10 cm  LV IVS:    0.94 cm  LVOT diam:   2.17 cm  LV SV:     70  LV SV Index:  34  LVOT Area:   3.70 cm    LV Volumes (MOD)  LV vol d, MOD A4C: 187.0 ml  LV vol s, MOD A4C: 84.2 ml  LV SV MOD A4C:   187.0 ml   RIGHT VENTRICLE       IVC  RV S prime:   10.10 cm/s IVC diam: 2.50 cm  TAPSE (M-mode): 1.5 cm   LEFT ATRIUM      Index    RIGHT ATRIUM      Index  LA diam:   3.90 cm 1.87 cm/m RA Area:   20.50 cm  LA Vol (A2C): 43.5 ml 20.87 ml/m RA Volume:  53.30 ml 25.57 ml/m  LA Vol (A4C): 84.2 ml 40.39 ml/m  AORTIC VALVE  AV Area (Vmax):  0.91 cm  AV Area (Vmean):  0.88 cm  AV Area (VTI):   0.94 cm  AV Vmax:      317.50 cm/s  AV Vmean:     225.500 cm/s  AV VTI:      0.745 m  AV Peak Grad:   40.3 mmHg  AV Mean Grad:   23.2 mmHg  LVOT Vmax:     77.90 cm/s  LVOT Vmean:    53.875 cm/s  LVOT VTI:     0.190 m  LVOT/AV VTI ratio: 0.25  AI PHT:      1066 msec    AORTA  Ao Root diam: 3.30 cm   TRICUSPID VALVE  TR Peak grad:  28.9 mmHg  TR Vmax:    269.00 cm/s    SHUNTS  Systemic VTI: 0.19 m  Systemic Diam: 2.17 cm   CTA Abdomen/Pelvis (TAVR): Vascular access measurements: Left common iliac artery: 10 x 7 mm Left external iliac artery: 9 x 7 cm Right common iliac artery: 10 x 7 mm Right external iliac artery: 8 x 6 mm.  1. Aortic annulus diameter is 27 mm. There is dense calcification of the aortic valve with an Agatston score of 839. 2. Three-vessel coronary artery atherosclerosis. 3. Moderate biatrial dilation. 4. Please see concurrent CTA chest abdomen pelvis for more accurate evaluation of noncardiac findings  CTA Heart: VASCULAR:  Aortic annulus   -Diameter: 27 mm -Working fluoro angle (degrees)- LAO 17, Cranial 7 -Agatston valvular score (AU): 839 -Agatston valvular score (AU/cm2):  147  Right-sided SVC. Moderate biatrial dilatation. No thrombus in the left atrial appendage. Right ventricle is non-dilated. Please see contemporaneous CTA for evaluation of the pulmonary arterial system. Two right and two left pulmonary veins. Left ventricle is non-dilated. No mitral annular or LVOT calcifications.  Aortic valve is trileaflet and demonstrates mild valvular calcifications. Scattered atherosclerotic calcifications are seen within all 3 coronary arteries. The RCA arises from the right coronary cusp and the left main arises from the left coronary cusp. Right coronary stent is visualized and patent. The PDA is outside the field-of-view on the current study, but originates from the RCA when correlated with the CTA.   Cardiac Cath: FINDINGS  Hemodynamics and Left Heart Catheterization  Aortic pressure: 105/70 mm Hg (mean 86 mm Hg)  Coronary Angiography  Dominance: Right  Left Main: The left main coronary artery (LMCA) is a large-caliber vessel  that originates from the left coronary sinus. It bifurcates into the left  anterior descending (LAD) and left circumflex (LCx) arteries. There is no  angiographic evidence of significant disease in the LMCA.  LAD: The LAD is a large-caliber vessel that gives off 2 diagonal (D)  branches before it wraps around the apex. High D1 is a large-caliber,  branching vessel. D2 is a moderate-caliber vessel with a 90% stenosis in  the proximal segment. There is no other angiographic evidence of  significant disease in the LAD.  Left Circumflex: The LCx is a large-caliber vessel that gives off 3 obtuse  marginal (OM) branches and then continues as a small vessel  in the AV  groove. OM1 is a large-caliber vessel. OM2 is a moderate-caliber vessel.  OM3 is a large-caliber vessel. There is no angiographic evidence of  significant disease in the LCx.  Right Coronary: The right coronary artery (RCA) is a large-caliber vessel  originating from the  right coronary sinus. It bifurcates distally into a  large-caliber posterior descending artery (PDA) and small posterolateral  (PL) branches consistent with a right dominant system. There is a 90%  stenosis of the mRCA.   PCI: Percutaneous coronary intervention performed of the mRCA  Anticoagulants: Routine, ASA, Clopidogrel, Unfractionated Heparin Guide catheter: JR4  Guide Wire: 0.014" [place] Pre-Dilation Balloon: 2.5 mm  Stent:  Synergy 3.5x16 mm DES Post-Dilation Balloon: N/a Result:  Excellent angiographic result with TIMI 3 flow  Following the procedure, the sheath was removed, and a TR band was applied  to achieve hemostasis. The TR band was subsequently removed with staged  removal of air as per protocol. The patient tolerated the procedure well  without any complications.  IVUS An [place] IVUS catheter was advanced down the wire and used to  visualize the leftand right CIA,EIA, and CFA. There was mild plaquing  throughout the inflow vessel. No significant stenoses noted with CFA  around 7.5-8.6mm  STS Risk Calculator: STS-PROM 4.131% ARF 15% Stroke 1.2% Prolonged Ventilation: 21% Morbidity or Mortality: 33.8%  EKG:  EKG is not ordered today.  The ekg ordered 10/07/2019 demonstrates AV sequential pacing  Recent Labs: 09/16/2019: ALT 15; TSH 3.253 09/26/2019: Magnesium 2.5 09/27/2019: Hemoglobin 10.0; Platelets 151 09/28/2019: BUN 56; Creatinine, Ser 2.33; Potassium 4.3; Sodium 141  Recent Lipid Panel No results found for: CHOL, TRIG, HDL, CHOLHDL, VLDL, LDLCALC, LDLDIRECT  Physical Exam:    VS:  BP (!) 100/58   Pulse 98   Ht 5\' 6"  (1.676 m)   Wt 181 lb (82.1 kg)   SpO2 98%   BMI 29.21 kg/m     Wt Readings from Last 3 Encounters:  11/12/19 181 lb (82.1 kg)  10/07/19 161 lb 11.2 oz (73.3 kg)  09/28/19 204 lb (92.5 kg)     GEN:  Well nourished, well developed in no acute distress HEENT: Normal NECK: No JVD; No carotid bruits LYMPHATICS: No  lymphadenopathy CARDIAC: RRR, 2/6 harsh systolic murmur at the RUSB RESPIRATORY:  Clear to auscultation without rales, wheezing or rhonchi  ABDOMEN: Soft, non-tender, non-distended MUSCULOSKELETAL:  Trace bilateral pretibial edema; No deformity  SKIN: Warm and dry NEUROLOGIC:  Alert and oriented x 3 PSYCHIATRIC:  Normal affect   ASSESSMENT:    1. Nonrheumatic aortic valve stenosis    PLAN:    In order of problems listed above:  1. The patient has severe, Stage D2, symptomatic aortic stenosis with reduced LV systolic function. He exhibits NYHA functional class III symptoms of chronic diastolic heart failure. I have reviewed the natural history of aortic stenosis with the patient today. We have discussed the limitations of medical therapy and the poor prognosis associated with symptomatic aortic stenosis. We have reviewed potential treatment options, including palliative medical therapy, conventional surgical aortic valve replacement, and transcatheter aortic valve replacement. We discussed treatment options in the context of the patient's specific comorbid medical conditions. Considering his comorbid conditions and poor functional capacity, I think he would be at high-risk of surgical AVR and TAVR might be a reasonable treatment option. I have reviewed his echo and cardiac cath images today as part of his evaluation. Echo demonstrates moderate LV systolic dysfunction with LVEF 40-45%  and mild LV dilatation, severe calcification and restriction of the aortic valve leaflets, with peak and mean gradients of 40 and 23 mmHg, respectively. Calculated AVA is 0.9 square cm and DI is 0.25. Cardiac cath shows tight stenosis of the mid-RCA, treated successful with a DES, and severe stenosis of a medium caliber diagonal branch with recommendation for medical therapy. The left main, LAD, and LCx have mild nonobstructive stenosis. CTA reports are reviewed but I am unable to review the images today. It appears that  the patient has suitable vascular anatomy for transfemoral TAVR. I have recommended Heart Valve Team review of the patient's imaging studies. Following this, the patient will be referred for formal cardiac surgical consultation as part of a multidisciplinary approach to his care. We discussed the importance of ongoing medical therapy for maintenance of sinus rhythm, warfarin for prevention of thromboembolism, and clopidogrel after recent stent placement. I demonstrated the TAVR procedural animation to the patient and reviewed potential risks, benefits, and alternatives to TAVR. He is eager to proceed as soon as possible - CTA review and surgical consultation will be done at the nearest available times. Extensive data is reviewed as part of this evaluation with 75 minutes spent, over half in face-to-face counseling and review of treatment options with the patient.    Medication Adjustments/Labs and Tests Ordered: Current medicines are reviewed at length with the patient today.  Concerns regarding medicines are outlined above.  No orders of the defined types were placed in this encounter.  No orders of the defined types were placed in this encounter.   Patient Instructions  Medication Instructions:  Your physician recommends that you continue on your current medications as directed. Please refer to the Current Medication list given to you today.  *If you need a refill on your cardiac medications before your next appointment, please call your pharmacy*   Lab Work: None today  If you have labs (blood work) drawn today and your tests are completely normal, you will receive your results only by: Marland Kitchen MyChart Message (if you have MyChart) OR . A paper copy in the mail If you have any lab test that is abnormal or we need to change your treatment, we will call you to review the results.   Testing/Procedures: None   Follow-Up:  A member of the Structural Team will contact you about next steps.  We  will appointments arranged to see the surgeon and then get in contact with you.   Other Instructions       Signed, Sherren Mocha, MD  11/15/2019 6:08 AM    [place]

## 2019-11-12 NOTE — Patient Instructions (Signed)
Medication Instructions:  Your physician recommends that you continue on your current medications as directed. Please refer to the Current Medication list given to you today.  *If you need a refill on your cardiac medications before your next appointment, please call your pharmacy*   Lab Work: None today  If you have labs (blood work) drawn today and your tests are completely normal, you will receive your results only by: Marland Kitchen MyChart Message (if you have MyChart) OR . A paper copy in the mail If you have any lab test that is abnormal or we need to change your treatment, we will call you to review the results.   Testing/Procedures: None   Follow-Up:  A member of the Structural Team will contact you about next steps.  We will appointments arranged to see the surgeon and then get in contact with you.   Other Instructions

## 2019-11-16 ENCOUNTER — Telehealth: Payer: Self-pay | Admitting: Cardiovascular Disease

## 2019-11-16 ENCOUNTER — Other Ambulatory Visit: Payer: Self-pay

## 2019-11-16 ENCOUNTER — Telehealth: Payer: Self-pay

## 2019-11-16 DIAGNOSIS — I350 Nonrheumatic aortic (valve) stenosis: Secondary | ICD-10-CM

## 2019-11-16 NOTE — Telephone Encounter (Signed)
Dr. Maudie Mercury requests a call from Dr. Burt Knack to discuss Justin Boone.  Phone: [phone]  The scheduler received no more information other than MD name and phone number. I do not see anyone by that name in the patient's chart or Care Everywhere.

## 2019-11-16 NOTE — Telephone Encounter (Signed)
New message   Patient's son has question about when procedure that was discussed will be setup. Please call to discuss.

## 2019-11-16 NOTE — Telephone Encounter (Signed)
Follow up    Dr Maudie Mercury is returning call, asking to speak with Dr Burt Knack    Please call back

## 2019-11-16 NOTE — Telephone Encounter (Signed)
Attempted to call but VM is full- unable to leave message.  Will try again later.

## 2019-11-16 NOTE — Telephone Encounter (Signed)
FYI per Dr Enrique Sack: Patient just admitted with COPD exacerbation at [facility] in [place].

## 2019-11-17 ENCOUNTER — Telehealth (HOSPITAL_COMMUNITY): Payer: Self-pay

## 2019-11-17 NOTE — Telephone Encounter (Signed)
I called patient to schedule a New Outpatient exam with Dental Medicine. Patient stated that he was in [facility] being admitted to the hospital for his COPD and could not schedule at this time. Patient stated he would call Dental Medicine once discharged.

## 2019-11-18 NOTE — Telephone Encounter (Signed)
Spoke with Dr. Norva Pavlov from [facility] who called to give an update on the patient since he may undergo TAVR in the future. The patient was hospitalized 2 days ago for acutely decompensated CHF.  He is currently on amio, has an LE non healing wound, and his kidney function is worse - creatinine is now 2.1 (from 1.7 on arrival).  They have had a difficult time with no previous records on this patient with extensive cardiac history.  He states they will aim for the patient to be discharged this Saturday. Faxed Dr. Wilhemina Cash recent [facility] notes and echo.  He will send Dr. Burt Knack notes and testing from [facility] as well.  Dr. Wilhemina Cash understands a plan will be made and the patient will be contacted next week.  Dr. Baird Cancer cell is [phone] if Dr. Burt Knack has any questions.

## 2019-11-18 NOTE — Telephone Encounter (Signed)
See other 6/8 phone note.

## 2019-11-20 ENCOUNTER — Other Ambulatory Visit: Payer: Self-pay

## 2019-11-20 ENCOUNTER — Inpatient Hospital Stay (HOSPITAL_COMMUNITY)
Admit: 2019-11-20 | Discharge: 2020-01-09 | DRG: 266 | Disposition: E | Payer: Medicare HMO | Source: Other Acute Inpatient Hospital | Attending: Cardiovascular Disease | Admitting: Cardiovascular Disease

## 2019-11-20 ENCOUNTER — Encounter (HOSPITAL_COMMUNITY): Payer: Self-pay | Admitting: Internal Medicine

## 2019-11-20 DIAGNOSIS — Z954 Presence of other heart-valve replacement: Secondary | ICD-10-CM | POA: Diagnosis not present

## 2019-11-20 DIAGNOSIS — N1832 Chronic kidney disease, stage 3b: Secondary | ICD-10-CM | POA: Diagnosis not present

## 2019-11-20 DIAGNOSIS — J441 Chronic obstructive pulmonary disease with (acute) exacerbation: Secondary | ICD-10-CM | POA: Diagnosis present

## 2019-11-20 DIAGNOSIS — Z452 Encounter for adjustment and management of vascular access device: Secondary | ICD-10-CM

## 2019-11-20 DIAGNOSIS — S301XXA Contusion of abdominal wall, initial encounter: Secondary | ICD-10-CM | POA: Diagnosis present

## 2019-11-20 DIAGNOSIS — D689 Coagulation defect, unspecified: Secondary | ICD-10-CM | POA: Diagnosis not present

## 2019-11-20 DIAGNOSIS — J440 Chronic obstructive pulmonary disease with acute lower respiratory infection: Secondary | ICD-10-CM | POA: Diagnosis not present

## 2019-11-20 DIAGNOSIS — E1169 Type 2 diabetes mellitus with other specified complication: Secondary | ICD-10-CM | POA: Diagnosis not present

## 2019-11-20 DIAGNOSIS — R0602 Shortness of breath: Secondary | ICD-10-CM | POA: Diagnosis not present

## 2019-11-20 DIAGNOSIS — Z66 Do not resuscitate: Secondary | ICD-10-CM | POA: Diagnosis not present

## 2019-11-20 DIAGNOSIS — N400 Enlarged prostate without lower urinary tract symptoms: Secondary | ICD-10-CM | POA: Diagnosis present

## 2019-11-20 DIAGNOSIS — I080 Rheumatic disorders of both mitral and aortic valves: Principal | ICD-10-CM | POA: Diagnosis present

## 2019-11-20 DIAGNOSIS — Z79899 Other long term (current) drug therapy: Secondary | ICD-10-CM

## 2019-11-20 DIAGNOSIS — M25519 Pain in unspecified shoulder: Secondary | ICD-10-CM | POA: Diagnosis present

## 2019-11-20 DIAGNOSIS — N183 Chronic kidney disease, stage 3 unspecified: Secondary | ICD-10-CM | POA: Diagnosis not present

## 2019-11-20 DIAGNOSIS — I4892 Unspecified atrial flutter: Secondary | ICD-10-CM | POA: Diagnosis present

## 2019-11-20 DIAGNOSIS — Z4659 Encounter for fitting and adjustment of other gastrointestinal appliance and device: Secondary | ICD-10-CM

## 2019-11-20 DIAGNOSIS — R1011 Right upper quadrant pain: Secondary | ICD-10-CM | POA: Diagnosis present

## 2019-11-20 DIAGNOSIS — I472 Ventricular tachycardia: Secondary | ICD-10-CM | POA: Diagnosis not present

## 2019-11-20 DIAGNOSIS — E611 Iron deficiency: Secondary | ICD-10-CM | POA: Diagnosis not present

## 2019-11-20 DIAGNOSIS — I5043 Acute on chronic combined systolic (congestive) and diastolic (congestive) heart failure: Secondary | ICD-10-CM | POA: Diagnosis present

## 2019-11-20 DIAGNOSIS — Z882 Allergy status to sulfonamides status: Secondary | ICD-10-CM

## 2019-11-20 DIAGNOSIS — N179 Acute kidney failure, unspecified: Secondary | ICD-10-CM | POA: Diagnosis not present

## 2019-11-20 DIAGNOSIS — I429 Cardiomyopathy, unspecified: Secondary | ICD-10-CM | POA: Diagnosis present

## 2019-11-20 DIAGNOSIS — J9601 Acute respiratory failure with hypoxia: Secondary | ICD-10-CM

## 2019-11-20 DIAGNOSIS — S81012A Laceration without foreign body, left knee, initial encounter: Secondary | ICD-10-CM | POA: Diagnosis present

## 2019-11-20 DIAGNOSIS — Z7901 Long term (current) use of anticoagulants: Secondary | ICD-10-CM

## 2019-11-20 DIAGNOSIS — K045 Chronic apical periodontitis: Secondary | ICD-10-CM | POA: Diagnosis present

## 2019-11-20 DIAGNOSIS — R570 Cardiogenic shock: Secondary | ICD-10-CM | POA: Diagnosis not present

## 2019-11-20 DIAGNOSIS — J156 Pneumonia due to other aerobic Gram-negative bacteria: Secondary | ICD-10-CM | POA: Diagnosis not present

## 2019-11-20 DIAGNOSIS — I480 Paroxysmal atrial fibrillation: Secondary | ICD-10-CM | POA: Diagnosis present

## 2019-11-20 DIAGNOSIS — Z515 Encounter for palliative care: Secondary | ICD-10-CM | POA: Diagnosis not present

## 2019-11-20 DIAGNOSIS — I5042 Chronic combined systolic (congestive) and diastolic (congestive) heart failure: Secondary | ICD-10-CM | POA: Diagnosis not present

## 2019-11-20 DIAGNOSIS — E785 Hyperlipidemia, unspecified: Secondary | ICD-10-CM | POA: Diagnosis not present

## 2019-11-20 DIAGNOSIS — M109 Gout, unspecified: Secondary | ICD-10-CM | POA: Diagnosis present

## 2019-11-20 DIAGNOSIS — F17210 Nicotine dependence, cigarettes, uncomplicated: Secondary | ICD-10-CM | POA: Diagnosis present

## 2019-11-20 DIAGNOSIS — I4901 Ventricular fibrillation: Secondary | ICD-10-CM | POA: Diagnosis not present

## 2019-11-20 DIAGNOSIS — Z8249 Family history of ischemic heart disease and other diseases of the circulatory system: Secondary | ICD-10-CM

## 2019-11-20 DIAGNOSIS — Z952 Presence of prosthetic heart valve: Secondary | ICD-10-CM

## 2019-11-20 DIAGNOSIS — K083 Retained dental root: Secondary | ICD-10-CM | POA: Diagnosis present

## 2019-11-20 DIAGNOSIS — I2510 Atherosclerotic heart disease of native coronary artery without angina pectoris: Secondary | ICD-10-CM | POA: Diagnosis present

## 2019-11-20 DIAGNOSIS — I495 Sick sinus syndrome: Secondary | ICD-10-CM | POA: Diagnosis present

## 2019-11-20 DIAGNOSIS — R571 Hypovolemic shock: Secondary | ICD-10-CM | POA: Diagnosis not present

## 2019-11-20 DIAGNOSIS — L8990 Pressure ulcer of unspecified site, unspecified stage: Secondary | ICD-10-CM | POA: Insufficient documentation

## 2019-11-20 DIAGNOSIS — I4819 Other persistent atrial fibrillation: Secondary | ICD-10-CM | POA: Diagnosis present

## 2019-11-20 DIAGNOSIS — I130 Hypertensive heart and chronic kidney disease with heart failure and stage 1 through stage 4 chronic kidney disease, or unspecified chronic kidney disease: Secondary | ICD-10-CM | POA: Diagnosis present

## 2019-11-20 DIAGNOSIS — J9621 Acute and chronic respiratory failure with hypoxia: Secondary | ICD-10-CM | POA: Diagnosis not present

## 2019-11-20 DIAGNOSIS — D696 Thrombocytopenia, unspecified: Secondary | ICD-10-CM | POA: Diagnosis not present

## 2019-11-20 DIAGNOSIS — Y95 Nosocomial condition: Secondary | ICD-10-CM | POA: Diagnosis not present

## 2019-11-20 DIAGNOSIS — G4733 Obstructive sleep apnea (adult) (pediatric): Secondary | ICD-10-CM | POA: Diagnosis present

## 2019-11-20 DIAGNOSIS — R34 Anuria and oliguria: Secondary | ICD-10-CM | POA: Diagnosis not present

## 2019-11-20 DIAGNOSIS — K0889 Other specified disorders of teeth and supporting structures: Secondary | ICD-10-CM | POA: Diagnosis present

## 2019-11-20 DIAGNOSIS — Y829 Unspecified medical devices associated with adverse incidents: Secondary | ICD-10-CM | POA: Diagnosis present

## 2019-11-20 DIAGNOSIS — Z978 Presence of other specified devices: Secondary | ICD-10-CM

## 2019-11-20 DIAGNOSIS — I350 Nonrheumatic aortic (valve) stenosis: Secondary | ICD-10-CM

## 2019-11-20 DIAGNOSIS — Z006 Encounter for examination for normal comparison and control in clinical research program: Secondary | ICD-10-CM | POA: Diagnosis not present

## 2019-11-20 DIAGNOSIS — R0600 Dyspnea, unspecified: Secondary | ICD-10-CM

## 2019-11-20 DIAGNOSIS — I469 Cardiac arrest, cause unspecified: Secondary | ICD-10-CM | POA: Diagnosis not present

## 2019-11-20 DIAGNOSIS — Z20822 Contact with and (suspected) exposure to covid-19: Secondary | ICD-10-CM | POA: Diagnosis present

## 2019-11-20 DIAGNOSIS — E1122 Type 2 diabetes mellitus with diabetic chronic kidney disease: Secondary | ICD-10-CM | POA: Diagnosis present

## 2019-11-20 DIAGNOSIS — R110 Nausea: Secondary | ICD-10-CM | POA: Diagnosis not present

## 2019-11-20 DIAGNOSIS — Z91041 Radiographic dye allergy status: Secondary | ICD-10-CM

## 2019-11-20 DIAGNOSIS — I951 Orthostatic hypotension: Secondary | ICD-10-CM | POA: Diagnosis not present

## 2019-11-20 DIAGNOSIS — Z823 Family history of stroke: Secondary | ICD-10-CM

## 2019-11-20 DIAGNOSIS — Z950 Presence of cardiac pacemaker: Secondary | ICD-10-CM

## 2019-11-20 DIAGNOSIS — N184 Chronic kidney disease, stage 4 (severe): Secondary | ICD-10-CM | POA: Diagnosis present

## 2019-11-20 DIAGNOSIS — Z7951 Long term (current) use of inhaled steroids: Secondary | ICD-10-CM

## 2019-11-20 DIAGNOSIS — Z955 Presence of coronary angioplasty implant and graft: Secondary | ICD-10-CM

## 2019-11-20 DIAGNOSIS — I50810 Right heart failure, unspecified: Secondary | ICD-10-CM | POA: Diagnosis not present

## 2019-11-20 DIAGNOSIS — Z7902 Long term (current) use of antithrombotics/antiplatelets: Secondary | ICD-10-CM

## 2019-11-20 DIAGNOSIS — I5082 Biventricular heart failure: Secondary | ICD-10-CM | POA: Diagnosis present

## 2019-11-20 DIAGNOSIS — E861 Hypovolemia: Secondary | ICD-10-CM | POA: Diagnosis not present

## 2019-11-20 DIAGNOSIS — I509 Heart failure, unspecified: Secondary | ICD-10-CM

## 2019-11-20 DIAGNOSIS — D62 Acute posthemorrhagic anemia: Secondary | ICD-10-CM | POA: Diagnosis not present

## 2019-11-20 DIAGNOSIS — Z9889 Other specified postprocedural states: Secondary | ICD-10-CM

## 2019-11-20 DIAGNOSIS — I5023 Acute on chronic systolic (congestive) heart failure: Secondary | ICD-10-CM | POA: Diagnosis not present

## 2019-11-20 DIAGNOSIS — N170 Acute kidney failure with tubular necrosis: Secondary | ICD-10-CM | POA: Diagnosis not present

## 2019-11-20 DIAGNOSIS — K029 Dental caries, unspecified: Secondary | ICD-10-CM | POA: Diagnosis present

## 2019-11-20 DIAGNOSIS — I5033 Acute on chronic diastolic (congestive) heart failure: Secondary | ICD-10-CM | POA: Diagnosis not present

## 2019-11-20 DIAGNOSIS — S025XXA Fracture of tooth (traumatic), initial encounter for closed fracture: Secondary | ICD-10-CM | POA: Diagnosis present

## 2019-11-20 DIAGNOSIS — J449 Chronic obstructive pulmonary disease, unspecified: Secondary | ICD-10-CM | POA: Diagnosis not present

## 2019-11-20 LAB — GLUCOSE, CAPILLARY
Glucose-Capillary: 117 mg/dL — ABNORMAL HIGH (ref 70–99)
Glucose-Capillary: 157 mg/dL — ABNORMAL HIGH (ref 70–99)

## 2019-11-20 LAB — CBC
HCT: 31.2 % — ABNORMAL LOW (ref 39.0–52.0)
Hemoglobin: 9.3 g/dL — ABNORMAL LOW (ref 13.0–17.0)
MCH: 29.4 pg (ref 26.0–34.0)
MCHC: 29.8 g/dL — ABNORMAL LOW (ref 30.0–36.0)
MCV: 98.7 fL (ref 80.0–100.0)
Platelets: 198 10*3/uL (ref 150–400)
RBC: 3.16 MIL/uL — ABNORMAL LOW (ref 4.22–5.81)
RDW: 19.5 % — ABNORMAL HIGH (ref 11.5–15.5)
WBC: 9.5 10*3/uL (ref 4.0–10.5)
nRBC: 0 % (ref 0.0–0.2)

## 2019-11-20 LAB — BASIC METABOLIC PANEL
Anion gap: 9 (ref 5–15)
BUN: 37 mg/dL — ABNORMAL HIGH (ref 8–23)
CO2: 29 mmol/L (ref 22–32)
Calcium: 9 mg/dL (ref 8.9–10.3)
Chloride: 102 mmol/L (ref 98–111)
Creatinine, Ser: 2.19 mg/dL — ABNORMAL HIGH (ref 0.61–1.24)
GFR calc Af Amer: 35 mL/min — ABNORMAL LOW (ref >60)
GFR calc non Af Amer: 30 mL/min — ABNORMAL LOW (ref >60)
Glucose, Bld: 144 mg/dL — ABNORMAL HIGH (ref 70–99)
Potassium: 5.2 mmol/L — ABNORMAL HIGH (ref 3.5–5.1)
Sodium: 140 mmol/L (ref 135–145)

## 2019-11-20 LAB — SARS CORONAVIRUS 2 BY RT PCR (HOSPITAL ORDER, PERFORMED IN ~~LOC~~ HOSPITAL LAB): SARS Coronavirus 2: NEGATIVE

## 2019-11-20 LAB — PROTIME-INR
INR: 2.5 — ABNORMAL HIGH (ref 0.8–1.2)
Prothrombin Time: 25.8 seconds — ABNORMAL HIGH (ref 11.4–15.2)

## 2019-11-20 LAB — MAGNESIUM: Magnesium: 2.2 mg/dL (ref 1.7–2.4)

## 2019-11-20 LAB — BRAIN NATRIURETIC PEPTIDE: B Natriuretic Peptide: 3282.1 pg/mL — ABNORMAL HIGH (ref 0.0–100.0)

## 2019-11-20 MED ORDER — SODIUM CHLORIDE 0.9% FLUSH
3.0000 mL | Freq: Two times a day (BID) | INTRAVENOUS | Status: DC
  Administered 2019-11-21 – 2019-11-29 (×15): 3 mL via INTRAVENOUS

## 2019-11-20 MED ORDER — MELATONIN 3 MG PO TABS
3.0000 mg | ORAL_TABLET | Freq: Every evening | ORAL | Status: DC | PRN
  Administered 2019-11-30 – 2019-12-01 (×2): 3 mg via ORAL
  Filled 2019-11-20 (×3): qty 1

## 2019-11-20 MED ORDER — AMIODARONE HCL 200 MG PO TABS
400.0000 mg | ORAL_TABLET | Freq: Every day | ORAL | Status: DC
  Administered 2019-11-21 – 2019-12-05 (×15): 400 mg via ORAL
  Filled 2019-11-20 (×16): qty 2

## 2019-11-20 MED ORDER — DULOXETINE HCL 60 MG PO CPEP
60.0000 mg | ORAL_CAPSULE | Freq: Every day | ORAL | Status: DC
  Administered 2019-11-21 – 2019-12-10 (×20): 60 mg via ORAL
  Filled 2019-11-20 (×20): qty 1

## 2019-11-20 MED ORDER — TAMSULOSIN HCL 0.4 MG PO CAPS
0.4000 mg | ORAL_CAPSULE | Freq: Every day | ORAL | Status: DC
  Administered 2019-11-21 – 2019-12-13 (×23): 0.4 mg via ORAL
  Filled 2019-11-20 (×24): qty 1

## 2019-11-20 MED ORDER — WARFARIN - PHARMACIST DOSING INPATIENT: Freq: Every day | Status: DC

## 2019-11-20 MED ORDER — WARFARIN SODIUM 2 MG PO TABS: 2.0000 mg | ORAL_TABLET | Freq: Once | ORAL | Status: DC

## 2019-11-20 MED ORDER — METOPROLOL SUCCINATE ER 50 MG PO TB24
50.0000 mg | ORAL_TABLET | Freq: Every day | ORAL | Status: DC
  Administered 2019-11-21 – 2019-11-29 (×9): 50 mg via ORAL
  Filled 2019-11-20 (×10): qty 1

## 2019-11-20 MED ORDER — METHYLPREDNISOLONE SODIUM SUCC 125 MG IJ SOLR
60.0000 mg | Freq: Two times a day (BID) | INTRAMUSCULAR | Status: DC
  Administered 2019-11-20 – 2019-11-21 (×2): 60 mg via INTRAVENOUS
  Filled 2019-11-20 (×2): qty 2

## 2019-11-20 MED ORDER — CLOPIDOGREL BISULFATE 75 MG PO TABS
75.0000 mg | ORAL_TABLET | Freq: Every day | ORAL | Status: DC
  Administered 2019-11-21 – 2019-12-13 (×21): 75 mg via ORAL
  Filled 2019-11-20 (×22): qty 1

## 2019-11-20 MED ORDER — ATORVASTATIN CALCIUM 80 MG PO TABS
80.0000 mg | ORAL_TABLET | Freq: Every day | ORAL | Status: DC
  Administered 2019-11-21 – 2019-12-13 (×22): 80 mg via ORAL
  Filled 2019-11-20 (×22): qty 1

## 2019-11-20 MED ORDER — MOMETASONE FURO-FORMOTEROL FUM 100-5 MCG/ACT IN AERO
2.0000 | INHALATION_SPRAY | Freq: Two times a day (BID) | RESPIRATORY_TRACT | Status: DC
  Administered 2019-11-20 – 2019-12-12 (×40): 2 via RESPIRATORY_TRACT
  Filled 2019-11-20 (×2): qty 8.8

## 2019-11-20 MED ORDER — IPRATROPIUM-ALBUTEROL 0.5-2.5 (3) MG/3ML IN SOLN
3.0000 mL | Freq: Four times a day (QID) | RESPIRATORY_TRACT | Status: AC
  Administered 2019-11-20 – 2019-11-22 (×6): 3 mL via RESPIRATORY_TRACT
  Filled 2019-11-20 (×6): qty 3

## 2019-11-20 MED ORDER — WARFARIN SODIUM 3 MG PO TABS
3.0000 mg | ORAL_TABLET | Freq: Once | ORAL | Status: AC
  Administered 2019-11-20: 3 mg via ORAL
  Filled 2019-11-20: qty 1

## 2019-11-20 MED ORDER — NICOTINE 21 MG/24HR TD PT24
21.0000 mg | MEDICATED_PATCH | Freq: Every day | TRANSDERMAL | Status: DC
  Administered 2019-11-21 – 2019-12-13 (×23): 21 mg via TRANSDERMAL
  Filled 2019-11-20 (×23): qty 1

## 2019-11-20 NOTE — [place] Notes (Signed)
Pt placed on cpap 6 (per pt states home setting is 5 or 6). Pt states he feels he is getting adequate air/breath.  No distress noted, pt states he is comfortable.

## 2019-11-20 NOTE — H&P (Addendum)
History and Physical    Justin Boone VEL:[id number] DOB: 12-30-1950 DOA: 11/25/2019  PCP: Verdell Carmine., MD  Patient coming from: [facility]   I have personally briefly reviewed patient's old medical records in [place]  Chief Complaint: Transferred from [facility] for specialty consultation with Cardiology and Nephrology  HPI: Justin Boone is a 69 y.o. male with medical history significant of atrial fibrillation on coumadin, bradycardia s/p medtronic CRT-P, CHF, type 2 diabetes, hypertension, severe AS, chronic kidney disease stage III, gout, COPD, hyperlipidemia, CAD s/p PCI, tobacco abuse initially presented on 11/15/2019 with worsening dyspnea.    He was treated for decompensated heart failure and COPD exacerbation with IV diuretics and steroids (p.o. prednisone 80 mg daily)/antibiotics (ceftriaxone).  His renal function worsened with increasing creatinine from 1.7 on 11/15/2019 to 2.1 on 11/18/2019 with IV diuresis (Lasix 80 mg IV twice daily).  CT chest without contrast done on 11/15/2019 showed right greater than left pleural effusion from apex to base with compressive volume loss of right middle lobe.  He underwent thoracentesis on 11/16/2019 with 1.1 L removed, awaiting on pleural fluid studies to calculate lights criteria.  Echocardiogram on 2/58/5277 showed LV systolic function at lower limits of normal with estimated EF of 50 to 55%, abnormal left ventricular diastolic filling, multiple regional wall motion abnormalities, RVSP estimated to be 55 to 60 mmHg, moderate AAS, moderate to severe MR, greater than 50% respiratory change in IVC dimension though study was technically difficult  Other pertinent labs at outside hospital: 11/18/2019 Hemoglobin 9.3 Troponin negative  INR 2.2 BUN 31 Potassium 3.9 Sodium 140 Bicarb 28  Today, patient reports he has had worsening dyspnea for the last 2 weeks but since admission to the hospital this has much  improved.  Reports orthopnea, PND, lightheadedness, worsening lower extremity edema and near syncope prior to admission.  Denies chest pain, palpitations, fever, syncope.  Reports worsening cough with whitish expectoration since thoracentesis.  Reports medication compliance.  Smokes a pack of cigarettes per day which he decreased from 2 packs/day 2 months ago.  Denies cocaine, marijuana, alcohol use.  At baseline, he is able to ambulate without any physical support.  Does not use oxygen at baseline. Reports he has been discussing TAVR with Dr. Burt Knack.  Review of Systems: As per HPI otherwise 10 point review of systems negative.   Past Medical History:  Diagnosis Date  . A-fib ([place])   . Bradycardia 09/17/2019  . CHF (congestive heart failure) ([place])   . Diabetes mellitus type II, controlled ([place])   . HTN (hypertension)     Past Surgical History:  Procedure Laterality Date  . BIV PACEMAKER INSERTION CRT-P N/A 09/22/2019   Procedure: BIV PACEMAKER INSERTION CRT-P;  Surgeon: Deboraha Sprang, MD;  Location: [facility];  Service: Cardiovascular;  Laterality: N/A;  . CHOLECYSTECTOMY    . SHOULDER SURGERY       reports that he has been smoking cigarettes. He has a 25.00 pack-year smoking history. He has never used smokeless tobacco. He reports that he does not drink alcohol and does not use drugs.  Allergies  Allergen Reactions  . Contrast Media [Iodinated Diagnostic Agents]   . Sulfa Antibiotics     Family History  Problem Relation Age of Onset  . Heart disease Mother   . Heart disease Father   . Stroke Father     Prior to Admission medications   Medication Sig Start Date End Date Taking? Authorizing Provider  albuterol (PROVENTIL) (  2.5 MG/3ML) 0.083% nebulizer solution Inhale 3 mLs into the lungs every 6 (six) hours as needed for shortness of breath or wheezing. 08/27/19   [provider]  allopurinol (ZYLOPRIM) 300 MG tablet Take 300 mg by mouth daily. 08/20/19    [provider]  amiodarone (PACERONE) 400 MG tablet Take 1 tablet (400mg ) by mouth twice daily x 2 weeks Then decrease to 1 tablet (400mg ) daily x 4 weeks 10/06/19   Deboraha Sprang, MD  atorvastatin (LIPITOR) 80 MG tablet Take 80 mg by mouth daily. 08/20/19   [provider]  clopidogrel (PLAVIX) 75 MG tablet Take 75 mg by mouth daily. 08/12/19   [provider]  DULoxetine (CYMBALTA) 60 MG capsule Take 60 mg by mouth daily. 07/30/19   [provider]  furosemide (LASIX) 80 MG tablet Take 1 tablet (80 mg total) by mouth 2 (two) times daily. 09/28/19   Shelly Coss, MD  metoprolol succinate (TOPROL-XL) 50 MG 24 hr tablet Take 50 mg by mouth daily. 08/20/19   [provider]  tamsulosin (FLOMAX) 0.4 MG CAPS capsule Take 0.4 mg by mouth daily. 07/30/19   [provider]  TRELEGY ELLIPTA 100-62.5-25 MCG/INH AEPB Inhale 1 puff into the lungs daily. 08/27/19   [provider]  warfarin (COUMADIN) 3 MG tablet Take 1 tablet (3 mg total) by mouth daily. Please cancel the earlier prescription for aspirin 09/28/19 11/27/19  Shelly Coss, MD    Physical Exam: Vitals:   11/10/2019 1851  BP: 123/80  Pulse: 64  Resp: 18  Temp: (!) 97.4 F (36.3 C)  TempSrc: Oral  SpO2: 97%  Weight: 82.8 kg  Height: 5\' 6"  (1.676 m)    Constitutional: NAD, calm, comfortable Vitals:   11/18/2019 1851  BP: 123/80  Pulse: 64  Resp: 18  Temp: (!) 97.4 F (36.3 C)  TempSrc: Oral  SpO2: 97%  Weight: 82.8 kg  Height: 5\' 6"  (1.676 m)   Eyes: PERRL, lids and conjunctivae normal ENMT: Tongue is dry.  Poor dentition.  No thrush Neck: normal Respiratory: Diffuse expiratory wheezing in all lung fields Cardiovascular: Regular rate and rhythm, trace bilateral lower extremity edema. 2+ pedal pulses.  Abdomen: no tenderness, no masses palpated. No hepatosplenomegaly. Bowel sounds positive.  Musculoskeletal: no clubbing / cyanosis. No joint deformity upper and lower  extremities.  Skin: Left shin and knee wound bandaged Neurologic: CN 2-12 grossly intact. Strength 5/5 in all 4.  Psychiatric: Normal judgment and insight. Alert and oriented x 3. Normal mood.   Labs on Admission: I have personally reviewed following labs and imaging studies on outside hospital records  CBC: No results for input(s): WBC, NEUTROABS, HGB, HCT, MCV, PLT in the last 168 hours. Basic Metabolic Panel: No results for input(s): NA, K, CL, CO2, GLUCOSE, BUN, CREATININE, CALCIUM, MG, PHOS in the last 168 hours. GFR: CrCl cannot be calculated (Patient's most recent lab result is older than the maximum 21 days allowed.). Liver Function Tests: No results for input(s): AST, ALT, ALKPHOS, BILITOT, PROT, ALBUMIN in the last 168 hours. No results for input(s): LIPASE, AMYLASE in the last 168 hours. No results for input(s): AMMONIA in the last 168 hours. Coagulation Profile: No results for input(s): INR, PROTIME in the last 168 hours. Cardiac Enzymes: No results for input(s): CKTOTAL, CKMB, CKMBINDEX, TROPONINI in the last 168 hours. BNP (last 3 results) No results for input(s): PROBNP in the last 8760 hours. HbA1C: No results for input(s): HGBA1C in the last 72 hours. CBG: Recent  Labs  Lab 11/14/2019 1849  GLUCAP 117*   Lipid Profile: No results for input(s): CHOL, HDL, LDLCALC, TRIG, CHOLHDL, LDLDIRECT in the last 72 hours. Thyroid Function Tests: No results for input(s): TSH, T4TOTAL, FREET4, T3FREE, THYROIDAB in the last 72 hours. Anemia Panel: No results for input(s): VITAMINB12, FOLATE, FERRITIN, TIBC, IRON, RETICCTPCT in the last 72 hours. Urine analysis:    Component Value Date/Time   COLORURINE YELLOW 09/16/2019 2352   APPEARANCEUR CLEAR 09/16/2019 2352   LABSPEC 1.010 09/16/2019 2352   PHURINE 5.0 09/16/2019 2352   GLUCOSEU NEGATIVE 09/16/2019 2352   HGBUR NEGATIVE 09/16/2019 2352   BILIRUBINUR NEGATIVE 09/16/2019 2352   KETONESUR NEGATIVE 09/16/2019 2352    PROTEINUR NEGATIVE 09/16/2019 2352   NITRITE NEGATIVE 09/16/2019 2352   LEUKOCYTESUR NEGATIVE 09/16/2019 2352    Radiological Exams on Admission: No results found.  EKG: Independently reviewed.  Assessment/Plan Principal Problem:   Congestive heart failure (CHF) (HCC) Active Problems:   Paroxysmal atrial fibrillation (HCC)   COPD with acute exacerbation (HCC)   AKI (acute kidney injury) ([place])   Severe aortic stenosis   CAD (coronary artery disease)   Type 2 diabetes mellitus with hyperlipidemia (HCC)   CKD (chronic kidney disease), stage III  Decompensated diastolic heart failure Echo at outside hospital showed diastolic dysfunction, LVEF 50 to 55% S/p IV diuretics, therapeutic thoracentesis He appears euvolemic at this time, will hold off on further diuresis especially given concern for renal function. BMP, BNP, magnesium pending Follows with Dr. Burt Knack.  Ongoing discussion about TAVR. Consult cardiology in a.m.  Acute exacerbation of presumed obstructive airway disease Not on home oxygen.  Unclear if he has had PFTs Diffuse wheezing on exam, cough with expectoration  Was on prednisone 80 mg daily at outside hospital, will increase this to methylprednisolone 60 mg IV twice daily On antibiotics at outside hospital, will discontinue this given no obvious signs of infection Scheduled duo nebs, LABA  AKI on CKD stage IIIb Baseline creatinine based on labs visible in epic ~2. On Care Everywhere, last creatinine on 11/04/2019 at [facility] 1.64, GFR 43 Per outside hospital records, creatinine on admission 1.7 which increased to 2.1 with diuresis BMP pending Avoid nephrotoxic drugs, hold off on diuresis Monitor I/O  CAD s/p PCI Continue clopidogrel, atorvastatin, metoprolol  Paroxysmal atrial fibrillation On Coumadin.  INR therapeutic 11/18/2019 at 2.2 Coumadin per pharmacy Daily INR Continue home amiodarone at 400 mg daily  Type 2 diabetes mellitus Last A1c  5.3 Not on medications at home  Severe aortic stenosis Per chart review and patient, ongoing discussion with Dr. Burt Knack for TAVR Cardiology consult in a.m.  Tobacco abuse Extensive h/o tobacco smoking  Appears motivated to quit Agreeable to nicotine replacement  Nicotine patch ordered   Of note, he is not vaccinated against COVID-19 and is interested in getting vaccinated if available inpatient.   DVT prophylaxis: [id number] Code Status: Full code (discussed with patient during this admission, he states if he does not do well for 30 days on the ventilator, he would like cease all care-he has expressed his wish to his elder son [name].  Notes he has written his directives but is pending notarization)  Family Communication: Discussed plan of care with patient (First point of contact per patient is his eldest son [name])   Disposition Plan: Needs inpatient stay for further evaluation by specialty care   Consults called: None. Needs nephrology, Cardiology consult in AM Admission status: Inpatient   Lucky Cowboy MD [facility]  If 7PM-7AM, please contact night-coverage 11/19/2019, 7:06 PM

## 2019-11-20 NOTE — Progress Notes (Signed)
ANTICOAGULATION CONSULT NOTE - Initial Consult  Pharmacy Consult for Warfarin Indication: atrial fibrillation  Allergies  Allergen Reactions  . Contrast Media [Iodinated Diagnostic Agents]   . Sulfa Antibiotics     Patient Measurements: Height: 5\' 6"  (167.6 cm) Weight: 82.8 kg (182 lb 9.6 oz) (scale b) IBW/kg (Calculated) : 63.8  Vital Signs: Temp: 97.4 F (36.3 C) (06/12 1851) Temp Source: Oral (06/12 1851) BP: 123/80 (06/12 1851) Pulse Rate: 64 (06/12 1851)  Labs: No results for input(s): HGB, HCT, PLT, APTT, LABPROT, INR, HEPARINUNFRC, HEPRLOWMOCWT, CREATININE, CKTOTAL, CKMB, TROPONINIHS in the last 72 hours.  CrCl cannot be calculated (Patient's most recent lab result is older than the maximum 21 days allowed.).   Medical History: Past Medical History:  Diagnosis Date  . A-fib ([place])   . Bradycardia 09/17/2019  . CHF (congestive heart failure) ([place])   . Diabetes mellitus type II, controlled ([place])   . HTN (hypertension)     Assessment: 68 YOM transferred from OSH to [facility] on 6/12 for cardiology and nephrology consults. The patient was noted to be on warfarin PTA for hx Afib - admit INR 2.5. Last dose at OSH was 4.5 mg on 6/11. Pharmacy consulted to dose warfarin this admission.   The OSH records indicate that the patient was on 3 mg daily except for 6 mg on MWF however the patient endorses 2 mg daily except for 3 mg on MWF. Upon questioning - he appeared uncertain of his regimen and did endorse the 3mg /6mg  regimen as well. Will only give 3 mg this evening and will attempt to clarify the warfarin dosing with the patient's son tomorrow.   Goal of Therapy:  INR 2-3 Monitor platelets by anticoagulation protocol: Yes   Plan:  - Warfarin 3 mg x 1 dose today - Daily PT/INR, CBC q72h - Will continue to monitor for any signs/symptoms of bleeding and will follow up with PT/INR in the a.m.    Thank you for allowing pharmacy to be a part of this patient's care.  Alycia Rossetti, PharmD, BCPS Clinical Pharmacist Clinical phone for 12/07/2019: [phone] 12/08/2019 9:14 PM   **Pharmacist phone directory can now be found on [email or web address] (PW [email or web address]).  Listed under [place].

## 2019-11-21 ENCOUNTER — Inpatient Hospital Stay (HOSPITAL_COMMUNITY): Payer: Medicare HMO

## 2019-11-21 ENCOUNTER — Other Ambulatory Visit: Payer: Self-pay

## 2019-11-21 DIAGNOSIS — E1169 Type 2 diabetes mellitus with other specified complication: Secondary | ICD-10-CM

## 2019-11-21 DIAGNOSIS — J441 Chronic obstructive pulmonary disease with (acute) exacerbation: Secondary | ICD-10-CM

## 2019-11-21 DIAGNOSIS — I5043 Acute on chronic combined systolic (congestive) and diastolic (congestive) heart failure: Secondary | ICD-10-CM

## 2019-11-21 DIAGNOSIS — E785 Hyperlipidemia, unspecified: Secondary | ICD-10-CM

## 2019-11-21 LAB — CBC
HCT: 30 % — ABNORMAL LOW (ref 39.0–52.0)
Hemoglobin: 9 g/dL — ABNORMAL LOW (ref 13.0–17.0)
MCH: 29.7 pg (ref 26.0–34.0)
MCHC: 30 g/dL (ref 30.0–36.0)
MCV: 99 fL (ref 80.0–100.0)
Platelets: 192 10*3/uL (ref 150–400)
RBC: 3.03 MIL/uL — ABNORMAL LOW (ref 4.22–5.81)
RDW: 19.3 % — ABNORMAL HIGH (ref 11.5–15.5)
WBC: 6.4 10*3/uL (ref 4.0–10.5)
nRBC: 0.3 % — ABNORMAL HIGH (ref 0.0–0.2)

## 2019-11-21 LAB — BASIC METABOLIC PANEL
Anion gap: 11 (ref 5–15)
BUN: 43 mg/dL — ABNORMAL HIGH (ref 8–23)
CO2: 26 mmol/L (ref 22–32)
Calcium: 8.9 mg/dL (ref 8.9–10.3)
Chloride: 102 mmol/L (ref 98–111)
Creatinine, Ser: 2 mg/dL — ABNORMAL HIGH (ref 0.61–1.24)
GFR calc Af Amer: 39 mL/min — ABNORMAL LOW (ref >60)
GFR calc non Af Amer: 34 mL/min — ABNORMAL LOW (ref >60)
Glucose, Bld: 173 mg/dL — ABNORMAL HIGH (ref 70–99)
Potassium: 5 mmol/L (ref 3.5–5.1)
Sodium: 139 mmol/L (ref 135–145)

## 2019-11-21 LAB — GLUCOSE, CAPILLARY: Glucose-Capillary: 148 mg/dL — ABNORMAL HIGH (ref 70–99)

## 2019-11-21 LAB — PROTIME-INR
INR: 2.6 — ABNORMAL HIGH (ref 0.8–1.2)
Prothrombin Time: 27.2 seconds — ABNORMAL HIGH (ref 11.4–15.2)

## 2019-11-21 MED ORDER — PREDNISONE 20 MG PO TABS
40.0000 mg | ORAL_TABLET | Freq: Every day | ORAL | Status: DC
  Administered 2019-11-22 – 2019-11-25 (×5): 40 mg via ORAL
  Filled 2019-11-21 (×5): qty 2

## 2019-11-21 MED ORDER — WARFARIN SODIUM 3 MG PO TABS: 3.0000 mg | ORAL_TABLET | Freq: Once | ORAL | Status: DC

## 2019-11-21 MED ORDER — FUROSEMIDE 10 MG/ML IJ SOLN
80.0000 mg | Freq: Once | INTRAMUSCULAR | Status: AC
  Administered 2019-11-21: 80 mg via INTRAVENOUS
  Filled 2019-11-21 (×2): qty 8

## 2019-11-21 NOTE — Progress Notes (Signed)
PROGRESS NOTE  Justin Boone TIR:[id number] DOB: 02-12-1951 DOA: 11/10/2019 PCP: Verdell Carmine., MD   LOS: 1 day   Brief Narrative / Interim history: 69 year old male with history of A. fib on Coumadin, CAD, bradycardia status post Medtronic pacer, DM 2, hypertension, severe AS, COPD, hypertension, chronic kidney disease stage IIIb with creatinine 1.9-2.3 is admitted to Justin Boone is a hospital to hospital transfer from Justin Boone for cardiology evaluation.  He is followed by Dr. Burt Knack as an outpatient for evaluation for TAVR.  Apparently has had extensive evaluation at [facility] but patient desired to have care done at [facility].  He was admitted at OSH for the past 5 days, where he was given IV diuresis and underwent a right-sided thoracentesis for symptomatic right-sided pleural effusion.  Subjective / 24h Interval events: He tells me he is feeling better than when he arrived to the hospital however he is still quite short of breath with minimal activity and he is barely able to ambulate without being short of breath or getting chest tightness.  Assessment & Plan: Principal Problem Acute on chronic diastolic CHF-he was admitted to the OSH 6 days ago.  He has been diuresed with IV Lasix, and upon increasing creatinine his IV Lasix has been held.  He also underwent right-sided therapeutic thoracentesis.  Currently he does appear mildly fluid overloaded however effusion.  Hold off further diuresis while trending creatinine.  Active Problems Symptomatic severe aortic stenosis-cardiology consulted, appreciate input  COPD exacerbation-Per report, he was on steroids due to significant wheezing at the outside hospital.  He is not wheezing currently, I will convert his Solu-Medrol to prednisone.  Continue nebulizers  CAD-no chest pain, this is stable.  Has history of PCI.  Continue metoprolol, atorvastatin, Plavix, Coumadin  History of A. fib/flutter, PPM in place-continue  amiodarone, anticoagulated with Coumadin  Essential hypertension-continue metoprolol  Chronic kidney disease stage IIIb-Baseline creatinine 1.9-2.3, currently at baseline.  Type 2 diabetes mellitus -Last A1c 5.3, not on medications  Tobacco use-a pack a day.  Counseled for cessation   Scheduled Meds: . amiodarone  400 mg Oral Daily  . atorvastatin  80 mg Oral q1800  . clopidogrel  75 mg Oral Daily  . DULoxetine  60 mg Oral Daily  . ipratropium-albuterol  3 mL Nebulization Q6H  . methylPREDNISolone (SOLU-MEDROL) injection  60 mg Intravenous BID  . metoprolol succinate  50 mg Oral Daily  . mometasone-formoterol  2 puff Inhalation BID  . nicotine  21 mg Transdermal Daily  . sodium chloride flush  3 mL Intravenous Q12H  . tamsulosin  0.4 mg Oral QPC breakfast  . warfarin  3 mg Oral ONCE-1600  . Warfarin - Pharmacist Dosing Inpatient   Does not apply q1600   Continuous Infusions: PRN Meds:.melatonin  DVT prophylaxis: Coumadin Code Status: Full code Family Communication: no family at bedside  Status is: Inpatient  Remains inpatient appropriate because:Transfer from OSH for cardiology evaluation, awaiting cardiology input.  Severely symptomatic CHF type symptoms due to critical AS   Dispo: The patient is from: Home              Anticipated d/c is to: Home              Anticipated d/c date is: > 3 days              Patient currently is not medically stable to d/c.       Consultants:  Cardiology   Procedures:  2D echo:  Echocardiogram on 02/22/7828 showed LV systolic function at lower limits of normal with estimated EF of 50 to 55%, abnormal left ventricular diastolic filling, multiple regional wall motion abnormalities, RVSP estimated to be 55 to 60 mmHg, moderate AAS, moderate to severe MR, greater than 50% respiratory change in IVC dimension though study was technically difficult  Microbiology  None   Antimicrobials: None     Objective: Vitals:   11/21/19 0026  11/21/19 0302 11/21/19 0729 11/21/19 0755  BP: 129/82 107/78 118/77   Pulse: 66 63 68   Resp: 18 17 18    Temp: 98 F (36.7 C) 98.3 F (36.8 C) 97.9 F (36.6 C)   TempSrc: Oral Oral Oral   SpO2: 100% 100% 94% 95%  Weight:  81.9 kg    Height:        Intake/Output Summary (Last 24 hours) at 11/21/2019 1018 Last data filed at 11/21/2019 0839 Gross per 24 hour  Intake 240 ml  Output --  Net 240 ml   Filed Weights   11/13/2019 1851 11/21/19 0302  Weight: 82.8 kg 81.9 kg    Examination:  Constitutional: No apparent distress, sitting in the edge of the bed and eating breakfast Eyes: no scleral icterus ENMT: Mucous membranes are moist.  Neck: normal, supple Respiratory: Bibasilar crackles, diminished overall, no wheezing Cardiovascular: Regular rate and rhythm, 3/6 SEM, 1+ edema Abdomen: non distended, no tenderness. Bowel sounds positive.  Musculoskeletal: no clubbing / cyanosis.  Skin: no rashes Neurologic: Nonfocal, equal strength  Data Reviewed: I have independently reviewed following labs and imaging studies   CBC: Recent Labs  Lab 11/20/2019 2002 11/21/19 0903  WBC 9.5 6.4  HGB 9.3* 9.0*  HCT 31.2* 30.0*  MCV 98.7 99.0  PLT 198 562   Basic Metabolic Panel: Recent Labs  Lab 11/11/2019 2002 11/21/19 0903  NA 140 139  K 5.2* 5.0  CL 102 102  CO2 29 26  GLUCOSE 144* 173*  BUN 37* 43*  CREATININE 2.19* 2.00*  CALCIUM 9.0 8.9  MG 2.2  --    Liver Function Tests: No results for input(s): AST, ALT, ALKPHOS, BILITOT, PROT, ALBUMIN in the last 168 hours. Coagulation Profile: Recent Labs  Lab 11/28/2019 2011 11/21/19 0903  INR 2.5* 2.6*   HbA1C: No results for input(s): HGBA1C in the last 72 hours. CBG: Recent Labs  Lab 11/11/2019 1849 12/02/2019 2153 11/21/19 0621  GLUCAP 117* 157* 148*    Recent Results (from the past 240 hour(s))  SARS Coronavirus 2 by RT PCR (hospital order, performed in [facility] hospital lab) Nasopharyngeal Nasopharyngeal Swab      Status: None   Collection Time: 11/27/2019  8:36 PM   Specimen: Nasopharyngeal Swab  Result Value Ref Range Status   SARS Coronavirus 2 NEGATIVE NEGATIVE Final    Comment: (NOTE) SARS-CoV-2 target nucleic acids are NOT DETECTED.  The SARS-CoV-2 RNA is generally detectable in upper and lower respiratory specimens during the acute phase of infection. The lowest concentration of SARS-CoV-2 viral copies this assay can detect is 250 copies / mL. A negative result does not preclude SARS-CoV-2 infection and should not be used as the sole basis for treatment or other patient management decisions.  A negative result may occur with improper specimen collection / handling, submission of specimen other than nasopharyngeal swab, presence of viral mutation(s) within the areas targeted by this assay, and inadequate number of viral copies (<250 copies / mL). A negative result must be combined with clinical observations, patient history, and epidemiological  information.  Fact Sheet for Patients:   StrictlyIdeas.no  Fact Sheet for Healthcare Providers: BankingDealers.co.za  This test is not yet approved or  cleared by the [place] FDA and has been authorized for detection and/or diagnosis of SARS-CoV-2 by FDA under an Emergency Use Authorization (EUA).  This EUA will remain in effect (meaning this test can be used) for the duration of the COVID-19 declaration under Section 564(b)(1) of the Act, 21 U.S.C. section 360bbb-3(b)(1), unless the authorization is terminated or revoked sooner.  Performed at [facility], [address], [place], [place] [address]      Radiology Studies: No results found.  CT chest without contrast done on 11/15/2019 showed right greater than left pleural effusion from apex to base with compressive volume loss of right middle lobe.  He underwent thoracentesis on 11/16/2019 with 1.1 L removed, awaiting on pleural fluid  studies to calculate lights criteria.   Marzetta Board, MD, PhD [facility]  Between 7 am - 7 pm I am available, please contact me via Amion or Securechat  Between 7 pm - 7 am I am not available, please contact night coverage MD/APP via Amion

## 2019-11-21 NOTE — Progress Notes (Addendum)
ANTICOAGULATION CONSULT NOTE - Initial Consult  Pharmacy Consult for Warfarin Indication: atrial fibrillation  Allergies  Allergen Reactions  . Contrast Media [Iodinated Diagnostic Agents]   . Sulfa Antibiotics     Patient Measurements: Height: 5\' 6"  (167.6 cm) Weight: 81.9 kg (180 lb 9.6 oz) IBW/kg (Calculated) : 63.8  Vital Signs: Temp: 97.9 F (36.6 C) (06/13 0729) Temp Source: Oral (06/13 0729) BP: 118/77 (06/13 0729) Pulse Rate: 68 (06/13 0729)  Labs: Recent Labs    12/03/2019 2002 11/19/2019 2011 11/21/19 0903  HGB 9.3*  --  9.0*  HCT 31.2*  --  30.0*  PLT 198  --  192  LABPROT  --  25.8* 27.2*  INR  --  2.5* 2.6*  CREATININE 2.19*  --   --     Estimated Creatinine Clearance: 32.9 mL/min (A) (by C-G formula based on SCr of 2.19 mg/dL (H)).   Medical History: Past Medical History:  Diagnosis Date  . A-fib ([place])   . Bradycardia 09/17/2019  . CHF (congestive heart failure) ([place])   . Diabetes mellitus type II, controlled ([place])   . HTN (hypertension)     Assessment: 20 YOM transferred from OSH to [facility] on 6/12 for cardiology and nephrology consults. The patient was noted to be on warfarin PTA for hx Afib - admit INR 2.5. Last dose at OSH was 4.5 mg on 6/11. Pharmacy consulted to dose warfarin this admission.   6/12 OSH records state home dose as  6mg  on Wed and 3mg  all other days. Patient states home dose is 2 mg TTSS and 3 mg on MWF. Upon questioning, he appeared uncertain of his regimen and did endorse the 3mg /6mg  regimen as well. Will give 3 mg this evening and will attempt to clarify the warfarin dosing with the patient's son.   6/13 called listed cell # for son but patient picked up. Patient confirmed home dose was 2 mg TTSS and 3 mg on MWF and seemed very alert and sure. Outpatient Rx fill history includes 2 and 3mg  tablets. Last available outpatient INR was 1.8 on 11/05/19 and LPN note states that patient was instructed to increase warfarin to 6mg  on Wed and  3mg  all other days.  Unclear exact Warfarin PTA dose: Per OSH record:  6mg  on Wed and 3mg  all other days Per patient 6/13: 2 mg TTSS and 3 mg on MWF Per last outpt INR note 5/28: 6mg  on Wed and 3mg  all other days  Will dose per INR, which is therapeutic at 2.6 today.   Goal of Therapy:  INR 2-3 Monitor platelets by anticoagulation protocol: Yes   Plan:  Warfarin 3mg  x1 today Monitor daily INR, Q72 hr CBC/plt Monitor for signs/symptoms of bleeding    ADDENDUM 13:45  -Notified by Cardiology to hold warfarin for possible TAVR  -F/u INR and plan for heparin infusion when INR is less than 2   Thank you for allowing pharmacy to be a part of this patient's care.  Benetta Spar, PharmD, BCPS, BCCP Clinical Pharmacist  Please check AMION for all [place] phone numbers After 10:00 PM, call [place] [phone]

## 2019-11-21 NOTE — Consult Note (Signed)
[place] Nurse Consult Note: Reason for Consult:skin injury from fall. Last seen by this writer 7 weeks ago for similar injury. Most significant injury has been and continues to be on the left LE. Skin tears are generally managed by protocol by Bedside RNs, but this patient is known to me. Wound type:Trauma Pressure Injury POA:N/A Measurement: To be entered onto Nursing Flow Sheet today by Bedside RN, [place] Clap Wound bed:red, moist Drainage (amount, consistency, odor) small amount serosanguinous Periwound: ecchymosis, edema Dressing procedure/placement/frequency: I will provide Nursing staff with guidance via the Orders for the topical care of this wound using non-adherent securement methods.  We do not carry the stockinet (or Tubigrip)  that he has been using outside of hospital, but will secure using a few turns of Kerlix roll gauze and will not place tape on skin.  [place] nursing team will not follow, but will remain available to this patient, the nursing and medical teams.  Please re-consult if needed. Thanks, Maudie Flakes, MSN, RN, [place], Arther Abbott  Pager# [phone]

## 2019-11-21 NOTE — Consult Note (Addendum)
Cardiology Consultation:   Justin Boone ID: Justin Boone MRN: [id number]; DOB: June 08, 1951  Admit date: 12/07/2019 Date of Consult: 11/21/2019  Primary Care Provider: Verdell Carmine., [place] [facility] Cardiologist: Posey Boyer, [place] [facility]), Sherren Mocha, [place]  [facility] Electrophysiologist:  Allegra Lai, [place]   Justin Boone Profile:   Justin Boone is a 69 y.o. 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 Cruzita Lederer.  History of Present Illness:   Justin Boone is a 69 year old male with the above history who was previously followed by Justin Boone in [facility] but has more recently seen Justin Boone at [facility] and Justin Boone for tachybrady syndrome.  Justin Boone was admitted at [facility] in 06/2019 for COPD exacerbation and was ultimately transferred to [facility] for further care. He was in atrial fibrillation with RVR and was started on Amiodarone. At that time, he was diagnosed with severe low flow low gradient aortic stenosis with LVEF of 40%. Echo showed a mean gradient of 16 mmHg, SVI 26, AVA 0.9 square cm, and DI 0.25. His hospital course was complicated by AKI on CKD stage III. Creatinine peaked at 3.37 (baseline 1.5). He was ultimately cardioverted and once creatinine improved, he underwent cardiac catheterization with PCI/DES of RCA (also noted to have 90% stenosis of diagonal but this was medically treated) and CTA studies for TAVR evaluation. 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 He was admitted to [facility] in 09/2019 for syncope. He was noted to be bradycardic and in atrial flutter with rates in the 40's. Echo at that time showed LVEF of 40-45% with global hypokinesis, grade 2 diastolic dysfunction, severe AS, and mild MR. RV noted to be mildly enlarged with mildly reduced systolic function. He ended up having Medtronic CRT-P placed on 09/22/2019 and now follows with Justin Boone for this. He was referred to Justin Boone and saw him on 11/12/2019 because he wished to have further care at [facility]. Justin Boone was planning for additional CTA and formal cardiac surgical consultation as part of TAVR work-up.  Justin Boone presented to [facility] on 11/15/2019 with worsening shortness of breath and productive cough for the past few days.  He also describes orthopnea and PND.  O2 sats were apparently 85% on room air at home per home health care so he was told to go to the ED for further evaluation.  In the ED, O2 sats 88% on room air.  He was found to have severe bilateral wheezing but JVD was also elevated.  Chest x-ray showed increased opacity in the lower third right chest with greater subpulmonic pleural effusion and fluid in the right minor fissure as well as left-sided pleural effusion with the side was improved from prior imaging in 09/2019.  Chest CT showed bilateral pleural effusions (right greater than left) and compressive volume loss of right middle lobe complete volume loss of the right lower lobe.  Labs showed troponin T 0.02, pro BNP 22,658, WBC of 6.6, Hgb 9.0,  creatinine 1.7, K 3.2.  He was admitted with acute hypoxic respiratory failure secondary to COPD exacerbation and acute CHF.  Noted on IV Lasix 80mg  twice daily, IV steroids, DuoNeb, and ceftriaxone. Echo on 11/26/2019 showed LVEF of 50-55% with multiple regional wall motion abnormalities, moderate aortic stenosis with mean gradient of 26.91 mmHg and peak instantaneous gradient of  45.93 mmHg, moderate to severe MR, mild TR mild to moderate TR, and elevated RV SP of 55-9mmHg.  He underwent right-sided thoracentesis on 11/16/2019 with removal of 1.1 L of fluid.  Pleural fluid sent for analysis but was pending at time of transfer.  Creatinine increased from 1.7-2.1 with diuresis.  Therefore decision was made to transfer Justin Boone to [name] where he can have cardiology and nephrology.  At the time of this evaluation, Justin Boone resting comfortably in no acute distress.  He is no longer on supplemental O2.  Breathing has significantly improved since presentation but is still not back to baseline.  He still has significant wheezing on exam.  He describes vague chest discomfort along sternum and under right breast especially when he coughs however he also states he occasionally has chest discomfort with exertion.  He also notes occasional palpitations with associated lightheadedness and dizziness but no recurrent syncope.  He has denies any fevers does note occasional chills.  He also reports some nausea and some diarrhea prior to presentation.  Nausea still comes and goes but he has not had a bowel movement the past several days.  No abnormal bleeding hemoptysis, hematochezia, melena, hematuria.  Justin Boone states he is compliant with all of his medications.    Past Medical History:  Diagnosis Date  . A-fib ([place])   . Bradycardia 09/17/2019  . CHF (congestive heart failure) ([place])   . Diabetes mellitus type II, controlled ([place])   . HTN (hypertension)     Past Surgical History:  Procedure Laterality Date  . BIV PACEMAKER INSERTION CRT-P N/A 09/22/2019   Procedure: BIV PACEMAKER INSERTION CRT-P;  Surgeon: Deboraha Sprang, [place];  Location: [facility];  Service: Cardiovascular;  Laterality: N/A;  . CHOLECYSTECTOMY    . SHOULDER SURGERY       Home Medications:  Prior to Admission medications   Medication Sig Start Date End Date Taking? Authorizing Provider  albuterol (PROVENTIL)  (2.5 MG/3ML) 0.083% nebulizer solution Inhale 3 mLs into the lungs every 6 (six) hours as needed for shortness of breath or wheezing. 08/27/19  Yes Provider, Historical, [place]  albuterol (VENTOLIN HFA) 108 (90 Base) MCG/ACT inhaler Inhale 2 puffs into the lungs every 6 (six) hours as needed for wheezing or shortness of breath.   Yes Provider, Historical, [place]  allopurinol (ZYLOPRIM) 300 MG tablet Take 300 mg by mouth daily. 08/20/19  Yes Provider, Historical, [place]  amiodarone (PACERONE) 400 MG tablet Take 1 tablet (400mg ) by mouth twice daily x 2 weeks Then decrease to 1 tablet (400mg ) daily x 4 weeks Justin Boone taking differently: Take 400 mg by mouth daily.  10/06/19  Yes Deboraha Sprang, [place]  atorvastatin (LIPITOR) 80 MG tablet Take 80 mg by mouth daily. 08/20/19  Yes Provider, Historical, [place]  clopidogrel (PLAVIX) 75 MG tablet Take 75 mg by mouth daily. 08/12/19  Yes Provider, Historical, [place]  DULoxetine (CYMBALTA) 60 MG capsule Take 60 mg by mouth daily. 07/30/19  Yes Provider, Historical, [place]  furosemide (LASIX) 80 MG tablet Take 1 tablet (80 mg total) by mouth 2 (two) times daily. 09/28/19  Yes Shelly Coss, [place]  metoprolol  succinate (TOPROL-XL) 50 MG 24 hr tablet Take 50 mg by mouth daily. 08/20/19  Yes Provider, Historical, [place]  pantoprazole (PROTONIX) 40 MG tablet Take 40 mg by mouth daily.   Yes Provider, Historical, [place]  tamsulosin (FLOMAX) 0.4 MG CAPS capsule Take 0.4 mg by mouth daily. 07/30/19  Yes Provider, Historical, [place]  TRELEGY ELLIPTA 100-62.5-25 MCG/INH AEPB Inhale 1 puff into the lungs daily. 08/27/19  Yes Provider, Historical, [place]  warfarin (COUMADIN) 2 MG tablet Take 2 mg by mouth See admin instructions. Tuesday, Thursday, Saturday, and Sunday evenings   Yes Provider, Historical, [place]  warfarin (COUMADIN) 3 MG tablet Take 1 tablet (3 mg total) by mouth daily. Please cancel the earlier prescription for aspirin Justin Boone taking differently: Take 3 mg by mouth every Monday, Wednesday, and Friday.  09/28/19  11/27/19 Yes Shelly Coss, [place]    Inpatient Medications: Scheduled Meds: . amiodarone  400 mg Oral Daily  . atorvastatin  80 mg Oral q1800  . clopidogrel  75 mg Oral Daily  . DULoxetine  60 mg Oral Daily  . ipratropium-albuterol  3 mL Nebulization Q6H  . methylPREDNISolone (SOLU-MEDROL) injection  60 mg Intravenous BID  . metoprolol succinate  50 mg Oral Daily  . mometasone-formoterol  2 puff Inhalation BID  . nicotine  21 mg Transdermal Daily  . sodium chloride flush  3 mL Intravenous Q12H  . tamsulosin  0.4 mg Oral QPC breakfast  . warfarin  3 mg Oral ONCE-1600  . Warfarin - Pharmacist Dosing Inpatient   Does not apply q1600   Continuous Infusions:  PRN Meds: melatonin  Allergies:    Allergies  Allergen Reactions  . Contrast Media [Iodinated Diagnostic Agents]   . Sulfa Antibiotics     Social History:   Social History   Socioeconomic History  . Marital status: Single    Spouse name: Not on file  . Number of children: Not on file  . Years of education: Not on file  . Highest education level: Not on file  Occupational History  . Not on file  Tobacco Use  . Smoking status: Current Every Day Smoker    Packs/day: 0.50    Years: 50.00    Pack years: 25.00    Types: Cigarettes  . Smokeless tobacco: Never Used  Substance and Sexual Activity  . Alcohol use: Never  . Drug use: Never  . Sexual activity: Not on file  Other Topics Concern  . Not on file  Social History Narrative  . Not on file   Social Determinants of Health   Financial Resource Strain:   . Difficulty of Paying Living Expenses:   Food Insecurity:   . Worried About Charity fundraiser in the Last Year:   . Arboriculturist in the Last Year:   Transportation Needs:   . Film/video editor (Medical):   Marland Kitchen Lack of Transportation (Non-Medical):   Physical Activity:   . Days of Exercise per Week:   . Minutes of Exercise per Session:   Stress:   . Feeling of Stress :   Social Connections:   .  Frequency of Communication with Friends and Family:   . Frequency of Social Gatherings with Friends and Family:   . Attends Religious Services:   . Active Member of Clubs or Organizations:   . Attends Archivist Meetings:   Marland Kitchen Marital Status:   Intimate Partner Violence:   . Fear of Current or Ex-Partner:   . Emotionally Abused:   .  Physically Abused:   . Sexually Abused:     Family History:    Family History  Problem Relation Age of Onset  . Heart disease Mother   . Heart disease Father   . Stroke Father      ROS:  Please see the history of present illness.  All other ROS reviewed and negative.     Physical Exam/Data:   Vitals:   11/21/19 0026 11/21/19 0302 11/21/19 0729 11/21/19 0755  BP: 129/82 107/78 118/77   Pulse: 66 63 68   Resp: 18 17 18    Temp: 98 F (36.7 Justin) 98.3 F (36.8 Justin) 97.9 F (36.6 Justin)   TempSrc: Oral Oral Oral   SpO2: 100% 100% 94% 95%  Weight:  81.9 kg    Height:        Intake/Output Summary (Last 24 hours) at 11/21/2019 1028 Last data filed at 11/21/2019 0839 Gross per 24 hour  Intake 240 ml  Output --  Net 240 ml   Last 3 Weights 11/21/2019 11/29/2019 11/12/2019  Weight (lbs) 180 lb 9.6 oz 182 lb 9.6 oz 181 lb  Weight (kg) 81.92 kg 82.827 kg 82.101 kg     Body mass index is 29.15 kg/m.  General: 69 y.o. male resting comfortably in no acute distress. HEENT: Normocephalic and atraumatic. Sclera clear.  Neck: Supple. Bilateral carotid bruits but think this is radiation from aortic stenosis. Distended neck veins but difficult to appreciate whether he actually has JVD. Heart: RRR. Distinct S1 and S2. No murmurs, gallops, or rubs. Radial and distal pedal pulses 2+ and equal bilaterally. Lungs: No increased work of breathing. Diffuse expiratory wheeze bilaterally with some crackles noted in left lower base.  Abdomen: Soft, non-distended, and non-tender to palpation. Bowel sounds present. MSK: Normal strength and tone for age. Extremities:  No lower extremity edema.    Skin: Warm and dry. Neuro: Alert and oriented x3. No focal deficits. Psych: Normal affect. Responds appropriately.  EKG:  The EKG was personally reviewed and demonstrates:  AV paced. Telemetry:  Telemetry was personally reviewed and demonstrates:  AV paced.  Relevant CV Studies:  Echocardiogram 09/17/2019: Impressions: 1. Left ventricular ejection fraction, by estimation, is 40 to 45%. The  left ventricle has mildly decreased function. The left ventricle  demonstrates global hypokinesis. The left ventricular internal cavity size  was mildly dilated. Left ventricular  diastolic parameters are consistent with Grade II diastolic dysfunction  (pseudonormalization). Elevated left atrial pressure.  2. Right ventricular systolic function is mildly reduced. The right  ventricular size is mildly enlarged. There is moderately elevated  pulmonary artery systolic pressure.  3. Left atrial size was mildly dilated.  4. Right atrial size was mildly dilated.  5. The mitral valve is normal in structure. Mild mitral valve  regurgitation.  6. The aortic valve is tricuspid. Aortic valve regurgitation is mild.  Severe aortic valve stenosis.  7. The inferior vena cava is dilated in size with <50% respiratory  variability, suggesting right atrial pressure of 15 mmHg.   Laboratory Data:  High Sensitivity Troponin:  No results for input(s): TROPONINIHS in the last 720 hours.   Chemistry Recent Labs  Lab 11/09/2019 2002 11/21/19 0903  NA 140 139  K 5.2* 5.0  CL 102 102  CO2 29 26  GLUCOSE 144* 173*  BUN 37* 43*  CREATININE 2.19* 2.00*  CALCIUM 9.0 8.9  GFRNONAA 30* 34*  GFRAA 35* 39*  ANIONGAP 9 11    No results for input(s): PROT, ALBUMIN, AST,  ALT, ALKPHOS, BILITOT in the last 168 hours. Hematology Recent Labs  Lab 11/18/2019 2002 11/21/19 0903  WBC 9.5 6.4  RBC 3.16* 3.03*  HGB 9.3* 9.0*  HCT 31.2* 30.0*  MCV 98.7 99.0  MCH 29.4 29.7  MCHC 29.8*  30.0  RDW 19.5* 19.3*  PLT 198 192   BNP Recent Labs  Lab 11/15/2019 2002  BNP 3,282.1*    DDimer No results for input(s): DDIMER in the last 168 hours.   Radiology/Studies:  No results found. {  Assessment and Plan:   Acute on Chronic Combined CHF - Admitted to outside hospital on 6/7 with acute hypoxic respiratory failure secondary to COPD exacerbation and acute CHF. Diuresed there and underwent right sided thoracentesis on 6/8 with removal 1.1 L of fluid. Creatinine started to rise so he was transferred to [facility] for [facility] evaluation.  - BNP 3,282 here.  - Echo from 09/2019 shows LVEF of 40-45% with global hypokinesis and grade 2 diastolic dysfunction. Repeat Echo at outside hospital showed LVEF of 50-55%. - Will give one dose of IV Lasix 80mg  daily to see how he responds.  - NO ACEi/ARB due to renal function - Continue Toprol 50mg  daily. - Continue to monitor  daily weight, strict I/O's, and renal function.  Severe Aortic Stenosis - Currently being worked up for TAVR.  - Can have structural heart team see tomorrow.  CAD - S/p PCI to RCA in 06/2019.  - Describes vague chest pain especially with coughing and occasional exertional pain.  - Troponin negative at outside hospital. - EKG AV paced.  - Continue Plavix, beta-blocker, and high-intensity statin. Not on Aspirin due to Coumadin.   Atrial Fibrillation/Flutter and Tachybrady Syndrome s/p PPM - s/p Medtronic CRT-P in 09/2019. - Currently AV paced.  - Continue Amiodarone 400mg  daily.  - Will need to see if we can get device interrogated tomorrow.  - On Coumadin. INR 2.6. Discussed with Justin. Harrington Challenger who recommended holding this in anticipation for possible TAVR. No heparin for now.    Hypertension - BP currently well controlled.  - Continue current medications.   CKD Stage III - Creatinine 2.0 today which seems to be around Justin Boone's most recent baseline.  - Continue to monitor closely with diuresis.   Type 2  Diabetes Mellitus  - Management per primary team.   COPD Exacerbation - Still has significant wheezing on exam.  - Started on IV steroids, Ceftriaxone, and inhalers at outside hospital. No longer on Ceftriaxone. IV Steroid discontinued today and started on PO Prednisone.  - Management per primary team.   Tobacco Abuse - Justin Boone has a significant smoking history. [place] and has smoked since he was 69 years old. Smoking at least 1 ppd before admission.  - Discussed importance of complete cessation. - Committed to quitting at this time. Currently on Nicotine patches.   Otherwise, per primary team.     For questions or updates, please contact [place] Please consult [email or web address] for contact info under    Signed, Justin Boone  11/21/2019 10:28 AM   Justin Boone seen and examined   I agree with findings of Justin Boone above  Pt is a 69 yo who has been seen by Justin Boone and Justin Boone.  Hx of CAD, systolic CHF, afib/flutter, tachybrady syndrome (s/p CRT-P) and severe AS   He was just seen in cardiology clinic by Justin Boone  Echo:  The pt developed severe SOB and presented to [place]  in [place] (visiting son).   CT chest showed bilateral effusions  He was started on treatment for COPD exacerbations and CHF and infection.  He underwent thoracentesis and was transferred to [facility] Echo in [place]:  LVEF 50 to 55% (up from report here of 40 to 45%)   hte pt is comfortable since initial admit   He denies CP  Still with wheezing.    On exam:  Neck is full Lungs wheezeing.   Mild rales at based Cardiac exam   RRR  S1, Decreased S2   Gr III/VI systolic murmur base Abdomen is supple   No RUQ tenderness Ext are without edema   Good pulses  EKG and Tele:  Paced  Impression:  Pt developed decompensated heart failure in setting f severe AS  Complicated by significant COPD     He has diuresed but probably still has some increased volume on  exam  REcomm: -Give 1 dose IV lasix   80 mg  Follow response as well as renal function -Agree with continued steroids   Some of wheezing may reflect volume but there is prob a COPD component as well -With hx of atrial fibrillation (pt on amiodarone and coumadin) would have device interrogated to see how much afib he is having   This could have contrib to current exacerbation    Pt  is undergoing TAVR evaluation   He was due to have further CT exams.  WIll reivew with structural team Monday    Justin Boone

## 2019-11-22 ENCOUNTER — Inpatient Hospital Stay (HOSPITAL_COMMUNITY): Payer: Medicare HMO

## 2019-11-22 ENCOUNTER — Encounter (HOSPITAL_COMMUNITY): Payer: Self-pay | Admitting: Internal Medicine

## 2019-11-22 DIAGNOSIS — I2510 Atherosclerotic heart disease of native coronary artery without angina pectoris: Secondary | ICD-10-CM

## 2019-11-22 LAB — BASIC METABOLIC PANEL
Anion gap: 9 (ref 5–15)
BUN: 48 mg/dL — ABNORMAL HIGH (ref 8–23)
CO2: 28 mmol/L (ref 22–32)
Calcium: 8.9 mg/dL (ref 8.9–10.3)
Chloride: 104 mmol/L (ref 98–111)
Creatinine, Ser: 2.15 mg/dL — ABNORMAL HIGH (ref 0.61–1.24)
GFR calc Af Amer: 36 mL/min — ABNORMAL LOW (ref >60)
GFR calc non Af Amer: 31 mL/min — ABNORMAL LOW (ref >60)
Glucose, Bld: 116 mg/dL — ABNORMAL HIGH (ref 70–99)
Potassium: 4.5 mmol/L (ref 3.5–5.1)
Sodium: 141 mmol/L (ref 135–145)

## 2019-11-22 LAB — CBC
HCT: 30.2 % — ABNORMAL LOW (ref 39.0–52.0)
Hemoglobin: 9.2 g/dL — ABNORMAL LOW (ref 13.0–17.0)
MCH: 29.6 pg (ref 26.0–34.0)
MCHC: 30.5 g/dL (ref 30.0–36.0)
MCV: 97.1 fL (ref 80.0–100.0)
Platelets: 208 10*3/uL (ref 150–400)
RBC: 3.11 MIL/uL — ABNORMAL LOW (ref 4.22–5.81)
RDW: 19.3 % — ABNORMAL HIGH (ref 11.5–15.5)
WBC: 10.2 10*3/uL (ref 4.0–10.5)
nRBC: 0 % (ref 0.0–0.2)

## 2019-11-22 LAB — PROTIME-INR
INR: 2.5 — ABNORMAL HIGH (ref 0.8–1.2)
Prothrombin Time: 26.2 seconds — ABNORMAL HIGH (ref 11.4–15.2)

## 2019-11-22 NOTE — Progress Notes (Addendum)
Progress Note  Patient Name: Justin Boone Date of Encounter: 11/22/2019  [facility] Cardiologist: Posey Boyer, MD   Subjective   Patient put out 1.5L urine overnight. Says he has intermittent RUQ chest discomfort. Tentative plan for structural heart team to see patient regarding TAVR.   Inpatient Medications    Scheduled Meds: . amiodarone  400 mg Oral Daily  . atorvastatin  80 mg Oral q1800  . clopidogrel  75 mg Oral Daily  . DULoxetine  60 mg Oral Daily  . metoprolol succinate  50 mg Oral Daily  . mometasone-formoterol  2 puff Inhalation BID  . nicotine  21 mg Transdermal Daily  . predniSONE  40 mg Oral Q breakfast  . sodium chloride flush  3 mL Intravenous Q12H  . tamsulosin  0.4 mg Oral QPC breakfast   Continuous Infusions:  PRN Meds: melatonin   Vital Signs    Vitals:   11/21/19 1959 11/21/19 2336 11/22/19 0446 11/22/19 0450  BP: 117/71  124/76   Pulse: 61 68 66   Resp: 20 18 16    Temp: 97.7 F (36.5 C)  (!) 97.5 F (36.4 C)   TempSrc: Oral  Oral   SpO2: 98%  97%   Weight:    80.6 kg  Height:        Intake/Output Summary (Last 24 hours) at 11/22/2019 0739 Last data filed at 11/22/2019 0455 Gross per 24 hour  Intake 840 ml  Output 1500 ml  Net -660 ml   Last 3 Weights 11/22/2019 11/21/2019 11/13/2019  Weight (lbs) 177 lb 9.6 oz 180 lb 9.6 oz 182 lb 9.6 oz  Weight (kg) 80.559 kg 81.92 kg 82.827 kg      Telemetry    AV paced rhythm, HR 70-80 - Personally Reviewed  ECG    No new - Personally Reviewed  Physical Exam   GEN: No acute distress.   Neck: No JVD Cardiac: RRR, + murmurs, no rubs, or gallops.  Respiratory: diminished GI: Soft, nontender, non-distended  MS: No edema; No deformity. Neuro:  Nonfocal  Psych: Normal affect   Labs    High Sensitivity Troponin:  No results for input(s): TROPONINIHS in the last 720 hours.    Chemistry Recent Labs  Lab 12/06/2019 2002 11/21/19 0903  NA 140 139  K 5.2* 5.0  CL 102 102  CO2  29 26  GLUCOSE 144* 173*  BUN 37* 43*  CREATININE 2.19* 2.00*  CALCIUM 9.0 8.9  GFRNONAA 30* 34*  GFRAA 35* 39*  ANIONGAP 9 11     Hematology Recent Labs  Lab 11/25/2019 2002 11/21/19 0903  WBC 9.5 6.4  RBC 3.16* 3.03*  HGB 9.3* 9.0*  HCT 31.2* 30.0*  MCV 98.7 99.0  MCH 29.4 29.7  MCHC 29.8* 30.0  RDW 19.5* 19.3*  PLT 198 192    BNP Recent Labs  Lab 11/19/2019 2002  BNP 3,282.1*     DDimer No results for input(s): DDIMER in the last 168 hours.   Radiology    DG Orthopantogram  Result Date: 11/21/2019 CLINICAL DATA:  Aortic stenosis EXAM: ORTHOPANTOGRAM/PANORAMIC COMPARISON:  None. FINDINGS: Multiple missing teeth with only 3 remaining in the maxilla on the left. The remaining left upper molar is severely eroded and there is also a cavity and periapical erosion in the left upper canine. Periapical lucency around the lower left canine. There are chips and or cavities to bilateral lower incisors, canines, and the left first premolar. No evidence of mandibular osteomyelitis or fracture. IMPRESSION:  Diffuse caries of the remaining teeth. Electronically Signed   By: Monte Fantasia M.D.   On: 11/21/2019 10:26    Cardiac Studies    Echocardiogram 09/17/2019: Impressions: 1. Left ventricular ejection fraction, by estimation, is 40 to 45%. The  left ventricle has mildly decreased function. The left ventricle  demonstrates global hypokinesis. The left ventricular internal cavity size  was mildly dilated. Left ventricular  diastolic parameters are consistent with Grade II diastolic dysfunction  (pseudonormalization). Elevated left atrial pressure.  2. Right ventricular systolic function is mildly reduced. The right  ventricular size is mildly enlarged. There is moderately elevated  pulmonary artery systolic pressure.  3. Left atrial size was mildly dilated.  4. Right atrial size was mildly dilated.  5. The mitral valve is normal in structure. Mild mitral valve    regurgitation.  6. The aortic valve is tricuspid. Aortic valve regurgitation is mild.  Severe aortic valve stenosis.  7. The inferior vena cava is dilated in size with <50% respiratory  variability, suggesting right atrial pressure of 15 mmHg.   Patient Profile     69 y.o. male with a history of CAD s/p PCI of RCA in 07/2019, severe aortic stenosis with plan for TAVR with Dr. 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  Assessment & Plan    Acute on chronic combined CHF - Came in from outside hospital with acute hypoxic respiratory failure 2/2 to COPD and acute CHF. He underwent thoracentesis 6/8. Creatinine bumped and he was transferred to [facility] - BNP 3,282 - Echo 09/2019 with LVEF 40-45%, global hypokinesis, G2DD. Repeat echo showed LVEF 50-55% - IV lasix 80 mg x 1 dose - No ACE/ARB 2/2 to renal function - Toprol 50 mg daily - creatinine  2.19>2.00. Baseline 1.9-2.3 - Does not appear significantly volume up on exam. Breathing stable on room air. Will not give more IV lasix at this time.  He takes lasix 40 mg BID. Can consider starting this. MD to see  Severe Aortic stenosis - currently being worked up for TAVR - will check in with structural team   CAD s/p PCI to RCA in 07/2019 - troponin negative - Plavix, BB, statin - not on ASA due to coumadin - Has intermittent RUQ pain  Afib/flutter and tachybrady syndrome s/p PPM - s/p Metronic CRT-P 09/2019 - AV paced - amiodarone 400 mg daily - coumadin>>held for possible TAVR - INR 2.6  HTN - stable  For questions or updates, please contact [facility] Please consult [email or web address] for contact info under        Signed, Adayah Arocho Ninfa Meeker, PA-C  11/22/2019, 7:39  AM

## 2019-11-22 NOTE — Progress Notes (Signed)
Patient alert and oriented X4. Uneventful shift. Awaiting to be seen by Dr. Burt Knack for TAVR workup. VSS. Patient paced on telemetry, HX of Afib, on coumadin therapy, INR 2.6 11/21/19. Patient with no complaints of any pain or SOB. CPAP when asleep. Room air while awake. No acute needs voiced at this time, call bell in reach.

## 2019-11-22 NOTE — Consult Note (Signed)
DENTAL CONSULTATION  Date of Consultation:  11/22/2019 Patient Name:   Justin Boone Date of Birth:   June 07, 1951 Medical Record Number: [id number]   VITALS: BP 124/76 (BP Location: Right Arm)   Pulse 66   Temp (!) 97.5 F (36.4 C) (Oral)   Resp 16   Ht 5\' 6"  (1.676 m)   Wt 80.6 kg   SpO2 96%   BMI 28.67 kg/m   CHIEF COMPLAINT: Patient referrred by Dr. Burt Knack for a Dental Consultation.  HPI: Justin Boone is a 69 yo male recently diagnosed with severe aortic stenosis.  Patient with anticipated TAVR procedure.  Patient is now seen as part of a medically necessary preheart valve surgery dental protocol examination to rule out dental infection that may affect the patient's systemic health and anticipated heart valve surgery.  The patient currently denies any acute toothaches, swellings, or abscesses.  Patient does have a broken tooth in the upper left quadrant by patient report.  Patient last saw dentist approximately 3 years ago to have a tooth pulled.  This was with an Chief Financial Officer in [place], [place].  Patient denies having any complications from that dental extraction.  Patient does not seek regular dental care.  Patient had an upper partial denture fabricated in [place], [place] approximately 5 years ago.  Patient has not worn the partial denture for approximately 2 years since he lost the upper right canine.  The patient does not have a lower partial denture by report.  Patient does have some dental phobia by report.   PROBLEM LIST: Patient Active Problem List   Diagnosis Date Noted  . Severe aortic stenosis 09/20/2019    Priority: High  . Congestive heart failure (CHF) ([place]) 11/28/2019  . Type 2 diabetes mellitus with hyperlipidemia ([place]) 11/16/2019  . CKD (chronic kidney disease), stage III 11/17/2019  . Chronic systolic heart failure ([place]) 09/20/2019  . CAD (coronary artery disease) 09/20/2019  . S/P primary angioplasty with coronary stent 09/20/2019  . QT  prolongation 09/17/2019  . COPD with acute exacerbation ([place]) 09/17/2019  . AKI (acute kidney injury) ([place]) 09/17/2019  . Knee laceration, left, initial encounter 09/17/2019  . Symptomatic bradycardia 09/16/2019  . Paroxysmal atrial fibrillation ([facility])     PMH: Past Medical History:  Diagnosis Date  . A-fib ([place])   . Bradycardia 09/17/2019  . CHF (congestive heart failure) ([place])   . Diabetes mellitus type II, controlled ([place])   . HTN (hypertension)     PSH: Past Surgical History:  Procedure Laterality Date  . BIV PACEMAKER INSERTION CRT-P N/A 09/22/2019   Procedure: BIV PACEMAKER INSERTION CRT-P;  Surgeon: Deboraha Sprang, MD;  Location: [facility];  Service: Cardiovascular;  Laterality: N/A;  . CHOLECYSTECTOMY    . SHOULDER SURGERY      ALLERGIES: Allergies  Allergen Reactions  . Contrast Media [Iodinated Diagnostic Agents]   . Sulfa Antibiotics     MEDICATIONS: Current Facility-Administered Medications  Medication Dose Route Frequency Provider Last Rate Last Admin  . amiodarone (PACERONE) tablet 400 mg  400 mg Oral Daily Lucky Cowboy, MD   400 mg at 11/21/19 4097  . atorvastatin (LIPITOR) tablet 80 mg  80 mg Oral q1800 Lucky Cowboy, MD   80 mg at 11/21/19 1752  . clopidogrel (PLAVIX) tablet 75 mg  75 mg Oral Daily Lucky Cowboy, MD   75 mg at 11/21/19 0921  . DULoxetine (CYMBALTA) DR capsule 60 mg  60 mg Oral Daily Budd Palmer  Y, MD   60 mg at 11/21/19 0921  . melatonin tablet 3 mg  3 mg Oral QHS PRN Lucky Cowboy, MD      . metoprolol succinate (TOPROL-XL) 24 hr tablet 50 mg  50 mg Oral Daily Lucky Cowboy, MD   50 mg at 11/21/19 0921  . mometasone-formoterol (DULERA) 100-5 MCG/ACT inhaler 2 puff  2 puff Inhalation BID Lucky Cowboy, MD   2 puff at 11/22/19 0755  . nicotine (NICODERM CQ - dosed in mg/24 hours) patch 21 mg  21 mg Transdermal Daily Lucky Cowboy, MD   21 mg at 11/21/19 7867  . predniSONE (DELTASONE) tablet 40 mg  40 mg Oral Q breakfast  Gherghe, Costin M, MD      . sodium chloride flush (NS) 0.9 % injection 3 mL  3 mL Intravenous Q12H Lucky Cowboy, MD   3 mL at 11/21/19 2119  . tamsulosin (FLOMAX) capsule 0.4 mg  0.4 mg Oral QPC breakfast Lucky Cowboy, MD   0.4 mg at 11/21/19 6720    LABS: Lab Results  Component Value Date   WBC 10.2 11/22/2019   HGB 9.2 (L) 11/22/2019   HCT 30.2 (L) 11/22/2019   MCV 97.1 11/22/2019   PLT 208 11/22/2019      Component Value Date/Time   NA 139 11/21/2019 0903   K 5.0 11/21/2019 0903   CL 102 11/21/2019 0903   CO2 26 11/21/2019 0903   GLUCOSE 173 (H) 11/21/2019 0903   BUN 43 (H) 11/21/2019 0903   CREATININE 2.00 (H) 11/21/2019 0903   CALCIUM 8.9 11/21/2019 0903   GFRNONAA 34 (L) 11/21/2019 0903   GFRAA 39 (L) 11/21/2019 0903   Lab Results  Component Value Date   INR 2.5 (H) 11/22/2019   INR 2.6 (H) 11/21/2019   INR 2.5 (H) 11/23/2019   No results found for: PTT  SOCIAL HISTORY: Social History   Socioeconomic History  . Marital status: Single    Spouse name: Not on file  . Number of children: Not on file  . Years of education: Not on file  . Highest education level: Not on file  Occupational History  . Not on file  Tobacco Use  . Smoking status: Current Every Day Smoker    Packs/day: 0.50    Years: 50.00    Pack years: 25.00    Types: Cigarettes  . Smokeless tobacco: Never Used  Substance and Sexual Activity  . Alcohol use: Never  . Drug use: Never  . Sexual activity: Not on file  Other Topics Concern  . Not on file  Social History Narrative  . Not on file   Social Determinants of Health   Financial Resource Strain:   . Difficulty of Paying Living Expenses:   Food Insecurity:   . Worried About Charity fundraiser in the Last Year:   . Arboriculturist in the Last Year:   Transportation Needs:   . Film/video editor (Medical):   Marland Kitchen Lack of Transportation (Non-Medical):   Physical Activity:   . Days of Exercise per Week:   . Minutes of  Exercise per Session:   Stress:   . Feeling of Stress :   Social Connections:   . Frequency of Communication with Friends and Family:   . Frequency of Social Gatherings with Friends and Family:   . Attends Religious Services:   . Active Member of Clubs or Organizations:   . Attends Archivist  Meetings:   Marland Kitchen Marital Status:   Intimate Partner Violence:   . Fear of Current or Ex-Partner:   . Emotionally Abused:   Marland Kitchen Physically Abused:   . Sexually Abused:     FAMILY HISTORY: Family History  Problem Relation Age of Onset  . Heart disease Mother   . Heart disease Father   . Stroke Father     REVIEW OF SYSTEMS: Reviewed with the patient as per History of present illness. Psych: Patient does have some dental phobia.    DENTAL HISTORY: CHIEF COMPLAINT: Patient referrred by Dr. Burt Knack for a Dental Consultation.  HPI: Bracken Moffa is a 69 yo male recently diagnosed with severe aortic stenosis.  Patient with anticipated TAVR procedure.  Patient is now seen as part of a medically necessary preheart valve surgery dental protocol examination to rule out dental infection that may affect the patient's systemic health and anticipated heart valve surgery.  The patient currently denies any acute toothaches, swellings, or abscesses.  Patient does have a broken tooth in the upper left quadrant by patient report.  Patient last saw dentist approximately 3 years ago to have a tooth pulled.  This was with an Chief Financial Officer in [place], [place].  Patient denies having any complications from that dental extraction.  Patient does not seek regular dental care.  Patient had an upper partial denture fabricated in [place], [place] approximately 5 years ago.  Patient has not worn the partial denture for approximately 2 years since he lost the upper right canine.  The patient does not have a lower partial denture by report.  Patient does have some dental phobia by report.   DENTAL  EXAMINATION: GENERAL: The patient is a well-developed, well-nourished male in no acute distress. HEAD AND NECK: There is no palpable neck lymphadenopathy.  The patient denies acute TMJ symptoms. INTRAORAL EXAM: The patient has normal saliva.  There is no evidence of oral abscess formation.   DENTITION: There are multiple missing teeth.  There are retained roots in the area of tooth #14.There is evidence of maxillary and mandibular incisal and occlusal attrition.  Multiple diastemas are noted.  Multiple malpositioned teeth are noted. PERIODONTAL: Patient has chronic periodontitis with plaque and calculus accumulations, gingival recession, and some incipient tooth mobility.  There is moderate bone loss noted. DENTAL CARIES/SUBOPTIMAL RESTORATIONS: Dental caries are noted. ENDODONTIC: Patient currently denies acute pulpitis symptoms.  There appears to be periapical radiolucency at the apex of tooth #14. CROWN AND BRIDGE: There are no crown or bridge restorations. PROSTHODONTIC: Patient has an upper partial denture that he has not worn for over 2 years.  This was fabricated approximately 5 years ago in [place], [place].  The patient does not have a lower partial denture. OCCLUSION: Patient is a poor occlusal scheme secondary to multiple missing teeth, retained root segments, multiple diastemas, and lack of replacement of all missing teeth with dental prostheses.  RADIOGRAPHIC INTERPRETATION: An orthopantogram was taken on 11/21/2019 There are multiple missing teeth.  There are retained root segments in the area #14.  There are dental caries.  There is moderate bone loss.  There are multiple malpositioned and rotated teeth.  There is supra eruption and drifting of the unopposed teeth into the edentulous areas.  There is periapical pathology and radiolucency associated with retained roots #14.  I do NOT appreciate a periapical radiolucency associated with tooth #22 as was noted in the radiology  report.   ASSESSMENTS: 1.  Severe aortic stenosis 2.  Pre-TAVR heart valve surgery 3.  Chronic apical periodontitis 4.  Retained root segments 5.  Dental caries 6.  Maxillary and mandibular incisal and occlusal attrition 7.  Chronic periodontitis with bone loss 8.  Gingival recession 9.  Accretions 10.  Incipient tooth mobility 11.  Multiple missing teeth 12.  Supra eruption and drifting of the unopposed teeth into the edentulous areas 13.  Multiple diastemas 14.  Multiple malpositioned and rotated teeth 15.  History of ill fitting maxillary partial denture  16.  Dental phobia 17.  Risk for bleeding with invasive dental procedures due to current Plavix and Coumadin therapies   PLAN/RECOMMENDATIONS: 1. I discussed the risks, benefits, and complications of various treatment options with the patient in relationship to his medical and dental conditions, severe aortic stenosis, and anticipated heart valve surgery.  We discussed various treatment options to include no treatment, multiple extractions with alveoloplasty, pre-prosthetic surgery as indicated, periodontal therapy, dental restorations, root canal therapy, crown and bridge therapy, implant therapy, and replacement of missing teeth as indicated. The patient currently wishes to proceed with extraction of tooth numbers 11, 13, and 14 with alveoloplasty and gross debridement of remaining dentition in the operating room with general anesthesia.  This has been tentatively scheduled for Wednesday, 11/25/2019 at [facility].  This has been scheduled for 12 noon but will attempt to move up to an 8:30 am start time if time and space permits.  The patient will then proceed with heart valve surgery at the discretion of Dr. Burt Knack and Dr. Roxy Manns.  Coumadin therapy has already  been discontinued.  Plavix therapy will be continued due to recent stenting in March 2021.  Patient will then need to follow-up with a dentist of his choice for fabrication  of a complete denture after adequate healing and once medically stable from the anticipated heart valve surgery.  Patient will require antibiotic premedication prior to invasive dental procedures after the heart valve surgery per American Heart Association guidelines.   2. Discussion of findings with medical team and coordination of future medical and dental care as needed.    Lenn Cal, DDS

## 2019-11-22 NOTE — Progress Notes (Signed)
PROGRESS NOTE  Justin Boone FBP:[id number] DOB: [birth year] DOA: 11/09/2019 PCP: Verdell Carmine., MD   LOS: 2 days   Brief Narrative / Interim history: 69 year old male with history of A. fib on Coumadin, CAD, bradycardia status post Medtronic pacer, DM 2, hypertension, severe AS, COPD, hypertension, chronic kidney disease stage IIIb with creatinine 1.9-2.3 is admitted to [facility] is a hospital to hospital transfer from [place] for cardiology evaluation.  He is followed by Dr. Burt Knack as an outpatient for evaluation for TAVR.  Apparently has had extensive evaluation at [facility] but patient desired to have care done at [facility] health.  He was admitted at OSH for the past 5 days, where he was given IV diuresis and underwent a right-sided thoracentesis for symptomatic right-sided pleural effusion.  Subjective / 24h Interval events: Breathing is improved.  Has been able to walk from the bed to the bathroom, does experience slight shortness of breath and chest tightness but much better  Assessment & Plan: Principal Problem Acute on chronic diastolic CHF-he was admitted to the OSH 6 days ago.  He has been diuresed with IV Lasix, and upon increasing creatinine his IV Lasix has been held.  He also underwent right-sided therapeutic thoracentesis.  Currently he does appear on the mildly fluid overloaded.  Hold off on further diuresis  Active Problems Symptomatic severe aortic stenosis-cardiology consulted, appreciate input, to be seen by Dr. Burt Knack.  Dentistry to see in preparation for surgery  COPD exacerbation-Per report, he was on steroids due to significant wheezing at the outside hospital.  He is not wheezing currently, I will convert his Solu-Medrol to prednisone.  Continue nebulizers.  No wheezing this morning, appears stable  CAD-no chest pain, this is stable.  Has history of PCI.  Continue metoprolol, atorvastatin, Plavix, Coumadin.  No chest pain this morning  History of A.  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  Type 2 diabetes mellitus -Last A1c 5.3, not on medications  Tobacco use-a pack a day.  Counseled for cessation   Scheduled Meds:  amiodarone  400 mg Oral Daily   atorvastatin  80 mg Oral q1800   clopidogrel  75 mg Oral Daily   DULoxetine  60 mg Oral Daily   metoprolol succinate  50 mg Oral Daily   mometasone-formoterol  2 puff Inhalation BID   nicotine  21 mg Transdermal Daily   predniSONE  40 mg Oral Q breakfast   sodium chloride flush  3 mL Intravenous Q12H   tamsulosin  0.4 mg Oral QPC breakfast   Continuous Infusions: PRN Meds:.melatonin  DVT prophylaxis: Coumadin Code Status: Full code Family Communication: no family at bedside  Status is: Inpatient  Remains inpatient appropriate because:Transfer from OSH for cardiology evaluation, awaiting cardiology input.  Severely symptomatic CHF type symptoms due to critical AS   Dispo: The patient is from: Home              Anticipated d/c is to: Home              Anticipated d/c date is: > 3 days              Patient currently is not medically stable to d/c.  Consultants:  Cardiology  Dental medicine  Procedures:  2D echo:  Echocardiogram on 02/01/2352 showed LV systolic function at lower limits of normal with estimated EF of 50 to 55%, abnormal left ventricular  diastolic filling, multiple regional wall motion abnormalities, RVSP estimated to be 55 to 60 mmHg, moderate AAS, moderate to severe MR, greater than 50% respiratory change in IVC dimension though study was technically difficult  Microbiology  None   Antimicrobials: None     Objective: Vitals:   11/22/19 0446 11/22/19 0450 11/22/19 0756 11/22/19 0902  BP: 124/76   122/80  Pulse: 66   63  Resp: 16   16  Temp: (!) 97.5 F (36.4 C)   98.2 F  (36.8 C)  TempSrc: Oral   Oral  SpO2: 97%  96% 96%  Weight:  80.6 kg    Height:        Intake/Output Summary (Last 24 hours) at 11/22/2019 0933 Last data filed at 11/22/2019 0845 Gross per 24 hour  Intake 960 ml  Output 1650 ml  Net -690 ml   Filed Weights   11/13/2019 1851 11/21/19 0302 11/22/19 0450  Weight: 82.8 kg 81.9 kg 80.6 kg    Examination:  Constitutional: No distress, in bed, laying flat Eyes: No scleral icterus ENMT: mmm Neck: normal, supple Respiratory: Diminished at the bases but overall moves air well, distant breath sounds throughout, no wheezing heard Cardiovascular: Regular rate and rhythm, 3/6 SEM, 1+ lower extremity edema Abdomen: Soft, nontender, nondistended, positive bowel sounds Musculoskeletal: no clubbing / cyanosis.  Skin: No rash appreciated Neurologic: No focal deficits  Data Reviewed: I have independently reviewed following labs and imaging studies   CBC: Recent Labs  Lab 11/27/2019 2002 11/21/19 0903 11/22/19 0705  WBC 9.5 6.4 10.2  HGB 9.3* 9.0* 9.2*  HCT 31.2* 30.0* 30.2*  MCV 98.7 99.0 97.1  PLT 198 192 102   Basic Metabolic Panel: Recent Labs  Lab 11/12/2019 2002 11/21/19 0903 11/22/19 0705  NA 140 139 141  K 5.2* 5.0 4.5  CL 102 102 104  CO2 29 26 28   GLUCOSE 144* 173* 116*  BUN 37* 43* 48*  CREATININE 2.19* 2.00* 2.15*  CALCIUM 9.0 8.9 8.9  MG 2.2  --   --    Liver Function Tests: No results for input(s): AST, ALT, ALKPHOS, BILITOT, PROT, ALBUMIN in the last 168 hours. Coagulation Profile: Recent Labs  Lab 11/29/2019 2011 11/21/19 0903 11/22/19 0705  INR 2.5* 2.6* 2.5*   HbA1C: No results for input(s): HGBA1C in the last 72 hours. CBG: Recent Labs  Lab 11/14/2019 1849 11/29/2019 2153 11/21/19 0621  GLUCAP 117* 157* 148*    Recent Results (from the past 240 hour(s))  SARS Coronavirus 2 by RT PCR (hospital order, performed in [facility] hospital lab) Nasopharyngeal Nasopharyngeal Swab     Status: None    Collection Time: 11/12/2019  8:36 PM   Specimen: Nasopharyngeal Swab  Result Value Ref Range Status   SARS Coronavirus 2 NEGATIVE NEGATIVE Final    Comment: (NOTE) SARS-CoV-2 target nucleic acids are NOT DETECTED.  The SARS-CoV-2 RNA is generally detectable in upper and lower respiratory specimens during the acute phase of infection. The lowest concentration of SARS-CoV-2 viral copies this assay can detect is 250 copies / mL. A negative result does not preclude SARS-CoV-2 infection and should not be used as the sole basis for treatment or other patient management decisions.  A negative result may occur with improper specimen collection / handling, submission of specimen other than nasopharyngeal swab, presence of viral mutation(s) within the areas targeted by this assay, and inadequate number of viral copies (<250 copies / mL). A negative result must be combined with clinical observations,  patient history, and epidemiological information.  Fact Sheet for Patients:   StrictlyIdeas.no  Fact Sheet for Healthcare Providers: BankingDealers.co.za  This test is not yet approved or  cleared by the [place] FDA and has been authorized for detection and/or diagnosis of SARS-CoV-2 by FDA under an Emergency Use Authorization (EUA).  This EUA will remain in effect (meaning this test can be used) for the duration of the COVID-19 declaration under Section 564(b)(1) of the Act, 21 U.S.C. section 360bbb-3(b)(1), unless the authorization is terminated or revoked sooner.  Performed at [facility], [place] [address] [place], [place] [address]      Radiology Studies: No results found.  CT chest without contrast done on 11/15/2019 showed right greater than left pleural effusion from apex to base with compressive volume loss of right middle lobe.  He underwent thoracentesis on 11/16/2019 with 1.1 L removed, awaiting on pleural fluid studies to  calculate lights criteria.   Marzetta Board, MD, PhD [facility]  Between 7 am - 7 pm I am available, please contact me via Amion or Securechat  Between 7 pm - 7 am I am not available, please contact night coverage MD/APP via Amion

## 2019-11-22 NOTE — Progress Notes (Addendum)
Progress Note  Patient Name: Justin Boone Date of Encounter: 11/22/2019  [facility] Cardiologist: Posey Boyer, MD   Subjective   Pt known to me from recent office consultation 11/12/2019. He is undergoing TAVR workup and has completed all preoperative imaging studies with cardiac catheterization, PCI, and CTA studies all done in the [facility] system. After his office evaluation, he was hospitalized for acute on chronic respiratory failure at [facility] in [place], [place]. He underwent diuresis for treatment of CHF and thoracentesis of a large right pleural effusion. He then transferred to [facility] for further care since he is in the process of TAVR evaluation.   The patient states that after his pleural effusion was drained, he could immediately breathe better. He was being diuresed at this same time. He currently has no CP. Reports continued DOE but improved. Cough persists but also improved.   Inpatient Medications    Scheduled Meds: . amiodarone  400 mg Oral Daily  . atorvastatin  80 mg Oral q1800  . clopidogrel  75 mg Oral Daily  . DULoxetine  60 mg Oral Daily  . metoprolol succinate  50 mg Oral Daily  . mometasone-formoterol  2 puff Inhalation BID  . nicotine  21 mg Transdermal Daily  . predniSONE  40 mg Oral Q breakfast  . sodium chloride flush  3 mL Intravenous Q12H  . tamsulosin  0.4 mg Oral QPC breakfast   Continuous Infusions:  PRN Meds: melatonin   Vital Signs    Vitals:   11/22/19 0450 11/22/19 0756 11/22/19 0902 11/22/19 1152  BP:   122/80 117/78  Pulse:   63 65  Resp:   16 18  Temp:   98.2 F (36.8 C) 98.4 F (36.9 C)  TempSrc:   Oral Oral  SpO2:  96% 96% 97%  Weight: 80.6 kg     Height:        Intake/Output Summary (Last 24 hours) at 11/22/2019 1613 Last data filed at 11/22/2019 1300 Gross per 24 hour  Intake 1080 ml  Output 1350 ml  Net -270 ml   Last 3 Weights 11/22/2019 11/21/2019 12/02/2019  Weight (lbs) 177 lb 9.6 oz 180 lb 9.6 oz 182  lb 9.6 oz  Weight (kg) 80.559 kg 81.92 kg 82.827 kg      Telemetry    AV sequential pacing - Personally Reviewed   Physical Exam  Alert, oriented male in NAD GEN: No acute distress.   Neck: No JVD Cardiac: RRR, 3/6 harsh systolic murmur best heard at the LLSB/apex, diminished A2 Respiratory: bilateral rhonchi GI: Soft, nontender, non-distended  MS: No edema; No deformity. Neuro:  Nonfocal  Psych: Normal affect   Labs    High Sensitivity Troponin:  No results for input(s): TROPONINIHS in the last 720 hours.    Chemistry Recent Labs  Lab 11/18/2019 2002 11/21/19 0903 11/22/19 0705  NA 140 139 141  K 5.2* 5.0 4.5  CL 102 102 104  CO2 29 26 28   GLUCOSE 144* 173* 116*  BUN 37* 43* 48*  CREATININE 2.19* 2.00* 2.15*  CALCIUM 9.0 8.9 8.9  GFRNONAA 30* 34* 31*  GFRAA 35* 39* 36*  ANIONGAP 9 11 9      Hematology Recent Labs  Lab 11/18/2019 2002 11/21/19 0903 11/22/19 0705  WBC 9.5 6.4 10.2  RBC 3.16* 3.03* 3.11*  HGB 9.3* 9.0* 9.2*  HCT 31.2* 30.0* 30.2*  MCV 98.7 99.0 97.1  MCH 29.4 29.7 29.6  MCHC 29.8* 30.0 30.5  RDW 19.5* 19.3*  19.3*  PLT 198 192 208    BNP Recent Labs  Lab 11/12/2019 2002  BNP 3,282.1*     DDimer No results for input(s): DDIMER in the last 168 hours.   Radiology    DG Orthopantogram  Result Date: 11/21/2019 CLINICAL DATA:  Aortic stenosis EXAM: ORTHOPANTOGRAM/PANORAMIC COMPARISON:  None. FINDINGS: Multiple missing teeth with only 3 remaining in the maxilla on the left. The remaining left upper molar is severely eroded and there is also a cavity and periapical erosion in the left upper canine. Periapical lucency around the lower left canine. There are chips and or cavities to bilateral lower incisors, canines, and the left first premolar. No evidence of mandibular osteomyelitis or fracture. IMPRESSION: Diffuse caries of the remaining teeth. Electronically Signed   By: Monte Fantasia M.D.   On: 11/21/2019 10:26    Patient Profile       69 y.o. male with multiple comorbid conditions (CAD, Atrial Fibrillation, COPD, Stage 3b CKD) presents with acute on chronic respiratory failure and acute on chronic systolic CHF in the setting of severe, Stage D2 (low-flow, low-gradient) aortic stenosis  Assessment & Plan    Please see full consult note dated 11/12/2019. Pt clinically improved with CHF treatment and thoracentesis. Aortic stenosis is clearly contributing to his tenuous clinical situation. Will move forward with TAVR evaluation and request formal cardiac surgical consultation. If he is felt to be a candidate for TAVR, it might be most prudent to move forward as soon as next week since he will be off of warfarin for dental extraction scheduled 6/16. The patient is eager to move forward as soon as possible. Will discuss with Structural Heart Team at tomorrow's meeting. He remains on clopidogrel after undergoing PCI 08/14/2019.  Would otherwise continue current Rx. Discussed the importance of complete tobacco cessation today. He is now wearing a patch and motivated to quit.   Case reviewed with Dr Enrique Sack. Extensive records reviewed today.    For questions or updates, please contact [place] Please consult [email or web address] for contact info under     Signed, Sherren Mocha, MD  11/22/2019, 4:13 PM

## 2019-11-23 ENCOUNTER — Inpatient Hospital Stay (HOSPITAL_COMMUNITY): Payer: Medicare HMO

## 2019-11-23 DIAGNOSIS — Z954 Presence of other heart-valve replacement: Secondary | ICD-10-CM

## 2019-11-23 LAB — PROTIME-INR
INR: 2.3 — ABNORMAL HIGH (ref 0.8–1.2)
Prothrombin Time: 24.3 seconds — ABNORMAL HIGH (ref 11.4–15.2)

## 2019-11-23 MED ORDER — CEFAZOLIN SODIUM-DEXTROSE 2-4 GM/100ML-% IV SOLN
2.0000 g | INTRAVENOUS | Status: AC
  Administered 2019-11-24: 2 g via INTRAVENOUS
  Filled 2019-11-23: qty 100

## 2019-11-23 NOTE — Evaluation (Signed)
Physical Therapy Evaluation Patient Details Name: Justin Boone MRN: [id number] DOB: 1951/04/06 Today's Date: 11/23/2019   History of Present Illness  69 year old male with history of A. fib on Coumadin, CAD, bradycardia status post Medtronic pacer, DM 2, hypertension, severe AS, COPD, hypertension, chronic kidney disease stage IIIb with creatinine 1.9-2.3 is [name] is a hospital to hospital transfer from [place] for cardiology evaluation.  He is followed by Dr. Burt Knack as an outpatient for evaluation for TAVR.  Apparently has had extensive evaluation at [facility] but patient desired to have care done at [facility] health.  He was admitted at OSH for the past 5 days, where he was given IV diuresis and underwent a right-sided thoracentesis for symptomatic right-sided pleural effusion.  Clinical Impression  11/23/2019 PT TAVR Pre-Assessment  Clinical Impression Statement: Pt is a 69 y.o.male being assessed for pre-TAVR.  Pt reports symptoms of fatigue and mild SOB with activity.  Pt has grossly 4/5 strength, WFL ROM, and fairto good balance.  Pt ambulated 500 ft during the 6 minute walk test requiring 1 standing rest break with max HR of 97 bpm, lowest O2 sat 91%, BP 118/84 during mobility.  5 meter walk test produced an average gait speed of 1.51 ft/sec which indicates high fall risk.  RPE was 11 and dyspnea was 2 during mobility.  Pt was limited by fatigue and DOE 2/4.  Pt's frailty rating was 4 which is considered frail/vulnerable.  Pt would benefit from continued PT in the acute care setting due to the above listed deficits in balance, strength, ROM, endurance and activity tolerance.   6 Minute Walk Test:   Total Distance Walked:500 ft.    Did the pt need a rest break? yes If yes, why? Pain:no; Fatigue:yes; Dyspnea/O2 saturations: yes DOE 2/4 Comments: Pt became fatigued during 6 min walk test and took 1 standing rest break due to fatigue and SOB.    Pre-Test Post-Test   BP 137/73 118/84  HR 65 bpm 97 bpm  O2 saturations (indicated RA or L/min [place]) 91% RA 93% RA  Modified Borg Dyspnea Scale (0 none-10 maximal) 1 2  RPE (6 very light-10 very hard) 6 11  Comments: Pt fatigued at end of walk.   5 Meter Walk Test:  Trial 1 10.98 seconds  Trial 2 10.59 seconds  Trial 3 11.02 seconds  3 Trial Average/Gait Speed 10.86 seconds/1.51 ft/sec (<1.8 ft/sec indicates high fall risk)  Comments: Pt is a high fall risk as his gait speed is 1.51 ft/sec.    Clinical Frailty Scale (1 very fit - 9 terminally ill): 4  (</= 5/12 is considered frail)     Follow Up Recommendations Home health PT;Supervision - Intermittent    Equipment Recommendations   (rollator possibly), tub bench   Recommendations for Other Services       Precautions / Restrictions Precautions Precautions: Fall Restrictions Weight Bearing Restrictions: No      Mobility  Bed Mobility Overal bed mobility: Independent                Transfers Overall transfer level: Independent                  Ambulation/Gait Ambulation/Gait assistance: Modified independent (Device/Increase time) Gait Distance (Feet): 500 Feet Assistive device: Rolling walker (2 wheeled) Gait Pattern/deviations: Step-through pattern;Decreased stride length;Trunk flexed   Gait velocity interpretation: <1.31 ft/sec, indicative of household ambulator General Gait Details: Pt was able to ambulate with RW in hallways with 1  standing rest break only.   Stairs            Wheelchair Mobility    Modified Rankin (Stroke Patients Only)       Balance Overall balance assessment: Needs assistance Sitting-balance support: No upper extremity supported;Feet supported Sitting balance-Leahy Scale: Fair     Standing balance support: Bilateral upper extremity supported;During functional activity Standing balance-Leahy Scale: Poor Standing balance comment: relies on RW for support                              Pertinent Vitals/Pain Pain Assessment: No/denies pain    Home Living Family/patient expects to be discharged to:: Private residence Living Arrangements: Alone Available Help at Discharge: Family Type of Home: House Home Access: Stairs to enter Entrance Stairs-Rails: None Entrance Stairs-Number of Steps: 1 Home Layout: One level Home Equipment: Environmental consultant - 2 wheels;Shower seat;Grab bars - tub/shower;Bedside commode;Adaptive equipment;Cane - single point;Wheelchair - manual (states his wheelchair is old and RW broke when he fell)      Prior Function Level of Independence: Independent with assistive device(s);Needs assistance   Gait / Transfers Assistance Needed: reports using cane until recently he needed to use the RW and that he fell and the RW is broken now  ADL's / Homemaking Assistance Needed: B/D self        Hand Dominance   Dominant Hand: Right    Extremity/Trunk Assessment   Upper Extremity Assessment Upper Extremity Assessment: Defer to OT evaluation    Lower Extremity Assessment Lower Extremity Assessment: Generalized weakness    Cervical / Trunk Assessment Cervical / Trunk Assessment: Normal  Communication   Communication: No difficulties  Cognition Arousal/Alertness: Awake/alert Behavior During Therapy: WFL for tasks assessed/performed Overall Cognitive Status: Within Functional Limits for tasks assessed                                        General Comments      Exercises     Assessment/Plan    PT Assessment Patient needs continued PT services  PT Problem List Decreased balance;Decreased mobility;Decreased activity tolerance;Decreased knowledge of use of DME;Decreased safety awareness;Decreased knowledge of precautions;Cardiopulmonary status limiting activity       PT Treatment Interventions DME instruction;Gait training;Functional mobility training;Therapeutic activities;Therapeutic exercise;Balance  training;Patient/family education;Stair training    PT Goals (Current goals can be found in the Care Plan section)  Acute Rehab PT Goals Patient Stated Goal: to go home PT Goal Formulation: With patient Time For Goal Achievement: 12/07/19 Potential to Achieve Goals: Good    Frequency Min 3X/week   Barriers to discharge Decreased caregiver support      Co-evaluation               AM-PAC PT "6 Clicks" Mobility  Outcome Measure Help needed turning from your back to your side while in a flat bed without using bedrails?: None Help needed moving from lying on your back to sitting on the side of a flat bed without using bedrails?: None Help needed moving to and from a bed to a chair (including a wheelchair)?: None Help needed standing up from a chair using your arms (e.g., wheelchair or bedside chair)?: None Help needed to walk in hospital room?: None Help needed climbing 3-5 steps with a railing? : A Little 6 Click Score: 23    End of Session Equipment  Utilized During Treatment: Gait belt Activity Tolerance: Patient tolerated treatment well Patient left: with call bell/phone within reach;in bed Nurse Communication: Mobility status PT Visit Diagnosis: Muscle weakness (generalized) (M62.81);Other abnormalities of gait and mobility (R26.89)    Time: 5670-1410 PT Time Calculation (min) (ACUTE ONLY): 21 min   Charges:   PT Evaluation $PT Eval Moderate Complexity: 1 Mod          Deantre Bourdon W,PT Acute Rehabilitation Services Pager:  [phone]  Office:  [phone]    Denice Paradise 11/23/2019, 12:23 PM

## 2019-11-23 NOTE — Progress Notes (Signed)
Carotid duplex study completed.   See Cv Proc for preliminary results.   Justin Boone

## 2019-11-23 NOTE — Progress Notes (Signed)
PROGRESS NOTE  Justin Boone LKG:[id number] DOB: Oct 27, 1950 DOA: 11/25/2019 PCP: Verdell Carmine., MD   LOS: 3 days   Brief Narrative / Interim history: 69 year old male with history of A. 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  He is followed by Dr. Burt Knack as an outpatient for evaluation for TAVR.  Apparently has had extensive evaluation at [facility] but patient desired to have care done at [facility] health.  He was admitted at OSH for the past 5 days, where he was given IV diuresis and underwent a right-sided thoracentesis for symptomatic right-sided pleural effusion.  Subjective / 24h Interval events: He is feeling better, breathing better.  He is able to ambulate to the bathroom and back with some chest tightness and shortness of breath but overall appreciates improvement  Assessment & Plan: Principal Problem Acute on chronic diastolic CHF-he was admitted to the OSH 6 days ago.  He has been diuresed with IV Lasix, and upon increasing creatinine his IV Lasix has been held.  He also underwent right-sided therapeutic thoracentesis.  Very mild fluid overload but on room air.  Hold further diuresis  Active Problems Symptomatic severe aortic stenosis-structural heart team consulted, patient seen by Dr. Burt Knack.  Plans are in place for TAVR next Tuesday, cardiology also involve dentistry and patient is scheduled to have upper teeth extracted on Wednesday 6/16.  Thoracic surgery to see as well  COPD exacerbation-Per report, he was on steroids due to significant wheezing at the outside hospital.  He is not wheezing currently, I will convert his Solu-Medrol to prednisone.  Continue nebulizers.  No wheezing, stable  CAD-no chest pain, this is stable.  Has history  of PCI.  Continue metoprolol, atorvastatin, Plavix, Coumadin.  Chest pain-free  History of A. 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  Type 2 diabetes mellitus -Last A1c 5.3, not on medications  Tobacco use-a pack a day.  Counseled for cessation   Scheduled Meds: . amiodarone  400 mg Oral Daily  . atorvastatin  80 mg Oral q1800  . clopidogrel  75 mg Oral Daily  . DULoxetine  60 mg Oral Daily  . metoprolol succinate  50 mg Oral Daily  . mometasone-formoterol  2 puff Inhalation BID  . nicotine  21 mg Transdermal Daily  . predniSONE  40 mg Oral Q breakfast  . sodium chloride flush  3 mL Intravenous Q12H  . tamsulosin  0.4 mg Oral QPC breakfast   Continuous Infusions: PRN Meds:.melatonin  DVT prophylaxis: Coumadin Code Status: Full code Family Communication: no family at bedside  Status is: Inpatient  Remains inpatient appropriate because:Transfer from OSH for cardiology evaluation, awaiting cardiology input.  Severely symptomatic CHF type symptoms due to critical AS   Dispo: The patient is from: Home              Anticipated d/c is to: Home              Anticipated d/c date is: > 3 days              Patient currently is not medically stable to d/c.  Consultants:  Cardiology  Dental medicine Cardiothoracic surgery  Procedures:  2D echo:  Echocardiogram on 11/18/2019 showed  LV systolic function at lower limits of normal with estimated EF of 50 to 55%, abnormal left ventricular diastolic filling, multiple regional wall motion abnormalities, RVSP estimated to be 55 to 60 mmHg, moderate AAS, moderate to severe MR, greater than 50% respiratory change in IVC dimension though study was technically difficult  Microbiology  None   Antimicrobials: None     Objective: Vitals:   11/22/19 1152 11/22/19 1931  11/22/19 1949 11/23/19 0404  BP: 117/78  116/78 130/77  Pulse: 65  80 69  Resp: 18  20 19   Temp: 98.4 F (36.9 C)  98.7 F (37.1 C) 97.6 F (36.4 C)  TempSrc: Oral  Oral Oral  SpO2: 97% 94% 99% 98%  Weight:    81.7 kg  Height:        Intake/Output Summary (Last 24 hours) at 11/23/2019 0919 Last data filed at 11/23/2019 9622 Gross per 24 hour  Intake 960 ml  Output 500 ml  Net 460 ml   Filed Weights   11/21/19 0302 11/22/19 0450 11/23/19 0404  Weight: 81.9 kg 80.6 kg 81.7 kg    Examination:  Constitutional: Sitting at edge of the bed and eating breakfast, no apparent distress Eyes: No scleral icterus ENMT: Moist mucous membranes Neck: normal, supple Respiratory: Diminished at the right lung base, distant breath sounds overall, no wheezing.  No crackles. Cardiovascular: Regular rate and rhythm, 3/6 SEM, trace edema Abdomen: Soft, NT, ND, positive bowel sounds Musculoskeletal: no clubbing / cyanosis.  Skin: No rashes appreciated Neurologic: Nonfocal, equal strength  Data Reviewed: I have independently reviewed following labs and imaging studies   CBC: Recent Labs  Lab 11/22/2019 2002 11/21/19 0903 11/22/19 0705  WBC 9.5 6.4 10.2  HGB 9.3* 9.0* 9.2*  HCT 31.2* 30.0* 30.2*  MCV 98.7 99.0 97.1  PLT 198 192 297   Basic Metabolic Panel: Recent Labs  Lab 11/13/2019 2002 11/21/19 0903 11/22/19 0705  NA 140 139 141  K 5.2* 5.0 4.5  CL 102 102 104  CO2 29 26 28   GLUCOSE 144* 173* 116*  BUN 37* 43* 48*  CREATININE 2.19* 2.00* 2.15*  CALCIUM 9.0 8.9 8.9  MG 2.2  --   --    Liver Function Tests: No results for input(s): AST, ALT, ALKPHOS, BILITOT, PROT, ALBUMIN in the last 168 hours. Coagulation Profile: Recent Labs  Lab 11/12/2019 2011 11/21/19 0903 11/22/19 0705 11/23/19 0802  INR 2.5* 2.6* 2.5* 2.3*   HbA1C: No results for input(s): HGBA1C in the last 72 hours. CBG: Recent Labs  Lab 12/07/2019 1849 12/02/2019 2153 11/21/19 0621  GLUCAP 117* 157* 148*     Recent Results (from the past 240 hour(s))  SARS Coronavirus 2 by RT PCR (hospital order, performed in [facility] hospital lab) Nasopharyngeal Nasopharyngeal Swab     Status: None   Collection Time: 12/01/2019  8:36 PM   Specimen: Nasopharyngeal Swab  Result Value Ref Range Status   SARS Coronavirus 2 NEGATIVE NEGATIVE Final    Comment: (NOTE) SARS-CoV-2 target nucleic acids are NOT DETECTED.  The SARS-CoV-2 RNA is generally detectable in upper and lower respiratory specimens during the acute phase of infection. The lowest concentration of SARS-CoV-2 viral copies this assay can detect is 250 copies / mL. A negative result does not preclude SARS-CoV-2 infection and should not be used as the sole basis for treatment or other patient management decisions.  A negative result may occur with improper specimen collection / handling, submission of specimen other than nasopharyngeal swab, presence of  viral mutation(s) within the areas targeted by this assay, and inadequate number of viral copies (<250 copies / mL). A negative result must be combined with clinical observations, patient history, and epidemiological information.  Fact Sheet for Patients:   StrictlyIdeas.no  Fact Sheet for Healthcare Providers: BankingDealers.co.za  This test is not yet approved or  cleared by the [place] FDA and has been authorized for detection and/or diagnosis of SARS-CoV-2 by FDA under an Emergency Use Authorization (EUA).  This EUA will remain in effect (meaning this test can be used) for the duration of the COVID-19 declaration under Section 564(b)(1) of the Act, 21 U.S.C. section 360bbb-3(b)(1), unless the authorization is terminated or revoked sooner.  Performed at [facility], [place] [address] [place], [place] [address]      Radiology Studies: No results found.  CT chest without contrast done on 11/15/2019 showed right greater than  left pleural effusion from apex to base with compressive volume loss of right middle lobe.  He underwent thoracentesis on 11/16/2019 with 1.1 L removed, awaiting on pleural fluid studies to calculate lights criteria.   Marzetta Board, MD, PhD [facility]  Between 7 am - 7 pm I am available, please contact me via Amion or Securechat  Between 7 pm - 7 am I am not available, please contact night coverage MD/APP via Amion

## 2019-11-23 NOTE — Plan of Care (Signed)
  Problem: Clinical Measurements: Goal: Will remain free from infection Outcome: Progressing   Problem: Activity: Goal: Risk for activity intolerance will decrease Outcome: Progressing   

## 2019-11-23 NOTE — Plan of Care (Signed)
  Problem: Clinical Measurements: Goal: Will remain free from infection Outcome: Progressing   Problem: Activity: Goal: Risk for activity intolerance will decrease Outcome: Progressing   Problem: Coping: Goal: Level of anxiety will decrease Outcome: Progressing   

## 2019-11-23 NOTE — Progress Notes (Signed)
Brief cardiology progress note:  Seen ambulating the hall today. Doing well, no complaints. Seen by Dr. Burt Knack yesterday.   We will follow along, no change to plan today. Planned for dental surgery tomorrow, potential TAVR next week. Please call with questions.  Buford Dresser, MD, PhD [facility]  [address] [place], [place] [address] [phone]

## 2019-11-23 NOTE — Care Management Important Message (Signed)
Important Message  Patient Details  Name: Justin Boone MRN: [id number] Date of Birth: 09/04/1950   Medicare Important Message Given:  Yes     Shelda Altes 11/23/2019, 9:04 AM

## 2019-11-24 ENCOUNTER — Inpatient Hospital Stay (HOSPITAL_COMMUNITY): Payer: Medicare HMO | Admitting: Certified Registered"

## 2019-11-24 ENCOUNTER — Encounter (HOSPITAL_COMMUNITY): Payer: Self-pay | Admitting: Internal Medicine

## 2019-11-24 ENCOUNTER — Encounter (HOSPITAL_COMMUNITY)
Admission: AD | Disposition: E | Payer: Self-pay | Source: Other Acute Inpatient Hospital | Attending: Cardiovascular Disease

## 2019-11-24 DIAGNOSIS — I5042 Chronic combined systolic (congestive) and diastolic (congestive) heart failure: Secondary | ICD-10-CM

## 2019-11-24 DIAGNOSIS — I350 Nonrheumatic aortic (valve) stenosis: Secondary | ICD-10-CM

## 2019-11-24 DIAGNOSIS — J449 Chronic obstructive pulmonary disease, unspecified: Secondary | ICD-10-CM

## 2019-11-24 HISTORY — PX: MULTIPLE EXTRACTIONS WITH ALVEOLOPLASTY: SHX5342

## 2019-11-24 LAB — COMPREHENSIVE METABOLIC PANEL
ALT: 86 U/L — ABNORMAL HIGH (ref 0–44)
AST: 56 U/L — ABNORMAL HIGH (ref 15–41)
Albumin: 2.8 g/dL — ABNORMAL LOW (ref 3.5–5.0)
Alkaline Phosphatase: 94 U/L (ref 38–126)
Anion gap: 10 (ref 5–15)
BUN: 51 mg/dL — ABNORMAL HIGH (ref 8–23)
CO2: 27 mmol/L (ref 22–32)
Calcium: 8.8 mg/dL — ABNORMAL LOW (ref 8.9–10.3)
Chloride: 104 mmol/L (ref 98–111)
Creatinine, Ser: 1.93 mg/dL — ABNORMAL HIGH (ref 0.61–1.24)
GFR calc Af Amer: 41 mL/min — ABNORMAL LOW (ref >60)
GFR calc non Af Amer: 35 mL/min — ABNORMAL LOW (ref >60)
Glucose, Bld: 108 mg/dL — ABNORMAL HIGH (ref 70–99)
Potassium: 4.5 mmol/L (ref 3.5–5.1)
Sodium: 141 mmol/L (ref 135–145)
Total Bilirubin: 1.1 mg/dL (ref 0.3–1.2)
Total Protein: 6.1 g/dL — ABNORMAL LOW (ref 6.5–8.1)

## 2019-11-24 LAB — CBC
HCT: 31.4 % — ABNORMAL LOW (ref 39.0–52.0)
Hemoglobin: 9.5 g/dL — ABNORMAL LOW (ref 13.0–17.0)
MCH: 29.3 pg (ref 26.0–34.0)
MCHC: 30.3 g/dL (ref 30.0–36.0)
MCV: 96.9 fL (ref 80.0–100.0)
Platelets: 206 10*3/uL (ref 150–400)
RBC: 3.24 MIL/uL — ABNORMAL LOW (ref 4.22–5.81)
RDW: 19.2 % — ABNORMAL HIGH (ref 11.5–15.5)
WBC: 11.1 10*3/uL — ABNORMAL HIGH (ref 4.0–10.5)
nRBC: 0 % (ref 0.0–0.2)

## 2019-11-24 LAB — PROTIME-INR
INR: 1.9 — ABNORMAL HIGH (ref 0.8–1.2)
Prothrombin Time: 21.2 seconds — ABNORMAL HIGH (ref 11.4–15.2)

## 2019-11-24 LAB — SURGICAL PCR SCREEN
MRSA, PCR: NEGATIVE
Staphylococcus aureus: NEGATIVE

## 2019-11-24 LAB — GLUCOSE, CAPILLARY: Glucose-Capillary: 117 mg/dL — ABNORMAL HIGH (ref 70–99)

## 2019-11-24 SURGERY — MULTIPLE EXTRACTION WITH ALVEOLOPLASTY
Anesthesia: General | Site: Mouth | Laterality: Bilateral

## 2019-11-24 MED ORDER — CHLORHEXIDINE GLUCONATE 0.12 % MT SOLN
OROMUCOSAL | Status: AC
  Administered 2019-11-25: 15 mL via OROMUCOSAL
  Filled 2019-11-24: qty 15

## 2019-11-24 MED ORDER — ACETAMINOPHEN 10 MG/ML IV SOLN
INTRAVENOUS | Status: AC
  Filled 2019-11-24: qty 100

## 2019-11-24 MED ORDER — EPHEDRINE SULFATE-NACL 50-0.9 MG/10ML-% IV SOSY
PREFILLED_SYRINGE | INTRAVENOUS | Status: DC | PRN
  Administered 2019-11-24: 5 mg via INTRAVENOUS

## 2019-11-24 MED ORDER — ACETAMINOPHEN 160 MG/5ML PO SOLN: 1000.0000 mg | Freq: Once | ORAL | Status: DC | PRN

## 2019-11-24 MED ORDER — GLYCOPYRROLATE PF 0.2 MG/ML IJ SOSY
PREFILLED_SYRINGE | INTRAMUSCULAR | Status: AC
  Filled 2019-11-24: qty 1

## 2019-11-24 MED ORDER — ROCURONIUM BROMIDE 10 MG/ML (PF) SYRINGE
PREFILLED_SYRINGE | INTRAVENOUS | Status: AC
  Filled 2019-11-24: qty 10

## 2019-11-24 MED ORDER — OXYMETAZOLINE HCL 0.05 % NA SOLN
NASAL | Status: AC
  Filled 2019-11-24: qty 30

## 2019-11-24 MED ORDER — FENTANYL CITRATE (PF) 100 MCG/2ML IJ SOLN
INTRAMUSCULAR | Status: DC | PRN
  Administered 2019-11-24: 50 ug via INTRAVENOUS

## 2019-11-24 MED ORDER — AMINOCAPROIC ACID SOLUTION 5% (50 MG/ML)
10.0000 mL | ORAL | Status: AC
  Administered 2019-11-24 (×8): 10 mL via ORAL
  Filled 2019-11-24: qty 100

## 2019-11-24 MED ORDER — ROCURONIUM BROMIDE 10 MG/ML (PF) SYRINGE
PREFILLED_SYRINGE | INTRAVENOUS | Status: DC | PRN
  Administered 2019-11-24: 60 mg via INTRAVENOUS

## 2019-11-24 MED ORDER — SUGAMMADEX SODIUM 200 MG/2ML IV SOLN
INTRAVENOUS | Status: DC | PRN
  Administered 2019-11-24: 200 mg via INTRAVENOUS

## 2019-11-24 MED ORDER — LACTATED RINGERS IV SOLN: INTRAVENOUS | Status: DC

## 2019-11-24 MED ORDER — HEMOSTATIC AGENTS (NO CHARGE) OPTIME
TOPICAL | Status: DC | PRN
  Administered 2019-11-24: 1 via TOPICAL

## 2019-11-24 MED ORDER — FENTANYL CITRATE (PF) 100 MCG/2ML IJ SOLN
INTRAMUSCULAR | Status: AC
  Filled 2019-11-24: qty 2

## 2019-11-24 MED ORDER — 0.9 % SODIUM CHLORIDE (POUR BTL) OPTIME
TOPICAL | Status: DC | PRN
  Administered 2019-11-24: 1000 mL

## 2019-11-24 MED ORDER — STERILE WATER FOR IRRIGATION IR SOLN
Status: DC | PRN
  Administered 2019-11-24: 1000 mL

## 2019-11-24 MED ORDER — FENTANYL CITRATE (PF) 250 MCG/5ML IJ SOLN
INTRAMUSCULAR | Status: AC
  Filled 2019-11-24: qty 5

## 2019-11-24 MED ORDER — LIDOCAINE-EPINEPHRINE 2 %-1:100000 IJ SOLN
INTRAMUSCULAR | Status: DC | PRN
  Administered 2019-11-24: 6.8 mL via INTRADERMAL

## 2019-11-24 MED ORDER — OXYMETAZOLINE HCL 0.05 % NA SOLN
NASAL | Status: DC | PRN
  Administered 2019-11-24: 1

## 2019-11-24 MED ORDER — EPHEDRINE 5 MG/ML INJ
INTRAVENOUS | Status: AC
  Filled 2019-11-24: qty 10

## 2019-11-24 MED ORDER — LIDOCAINE-EPINEPHRINE 2 %-1:100000 IJ SOLN
INTRAMUSCULAR | Status: AC
  Filled 2019-11-24: qty 10.2

## 2019-11-24 MED ORDER — ORAL CARE MOUTH RINSE: 15.0000 mL | Freq: Once | OROMUCOSAL | Status: DC

## 2019-11-24 MED ORDER — PHENYLEPHRINE 40 MCG/ML (10ML) SYRINGE FOR IV PUSH (FOR BLOOD PRESSURE SUPPORT)
PREFILLED_SYRINGE | INTRAVENOUS | Status: DC | PRN
  Administered 2019-11-24: 40 ug via INTRAVENOUS
  Administered 2019-11-24: 80 ug via INTRAVENOUS
  Administered 2019-11-24 (×2): 40 ug via INTRAVENOUS
  Administered 2019-11-24: 80 ug via INTRAVENOUS

## 2019-11-24 MED ORDER — LACTATED RINGERS IV SOLN: INTRAVENOUS | Status: DC | PRN

## 2019-11-24 MED ORDER — ONDANSETRON HCL 4 MG/2ML IJ SOLN
INTRAMUSCULAR | Status: AC
  Filled 2019-11-24: qty 2

## 2019-11-24 MED ORDER — PHENYLEPHRINE HCL-NACL 10-0.9 MG/250ML-% IV SOLN
INTRAVENOUS | Status: DC | PRN
  Administered 2019-11-24: 60 ug/min via INTRAVENOUS

## 2019-11-24 MED ORDER — LIDOCAINE 2% (20 MG/ML) 5 ML SYRINGE
INTRAMUSCULAR | Status: DC | PRN
  Administered 2019-11-24: 80 mg via INTRAVENOUS

## 2019-11-24 MED ORDER — FENTANYL CITRATE (PF) 100 MCG/2ML IJ SOLN
25.0000 ug | INTRAMUSCULAR | Status: DC | PRN
  Administered 2019-11-24: 25 ug via INTRAVENOUS

## 2019-11-24 MED ORDER — THROMBIN 5000 UNITS EX SOLR
CUTANEOUS | Status: DC | PRN
  Administered 2019-11-24: 5000 [IU] via TOPICAL

## 2019-11-24 MED ORDER — LIDOCAINE 2% (20 MG/ML) 5 ML SYRINGE
INTRAMUSCULAR | Status: AC
  Filled 2019-11-24: qty 5

## 2019-11-24 MED ORDER — PROPOFOL 10 MG/ML IV BOLUS
INTRAVENOUS | Status: AC
  Filled 2019-11-24: qty 20

## 2019-11-24 MED ORDER — MIDAZOLAM HCL 2 MG/2ML IJ SOLN
INTRAMUSCULAR | Status: AC
  Filled 2019-11-24: qty 2

## 2019-11-24 MED ORDER — CHLORHEXIDINE GLUCONATE 0.12 % MT SOLN: 15.0000 mL | Freq: Once | OROMUCOSAL | Status: DC

## 2019-11-24 MED ORDER — ONDANSETRON HCL 4 MG/2ML IJ SOLN
INTRAMUSCULAR | Status: DC | PRN
  Administered 2019-11-24: 4 mg via INTRAVENOUS

## 2019-11-24 MED ORDER — OXYCODONE HCL 5 MG PO TABS: 5.0000 mg | ORAL_TABLET | Freq: Once | ORAL | Status: DC | PRN

## 2019-11-24 MED ORDER — ACETAMINOPHEN 10 MG/ML IV SOLN
1000.0000 mg | Freq: Once | INTRAVENOUS | Status: DC | PRN
  Administered 2019-11-24: 1000 mg via INTRAVENOUS

## 2019-11-24 MED ORDER — DEXAMETHASONE SODIUM PHOSPHATE 10 MG/ML IJ SOLN
INTRAMUSCULAR | Status: DC | PRN
  Administered 2019-11-24: 10 mg via INTRAVENOUS

## 2019-11-24 MED ORDER — HYDROCODONE-ACETAMINOPHEN 5-325 MG PO TABS
1.0000 | ORAL_TABLET | Freq: Four times a day (QID) | ORAL | Status: DC | PRN
  Administered 2019-11-24 – 2019-11-25 (×3): 1 via ORAL
  Administered 2019-11-25: 2 via ORAL
  Filled 2019-11-24 (×3): qty 1
  Filled 2019-11-24: qty 2

## 2019-11-24 MED ORDER — MIDAZOLAM HCL 5 MG/5ML IJ SOLN
INTRAMUSCULAR | Status: DC | PRN
  Administered 2019-11-24 (×2): 1 mg via INTRAVENOUS

## 2019-11-24 MED ORDER — PROPOFOL 10 MG/ML IV BOLUS
INTRAVENOUS | Status: DC | PRN
  Administered 2019-11-24: 30 mg via INTRAVENOUS
  Administered 2019-11-24: 40 mg via INTRAVENOUS
  Administered 2019-11-24: 30 mg via INTRAVENOUS

## 2019-11-24 MED ORDER — OXYCODONE HCL 5 MG/5ML PO SOLN: 5.0000 mg | Freq: Once | ORAL | Status: DC | PRN

## 2019-11-24 MED ORDER — DEXAMETHASONE SODIUM PHOSPHATE 10 MG/ML IJ SOLN
INTRAMUSCULAR | Status: AC
  Filled 2019-11-24: qty 1

## 2019-11-24 MED ORDER — ACETAMINOPHEN 500 MG PO TABS: 1000.0000 mg | ORAL_TABLET | Freq: Once | ORAL | Status: DC | PRN

## 2019-11-24 MED ORDER — BUPIVACAINE-EPINEPHRINE (PF) 0.5% -1:200000 IJ SOLN
INTRAMUSCULAR | Status: AC
  Filled 2019-11-24: qty 3.6

## 2019-11-24 MED ORDER — PHENYLEPHRINE 40 MCG/ML (10ML) SYRINGE FOR IV PUSH (FOR BLOOD PRESSURE SUPPORT)
PREFILLED_SYRINGE | INTRAVENOUS | Status: AC
  Filled 2019-11-24: qty 10

## 2019-11-24 MED ORDER — THROMBIN 5000 UNITS EX SOLR
CUTANEOUS | Status: AC
  Filled 2019-11-24: qty 5000

## 2019-11-24 SURGICAL SUPPLY — 41 items
ALCOHOL 70% 16 OZ (MISCELLANEOUS) ×3 IMPLANT
ATTRACTOMAT 16X20 MAGNETIC DRP (DRAPES) ×3 IMPLANT
BANDAGE HEMOSTAT MRDH 4X4 STRL (MISCELLANEOUS) IMPLANT
BLADE SURG 15 STRL LF DISP TIS (BLADE) ×2 IMPLANT
BLADE SURG 15 STRL SS (BLADE) ×4
BNDG HEMOSTAT MRDH 4X4 STRL (MISCELLANEOUS)
COVER SURGICAL LIGHT HANDLE (MISCELLANEOUS) ×3 IMPLANT
COVER WAND RF STERILE (DRAPES) ×3 IMPLANT
GAUZE 4X4 16PLY RFD (DISPOSABLE) ×3 IMPLANT
GAUZE PACKING FOLDED 2  STR (GAUZE/BANDAGES/DRESSINGS) ×2
GAUZE PACKING FOLDED 2 STR (GAUZE/BANDAGES/DRESSINGS) ×1 IMPLANT
GAUZE SPONGE 4X4 12PLY STRL LF (GAUZE/BANDAGES/DRESSINGS) ×3 IMPLANT
GLOVE BIO SURGEON STRL SZ 6.5 (GLOVE) ×2 IMPLANT
GLOVE BIO SURGEONS STRL SZ 6.5 (GLOVE) ×1
GLOVE SURG ORTHO 8.0 STRL STRW (GLOVE) ×3 IMPLANT
GOWN STRL REUS W/ TWL LRG LVL3 (GOWN DISPOSABLE) ×1 IMPLANT
GOWN STRL REUS W/TWL 2XL LVL3 (GOWN DISPOSABLE) ×3 IMPLANT
GOWN STRL REUS W/TWL LRG LVL3 (GOWN DISPOSABLE) ×2
HEMOSTAT SURGICEL 2X14 (HEMOSTASIS) IMPLANT
KIT BASIN OR (CUSTOM PROCEDURE TRAY) ×3 IMPLANT
KIT TURNOVER KIT B (KITS) ×3 IMPLANT
MANIFOLD NEPTUNE II (INSTRUMENTS) ×3 IMPLANT
NEEDLE BLUNT 16X1.5 OR ONLY (NEEDLE) IMPLANT
NEEDLE DENTAL 27 LONG (NEEDLE) IMPLANT
NS IRRIG 1000ML POUR BTL (IV SOLUTION) ×3 IMPLANT
PACK EENT II TURBAN DRAPE (CUSTOM PROCEDURE TRAY) ×3 IMPLANT
PAD ARMBOARD 7.5X6 YLW CONV (MISCELLANEOUS) ×3 IMPLANT
SPONGE SURGIFOAM ABS GEL 100 (HEMOSTASIS) IMPLANT
SPONGE SURGIFOAM ABS GEL 12-7 (HEMOSTASIS) IMPLANT
SPONGE SURGIFOAM ABS GEL SZ50 (HEMOSTASIS) IMPLANT
SUCTION FRAZIER HANDLE 10FR (MISCELLANEOUS) ×2
SUCTION TUBE FRAZIER 10FR DISP (MISCELLANEOUS) ×1 IMPLANT
SUT CHROMIC 3 0 PS 2 (SUTURE) ×6 IMPLANT
SUT CHROMIC 4 0 P 3 18 (SUTURE) IMPLANT
SYR 50ML SLIP (SYRINGE) ×3 IMPLANT
TOWEL GREEN STERILE (TOWEL DISPOSABLE) ×3 IMPLANT
TUBE CONNECTING 12'X1/4 (SUCTIONS) ×1
TUBE CONNECTING 12X1/4 (SUCTIONS) ×2 IMPLANT
WATER STERILE IRR 1000ML POUR (IV SOLUTION) ×3 IMPLANT
WATER TABLETS ICX (MISCELLANEOUS) ×3 IMPLANT
YANKAUER SUCT BULB TIP NO VENT (SUCTIONS) ×3 IMPLANT

## 2019-11-24 NOTE — Progress Notes (Signed)
Patient refused CPAP for the night  

## 2019-11-24 NOTE — Consult Note (Signed)
[address]       [place] [address]             [phone]          CARDIOTHORACIC SURGERY CONSULTATION REPORT  PCP is Spry, Marsh Dolly., MD Referring Provider is Justin Mocha, MD Primary Cardiologist is Justin Boyer, MD  Reason for consultation:  Severe aortic stenosis  HPI:  Patient has 69 year old male with history of chronic combined systolic and diastolic congestive heart failure, low flow low gradient severe aortic stenosis, coronary artery disease status post recent PCI and stenting of the right coronary artery, recurrent paroxysmal atrial fibrillation, bradycardia with recent syncopal event status post Medtronic CRT BiV pacemaker, type 2 diabetes mellitus, hypertension, stage III chronic kidney disease, obstructive sleep apnea, and longstanding tobacco abuse with 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  Patient states he has a long history of symptoms of shortness of breath that have progressed over several years.  For a period of time he was followed by Dr. Otho Boone in [facility] who reportedly told the patient he had a murmur.  He was hospitalized at [facility] for shortness of breath attributed to COPD exacerbation of January 2021 and ultimately transferred to [facility] for further management.  During that time he had atrial fibrillation with rapid ventricular response and he was started on amiodarone.  Echocardiogram revealed left ventricular ejection fraction 40% with severe low-flow low gradient aortic stenosis.  His hospital course was complicated by acute kidney injury complicating chronic kidney disease  stage III.  He ultimately underwent cardioversion and diagnostic cardiac catheterization.  Catheterization revealed high-grade stenosis of the right coronary artery with otherwise moderate nonobstructive coronary artery disease.  He was treated with PCI and stenting of the right coronary artery.  CTA studies were performed for possible TAVR evaluation at that time.  The patient was ultimately discharged home on oral Eliquis and Plavix.  He suffered a syncopal episode and was hospitalized at [facility] in April 2021.  He suffered a fall at the time of his syncopal event associated with laceration of his left knee.  During his hospitalization he demonstrated evidence of tachybradycardia syndrome.  He ultimately underwent permanent pacemaker implantation using a biventricular pacemaker.  He was discharged from the hospital and followed by Dr. Curt Boone.  He was referred to the multidisciplinary heart valve clinic for further management of his aortic stenosis.  He was evaluated by Dr. Burt Boone and plans for transcatheter aortic valve replacement were initiated.  Following hospital discharge the patient was staying with his son in [place] when he developed severe acute exacerbation of resting shortness of breath which prompted hospital admission at [facility] in [place].  BNP levels were markedly elevated at the time the patient was hospitalized for acute hypoxic respiratory failure felt secondary to COPD exacerbation and acute on chronic congestive heart failure.  He improved on IV Lasix, IV steroids, inhalers, and ceftriaxone.  While in [place] the patient underwent right thoracentesis for symptomatic relief following removal of approximately 1.1 L of serous pleural effusion.  Since transfer to [name] the patient has continued to improve on medical therapy.  He underwent dental  extraction earlier today by Dr. Lawana Boone.  Patient is recently widowed and lives alone in [place].  He has 2  adult sons 1 of whom lives nearby and another who lives in [place].  He has been retired for approximately 10 years.  He has remained functionally independent until his current acute illness.  He describes a long history of chronic shortness of breath that is severely limiting but has been much worse recently.  He denies any chest pain or chest tightness either with activity or at rest.  He has had some productive cough and wheezing.  He has not had fevers or chills.  He denies orthopnea.  He has not had any further dizzy spells or syncope since his syncopal event last April.  Past Medical History:  Diagnosis Date  . A-fib ([place])   . Bradycardia 09/17/2019  . CHF (congestive heart failure) ([place])   . Diabetes mellitus type II, controlled ([place])   . HTN (hypertension)     Past Surgical History:  Procedure Laterality Date  . BIV PACEMAKER INSERTION CRT-P N/A 09/22/2019   Procedure: BIV PACEMAKER INSERTION CRT-P;  Surgeon: Deboraha Sprang, MD;  Location: [place] [facility];  Service: Cardiovascular;  Laterality: N/A;  . CHOLECYSTECTOMY    . SHOULDER SURGERY      Family History  Problem Relation Age of Onset  . Heart disease Mother   . Heart disease Father   . Stroke Father     Social History   Socioeconomic History  . Marital status: Single    Spouse name: Not on file  . Number of children: Not on file  . Years of education: Not on file  . Highest education level: Not on file  Occupational History  . Not on file  Tobacco Use  . Smoking status: Current Every Day Smoker    Packs/day: 0.50    Years: 50.00    Pack years: 25.00    Types: Cigarettes  . Smokeless tobacco: Never Used  Substance and Sexual Activity  . Alcohol use: Never  . Drug use: Never  . Sexual activity: Not on file  Other Topics Concern  . Not on file  Social History Narrative  . Not on file   Social Determinants of Health   Financial Resource Strain:   . Difficulty of Paying Living  Expenses:   Food Insecurity:   . Worried About Charity fundraiser in the Last Year:   . Arboriculturist in the Last Year:   Transportation Needs:   . Film/video editor (Medical):   Marland Kitchen Lack of Transportation (Non-Medical):   Physical Activity:   . Days of Exercise per Week:   . Minutes of Exercise per Session:   Stress:   . Feeling of Stress :   Social Connections:   . Frequency of Communication with Friends and Family:   . Frequency of Social Gatherings with Friends and Family:   . Attends Religious Services:   . Active Member of Clubs or Organizations:   . Attends Archivist Meetings:   Marland Kitchen Marital Status:   Intimate Partner Violence:   . Fear of Current or Ex-Partner:   . Emotionally Abused:   Marland Kitchen Physically Abused:   . Sexually Abused:     Prior to Admission medications   Medication Sig Start Date End Date Taking? Authorizing Provider  albuterol (PROVENTIL) (2.5 MG/3ML) 0.083% nebulizer solution Inhale 3 mLs into the lungs every 6 (six) hours as needed for shortness  of breath or wheezing. 08/27/19  Yes [provider]  albuterol (VENTOLIN HFA) 108 (90 Base) MCG/ACT inhaler Inhale 2 puffs into the lungs every 6 (six) hours as needed for wheezing or shortness of breath.   Yes [provider]  allopurinol (ZYLOPRIM) 300 MG tablet Take 300 mg by mouth daily. 08/20/19  Yes [provider]  amiodarone (PACERONE) 400 MG tablet Take 1 tablet (400mg ) by mouth twice daily x 2 weeks Then decrease to 1 tablet (400mg ) daily x 4 weeks Patient taking differently: Take 400 mg by mouth daily.  10/06/19  Yes Deboraha Sprang, MD  atorvastatin (LIPITOR) 80 MG tablet Take 80 mg by mouth daily. 08/20/19  Yes [provider]  clopidogrel (PLAVIX) 75 MG tablet Take 75 mg by mouth daily. 08/12/19  Yes [provider]  DULoxetine (CYMBALTA) 60 MG capsule Take 60 mg by mouth daily. 07/30/19  Yes [provider]  furosemide (LASIX) 80 MG tablet  Take 1 tablet (80 mg total) by mouth 2 (two) times daily. 09/28/19  Yes Shelly Coss, MD  metoprolol succinate (TOPROL-XL) 50 MG 24 hr tablet Take 50 mg by mouth daily. 08/20/19  Yes [provider]  pantoprazole (PROTONIX) 40 MG tablet Take 40 mg by mouth daily.   Yes [provider]  tamsulosin (FLOMAX) 0.4 MG CAPS capsule Take 0.4 mg by mouth daily. 07/30/19  Yes [provider]  TRELEGY ELLIPTA 100-62.5-25 MCG/INH AEPB Inhale 1 puff into the lungs daily. 08/27/19  Yes [provider]  warfarin (COUMADIN) 2 MG tablet Take 2 mg by mouth See admin instructions. Tuesday, Thursday, Saturday, and Sunday evenings   Yes [provider]  warfarin (COUMADIN) 3 MG tablet Take 1 tablet (3 mg total) by mouth daily. Please cancel the earlier prescription for aspirin Patient taking differently: Take 3 mg by mouth every Monday, Wednesday, and Friday.  09/28/19 11/27/19 Yes Shelly Coss, MD    Current Facility-Administered Medications  Medication Dose Route Frequency Provider Last Rate Last Admin  . acetaminophen (OFIRMEV) 10 MG/ML IV           . aminocaproic acid (AMICAR) oral solution 50 mg/mL (5%), 100 ml  10 mL Oral Q1H Teena Dunk F, DDS   10 mL at 11/29/2019 1523  . amiodarone (PACERONE) tablet 400 mg  400 mg Oral Daily Teena Dunk F, DDS   400 mg at 11/23/19 0836  . atorvastatin (LIPITOR) tablet 80 mg  80 mg Oral q1800 Lenn Cal, DDS   80 mg at 11/23/19 1709  . chlorhexidine (PERIDEX) 0.12 % solution           . clopidogrel (PLAVIX) tablet 75 mg  75 mg Oral Daily Lenn Cal, DDS   75 mg at 11/23/19 0817  . DULoxetine (CYMBALTA) DR capsule 60 mg  60 mg Oral Daily Lenn Cal, DDS   60 mg at 11/23/19 0817  . fentaNYL (SUBLIMAZE) 100 MCG/2ML injection           . HYDROcodone-acetaminophen (NORCO/VICODIN) 5-325 MG per tablet 1-2 tablet  1-2 tablet Oral Q6H PRN Teena Dunk F, DDS      . lactated ringers infusion   Intravenous  Continuous Lenn Cal, DDS 10 mL/hr at 11/18/2019 1522 New Bag at 11/23/2019 1522  . melatonin tablet 3 mg  3 mg Oral QHS PRN Lenn Cal, DDS      . metoprolol succinate (TOPROL-XL) 24 hr tablet 50 mg  50 mg Oral Daily Lenn Cal,  DDS   50 mg at 11/12/2019 1058  . mometasone-formoterol (DULERA) 100-5 MCG/ACT inhaler 2 puff  2 puff Inhalation BID Lenn Cal, DDS   2 puff at 11/12/2019 0849  . nicotine (NICODERM CQ - dosed in mg/24 hours) patch 21 mg  21 mg Transdermal Daily Teena Dunk F, DDS   21 mg at 11/19/2019 1058  . predniSONE (DELTASONE) tablet 40 mg  40 mg Oral Q breakfast Lenn Cal, DDS   40 mg at 11/17/2019 0558  . sodium chloride flush (NS) 0.9 % injection 3 mL  3 mL Intravenous Q12H Teena Dunk F, DDS   3 mL at 12/05/2019 1438  . tamsulosin (FLOMAX) capsule 0.4 mg  0.4 mg Oral QPC breakfast Teena Dunk F, DDS   0.4 mg at 11/23/19 9147    Allergies  Allergen Reactions  . Contrast Media [Iodinated Diagnostic Agents]   . Sulfa Antibiotics       Review of Systems:  Per HPI  - remainder non-contributory     Physical Exam:   BP 131/85   Pulse (!) 59   Temp 97.9 F (36.6 C) (Axillary)   Resp 18   Ht 5\' 6"  (1.676 m)   Wt 82.5 kg   SpO2 100%   BMI 29.34 kg/m   General:  Well-nourished male who appears older than stated age in no acute distress although active bleeding from mouth  HEENT:  Unremarkable other than bleeding from mouth  Neck:   no JVD, no bruits, no adenopathy   Chest:   Scattered rhonchi without wheezes, symmetrical breath sounds, no wheezes, no rhonchi   CV:   RRR, harsh grade 3/6 crescendo decrescendo systolic murmur murmur   Abdomen:  soft, non-tender, no masses   Extremities:  warm, well-perfused, pulses diminished, no lower extremity edema  Rectal/GU  Deferred  Neuro:   Grossly non-focal and symmetrical throughout  Skin:   Clean and dry, no rashes, no breakdown  Diagnostic Tests:  Lab Results: Recent Labs      11/22/19 0705 12/07/2019 0626  WBC 10.2 11.1*  HGB 9.2* 9.5*  HCT 30.2* 31.4*  PLT 208 206   BMET:  Recent Labs    11/22/19 0705 12/07/2019 0626  NA 141 141  K 4.5 4.5  CL 104 104  CO2 28 27  GLUCOSE 116* 108*  BUN 48* 51*  CREATININE 2.15* 1.93*  CALCIUM 8.9 8.8*    CBG (last 3)  Recent Labs    12/03/2019 1127  GLUCAP 117*   PT/INR:   Recent Labs    11/09/2019 0626  LABPROT 21.2*  INR 1.9*    CXR:  N/A   ECHOCARDIOGRAM REPORT       Patient Name:  Justin Boone Date of Exam: 09/17/2019  Medical Rec #: [id number]  Height:    72.0 in  Accession #:  [id number]  Weight:    190.0 lb  Date of Birth: 03/04/1951  BSA:     2.085 m  Patient Age:  84 years   BP:      110/66 mmHg  Patient Gender: M      HR:      52 bpm.  Exam Location: Inpatient   Procedure: 2D Echo, Cardiac Doppler and Color Doppler   Indications:  Aortic Stenosis 424.1/I35.0    History:    Patient has no prior history of Echocardiogram  examinations.         COPD, Arrythmias:Atrial Fibrillation; Risk Factors:Current  Smoker.    Sonographer:  Clayton Lefort RDCS (AE)  Referring Phys: [id number] [place]    1. Left ventricular ejection fraction, by estimation, is 40 to 45%. The  left ventricle has mildly decreased function. The left ventricle  demonstrates global hypokinesis. The left ventricular internal cavity size  was mildly dilated. Left ventricular  diastolic parameters are consistent with Grade II diastolic dysfunction  (pseudonormalization). Elevated left atrial pressure.  2. Right ventricular systolic function is mildly reduced. The right  ventricular size is mildly enlarged. There is moderately elevated  pulmonary artery systolic pressure.  3. Left atrial size was mildly dilated.  4. Right atrial size was mildly dilated.  5. The mitral valve is normal in structure. Mild mitral valve   regurgitation.  6. The aortic valve is tricuspid. Aortic valve regurgitation is mild.  Severe aortic valve stenosis.  7. The inferior vena cava is dilated in size with <50% respiratory  variability, suggesting right atrial pressure of 15 mmHg.   Comparison(s): No prior Echocardiogram.   Conclusion(s)/Recommendation(s): Findings are consistent with low gradient  severe aortic stenosis, but the aortic jet remains early peaking and the  absence of left ventricular hypertrophy and the presence of left  ventricular dilation suggests that left  ventricular systolic dysfunction could be due to a primary cardiomyopathy.  Consider dobutamine echocardiography.   FINDINGS  Left Ventricle: Left ventricular ejection fraction, by estimation, is 40  to 45%. The left ventricle has mildly decreased function. The left  ventricle demonstrates global hypokinesis. The left ventricular internal  cavity size was mildly dilated. There is  no left ventricular hypertrophy. Left ventricular diastolic parameters  are consistent with Grade II diastolic dysfunction (pseudonormalization).  Elevated left atrial pressure.   Right Ventricle: The right ventricular size is mildly enlarged. No  increase in right ventricular wall thickness. Right ventricular systolic  function is mildly reduced. There is moderately elevated pulmonary artery  systolic pressure. The tricuspid  regurgitant velocity is 2.69 m/s, and with an assumed right atrial  pressure of 15 mmHg, the estimated right ventricular systolic pressure is  32.6 mmHg.   Left Atrium: Left atrial size was mildly dilated.   Right Atrium: Right atrial size was mildly dilated.   Pericardium: There is no evidence of pericardial effusion.   Mitral Valve: The mitral valve is normal in structure. There is mild  thickening of the mitral valve leaflet(s). Mild mitral valve  regurgitation.   Tricuspid Valve: The tricuspid valve is normal in structure.  Tricuspid  valve regurgitation is mild.   Aortic Valve: Gradients are lower than expected due to low cardiac  output/left ventricular dysfunction. The aortic valve is tricuspid. .  There is severe thickening and severe calcifcation of the aortic valve.  Aortic valve regurgitation is mild. Aortic  regurgitation PHT measures 1066 msec. Severe aortic stenosis is present.  There is severe thickening of the aortic valve. There is severe  calcifcation of the aortic valve. Aortic valve mean gradient measures 23.2  mmHg. Aortic valve peak gradient measures  40.3 mmHg. Aortic valve area, by VTI measures 0.94 cm.   Pulmonic Valve: The pulmonic valve was grossly normal. Pulmonic valve  regurgitation is not visualized.   Aorta: The aortic root is normal in size and structure.   Venous: The inferior vena cava is dilated in size with less than 50%  respiratory variability, suggesting right atrial pressure of 15 mmHg.   IAS/Shunts: No atrial level shunt detected by color flow Doppler.  Additional Comments: There is pleural effusion in the left lateral region.     LEFT VENTRICLE  PLAX 2D  LVIDd:     5.75 cm  LVIDs:     4.40 cm  LV PW:     1.10 cm  LV IVS:    0.94 cm  LVOT diam:   2.17 cm  LV SV:     70  LV SV Index:  34  LVOT Area:   3.70 cm    LV Volumes (MOD)  LV vol d, MOD A4C: 187.0 ml  LV vol s, MOD A4C: 84.2 ml  LV SV MOD A4C:   187.0 ml   RIGHT VENTRICLE       IVC  RV S prime:   10.10 cm/s IVC diam: 2.50 cm  TAPSE (M-mode): 1.5 cm   LEFT ATRIUM      Index    RIGHT ATRIUM      Index  LA diam:   3.90 cm 1.87 cm/m RA Area:   20.50 cm  LA Vol (A2C): 43.5 ml 20.87 ml/m RA Volume:  53.30 ml 25.57 ml/m  LA Vol (A4C): 84.2 ml 40.39 ml/m  AORTIC VALVE  AV Area (Vmax):  0.91 cm  AV Area (Vmean):  0.88 cm  AV Area (VTI):   0.94 cm  AV Vmax:      317.50 cm/s  AV Vmean:     225.500 cm/s  AV VTI:       0.745 m  AV Peak Grad:   40.3 mmHg  AV Mean Grad:   23.2 mmHg  LVOT Vmax:     77.90 cm/s  LVOT Vmean:    53.875 cm/s  LVOT VTI:     0.190 m  LVOT/AV VTI ratio: 0.25  AI PHT:      1066 msec    AORTA  Ao Root diam: 3.30 cm   TRICUSPID VALVE  TR Peak grad:  28.9 mmHg  TR Vmax:    269.00 cm/s    SHUNTS  Systemic VTI: 0.19 m  Systemic Diam: 2.17 cm   Mihai Croitoru MD  Electronically signed by Sanda Klein MD  Signature Date/Time: 09/17/2019/4:55:20 PM      CTA Abdomen/Pelvis (TAVR): Vascular access measurements: Left common iliac artery: 10 x 7 mm Left external iliac artery: 9 x 7 cm Right common iliac artery: 10 x 7 mm Right external iliac artery: 8 x 6 mm.  1. Aortic annulus diameter is 27 mm. There is dense calcification of the aortic valve with an Agatston score of 839. 2. Three-vessel coronary artery atherosclerosis. 3. Moderate biatrial dilation. 4. Please see concurrent CTA chest abdomen pelvis for more accurate evaluation of noncardiac findings  CTA Heart: VASCULAR:  Aortic annulus   -Diameter: 27 mm -Working fluoro angle (degrees)- LAO 17, Cranial 7 -Agatston valvular score (AU): 839 -Agatston valvular score (AU/cm2): 147  Right-sided SVC. Moderate biatrial dilatation. No thrombus in the left atrial appendage. Right ventricle is non-dilated. Please see contemporaneous CTA for evaluation of the pulmonary arterial system. Two right and two left pulmonary veins. Left ventricle is non-dilated. No mitral annular or LVOT calcifications.  Aortic valve is trileaflet and demonstrates mild valvular calcifications. Scattered atherosclerotic calcifications are seen within all 3 coronary arteries. The RCA arises from the right coronary cusp and the left main arises from the left coronary cusp. Right coronary stent is visualized and patent. The PDA is outside the field-of-view on the current study, but originates from the RCA when  correlated with the  CTA.   Cardiac Cath: FINDINGS  Hemodynamics and Left Heart Catheterization  Aortic pressure: 105/70 mm Hg (mean 86 mm Hg)  Coronary Angiography  Dominance: Right  Left Main: The left main coronary artery (LMCA) is a large-caliber vessel  that originates from the left coronary sinus. It bifurcates into the left  anterior descending (LAD) and left circumflex (LCx) arteries. There is no  angiographic evidence of significant disease in the LMCA.  LAD: The LAD is a large-caliber vessel that gives off 2 diagonal (D)  branches before it wraps around the apex. High D1 is a large-caliber,  branching vessel. D2 is a moderate-caliber vessel with a 90% stenosis in  the proximal segment. There is no other angiographic evidence of  significant disease in the LAD.  Left Circumflex: The LCx is a large-caliber vessel that gives off 3 obtuse  marginal (OM) branches and then continues as a small vessel in the AV  groove. OM1 is a large-caliber vessel. OM2 is a moderate-caliber vessel.  OM3 is a large-caliber vessel. There is no angiographic evidence of  significant disease in the LCx.  Right Coronary: The right coronary artery (RCA) is a large-caliber vessel  originating from the right coronary sinus. It bifurcates distally into a  large-caliber posterior descending artery (PDA) and small posterolateral  (PL) branches consistent with a right dominant system. There is a 90%  stenosis of the mRCA.   PCI: Percutaneous coronary intervention performed of the mRCA  Anticoagulants: Routine, ASA, Clopidogrel, Unfractionated Heparin Guide catheter: JR4  Guide Wire: 0.014" [place] Pre-Dilation Balloon: 2.5 mm  Stent:  Synergy 3.5x16 mm DES Post-Dilation Balloon: N/a Result:  Excellent angiographic result with TIMI 3 flow  Following the procedure, the sheath was removed, and a TR band was applied  to achieve hemostasis. The TR band was subsequently removed with staged    removal of air as per protocol. The patient tolerated the procedure well  without any complications.  IVUS An [place] IVUS catheter was advanced down the wire and used to  visualize the leftand right CIA,EIA, and CFA. There was mild plaquing  throughout the inflow vessel. No significant stenoses noted with CFA  around 7.5-8.76mm   Impression:  Patient has stage D2 symptomatic low-flow low gradient aortic stenosis with reduced left ventricular systolic function.  He has a long history of symptoms of shortness of breath that are undoubtedly multifactorial and related to a combination of chronic combined systolic and diastolic congestive heart failure as well as COPD.  He was hospitalized with acute exacerbation of chronic congestive heart failure with symptoms at rest and bilateral pleural effusions, New York Heart Association functional class IV.  He has been doing better on medical therapy following IV diuresis and therapeutic thoracentesis, currently functional class IIIb.  Patient also has history of coronary artery disease and underwent recent PCI and stenting of the right coronary artery in April 2021.  He does not currently describe symptoms of chest pain or chest tightness suspicious for angina pectoris.  Patient's history is further complicated by presence of history of recurrent paroxysmal atrial fibrillation and tachybradycardia syndrome status post permanent pacemaker placement using a biventricular pacemaker.  I have personally reviewed the patient's most recent transthoracic echocardiogram performed here at [facility] last April.  Echocardiogram revealed global left ventricular systolic dysfunction with ejection fraction estimated 40 to 45% the aortic valve was trileaflet with severe thickening, calcification, and restricted leaflet mobility involving all 3 leaflets of the aortic valve.  Peak  velocity across aortic valve measured approximately 3.2 m/s corresponding to  mean transvalvular gradient estimated 23 mmHg.  Stroke-volume index was 34 and DVI notably only 0.25 with aortic valve area calculated 0.94 cm using VTI.  I have also personally reviewed images from the diagnostic cardiac catheterization performed at [facility] which revealed high-grade stenosis of the mid right coronary artery and otherwise moderate nonobstructive coronary artery disease.  The patient underwent successful PCI and stenting of the right coronary artery at that time.    I agree the patient would benefit from aortic valve replacement.  Risks associated with conventional surgery would be somewhat elevated because of the patient's numerous comorbid medical problems with particular concerns regarding the potential for risk of hypoxic respiratory failure.  Cardiac-gated CTA of the heart reveals anatomical characteristics consistent with aortic stenosis suitable for treatment by transcatheter aortic valve replacement without any significant complicating features and CTA of the aorta and iliac vessels demonstrate what appears to be adequate pelvic vascular access to facilitate a transfemoral approach although there is significant atherosclerotic aortoiliac disease.   Plan:  The patient was counseled at length regarding treatment alternatives for management of severe symptomatic aortic stenosis. Alternative approaches such as conventional aortic valve replacement, transcatheter aortic valve replacement, and continued medical therapy without intervention were compared and contrasted at length.  The risks associated with conventional surgical aortic valve replacement were discussed in detail, as were expectations for post-operative convalescence, and why I would be reluctant to consider this patient a candidate for conventional surgery.  Issues specific to transcatheter aortic valve replacement were discussed including questions about long term valve durability, the potential for paravalvular leak,  and other technical complications related to the procedure itself.  Long-term prognosis with medical therapy was discussed. This discussion was placed in the context of the patient's own specific clinical presentation and past medical history.  All of his questions have been addressed.  The patient is interested in proceeding with surgery during this hospitalization.  Following the decision to proceed with transcatheter aortic valve replacement, a discussion has been held regarding what types of management strategies would be attempted intraoperatively in the event of life-threatening complications, including whether or not the patient would be considered a candidate for the use of cardiopulmonary bypass and/or conversion to open sternotomy for attempted surgical intervention.  The patient specifically requests that should a potentially life-threatening complication develop we would attempt emergency median sternotomy and/or other aggressive surgical procedures.  The patient has been advised of a variety of complications that might develop including but not limited to risks of death, stroke, paravalvular leak, aortic dissection or other major vascular complications, aortic annulus rupture, device embolization, cardiac rupture or perforation, mitral regurgitation, acute myocardial infarction, arrhythmia, heart block or bradycardia requiring permanent pacemaker placement, congestive heart failure, respiratory failure, renal failure, pneumonia, infection, other late complications related to structural valve deterioration or migration, or other complications that might ultimately cause a temporary or permanent loss of functional independence or other long term morbidity.  The patient provides full informed consent for the procedure as described and all questions were answered.   I spent in excess of 120 minutes during the conduct of this hospital consultation and >50% of this time involved direct face-to-face  encounter for counseling and/or coordination of the patient's care.    Valentina Gu. Roxy Manns, MD 11/25/2019 4:14 PM

## 2019-11-24 NOTE — Progress Notes (Addendum)
Dr. Ermalene Postin stated "no need to call about pacemaker, magnet would be used during surgery". Relayed message to [facility] CRNA. Verbalized understanding.

## 2019-11-24 NOTE — Plan of Care (Signed)
  Problem: Clinical Measurements: Goal: Respiratory complications will improve Outcome: Progressing   Problem: Activity: Goal: Risk for activity intolerance will decrease Outcome: Progressing   Problem: Coping: Goal: Level of anxiety will decrease Outcome: Progressing   Problem: Safety: Goal: Ability to remain free from injury will improve Outcome: Progressing   

## 2019-11-24 NOTE — Transfer of Care (Signed)
Immediate Anesthesia Transfer of Care Note  Patient: Justin Boone  Procedure(s) Performed: Extraction of tooth #'s 11,13, and 14 with alveoloplasty and gross debridement of remaining dentition (Bilateral Mouth)  Patient Location: PACU  Anesthesia Type:General  Level of Consciousness: awake and patient cooperative  Airway & Oxygen Therapy: Patient Spontanous Breathing and Patient connected to face mask oxygen  Post-op Assessment: Report given to RN and Post -op Vital signs reviewed and stable  Post vital signs: Reviewed and stable  Last Vitals:  Vitals Value Taken Time  BP 122/68 12/04/2019 1317  Temp    Pulse 63 11/14/2019 1319  Resp 18 11/29/2019 1319  SpO2 100 % 11/28/2019 1319  Vitals shown include unvalidated device data.  Last Pain:  Vitals:   11/13/2019 0750  TempSrc:   PainSc: 0-No pain         Complications: No complications documented.

## 2019-11-24 NOTE — Anesthesia Procedure Notes (Signed)
Procedure Name: Intubation Date/Time: 12/08/2019 12:24 PM Performed by: Orlie Dakin, CRNA Pre-anesthesia Checklist: Patient identified, Emergency Drugs available, Suction available and Patient being monitored Patient Re-evaluated:Patient Re-evaluated prior to induction Oxygen Delivery Method: Circle system utilized Preoxygenation: Pre-oxygenation with 100% oxygen Induction Type: IV induction Ventilation: Mask ventilation without difficulty Laryngoscope Size: Miller and 3 Grade View: Grade I Tube type: Oral Tube size: 7.5 mm Number of attempts: 1 Airway Equipment and Method: Stylet Placement Confirmation: ETT inserted through vocal cords under direct vision,  positive ETCO2 and breath sounds checked- equal and bilateral Secured at: 23 cm Tube secured with: Tape Dental Injury: Teeth and Oropharynx as per pre-operative assessment

## 2019-11-24 NOTE — Anesthesia Preprocedure Evaluation (Signed)
Anesthesia Evaluation  Patient identified by MRN, date of birth, ID band Patient awake    Reviewed: Allergy & Precautions, NPO status , Patient's Chart, lab work & pertinent test results  History of Anesthesia Complications Negative for: history of anesthetic complications  Airway Mallampati: III  TM Distance: >3 FB Neck ROM: Full    Dental  (+) Poor Dentition, Missing   Pulmonary shortness of breath, neg sleep apnea, COPD, Current Smoker and Patient abstained from smoking.,  Admitted with sob and pleural effusion, sob improved s/p drainage of effusion    + decreased breath sounds      Cardiovascular hypertension, + CAD and +CHF  + dysrhythmias Atrial Fibrillation + pacemaker + Valvular Problems/Murmurs AS  Rhythm:Regular + Systolic murmurs 1. Left ventricular ejection fraction, by estimation, is 40 to 45%. The  left ventricle has mildly decreased function. The left ventricle  demonstrates global hypokinesis. The left ventricular internal cavity size  was mildly dilated. Left ventricular  diastolic parameters are consistent with Grade II diastolic dysfunction  (pseudonormalization). Elevated left atrial pressure.  2. Right ventricular systolic function is mildly reduced. The right  ventricular size is mildly enlarged. There is moderately elevated  pulmonary artery systolic pressure.  3. Left atrial size was mildly dilated.  4. Right atrial size was mildly dilated.  5. The mitral valve is normal in structure. Mild mitral valve  regurgitation.  6. The aortic valve is tricuspid. Aortic valve regurgitation is mild.  Severe aortic valve stenosis.  7. The inferior vena cava is dilated in size with <50% respiratory  variability, suggesting right atrial pressure of 15 mmHg.   Neuro/Psych negative neurological ROS  negative psych ROS   GI/Hepatic negative GI ROS, Neg liver ROS,   Endo/Other  diabetes  Renal/GU CRFRenal  disease     Musculoskeletal negative musculoskeletal ROS (+)   Abdominal   Peds  Hematology  (+) Blood dyscrasia, anemia , plavix and coumadin   Anesthesia Other Findings   Reproductive/Obstetrics                             Anesthesia Physical Anesthesia Plan  ASA: IV  Anesthesia Plan: General   Post-op Pain Management:    Induction: Intravenous  PONV Risk Score and Plan: 1 and Ondansetron and Dexamethasone  Airway Management Planned: Oral ETT  Additional Equipment: None  Intra-op Plan:   Post-operative Plan: Extubation in OR  Informed Consent: I have reviewed the patients History and Physical, chart, labs and discussed the procedure including the risks, benefits and alternatives for the proposed anesthesia with the patient or authorized representative who has indicated his/her understanding and acceptance.     Dental advisory given  Plan Discussed with: CRNA and Surgeon  Anesthesia Plan Comments:         Anesthesia Quick Evaluation

## 2019-11-24 NOTE — Progress Notes (Signed)
@  1420- Received pt. From OR post multiple tooth extraction w/ gauze packing in his mouth, moderate to large amount of blood oozing from his mouth - Pt. Alert,oriented x4 on O2 2L sating 99%. -Transferred pt back to bed - VS checked - Amicar oral solution given as instructed

## 2019-11-24 NOTE — Progress Notes (Signed)
PROGRESS NOTE  Justin Boone  DOB: [birth year]  PCP: Verdell Carmine., MD YKD:[id number]  DOA: 12/02/2019  LOS: 4 days   Brief narrative: Patient is a 70 year old male with history of A. fib on Coumadin, CAD, bradycardia sp Medtronic pacer, DM 2, hypertension, severe AS, COPD, hypertension, chronic kidney disease stage IIIb with creatinine 1.9-2.3. Patient was hospitalized (6/7-6/12) at an outside hospital ([facility]) for decompensated heart failure and COPD exacerbation.  He was treated with IV diuretics, IV Rocephin and steroids.  His renal function worsened from creatinine of 1.7 on 6/7 to 2.1 on 6/10.  6/7- CT chest showed right greater than left pleural effusion from apex to base with compressive volume loss of right middle lobe.   6/8 - He underwent thoracentesis with 1.1 L removed. 6/10 - Echocardiogram showed EF of 50 to 55%, abnormal left ventricular diastolic filling, multiple regional wall motion abnormalities, RVSP estimated to be 55 to 60 mmHg, moderate AAS, moderate to severe MR 6/12-patient was transferred from outside hospital to [facility] primarily for cardiology and nephrology evaluation.  Subjective: Patient was seen and examined this morning.  Pleasant elderly Caucasian male.  Lying down in bed.  Not in distress.  Was waiting for dental procedure today.  Assessment/Plan: Acute on chronic diastolic CHF Essential hypertension -Diuresis with IV Lasix in outside hospital. -With rising creatinine, Lasix is currently on hold. -Continue to monitor blood pressure, renal function and electrolytes.  Symptomatic severe aortic stenosis -structural heart team consulted, patient seen by Dr. Burt Knack.  -Plans are in place for TAVR next Tuesday, cardiology also involve dentistry  -As preop work-up, patient underwent upper teeth extraction today 6/16.     COPD exacerbation -managed at outside hospital with oral steroids, nebulizer.   -Continue oral steroid taper off.     -Continue to monitor breathing status.   CAD s/p PCI -no chest pain. -Continue metoprolol, atorvastatin, Plavix.  History of A. fib/flutter, PPM in place -continue amiodarone.  Coumadin is currently on hold.   Chronic kidney disease stage IIIb- -Baseline creatinine 1.9-2.3, currently at baseline.   Type 2 diabetes mellitus -Last A1c 5.3, not on medications  BPH -Flomax.   Tobacco use -Smokes a pack a day.  Counseled for cessation   Mobility: Encourage ambulation Code Status:  Full code  DVT prophylaxis: SCDs Start: 12/05/2019 1433   Antimicrobials: : Received perioperative dose of IV Ancef today Fluid: None Diet: Clear liquid diet  Consultants: Cardiology, dentistry Family Communication: Called and updated patient's son this afternoon.  Status is: Inpatient  Remains inpatient appropriate because:Ongoing diagnostic testing needed not appropriate for outpatient work up and IV treatments appropriate due to intensity of illness or inability to take PO   Dispo: The patient is from: Home              Anticipated d/c is to: Home              Anticipated d/c date is: > 3 days              Patient currently is not medically stable to d/c.   Antimicrobials: Anti-infectives (From admission, onward)    Start     Dose/Rate Route Frequency Ordered Stop   12/01/2019 1145  ceFAZolin (ANCEF) IVPB 2g/100 mL premix        2 g 200 mL/hr over 30 Minutes Intravenous To Short Stay 11/23/19 0940 11/22/2019 1245         Code Status: Full Code   Diet Order  Diet clear liquid Room service appropriate? No; Fluid consistency: Thin  Diet effective now                   Infusions:   acetaminophen     lactated ringers 10 mL/hr at 11/25/2019 1522    Scheduled Meds:  aminocaproic acid  10 mL Oral Q1H   amiodarone  400 mg Oral Daily   atorvastatin  80 mg Oral q1800   chlorhexidine       clopidogrel  75 mg Oral Daily   DULoxetine  60 mg Oral Daily   fentaNYL        metoprolol succinate  50 mg Oral Daily   mometasone-formoterol  2 puff Inhalation BID   nicotine  21 mg Transdermal Daily   predniSONE  40 mg Oral Q breakfast   sodium chloride flush  3 mL Intravenous Q12H   tamsulosin  0.4 mg Oral QPC breakfast    PRN meds: HYDROcodone-acetaminophen, melatonin   Objective: Vitals:   11/28/2019 1530 12/04/2019 1600  BP: 132/75 131/85  Pulse: (!) 59 (!) 59  Resp: 18 18  Temp:    SpO2: 100%     Intake/Output Summary (Last 24 hours) at 11/22/2019 1652 Last data filed at 11/18/2019 1319 Gross per 24 hour  Intake 640 ml  Output 900 ml  Net -260 ml   Filed Weights   11/23/19 0404 11/21/2019 0359 11/15/2019 1133  Weight: 81.7 kg 82.5 kg 82.5 kg   Weight change: 0.726 kg Body mass index is 29.34 kg/m.   Physical Exam: General exam: Appears calm and comfortable.  Skin: No rashes, lesions or ulcers. HEENT: Atraumatic, normocephalic, supple neck, no obvious bleeding Lungs: Clear to auscultation bilaterally CVS: Regular rate and rhythm, ejection systolic murmur GI/Abd soft, nontender, nondistended, bowel sound present CNS: Alert, awake, confused, slow to respond Psychiatry: Mood appropriate Extremities: no calf tenderness  Data Review: I have personally reviewed the laboratory data and studies available.  Recent Labs  Lab 11/18/2019 2002 11/21/19 0903 11/22/19 0705 12/02/2019 0626  WBC 9.5 6.4 10.2 11.1*  HGB 9.3* 9.0* 9.2* 9.5*  HCT 31.2* 30.0* 30.2* 31.4*  MCV 98.7 99.0 97.1 96.9  PLT 198 192 208 206   Recent Labs  Lab 11/10/2019 2002 11/21/19 0903 11/22/19 0705 11/14/2019 0626  NA 140 139 141 141  K 5.2* 5.0 4.5 4.5  CL 102 102 104 104  CO2 29 26 28 27   GLUCOSE 144* 173* 116* 108*  BUN 37* 43* 48* 51*  CREATININE 2.19* 2.00* 2.15* 1.93*  CALCIUM 9.0 8.9 8.9 8.8*  MG 2.2  --   --   --    Signed, Terrilee Croak, MD [facility] Pager: [phone] (Secure Chat preferred). 11/13/2019

## 2019-11-24 NOTE — Op Note (Signed)
OPERATIVE REPORT  Patient:            Justin Boone Date of Birth:  [birth year] MRN:                [id number]   DATE OF PROCEDURE:  12/04/2019  PREOPERATIVE DIAGNOSES: 1.  Severe aortic stenosis 2.  Preheart valve surgery dental protocol 3.  Chronic apical periodontitis 4.  Retained root segments 5.  Chronic periodontitis 6.  Tooth mobility 7.  Dental caries  POSTOPERATIVE DIAGNOSES: 1.  Severe aortic stenosis 2.  Preheart valve surgery dental protocol 3.  Chronic apical periodontitis 4.  Retained root segments 5.  Chronic periodontitis 6.  Tooth mobility 7.  Dental caries  OPERATIONS: 1. Multiple extraction of tooth numbers 11, 13, and 14 2.  One quadrant of alveoloplasty 3. Gross debridement of remaining dentition   SURGEON: Lenn Cal, DDS  ASSISTANT: Molli Posey (dental assistant)  ANESTHESIA: General anesthesia via oral endotracheal tube.  MEDICATIONS: 1. Ancef 2 g IV prior to invasive dental procedures. 2. Local anesthesia with a total utilization of 3 carpules each containing 34 mg of lidocaine with 0.017 mg of epinephrine   SPECIMENS: There are 3 teeth that were discarded.  DRAINS: None  CULTURES: None  COMPLICATIONS: None  ESTIMATED BLOOD LOSS: 50 mLs.  INTRAVENOUS FLUIDS: 300 mLs of Lactated ringers solution.  INDICATIONS: The patient was recently diagnosed with severe aortic stenosis.  A medically necessary dental consultation was then requested to evaluate poor dentition.  The patient was examined and treatment planned for multiple extractions with alveoloplasty and gross debridement of remaining dentition in the operating room with general anesthesia.  This treatment plan was formulated to decrease the risks and complications associated with dental infection from affecting the patient's systemic health and the anticipated heart valve surgery.  OPERATIVE FINDINGS: Patient was examined operating room number 8.  The teeth were  identified for extraction. The patient was noted be affected by chronic apical periodontitis, dental caries, retained root segments, chronic periodontitis, accretions, and loose teeth.  DESCRIPTION OF PROCEDURE: Patient was brought to the main operating room number 8. Patient was then placed in the supine position on the operating table. General anesthesia was then induced per the anesthesia team. The patient was then prepped and draped in the usual manner for dental medicine procedure. A timeout was performed. The patient was identified and procedures were verified. A throat pack was placed at this time. The oral cavity was then thoroughly examined with the findings noted above. The patient was then ready for dental medicine procedure as follows:  Local anesthesia was then administered sequentially with a total utilization of 3 carpules each containing 34 mg of lidocaine with 0.017 mg of epinephrine.  The Maxillary left quadrant was first approached. Anesthesia was then delivered utilizing infiltration with lidocaine with epinephrine. A #15 blade incision was then made from the maxillary left tuberosity and extended to the mesial of #9.  A  surgical flap was then carefully reflected. The teeth were then subluxated with a series of straight elevators. Tooth numbers 11, 13, and retained roots #14 were then removed with a 150 forceps without complications. Alveoloplasty was then performed utilizing a ronguers and bone file help achieve primary closure. The surgical site was then irrigated with copious amounts of sterile saline. The tissues were approximated and trimmed appropriately.  A piece of Surgifoam impregnated with topical thrombin was then placed in the extraction sites appropriately to aid hemostasis.  The surgical site was then  closed from the maxillary left tuberosity and extended the mesial #9 utilizing 3-0 Chromic Gut suture in a continuous erupted suture technique x1.  At this point in time, the  remaining dentition was approached.  A sonic scaler was used to remove accretions.  A series of hand curettes were used to further remove accretions.  The sonic scaler was then again used to refine the removal of accretions.  This completed the gross debridement procedure.  At this point in time, the entire mouth was irrigated with copious amounts of sterile saline. The patient was examined for complications, seeing none, the dental medicine procedure was deemed to be complete. The throat pack was removed at this time. An oral airway was then placed at the request of the anesthesia team. A series of 4 x 4 gauze impregnated with Amicar 5% were placed in the mouth to aid hemostasis. The patient was then handed over to the anesthesia team for final disposition. After an appropriate amount of time, the patient was extubated and taken to the postanesthsia care unit in good condition. All counts were correct for the dental medicine procedure.  The patient is to continue the Amicar 5% rinses postoperatively.  Patient is to rinse with 10 MLS every hour for the next 10 hours and a swish and spit manner.  Patient is to continue the Plavix therapy.  The Coumadin therapy has been discontinued until he has his heart valve surgery early next week per Dr. Burt Knack in cardiology.    Lenn Cal, DDS.

## 2019-11-24 NOTE — Progress Notes (Signed)
PRE-OPERATIVE NOTE:  11/28/2019 Justin Boone [id number]  VITALS: BP 137/77 (BP Location: Right Arm)   Pulse 62   Temp 98.2 F (36.8 C) (Oral)   Resp 20   Ht 5\' 6"  (1.676 m)   Wt 82.5 kg   SpO2 96%   BMI 29.34 kg/m   Lab Results  Component Value Date   WBC 11.1 (H) 11/29/2019   HGB 9.5 (L) 11/26/2019   HCT 31.4 (L) 11/18/2019   MCV 96.9 12/03/2019   PLT 206 12/07/2019   BMET    Component Value Date/Time   NA 141 12/03/2019 0626   K 4.5 12/05/2019 0626   CL 104 11/22/2019 0626   CO2 27 11/27/2019 0626   GLUCOSE 108 (H) 12/04/2019 0626   BUN 51 (H) 12/02/2019 0626   CREATININE 1.93 (H) 11/28/2019 0626   CALCIUM 8.8 (L) 11/20/2019 0626   GFRNONAA 35 (L) 11/18/2019 0626   GFRAA 41 (L) 11/25/2019 0626    Lab Results  Component Value Date   INR 1.9 (H) 11/11/2019   INR 2.3 (H) 11/23/2019   INR 2.5 (H) 11/22/2019   No results found for: PTT   Justin Boone presents for multiple dental extractions with alveoloplasty in the operating room with general anesthesia.   SUBJECTIVE: The patient denies any acute medical or dental changes and agrees to proceed with treatment as planned.  EXAM: No sign of acute dental changes.  ASSESSMENT: Patient is affected by chronic apical periodontitis, dental caries, retained root segments, chronic periodontitis, accretions, dental phobia, and loose teeth.  PLAN: Patient agrees to proceed with treatment as planned in the operating room as previously discussed and accepts the risks, benefits, and complications of the proposed treatment. Patient is aware of the risk for bleeding, bruising, swelling, infection, pain, nerve damage, soft tissue damage, damage to adjacent teeth, sinus involvement, root tip fracture, mandible fracture, and the risks of complications associated with the anesthesia. Patient also is aware of the potential for other complications up to and including death due to his overall cardiovascular compromise.     Lenn Cal, DDS

## 2019-11-25 ENCOUNTER — Encounter (HOSPITAL_COMMUNITY): Payer: Self-pay | Admitting: Dentistry

## 2019-11-25 ENCOUNTER — Inpatient Hospital Stay (HOSPITAL_COMMUNITY): Payer: Medicare HMO

## 2019-11-25 LAB — COMPREHENSIVE METABOLIC PANEL
ALT: 64 U/L — ABNORMAL HIGH (ref 0–44)
AST: 35 U/L (ref 15–41)
Albumin: 2.5 g/dL — ABNORMAL LOW (ref 3.5–5.0)
Alkaline Phosphatase: 73 U/L (ref 38–126)
Anion gap: 9 (ref 5–15)
BUN: 57 mg/dL — ABNORMAL HIGH (ref 8–23)
CO2: 26 mmol/L (ref 22–32)
Calcium: 8.5 mg/dL — ABNORMAL LOW (ref 8.9–10.3)
Chloride: 104 mmol/L (ref 98–111)
Creatinine, Ser: 1.92 mg/dL — ABNORMAL HIGH (ref 0.61–1.24)
GFR calc Af Amer: 41 mL/min — ABNORMAL LOW (ref >60)
GFR calc non Af Amer: 35 mL/min — ABNORMAL LOW (ref >60)
Glucose, Bld: 139 mg/dL — ABNORMAL HIGH (ref 70–99)
Potassium: 4.9 mmol/L (ref 3.5–5.1)
Sodium: 139 mmol/L (ref 135–145)
Total Bilirubin: 0.8 mg/dL (ref 0.3–1.2)
Total Protein: 5.3 g/dL — ABNORMAL LOW (ref 6.5–8.1)

## 2019-11-25 MED ORDER — ADULT MULTIVITAMIN W/MINERALS CH
1.0000 | ORAL_TABLET | Freq: Every day | ORAL | Status: DC
  Administered 2019-11-25 – 2019-12-13 (×18): 1 via ORAL
  Filled 2019-11-25 (×19): qty 1

## 2019-11-25 MED ORDER — HYDROCODONE-ACETAMINOPHEN 5-325 MG PO TABS
1.0000 | ORAL_TABLET | ORAL | Status: DC | PRN
  Administered 2019-11-25 – 2019-11-29 (×16): 1 via ORAL
  Filled 2019-11-25 (×16): qty 1

## 2019-11-25 MED ORDER — PREDNISONE 20 MG PO TABS
20.0000 mg | ORAL_TABLET | Freq: Every day | ORAL | Status: DC
  Administered 2019-11-26: 20 mg via ORAL
  Filled 2019-11-25: qty 1

## 2019-11-25 MED ORDER — SODIUM CHLORIDE 0.9 % IR SOLN
200.0000 mL | Status: DC
  Administered 2019-11-25: 200 mL

## 2019-11-25 MED ORDER — CHLORHEXIDINE GLUCONATE 0.12 % MT SOLN
15.0000 mL | Freq: Once | OROMUCOSAL | Status: AC
  Filled 2019-11-25: qty 15

## 2019-11-25 MED ORDER — LACTATED RINGERS IV SOLN: INTRAVENOUS | Status: DC

## 2019-11-25 NOTE — Plan of Care (Signed)

## 2019-11-25 NOTE — Progress Notes (Signed)
PROGRESS NOTE  Justin Boone  DOB: Nov 08, 1950  PCP: Verdell Carmine., MD UJW:[id number]  DOA: 11/09/2019  LOS: 5 days   Brief narrative: Patient is a 69 year old male with history of A. fib on Coumadin, CAD, bradycardia sp Medtronic pacer, DM 2, hypertension, severe AS, COPD, hypertension, chronic kidney disease stage IIIb with creatinine 1.9-2.3. Patient was hospitalized (6/7-6/12) at an outside hospital ([facility]) for decompensated heart failure and COPD exacerbation.  He was treated with IV diuretics, IV Rocephin and steroids.  His renal function worsened from creatinine of 1.7 on 6/7 to 2.1 on 6/10.  6/7- CT chest showed right greater than left pleural effusion from apex to base with compressive volume loss of right middle lobe.   6/8 - He underwent thoracentesis with 1.1 L removed. 6/10 - Echocardiogram showed EF of 50 to 55%, abnormal left ventricular diastolic filling, multiple regional wall motion abnormalities, RVSP estimated to be 55 to 60 mmHg, moderate AAS, moderate to severe MR 6/12-patient was transferred from outside hospital to [facility] primarily for cardiology and nephrology evaluation.  Subjective: Patient was seen and examined this morning.  Pleasant elderly Caucasian male.  Lying down in bed.  Not in distress. Underwent dental procedure yesterday.  Not complaining of pain.  Assessment/Plan: Acute on chronic diastolic CHF Essential hypertension -Diuresis done with IV Lasix in outside hospital. -With rising creatinine, Lasix is currently on hold.  Creatinine is stable at baseline at this time. -Continue to monitor blood pressure, renal function and electrolytes.  Symptomatic severe aortic stenosis -structural heart team consulted, patient seen by Dr. Burt Knack.  -Plans are in place for TAVR next Tuesday, cardiology also involve dentistry  -As preop work-up, patient underwent upper teeth extraction on 6/16.     COPD exacerbation -managed at outside  hospital with oral steroids, nebulizer.   -Continue oral steroid taper off.  I reduced the prednisone dose from 40 mg daily to 20 mg daily today. -Continue to monitor breathing status.   CAD s/p PCI -no chest pain. -Continue metoprolol, atorvastatin, Plavix.  History of A. fib/flutter, PPM in place -continue amiodarone.  Coumadin is currently on hold. -May need to be started on heparin drip.  Defer to cardiology.   Chronic kidney disease stage IIIb- -Baseline creatinine 1.9-2.3, currently at baseline.   Type 2 diabetes mellitus -Last A1c 5.3, not on medications  BPH -Flomax.   Tobacco use -Smokes a pack a day.  Counseled for cessation   Mobility: Encourage ambulation Code Status:  Full code  DVT prophylaxis: SCDs Start: 11/18/2019 1433  Antimicrobials: : Received perioperative dose of IV Ancef yesterday Fluid: None Diet: Cardiac/diabetic diet  Consultants: Cardiology, dentistry Family Communication: Called and updated patient's son yesterday.  Status is: Inpatient  Remains inpatient appropriate because:Ongoing diagnostic testing needed not appropriate for outpatient work up and IV treatments appropriate due to intensity of illness or inability to take PO   Dispo: The patient is from: Home              Anticipated d/c is to: Home              Anticipated d/c date is: > 3 days              Patient currently is not medically stable to d/c.   Antimicrobials: Anti-infectives (From admission, onward)   Start     Dose/Rate Route Frequency Ordered Stop   11/21/2019 1145  ceFAZolin (ANCEF) IVPB 2g/100 mL premix        2  g 200 mL/hr over 30 Minutes Intravenous To Short Stay 11/23/19 0940 11/27/2019 1245        Code Status: Full Code   Diet Order            Diet Heart Room service appropriate? Yes; Fluid consistency: Thin  Diet effective now                 Infusions:  . lactated ringers    . lactated ringers 10 mL/hr at 11/19/2019 1522  . sodium chloride irrigation       Scheduled Meds: . amiodarone  400 mg Oral Daily  . atorvastatin  80 mg Oral q1800  . chlorhexidine  15 mL Mouth/Throat Once  . clopidogrel  75 mg Oral Daily  . DULoxetine  60 mg Oral Daily  . metoprolol succinate  50 mg Oral Daily  . mometasone-formoterol  2 puff Inhalation BID  . nicotine  21 mg Transdermal Daily  . [START ON 11/26/2019] predniSONE  20 mg Oral Q breakfast  . sodium chloride flush  3 mL Intravenous Q12H  . tamsulosin  0.4 mg Oral QPC breakfast    PRN meds: HYDROcodone-acetaminophen, melatonin   Objective: Vitals:   11/25/19 0724 11/25/19 0808  BP:  117/72  Pulse:  67  Resp:  18  Temp:  98.2 F (36.8 C)  SpO2: 97% (!) 89%    Intake/Output Summary (Last 24 hours) at 11/25/2019 1111 Last data filed at 11/25/2019 0431 Gross per 24 hour  Intake 729.97 ml  Output 975 ml  Net -245.03 ml   Filed Weights   12/02/2019 0359 11/10/2019 1133 11/25/19 0054  Weight: 82.5 kg 82.5 kg 82 kg   Weight change: 0 kg Body mass index is 29.17 kg/m.   Physical Exam: General exam: Appears calm and comfortable.  Skin: No rashes, lesions or ulcers. HEENT: Atraumatic, normocephalic, supple neck, no obvious bleeding Lungs: Clear to auscultation bilaterally CVS: Regular rate and rhythm, ejection systolic murmur present GI/Abd soft, nontender, nondistended, bowel sound present CNS: Alert, awake, confused, slow to respond Psychiatry: Mood appropriate Extremities: no calf tenderness  Data Review: I have personally reviewed the laboratory data and studies available.  Recent Labs  Lab 11/11/2019 2002 11/21/19 0903 11/22/19 0705 11/27/2019 0626  WBC 9.5 6.4 10.2 11.1*  HGB 9.3* 9.0* 9.2* 9.5*  HCT 31.2* 30.0* 30.2* 31.4*  MCV 98.7 99.0 97.1 96.9  PLT 198 192 208 206   Recent Labs  Lab 12/01/2019 2002 11/21/19 0903 11/22/19 0705 11/12/2019 0626 11/25/19 0320  NA 140 139 141 141 139  K 5.2* 5.0 4.5 4.5 4.9  CL 102 102 104 104 104  CO2 29 26 28 27 26   GLUCOSE 144*  173* 116* 108* 139*  BUN 37* 43* 48* 51* 57*  CREATININE 2.19* 2.00* 2.15* 1.93* 1.92*  CALCIUM 9.0 8.9 8.9 8.8* 8.5*  MG 2.2  --   --   --   --    Signed, Terrilee Croak, MD [facility] Pager: [phone] (Secure Chat preferred). 11/25/2019

## 2019-11-25 NOTE — Care Management Important Message (Signed)
Important Message  Patient Details  Name: Justin Boone MRN: [id number] Date of Birth: 09-17-1950   Medicare Important Message Given:  Yes     Shelda Altes 11/25/2019, 8:41 AM

## 2019-11-25 NOTE — Progress Notes (Signed)
Initial Nutrition Assessment  DOCUMENTATION CODES:   Not applicable  INTERVENTION:   -Magic cup TID with meals, each supplement provides 290 kcal and 9 grams of protein -MVI with minerals daily  NUTRITION DIAGNOSIS:   Increased nutrient needs related to post-op healing as evidenced by estimated needs.  GOAL:   Patient will meet greater than or equal to 90% of their needs  MONITOR:   PO intake, Supplement acceptance, Diet advancement, Labs, Weight trends, Skin, I & O's  REASON FOR ASSESSMENT:   Consult Assessment of nutrition requirement/status  ASSESSMENT:   Justin Boone is a 69 y.o. male with medical history significant of atrial fibrillation on coumadin, bradycardia s/p medtronic CRT-P, CHF, type 2 diabetes, hypertension, severe AS, chronic kidney disease stage III, gout, COPD, hyperlipidemia, CAD s/p PCI, tobacco abuse initially presented on 11/15/2019 with worsening dyspnea.  Pt admitted with decompensated diastolic heart failure.  6/17- s/p OPERATIONS: 1. Multiple extraction of tooth numbers 11, 13, and 14 2.  One quadrant of alveoloplasty 3. Gross debridement of remaining dentition  Reviewed I/O's: -245 ml x 24 hours and -605 ml since admission  UOP: 925 ml x 24 hours  Spoke with pt at bedside, who was pleasant and in good spirits today. He reports he did well walking with physical therapy, but would like to advance his diet to something more fulling- "I almost fell out during physical therapy, because all I can eat is broth". Noted meal intake 75-100%. Pt expresses concern about mouth pain and is requesting a softer consistency diet.   Pt reports variable appetite PTA. He typically consumes 3 meals and 2 snacks per day (Breakfast: oatmeal with a pinch of salt OR sandwich; AM snack: pack of crackers; Lunch: sandwich; PM snack: pack of crackers; Dinner: meat, starch, and vegetable). Pt reports he has been more mindful of his diet, especially sodium intake. He still has  to have "a little bit" of salt, but has reduced the amount that he has used.   Plan for TAVR next week. Pt motivated to optimize nutritional status to help recover from surgery. Pt became very emotional during interview, reporting he "put my life of hold to care for my wife when she was sick"; he shared that his wife passed away in a SNF within the past year. RD provided therapeutic listening and emotional support. Reviewed low sodium diet guidelines with patient.   Labs reviewed: CBGS: 117.  NUTRITION - FOCUSED PHYSICAL EXAM:    Most Recent Value  Orbital Region No depletion  Upper Arm Region No depletion  Thoracic and Lumbar Region No depletion  Buccal Region No depletion  Temple Region No depletion  Clavicle Bone Region No depletion  Clavicle and Acromion Bone Region No depletion  Scapular Bone Region No depletion  Dorsal Hand No depletion  Patellar Region No depletion  Anterior Thigh Region No depletion  Posterior Calf Region No depletion  Edema (RD Assessment) Mild  Hair Reviewed  Eyes Reviewed  Mouth Reviewed  Skin Reviewed  Nails Reviewed       Diet Order:   Diet Order            DIET DYS 3 Room service appropriate? Yes; Fluid consistency: Thin  Diet effective now                 EDUCATION NEEDS:   Education needs have been addressed  Skin:  Skin Assessment: Skin Integrity Issues: Skin Integrity Issues:: Other (Comment), Incisions Incisions: lip Other: lt knee laceration  Last  BM:  11/23/19  Height:   Ht Readings from Last 1 Encounters:  11/29/2019 5\' 6"  (1.676 m)    Weight:   Wt Readings from Last 1 Encounters:  11/25/19 82 kg    Ideal Body Weight:  64.5 kg  BMI:  Body mass index is 29.17 kg/m.  Estimated Nutritional Needs:   Kcal:  2050-2250  Protein:  105-120 grams  Fluid:  > 2 L    Loistine Chance, RD, LDN, [place] Registered Dietitian II Certified Diabetes Care and Education Specialist Please refer to [facility] for RD and/or RD  on-call/weekend/after hours pager

## 2019-11-25 NOTE — Progress Notes (Signed)
  [place] VALVE TEAM  Patient doing well s/p dental extractions. Breathing is stable. He prefers to stay inpatient until his surgery next Tuesday since he lives so far away. He will be scheduled for 5th case on Tuesday 6/22.  Angelena Form PA-C  [facility]

## 2019-11-25 NOTE — Progress Notes (Addendum)
POST OPERATIVE NOTE:  11/25/2019 Justin Boone [id number]  VITALS: BP 117/72 (BP Location: Left Arm)   Pulse 67   Temp 98.2 F (36.8 C) (Oral)   Resp 18   Ht 5\' 6"  (1.676 m)   Wt 82 kg   SpO2 (!) 89%   BMI 29.17 kg/m   LABS:  Lab Results  Component Value Date   WBC 11.1 (H) 11/19/2019   HGB 9.5 (L) 11/12/2019   HCT 31.4 (L) 12/06/2019   MCV 96.9 11/22/2019   PLT 206 11/15/2019   BMET    Component Value Date/Time   NA 139 11/25/2019 0320   K 4.9 11/25/2019 0320   CL 104 11/25/2019 0320   CO2 26 11/25/2019 0320   GLUCOSE 139 (H) 11/25/2019 0320   BUN 57 (H) 11/25/2019 0320   CREATININE 1.92 (H) 11/25/2019 0320   CALCIUM 8.5 (L) 11/25/2019 0320   GFRNONAA 35 (L) 11/25/2019 0320   GFRAA 41 (L) 11/25/2019 0320    Lab Results  Component Value Date   INR 1.9 (H) 12/06/2019   INR 2.3 (H) 11/23/2019   INR 2.5 (H) 11/22/2019   No results found for: PTT   Justin Boone is status post extraction of remaining maxillary teeth (11,13,14) with alveoloplasty and gross.  Debridement of remaining dentition in the operating with general anesthesia on 12/04/2019.  SUBJECTIVE: Patient indicated that the extraction sites were sore.  Patient indicates he had bleeding up until 3-4 o'clock this morning, "then it stopped".  Patient denies any bleeding at this time.  EXAM: There is no sign of infection, heme, or ooze.  Sutures are intact.  Clots are present.  Ecchymoses are noted intraorally.   ASSESSMENT: Post operative course is consistent with dental procedures performed in the operating with general anesthesia. Postoperative bleeding consistent with Plavix therapy. Loss of teeth due to extraction.   PLAN: 1.  Use chlorhexidine rinses twice daily after breakfast and at bedtime. 2.  Use salt water rinses as needed every 2 hours while in emergency. 3.  Use Amicar rinses postoperatively as needed for persistent oozing. 4.  Dietary consult appreciated. 5.  Advance diet as  tolerated. 6.  Patient is cleared for heart valve surgery at the discretion of Dr. Burt Knack and Dr. Roxy Manns.    Lenn Cal, DDS

## 2019-11-25 NOTE — Plan of Care (Signed)
  Problem: Clinical Measurements: Goal: Will remain free from infection Outcome: Progressing   Problem: Activity: Goal: Risk for activity intolerance will decrease Outcome: Progressing   Problem: Pain Managment: Goal: General experience of comfort will improve Outcome: Progressing   Problem: Safety: Goal: Ability to remain free from injury will improve Outcome: Progressing   

## 2019-11-25 NOTE — Progress Notes (Signed)
Physical Therapy Treatment Patient Details Name: Justin Boone MRN: [id number] DOB: 1950-08-01 Today's Date: 11/25/2019    History of Present Illness 69 year old male with history of A. fib on Coumadin, CAD, bradycardia status post Medtronic pacer, DM 2, hypertension, severe AS, COPD, hypertension, chronic kidney disease stage IIIb with creatinine 1.9-2.3 is [name] a hospital to hospital transfer from [place] for cardiology evaluation.  He is followed by Dr. Burt Knack as an outpatient for evaluation for TAVR.  Apparently has had extensive evaluation at [facility] but patient desired to have care done at [facility].  He was admitted at OSH for the past 5 days, where he was given IV diuresis and underwent a right-sided thoracentesis for symptomatic right-sided pleural effusion.    PT Comments    Pt admitted with above diagnosis. Pt was able to ambulate today in hallway but fatigued significantly when not using device needing a sitting rest break. Pt then needed to use RW to walk back to the room and did better with it but was definitely more fatigued overall today.  Pt currently with functional limitations due to balance and endurance deficits. Pt will benefit from skilled PT to increase their independence and safety with mobility to allow discharge to the venue listed below.    Follow Up Recommendations  Home health PT;Supervision - Intermittent     Equipment Recommendations   (rollator possibly, tub bench)    Recommendations for Other Services       Precautions / Restrictions Precautions Precautions: Fall Restrictions Weight Bearing Restrictions: No    Mobility  Bed Mobility Overal bed mobility: Independent                Transfers Overall transfer level: Independent                  Ambulation/Gait Ambulation/Gait assistance: Modified independent (Device/Increase time) Gait Distance (Feet): 360 Feet (180 feet and then 180  feet) Assistive device: Rolling walker (2 wheeled);None Gait Pattern/deviations: Step-through pattern;Decreased stride length;Trunk flexed   Gait velocity interpretation: <1.31 ft/sec, indicative of household ambulator General Gait Details: Pt was able to ambulate with RW in hallways with 1 sitting rest break due to fatigue as pt attempted to ambulate without device and fatigue much greater than last session. Pt also with 1 LOB neeidng min guard assist. Used RW on return to room. DOE 3/4 after first walk and 2/4 when using RW.    Stairs             Wheelchair Mobility    Modified Rankin (Stroke Patients Only)       Balance Overall balance assessment: Needs assistance Sitting-balance support: No upper extremity supported;Feet supported Sitting balance-Leahy Scale: Fair     Standing balance support: Bilateral upper extremity supported;During functional activity Standing balance-Leahy Scale: Poor Standing balance comment: relies on RW for support                            Cognition Arousal/Alertness: Awake/alert Behavior During Therapy: WFL for tasks assessed/performed Overall Cognitive Status: Within Functional Limits for tasks assessed                                        Exercises      General Comments        Pertinent Vitals/Pain Pain Assessment: No/denies pain  Home Living                      Prior Function            PT Goals (current goals can now be found in the care plan section) Acute Rehab PT Goals Patient Stated Goal: to go home Progress towards PT goals: Progressing toward goals    Frequency    Min 3X/week      PT Plan Current plan remains appropriate    Co-evaluation              AM-PAC PT "6 Clicks" Mobility   Outcome Measure  Help needed turning from your back to your side while in a flat bed without using bedrails?: None Help needed moving from lying on your back to sitting on  the side of a flat bed without using bedrails?: None Help needed moving to and from a bed to a chair (including a wheelchair)?: None Help needed standing up from a chair using your arms (e.g., wheelchair or bedside chair)?: None Help needed to walk in hospital room?: None Help needed climbing 3-5 steps with a railing? : A Little 6 Click Score: 23    End of Session Equipment Utilized During Treatment: Gait belt Activity Tolerance: Patient tolerated treatment well Patient left: with call bell/phone within reach;in bed Nurse Communication: Mobility status PT Visit Diagnosis: Muscle weakness (generalized) (M62.81);Other abnormalities of gait and mobility (R26.89)     Time: 0102-7253 PT Time Calculation (min) (ACUTE ONLY): 16 min  Charges:  $Gait Training: 8-22 mins                     Justin Boone W,PT [place] Pager:  [phone]  Office:  [place] 11/25/2019, 1:01 PM

## 2019-11-25 NOTE — Progress Notes (Signed)
Pt refusing CPAP for tonight. Advised pt to notify for RT if he changes his mind.

## 2019-11-26 DIAGNOSIS — I350 Nonrheumatic aortic (valve) stenosis: Secondary | ICD-10-CM

## 2019-11-26 MED ORDER — ENOXAPARIN SODIUM 40 MG/0.4ML ~~LOC~~ SOLN
40.0000 mg | SUBCUTANEOUS | Status: AC
  Administered 2019-11-26 – 2019-11-28 (×3): 40 mg via SUBCUTANEOUS
  Filled 2019-11-26 (×3): qty 0.4

## 2019-11-26 MED ORDER — PREDNISONE 10 MG PO TABS
10.0000 mg | ORAL_TABLET | Freq: Every day | ORAL | Status: DC
  Administered 2019-11-27 – 2019-11-29 (×4): 10 mg via ORAL
  Filled 2019-11-26 (×4): qty 1

## 2019-11-26 NOTE — Progress Notes (Signed)
  Mobility Specialist Criteria Algorithm Info.  Mobility Team: [facility] elevated:Self regulated Activity: Ambulated in hall;Transferred:  Bed to chair (To chair after ambulation) Range of motion: Active;All extremities Level of assistance: Standby assist, set-up cues, supervision of patient - no hands on Assistive device: Front wheel walker Minutes sitting in chair:  Minutes stood: 5 minutes Minutes ambulated: 5 minutes Distance ambulated (ft): 500 ft Mobility response: Tolerated well (Needed one seated rest break) Bed Position: Chair (Lenora chair)   11/26/2019 4:42 PM

## 2019-11-26 NOTE — Progress Notes (Signed)
Progress Note  Patient Name: Justin Boone Date of Encounter: 11/26/2019  [facility] Cardiologist: Sherren Mocha, MD   Subjective   Doing well, seen up and walking the halls today. No chest pain, breathing stable.  Inpatient Medications    Scheduled Meds: . amiodarone  400 mg Oral Daily  . atorvastatin  80 mg Oral q1800  . clopidogrel  75 mg Oral Daily  . DULoxetine  60 mg Oral Daily  . metoprolol succinate  50 mg Oral Daily  . mometasone-formoterol  2 puff Inhalation BID  . multivitamin with minerals  1 tablet Oral Daily  . nicotine  21 mg Transdermal Daily  . [START ON 11/27/2019] predniSONE  10 mg Oral Q breakfast  . sodium chloride flush  3 mL Intravenous Q12H  . tamsulosin  0.4 mg Oral QPC breakfast   Continuous Infusions: . lactated ringers 10 mL/hr at 11/25/19 1642  . lactated ringers 10 mL/hr at 11/25/19 1900  . sodium chloride irrigation     PRN Meds: HYDROcodone-acetaminophen, melatonin   Vital Signs    Vitals:   11/26/19 0434 11/26/19 0724 11/26/19 0827 11/26/19 1125  BP: 113/73 115/83  117/78  Pulse: 65 65  65  Resp: 19 19  20   Temp: (!) 97.5 F (36.4 C) 97.6 F (36.4 C)  97.9 F (36.6 C)  TempSrc: Oral Oral  Oral  SpO2: 96% 96% 92% 98%  Weight:      Height:        Intake/Output Summary (Last 24 hours) at 11/26/2019 1705 Last data filed at 11/26/2019 1241 Gross per 24 hour  Intake 1123.59 ml  Output 800 ml  Net 323.59 ml   Last 3 Weights 11/26/2019 11/25/2019 12/02/2019  Weight (lbs) 183 lb 3.2 oz 180 lb 11.2 oz 181 lb 12.8 oz  Weight (kg) 83.099 kg 81.965 kg 82.464 kg      Telemetry    paced - Personally Reviewed  ECG    No new since 6/13 - Personally Reviewed  Physical Exam   GEN: No acute distress.   Neck: No JVD Cardiac: RRR, no rubs or gallops. 3/6 systolic ejection murmur with diminished S2 Respiratory: Diffusely coarse but no rales appreciated GI: Soft, nontender, non-distended  MS: No edema; No deformity. Neuro:   Nonfocal  Psych: Normal affect   Labs    High Sensitivity Troponin:  No results for input(s): TROPONINIHS in the last 720 hours.    Chemistry Recent Labs  Lab 11/22/19 0705 11/16/2019 0626 11/25/19 0320  NA 141 141 139  K 4.5 4.5 4.9  CL 104 104 104  CO2 28 27 26   GLUCOSE 116* 108* 139*  BUN 48* 51* 57*  CREATININE 2.15* 1.93* 1.92*  CALCIUM 8.9 8.8* 8.5*  PROT  --  6.1* 5.3*  ALBUMIN  --  2.8* 2.5*  AST  --  56* 35  ALT  --  86* 64*  ALKPHOS  --  94 73  BILITOT  --  1.1 0.8  GFRNONAA 31* 35* 35*  GFRAA 36* 41* 41*  ANIONGAP 9 10 9      Hematology Recent Labs  Lab 11/21/19 0903 11/22/19 0705 12/05/2019 0626  WBC 6.4 10.2 11.1*  RBC 3.03* 3.11* 3.24*  HGB 9.0* 9.2* 9.5*  HCT 30.0* 30.2* 31.4*  MCV 99.0 97.1 96.9  MCH 29.7 29.6 29.3  MCHC 30.0 30.5 30.3  RDW 19.3* 19.3* 19.2*  PLT 192 208 206    BNP Recent Labs  Lab 11/18/2019 2002  BNP 3,282.1*  DDimer No results for input(s): DDIMER in the last 168 hours.   Radiology    DG Chest 2 View  Result Date: 11/25/2019 CLINICAL DATA:  CHF. EXAM: CHEST - 2 VIEW COMPARISON:  09/23/2019. FINDINGS: AICD noted stable position. Cardiomegaly with pulmonary venous congestion and mild bilateral interstitial prominence. Moderate right-sided pleural effusion. Findings suggest CHF. Right base atelectasis. Right base infiltrate/pneumonia cannot be excluded. No pneumothorax. IMPRESSION: 1.  AICD in stable position. 2. Cardiomegaly with mild bilateral interstitial prominence. Moderate right pleural effusion. Findings suggest CHF. 3. Right base atelectasis. Right base infiltrate/pneumonia cannot be excluded. Electronically Signed   By: Marcello Moores  Register   On: 11/25/2019 09:13    Cardiac Studies   Personally reviewed prior echo  Patient Profile     69 y.o. male with a historyof CAD s/p PCI of RCA in2/2021, severe aortic stenosis with plan for TAVR with Dr. Burt Knack, atrial fibrillation/flutteron Amiodarone and Coumadin,  tachybradycardia syndrome s/p Medtronic CRT-P followed by Dr. Curt Bears, chronic combined CHF with EF 40-45% on Echo in 09/2019, COPD, hypertension,type 2diabetes, and tobacco abusewho is being seen today for the evaluation of severe aortic stenosis.  Assessment & Plan    Acute on chronic combined systolic and diastolic heart failure -now euvolemic, lasix on hold -monitor volume status and diurese as needed  -Cr 1.92  Severe Aortic stenosis -planned for TAVR 12/06/2019  CAD s/p PCI to RCA in 07/2019 - troponin negative - Plavix, BB, statin - not on ASA due to coumadin (currently held)  Afib/flutter and tachybrady syndrome s/p PPM - s/p Metronic CRT-P 09/2019 - AV paced - amiodarone 400 mg daily - coumadin on hold. Sent message to structural team to see if they would like him on heparin prior to TAVR CHA2DS2/VAS Stroke Risk Points= 5, but no prior stroke. With recent dental extractions and planned for procedure   HTN -well controlled  For questions or updates, please contact [facility] Please consult [email or web address] for contact info under     Signed, Buford Dresser, MD  11/26/2019, 5:05 PM

## 2019-11-26 NOTE — Progress Notes (Signed)
PROGRESS NOTE  Justin Boone  DOB: 12-31-1950  PCP: Verdell Carmine., MD ZOX:[id number]  DOA: 12/08/2019  LOS: 6 days   Brief narrative: Patient is a 69 year old male with history of A. fib on Coumadin, CAD, bradycardia sp Medtronic pacer, DM 2, hypertension, severe AS, COPD, hypertension, chronic kidney disease stage IIIb with creatinine 1.9-2.3. Patient was hospitalized (6/7-6/12) at an outside hospital ([facility]) for decompensated heart failure and COPD exacerbation.  He was treated with IV diuretics, IV Rocephin and steroids.  His renal function worsened from creatinine of 1.7 on 6/7 to 2.1 on 6/10.  6/7- CT chest showed right greater than left pleural effusion from apex to base with compressive volume loss of right middle lobe.   6/8 - He underwent thoracentesis with 1.1 L removed. 6/10 - Echocardiogram showed EF of 50 to 55%, abnormal left ventricular diastolic filling, multiple regional wall motion abnormalities, RVSP estimated to be 55 to 60 mmHg, moderate AAS, moderate to severe MR 6/12-patient was transferred from outside hospital to [facility] primarily for cardiology and nephrology evaluation.  Subjective: Patient was seen and examined this morning.  Pleasant elderly Caucasian male.  Lying down in bed.  Not in distress. Waiting for TAVR next week.  Assessment/Plan: Acute on chronic diastolic CHF Essential hypertension -Diuresis done with IV Lasix in outside hospital. -With rising creatinine, Lasix is currently on hold.  Creatinine is stable at baseline at this time. -Continue to monitor blood pressure, renal function and electrolytes.  Symptomatic severe aortic stenosis -structural heart team consulted, patient seen by Dr. Burt Knack.  -Plans are in place for TAVR next Tuesday -As preop work-up, patient underwent upper teeth extraction on 6/16.     COPD exacerbation -managed at outside hospital with oral steroids, nebulizer.   -Continue oral steroid taper off.   Currently on prednisone 20 mg daily.  Taper down to 10 mg tomorrow. -Continue to monitor breathing status.   CAD s/p PCI -no chest pain. -Continue metoprolol, atorvastatin, Plavix.  History of A. fib/flutter, PPM in place -continue amiodarone.  Coumadin is currently on hold. -I discussed with Dr. Harrell Gave about the potential need of heparin drip while off Coumadin.  She is going to decide this this with TAVR team.   Chronic kidney disease stage IIIb- -Baseline creatinine 1.9-2.3, currently at baseline.   Type 2 diabetes mellitus -Last A1c 5.3, not on medications  BPH -Flomax.   Tobacco use -Smokes a pack a day.  Counseled for cessation   Mobility: Encourage ambulation Code Status:  Full code  DVT prophylaxis: SCDs Start: 11/19/2019 1433  Antimicrobials: Not on scheduled antibiotics. Fluid: None Diet: Cardiac/diabetic diet  Consultants: Cardiology, cardiac surgery, dentistry Family Communication:  Not at bedside  Status is: Inpatient  Remains inpatient appropriate because: Waiting for TAVR next week.  Dispo: The patient is from: Home              Anticipated d/c is to: Home              Anticipated d/c date is: > 3 days              Patient currently is not medically stable to d/c.        Antimicrobials: Anti-infectives (From admission, onward)   Start     Dose/Rate Route Frequency Ordered Stop   11/11/2019 1145  ceFAZolin (ANCEF) IVPB 2g/100 mL premix        2 g 200 mL/hr over 30 Minutes Intravenous To Short Stay 11/23/19 0940 11/28/2019 1245  Code Status: Full Code   Diet Order            DIET DYS 3 Room service appropriate? Yes; Fluid consistency: Thin  Diet effective now                 Infusions:  . lactated ringers 10 mL/hr at 11/25/19 1642  . lactated ringers 10 mL/hr at 11/25/19 1900  . sodium chloride irrigation      Scheduled Meds: . amiodarone  400 mg Oral Daily  . atorvastatin  80 mg Oral q1800  . clopidogrel  75 mg Oral  Daily  . DULoxetine  60 mg Oral Daily  . metoprolol succinate  50 mg Oral Daily  . mometasone-formoterol  2 puff Inhalation BID  . multivitamin with minerals  1 tablet Oral Daily  . nicotine  21 mg Transdermal Daily  . predniSONE  20 mg Oral Q breakfast  . sodium chloride flush  3 mL Intravenous Q12H  . tamsulosin  0.4 mg Oral QPC breakfast    PRN meds: HYDROcodone-acetaminophen, melatonin   Objective: Vitals:   11/26/19 0827 11/26/19 1125  BP:  117/78  Pulse:  65  Resp:  20  Temp:  97.9 F (36.6 C)  SpO2: 92% 98%    Intake/Output Summary (Last 24 hours) at 11/26/2019 1553 Last data filed at 11/26/2019 1241 Gross per 24 hour  Intake 1123.59 ml  Output 800 ml  Net 323.59 ml   Filed Weights   11/13/2019 1133 11/25/19 0054 11/26/19 0054  Weight: 82.5 kg 82 kg 83.1 kg   Weight change: 0.635 kg Body mass index is 29.57 kg/m.   Physical Exam: General exam: Appears calm and comfortable.  Not in physical distress. Skin: No rashes, lesions or ulcers. HEENT: Atraumatic, normocephalic, supple neck, no obvious bleeding Lungs: Clear to auscultation bilaterally CVS: Regular rate and rhythm, ejection systolic murmur present GI/Abd soft, nontender, nondistended, bowel sound present CNS: Alert, awake, confused, slow to respond Psychiatry: Mood appropriate Extremities: no calf tenderness  Data Review: I have personally reviewed the laboratory data and studies available.  Recent Labs  Lab 11/16/2019 2002 11/21/19 0903 11/22/19 0705 12/01/2019 0626  WBC 9.5 6.4 10.2 11.1*  HGB 9.3* 9.0* 9.2* 9.5*  HCT 31.2* 30.0* 30.2* 31.4*  MCV 98.7 99.0 97.1 96.9  PLT 198 192 208 206   Recent Labs  Lab 12/08/2019 2002 11/21/19 0903 11/22/19 0705 11/27/2019 0626 11/25/19 0320  NA 140 139 141 141 139  K 5.2* 5.0 4.5 4.5 4.9  CL 102 102 104 104 104  CO2 29 26 28 27 26   GLUCOSE 144* 173* 116* 108* 139*  BUN 37* 43* 48* 51* 57*  CREATININE 2.19* 2.00* 2.15* 1.93* 1.92*  CALCIUM 9.0 8.9  8.9 8.8* 8.5*  MG 2.2  --   --   --   --    Signed, Terrilee Croak, MD [facility] Pager: [phone] (Secure Chat preferred). 11/26/2019

## 2019-11-27 DIAGNOSIS — I5033 Acute on chronic diastolic (congestive) heart failure: Secondary | ICD-10-CM

## 2019-11-27 LAB — CBC
HCT: 28.6 % — ABNORMAL LOW (ref 39.0–52.0)
Hemoglobin: 8.6 g/dL — ABNORMAL LOW (ref 13.0–17.0)
MCH: 29.3 pg (ref 26.0–34.0)
MCHC: 30.1 g/dL (ref 30.0–36.0)
MCV: 97.3 fL (ref 80.0–100.0)
Platelets: 171 10*3/uL (ref 150–400)
RBC: 2.94 MIL/uL — ABNORMAL LOW (ref 4.22–5.81)
RDW: 19.3 % — ABNORMAL HIGH (ref 11.5–15.5)
WBC: 10 10*3/uL (ref 4.0–10.5)
nRBC: 0 % (ref 0.0–0.2)

## 2019-11-27 NOTE — Progress Notes (Signed)
PROGRESS NOTE  Justin Boone  DOB: 1950-08-01  PCP: Verdell Carmine., MD VCB:[id number]  DOA: 11/26/2019  LOS: 7 days   Brief narrative: Patient is a 69 year old male with history of A. fib on Coumadin, CAD, bradycardia sp Medtronic pacer, DM 2, hypertension, severe AS, COPD, hypertension, chronic kidney disease stage IIIb with creatinine 1.9-2.3. Patient was hospitalized (6/7-6/12) at an outside hospital ([place]) for decompensated heart failure and COPD exacerbation.  He was treated with IV diuretics, IV Rocephin and steroids.  His renal function worsened from creatinine of 1.7 on 6/7 to 2.1 on 6/10.  6/7- CT chest showed right greater than left pleural effusion from apex to base with compressive volume loss of right middle lobe.   6/8 - He underwent thoracentesis with 1.1 L removed. 6/10 - Echocardiogram showed EF of 50 to 55%, abnormal left ventricular diastolic filling, multiple regional wall motion abnormalities, RVSP estimated to be 55 to 60 mmHg, moderate AAS, moderate to severe MR 6/12-patient was transferred from outside hospital to [facility] primarily for cardiology and nephrology evaluation.  Subjective: Patient was seen and examined this morning.  Pleasant elderly Caucasian male.  Lying down in bed.  Not in distress. Waiting for TAVR next week.  Assessment/Plan: Acute on chronic diastolic CHF Essential hypertension -Diuresis done with IV Lasix in outside hospital. -With rising creatinine, Lasix is currently on hold. Creatinine is stable at baseline at this time. -Continue to monitor blood pressure, renal function and electrolytes.  Symptomatic severe aortic stenosis -structural heart team consulted, patient seen by Dr. Burt Knack.  -Plans are in place for TAVR next Tuesday -As preop work-up, patient underwent upper teeth extraction on 6/16.     COPD exacerbation -managed at outside hospital with oral steroids, nebulizer.   -Continue oral steroid taper off.   Currently on prednisone 10 mg daily. -Continue to monitor breathing status.   CAD s/p PCI -no chest pain. -Continue metoprolol, atorvastatin, Plavix.  History of A. fib/flutter, PPM in place -continue amiodarone.  Coumadin is currently on hold for TAVR next week.-   Chronic kidney disease stage IIIb- -Baseline creatinine 1.9-2.3, currently at baseline.   Type 2 diabetes mellitus -Last A1c 5.3, not on medications  BPH -Flomax.   Tobacco use -Smokes a pack a day.  Counseled for cessation   Mobility: Encourage ambulation Code Status:  Full code  DVT prophylaxis: enoxaparin (LOVENOX) injection 40 mg Start: 11/26/19 1900 SCDs Start: 12/07/2019 1433  Antimicrobials: Not on scheduled antibiotics. Fluid: None Diet: Cardiac/diabetic diet  Consultants: Cardiology, cardiac surgery, dentistry Family Communication:  Not at bedside  Status is: Inpatient  Remains inpatient appropriate because: Waiting for TAVR next week.  Dispo: The patient is from: Home              Anticipated d/c is to: Home              Anticipated d/c date is: > 3 days              Patient currently is not medically stable to d/c.        Antimicrobials: Anti-infectives (From admission, onward)   Start     Dose/Rate Route Frequency Ordered Stop   11/19/2019 1145  ceFAZolin (ANCEF) IVPB 2g/100 mL premix        2 g 200 mL/hr over 30 Minutes Intravenous To Short Stay 11/23/19 0940 12/05/2019 1245        Code Status: Full Code   Diet Order  DIET DYS 3 Room service appropriate? Yes; Fluid consistency: Thin  Diet effective now                 Infusions:  . lactated ringers 10 mL/hr at 11/25/19 1642  . lactated ringers 10 mL/hr at 11/25/19 1900  . sodium chloride irrigation      Scheduled Meds: . amiodarone  400 mg Oral Daily  . atorvastatin  80 mg Oral q1800  . clopidogrel  75 mg Oral Daily  . DULoxetine  60 mg Oral Daily  . enoxaparin (LOVENOX) injection  40 mg Subcutaneous Q24H    . metoprolol succinate  50 mg Oral Daily  . mometasone-formoterol  2 puff Inhalation BID  . multivitamin with minerals  1 tablet Oral Daily  . nicotine  21 mg Transdermal Daily  . predniSONE  10 mg Oral Q breakfast  . sodium chloride flush  3 mL Intravenous Q12H  . tamsulosin  0.4 mg Oral QPC breakfast    PRN meds: HYDROcodone-acetaminophen, melatonin   Objective: Vitals:   11/27/19 0939 11/27/19 1132  BP: 107/64 118/74  Pulse: 66 79  Resp:  16  Temp:  97.6 F (36.4 C)  SpO2: 96% 95%    Intake/Output Summary (Last 24 hours) at 11/27/2019 1345 Last data filed at 11/27/2019 1315 Gross per 24 hour  Intake 1020 ml  Output 750 ml  Net 270 ml   Filed Weights   11/25/19 0054 11/26/19 0054 11/27/19 0617  Weight: 82 kg 83.1 kg 83.6 kg   Weight change: 0.544 kg Body mass index is 29.76 kg/m.   Physical Exam: General exam: Appears calm and comfortable.  Not in physical distress. Skin: No rashes, lesions or ulcers. HEENT: Atraumatic, normocephalic, supple neck, no obvious bleeding Lungs: Clear to auscultation bilaterally CVS: Regular rate and rhythm, ejection systolic murmur present GI/Abd soft, nontender, nondistended, bowel sound present CNS: Alert, awake, confused, slow to respond Psychiatry: Mood appropriate Extremities: no calf tenderness  Data Review: I have personally reviewed the laboratory data and studies available.  Recent Labs  Lab 11/19/2019 2002 11/21/19 0903 11/22/19 0705 11/23/2019 0626 11/27/19 0612  WBC 9.5 6.4 10.2 11.1* 10.0  HGB 9.3* 9.0* 9.2* 9.5* 8.6*  HCT 31.2* 30.0* 30.2* 31.4* 28.6*  MCV 98.7 99.0 97.1 96.9 97.3  PLT 198 192 208 206 171   Recent Labs  Lab 11/25/2019 2002 11/21/19 0903 11/22/19 0705 11/23/2019 0626 11/25/19 0320  NA 140 139 141 141 139  K 5.2* 5.0 4.5 4.5 4.9  CL 102 102 104 104 104  CO2 29 26 28 27 26   GLUCOSE 144* 173* 116* 108* 139*  BUN 37* 43* 48* 51* 57*  CREATININE 2.19* 2.00* 2.15* 1.93* 1.92*  CALCIUM 9.0 8.9  8.9 8.8* 8.5*  MG 2.2  --   --   --   --    Signed, Terrilee Croak, MD [facility] Pager: [phone] (Secure Chat preferred). 11/27/2019

## 2019-11-27 NOTE — Anesthesia Postprocedure Evaluation (Signed)
Anesthesia Post Note  Patient: Justin Boone  Procedure(s) Performed: Extraction of tooth #'s 11,13, and 14 with alveoloplasty and gross debridement of remaining dentition (Bilateral Mouth)     Patient location during evaluation: PACU Anesthesia Type: General Level of consciousness: patient cooperative and awake Pain management: pain level controlled Vital Signs Assessment: post-procedure vital signs reviewed and stable Respiratory status: spontaneous breathing, nonlabored ventilation, respiratory function stable and patient connected to nasal cannula oxygen Cardiovascular status: blood pressure returned to baseline and stable Postop Assessment: no apparent nausea or vomiting Anesthetic complications: no   No complications documented.  Last Vitals:  Vitals:   11/26/19 2047 11/27/19 0617  BP: 105/77 114/66  Pulse: 66 63  Resp: 20 19  Temp: 36.8 C 36.6 C  SpO2: 97% 95%    Last Pain:  Vitals:   11/27/19 0617  TempSrc: Oral  PainSc:                  Justin Boone

## 2019-11-27 NOTE — Progress Notes (Signed)
Progress Note  Patient Name: Justin Boone Date of Encounter: 11/27/2019  [facility] Cardiologist: Sherren Mocha, MD   Subjective   Doing well without shortness of breath Inpatient Medications    Scheduled Meds: . amiodarone  400 mg Oral Daily  . atorvastatin  80 mg Oral q1800  . clopidogrel  75 mg Oral Daily  . DULoxetine  60 mg Oral Daily  . enoxaparin (LOVENOX) injection  40 mg Subcutaneous Q24H  . metoprolol succinate  50 mg Oral Daily  . mometasone-formoterol  2 puff Inhalation BID  . multivitamin with minerals  1 tablet Oral Daily  . nicotine  21 mg Transdermal Daily  . predniSONE  10 mg Oral Q breakfast  . sodium chloride flush  3 mL Intravenous Q12H  . tamsulosin  0.4 mg Oral QPC breakfast   Continuous Infusions: . lactated ringers 10 mL/hr at 11/25/19 1642  . lactated ringers 10 mL/hr at 11/25/19 1900  . sodium chloride irrigation     PRN Meds: HYDROcodone-acetaminophen, melatonin   Vital Signs    Vitals:   11/27/19 0743 11/27/19 0934 11/27/19 0939 11/27/19 1132  BP:   107/64 118/74  Pulse:  62 66 79  Resp:    16  Temp:  97.9 F (36.6 C)  97.6 F (36.4 C)  TempSrc:  Oral  Oral  SpO2: 96% 97% 96% 95%  Weight:      Height:        Intake/Output Summary (Last 24 hours) at 11/27/2019 1201 Last data filed at 11/27/2019 0819 Gross per 24 hour  Intake 960 ml  Output 750 ml  Net 210 ml   Last 3 Weights 11/27/2019 11/26/2019 11/25/2019  Weight (lbs) 184 lb 6.4 oz 183 lb 3.2 oz 180 lb 11.2 oz  Weight (kg) 83.643 kg 83.099 kg 81.965 kg      Telemetry    AV paced- Personally Reviewed  ECG    No new since 6/13 - Personally Reviewed  Physical Exam   GEN: Well nourished, well developed, in no acute distress  HEENT: normal  Neck: no JVD, carotid bruits, or masses Cardiac: RRR; no murmurs, rubs, or gallops,no edema  Respiratory:  clear to auscultation bilaterally, normal work of breathing GI: soft, nontender, nondistended, + BS MS: no deformity  or atrophy  Skin: warm and dry Neuro:  Strength and sensation are intact Psych: euthymic mood, full affect   Labs    High Sensitivity Troponin:  No results for input(s): TROPONINIHS in the last 720 hours.    Chemistry Recent Labs  Lab 11/22/19 0705 11/14/2019 0626 11/25/19 0320  NA 141 141 139  K 4.5 4.5 4.9  CL 104 104 104  CO2 28 27 26   GLUCOSE 116* 108* 139*  BUN 48* 51* 57*  CREATININE 2.15* 1.93* 1.92*  CALCIUM 8.9 8.8* 8.5*  PROT  --  6.1* 5.3*  ALBUMIN  --  2.8* 2.5*  AST  --  56* 35  ALT  --  86* 64*  ALKPHOS  --  94 73  BILITOT  --  1.1 0.8  GFRNONAA 31* 35* 35*  GFRAA 36* 41* 41*  ANIONGAP 9 10 9      Hematology Recent Labs  Lab 11/22/19 0705 12/06/2019 0626 11/27/19 0612  WBC 10.2 11.1* 10.0  RBC 3.11* 3.24* 2.94*  HGB 9.2* 9.5* 8.6*  HCT 30.2* 31.4* 28.6*  MCV 97.1 96.9 97.3  MCH 29.6 29.3 29.3  MCHC 30.5 30.3 30.1  RDW 19.3* 19.2* 19.3*  PLT 208 206  171    BNP Recent Labs  Lab 11/26/2019 2002  BNP 3,282.1*     DDimer No results for input(s): DDIMER in the last 168 hours.   Radiology    No results found.  Cardiac Studies   Personally reviewed prior echo  Patient Profile     69 y.o. male with a historyof CAD s/p PCI of RCA in2/2021, severe aortic stenosis with plan for TAVR with Dr. Burt Knack, atrial fibrillation/flutteron Amiodarone and Coumadin, tachybradycardia syndrome s/p Medtronic CRT-P followed by Dr. Curt Bears, chronic combined CHF with EF 40-45% on Echo in 09/2019, COPD, hypertension,type 2diabetes, and tobacco abusewho is being seen today for the evaluation of severe aortic stenosis.  Assessment & Plan    Acute on chronic combined systolic and diastolic heart failure Currently euvolemic with Lasix on hold.  Continuing to monitor fluid status and diurese as needed.e status and diurese as needed  -Cr 1.92  Severe Aortic stenosis Plan TAVR 11/16/2019  CAD s/p PCI to RCA in 07/2019 Troponin negative.  Currently on Plavix  beta-blocker statin.  Afib/flutter and tachybrady syndrome s/p PPM Status post Medtronic CRT-P April 2021.  AV paced on amiodarone.  Holding Coumadin for TAVR plan  HTN Well controlled  Cardiology Avrianna Smart see as needed through the weekend.  For questions or updates, please contact [place] Please consult [email or web address] for contact info under     Signed, Kainoa Swoboda Meredith Leeds, MD  11/27/2019, 12:01 PM

## 2019-11-28 LAB — GLUCOSE, CAPILLARY
Glucose-Capillary: 127 mg/dL — ABNORMAL HIGH (ref 70–99)
Glucose-Capillary: 164 mg/dL — ABNORMAL HIGH (ref 70–99)

## 2019-11-28 NOTE — Progress Notes (Signed)
PROGRESS NOTE  Justin Boone  DOB: August 08, 1950  PCP: Verdell Carmine., MD OAC:[id number]  DOA: 12/03/2019  LOS: 8 days   Brief narrative: Patient is a 69 year old male with history of A. fib on Coumadin, CAD, bradycardia sp Medtronic pacer, DM 2, hypertension, severe AS, COPD, hypertension, chronic kidney disease stage IIIb with creatinine 1.9-2.3. Patient was hospitalized (6/7-6/12) at an outside hospital ([facility]) for decompensated heart failure and COPD exacerbation.  He was treated with IV diuretics, IV Rocephin and steroids.  His renal function worsened from creatinine of 1.7 on 6/7 to 2.1 on 6/10.  6/7- CT chest showed right greater than left pleural effusion from apex to base with compressive volume loss of right middle lobe.   6/8 - He underwent thoracentesis with 1.1 L removed. 6/10 - Echocardiogram showed EF of 50 to 55%, abnormal left ventricular diastolic filling, multiple regional wall motion abnormalities, RVSP estimated to be 55 to 60 mmHg, moderate AAS, moderate to severe MR 6/12-patient was transferred from outside hospital to [facility] primarily for cardiology and nephrology evaluation.  Subjective: Patient was seen and examined this afternoon.  Elderly Caucasian male.  Sitting up in bed.  Complains of dental pain today.  Son at bedside. Waiting for TAVR on Tuesday  Assessment/Plan: Acute on chronic diastolic CHF Essential hypertension -Diuresis done with IV Lasix in outside hospital. -With rising creatinine, Lasix is currently on hold. Creatinine is stable at baseline at this time. -Last creatinine was 1.92 on 6/19. -Continue to monitor blood pressure, renal function and electrolytes.  Symptomatic severe aortic stenosis -structural heart team consulted, patient was seen by Dr. Burt Knack.  -Plans are in place for TAVR next Tuesday -As preop work-up, patient underwent upper teeth extraction on 6/16.   -Continue pain control with Vicodin as needed.   COPD  exacerbation -managed at outside hospital with oral steroids, nebulizer.   -Continue oral steroid taper off.  Currently on prednisone 10 mg daily. -Continue to monitor breathing status.   CAD s/p PCI -no chest pain. -Continue metoprolol, atorvastatin, Plavix.  History of A. fib/flutter, PPM in place -continue amiodarone.  Coumadin is currently on hold for TAVR next week.   Chronic kidney disease stage IIIb- -Baseline creatinine 1.9-2.3, currently at baseline.   Type 2 diabetes mellitus -Last A1c 5.3, not on medications  BPH -Flomax.   Tobacco use -Smokes a pack a day.  Counseled for cessation   Mobility: Encourage ambulation Code Status:  Full code DVT prophylaxis: enoxaparin (LOVENOX) injection 40 mg Start: 11/26/19 1900 SCDs Start: 11/26/2019 1433  Antimicrobials: Not on scheduled antibiotics. Fluid: None Diet: Cardiac/diabetic diet  Consultants: Cardiology, cardiac surgery, dentistry Family Communication:  Not at bedside  Status is: Inpatient  Remains inpatient appropriate because: Waiting for TAVR next week.  Dispo: The patient is from: Home              Anticipated d/c is to: Home              Anticipated d/c date is: > 3 days              Patient currently is not medically stable to d/c.  Antimicrobials: Anti-infectives (From admission, onward)   Start     Dose/Rate Route Frequency Ordered Stop   12/02/2019 1145  ceFAZolin (ANCEF) IVPB 2g/100 mL premix        2 g 200 mL/hr over 30 Minutes Intravenous To Short Stay 11/23/19 0940 12/01/2019 1245        Code Status: Full  Code   Diet Order            DIET DYS 3 Room service appropriate? Yes; Fluid consistency: Thin  Diet effective now                 Infusions:  . sodium chloride irrigation      Scheduled Meds: . amiodarone  400 mg Oral Daily  . atorvastatin  80 mg Oral q1800  . clopidogrel  75 mg Oral Daily  . DULoxetine  60 mg Oral Daily  . enoxaparin (LOVENOX) injection  40 mg Subcutaneous Q24H   . metoprolol succinate  50 mg Oral Daily  . mometasone-formoterol  2 puff Inhalation BID  . multivitamin with minerals  1 tablet Oral Daily  . nicotine  21 mg Transdermal Daily  . predniSONE  10 mg Oral Q breakfast  . sodium chloride flush  3 mL Intravenous Q12H  . tamsulosin  0.4 mg Oral QPC breakfast    PRN meds: HYDROcodone-acetaminophen, melatonin   Objective: Vitals:   11/28/19 1019 11/28/19 1020  BP:  (!) 112/58  Pulse: 63 63  Resp:    Temp: 97.6 F (36.4 C)   SpO2: 93%     Intake/Output Summary (Last 24 hours) at 11/28/2019 1442 Last data filed at 11/28/2019 0349 Gross per 24 hour  Intake 700 ml  Output 100 ml  Net 600 ml   Filed Weights   11/26/19 0054 11/27/19 0617 11/28/19 0340  Weight: 83.1 kg 83.6 kg 83.7 kg   Weight change: 0.091 kg Body mass index is 29.8 kg/m.   Physical Exam: General exam: Appears calm and comfortable.  Mild distress from toothache today Skin: No rashes, lesions or ulcers. HEENT: Atraumatic, normocephalic, supple neck, no obvious bleeding Lungs: Clear to auscultation bilaterally CVS: Regular rate and rhythm, ejection systolic murmur present. GI/Abd soft, nontender, nondistended, bowel sound present. CNS: Alert, awake, confused, slow to respond Psychiatry: Mood appropriate Extremities: no calf tenderness  Data Review: I have personally reviewed the laboratory data and studies available.  Recent Labs  Lab 11/22/19 0705 11/13/2019 0626 11/27/19 0612  WBC 10.2 11.1* 10.0  HGB 9.2* 9.5* 8.6*  HCT 30.2* 31.4* 28.6*  MCV 97.1 96.9 97.3  PLT 208 206 171   Recent Labs  Lab 11/22/19 0705 11/17/2019 0626 11/25/19 0320  NA 141 141 139  K 4.5 4.5 4.9  CL 104 104 104  CO2 28 27 26   GLUCOSE 116* 108* 139*  BUN 48* 51* 57*  CREATININE 2.15* 1.93* 1.92*  CALCIUM 8.9 8.8* 8.5*   Signed, Terrilee Croak, MD [facility] Pager: [phone] (Secure Chat preferred). 11/28/2019

## 2019-11-28 NOTE — Progress Notes (Signed)
Patient has home CPAP unit at bedside. Patient able to place himself on/off as needed. Patient aware to call for assistance if needed.

## 2019-11-29 ENCOUNTER — Ambulatory Visit: Payer: Medicare HMO | Admitting: Physical Therapy

## 2019-11-29 ENCOUNTER — Encounter: Payer: Medicare HMO | Admitting: Thoracic Surgery (Cardiothoracic Vascular Surgery)

## 2019-11-29 DIAGNOSIS — I480 Paroxysmal atrial fibrillation: Secondary | ICD-10-CM

## 2019-11-29 DIAGNOSIS — N1832 Chronic kidney disease, stage 3b: Secondary | ICD-10-CM

## 2019-11-29 LAB — BASIC METABOLIC PANEL
Anion gap: 8 (ref 5–15)
BUN: 57 mg/dL — ABNORMAL HIGH (ref 8–23)
CO2: 26 mmol/L (ref 22–32)
Calcium: 8.6 mg/dL — ABNORMAL LOW (ref 8.9–10.3)
Chloride: 104 mmol/L (ref 98–111)
Creatinine, Ser: 2.09 mg/dL — ABNORMAL HIGH (ref 0.61–1.24)
GFR calc Af Amer: 37 mL/min — ABNORMAL LOW (ref >60)
GFR calc non Af Amer: 32 mL/min — ABNORMAL LOW (ref >60)
Glucose, Bld: 117 mg/dL — ABNORMAL HIGH (ref 70–99)
Potassium: 5.7 mmol/L — ABNORMAL HIGH (ref 3.5–5.1)
Sodium: 138 mmol/L (ref 135–145)

## 2019-11-29 LAB — MRSA PCR SCREENING: MRSA by PCR: NEGATIVE

## 2019-11-29 LAB — URINALYSIS, ROUTINE W REFLEX MICROSCOPIC
Bacteria, UA: NONE SEEN
Bilirubin Urine: NEGATIVE
Glucose, UA: NEGATIVE mg/dL
Ketones, ur: NEGATIVE mg/dL
Leukocytes,Ua: NEGATIVE
Nitrite: NEGATIVE
Protein, ur: 100 mg/dL — AB
Specific Gravity, Urine: 1.023 (ref 1.005–1.030)
pH: 5 (ref 5.0–8.0)

## 2019-11-29 LAB — PROTIME-INR
INR: 1.4 — ABNORMAL HIGH (ref 0.8–1.2)
Prothrombin Time: 16.3 seconds — ABNORMAL HIGH (ref 11.4–15.2)

## 2019-11-29 LAB — BRAIN NATRIURETIC PEPTIDE: B Natriuretic Peptide: 2503.4 pg/mL — ABNORMAL HIGH (ref 0.0–100.0)

## 2019-11-29 LAB — ABO/RH: ABO/RH(D): O POS

## 2019-11-29 MED ORDER — SODIUM CHLORIDE 0.9 % IV SOLN
1.5000 g | INTRAVENOUS | Status: AC
  Administered 2019-11-30: 1.5 g via INTRAVENOUS
  Filled 2019-11-29 (×2): qty 1.5

## 2019-11-29 MED ORDER — CHLORHEXIDINE GLUCONATE 4 % EX LIQD
1.0000 "application " | Freq: Once | CUTANEOUS | Status: AC
  Administered 2019-11-30: 1 via TOPICAL
  Filled 2019-11-29: qty 15

## 2019-11-29 MED ORDER — DIPHENHYDRAMINE HCL 50 MG/ML IJ SOLN
50.0000 mg | Freq: Once | INTRAMUSCULAR | Status: AC
  Administered 2019-11-30: 50 mg via INTRAVENOUS
  Filled 2019-11-29: qty 1

## 2019-11-29 MED ORDER — POTASSIUM CHLORIDE 2 MEQ/ML IV SOLN
80.0000 meq | INTRAVENOUS | Status: DC
  Filled 2019-11-29: qty 40

## 2019-11-29 MED ORDER — SODIUM CHLORIDE 0.9 % IV SOLN
INTRAVENOUS | Status: DC
  Filled 2019-11-29: qty 30

## 2019-11-29 MED ORDER — DEXMEDETOMIDINE HCL IN NACL 400 MCG/100ML IV SOLN
0.1000 ug/kg/h | INTRAVENOUS | Status: AC
  Administered 2019-11-30: .7 ug/kg/h via INTRAVENOUS
  Administered 2019-11-30: 41.8 ug via INTRAVENOUS
  Filled 2019-11-29: qty 100

## 2019-11-29 MED ORDER — PREDNISONE 50 MG PO TABS
50.0000 mg | ORAL_TABLET | Freq: Four times a day (QID) | ORAL | Status: AC
  Administered 2019-11-29 – 2019-11-30 (×3): 50 mg via ORAL
  Filled 2019-11-29 (×3): qty 1

## 2019-11-29 MED ORDER — VANCOMYCIN HCL 1250 MG/250ML IV SOLN
1250.0000 mg | INTRAVENOUS | Status: AC
  Administered 2019-11-30: 1250 mg via INTRAVENOUS
  Filled 2019-11-29: qty 250

## 2019-11-29 MED ORDER — BISACODYL 5 MG PO TBEC
5.0000 mg | DELAYED_RELEASE_TABLET | Freq: Once | ORAL | Status: AC
  Administered 2019-11-29: 5 mg via ORAL
  Filled 2019-11-29: qty 1

## 2019-11-29 MED ORDER — MAGNESIUM SULFATE 50 % IJ SOLN
40.0000 meq | INTRAMUSCULAR | Status: DC
  Filled 2019-11-29: qty 9.85

## 2019-11-29 MED ORDER — CHLORHEXIDINE GLUCONATE 0.12 % MT SOLN
15.0000 mL | Freq: Once | OROMUCOSAL | Status: AC
  Administered 2019-11-30: 15 mL via OROMUCOSAL
  Filled 2019-11-29: qty 15

## 2019-11-29 MED ORDER — NOREPINEPHRINE 4 MG/250ML-% IV SOLN
0.0000 ug/min | INTRAVENOUS | Status: DC
  Filled 2019-11-29: qty 250

## 2019-11-29 MED ORDER — DIPHENHYDRAMINE HCL 25 MG PO CAPS: 50.0000 mg | ORAL_CAPSULE | Freq: Once | ORAL | Status: AC

## 2019-11-29 MED ORDER — TEMAZEPAM 7.5 MG PO CAPS
15.0000 mg | ORAL_CAPSULE | Freq: Once | ORAL | Status: AC | PRN
  Administered 2019-11-30: 15 mg via ORAL
  Filled 2019-11-29: qty 2

## 2019-11-29 NOTE — Progress Notes (Signed)
TCTS BRIEF PROGRESS NOTE  5 Days Post-Op  S/P Procedure(s) (LRB): Extraction of tooth #'s 11,13, and 14 with alveoloplasty and gross debridement of remaining dentition (Bilateral)   For TAVR tomorrow.  All questions answered.  Rexene Alberts, MD 11/29/2019 9:40 PM

## 2019-11-29 NOTE — Progress Notes (Signed)
PT Cancellation Note  Patient Details Name: Rockford Leinen MRN: [id number] DOB: [birth year]   Cancelled Treatment:    Reason Eval/Treat Not Completed: Other (comment).  Politely declined PT reporting being too tired and not feeling like he can tolerate therapy.  Agreed to be seen after his procedure.  Retry at another time.   Ramond Dial 11/29/2019, 5:35 PM   Mee Hives, PT MS Acute Rehab Dept. Number: [place]

## 2019-11-29 NOTE — Progress Notes (Signed)
  [facility]   MULTIDISCIPLINARY HEART VALVE TEAM   Inpatient TAVR orders placed. Pre medication for contrast dye allergy.   Angelena Form PA-C  [facility]

## 2019-11-29 NOTE — Progress Notes (Signed)
Progress Note  Patient Name: Justin Boone Date of Encounter: 11/29/2019  [facility] Cardiologist: Sherren Mocha, MD   Subjective   Feels well, able to lie fully supine in bed without dyspnea.  Wants to get the procedure over with. In/out essentially neutral. Maintaining normal rhythm.  Inpatient Medications    Scheduled Meds: . amiodarone  400 mg Oral Daily  . atorvastatin  80 mg Oral q1800  . clopidogrel  75 mg Oral Daily  . DULoxetine  60 mg Oral Daily  . [START ON 12/04/2019] magnesium sulfate  40 mEq Other To OR  . metoprolol succinate  50 mg Oral Daily  . mometasone-formoterol  2 puff Inhalation BID  . multivitamin with minerals  1 tablet Oral Daily  . nicotine  21 mg Transdermal Daily  . [START ON 11/28/2019] potassium chloride  80 mEq Other To OR  . predniSONE  10 mg Oral Q breakfast  . sodium chloride flush  3 mL Intravenous Q12H  . tamsulosin  0.4 mg Oral QPC breakfast   Continuous Infusions: . [START ON 11/10/2019] cefUROXime (ZINACEF)  IV    . [START ON 11/15/2019] dexmedetomidine    . [START ON 11/25/2019] heparin 30,000 units/NS 1000 mL solution for CELLSAVER    . [START ON 11/29/2019] norepinephrine (LEVOPHED) Adult infusion    . sodium chloride irrigation    . [START ON 11/28/2019] vancomycin     PRN Meds: HYDROcodone-acetaminophen, melatonin   Vital Signs    Vitals:   11/28/19 1939 11/28/19 2054 11/29/19 0405 11/29/19 0807  BP: 122/74  131/70   Pulse: 63  62 74  Resp: 15  15 19   Temp: 98.8 F (37.1 C)  97.7 F (36.5 C)   TempSrc: Oral  Oral   SpO2: 97% 92% 96% 94%  Weight:   83.6 kg   Height:        Intake/Output Summary (Last 24 hours) at 11/29/2019 1035 Last data filed at 11/29/2019 0900 Gross per 24 hour  Intake 1080 ml  Output 600 ml  Net 480 ml   Last 3 Weights 11/29/2019 11/28/2019 11/27/2019  Weight (lbs) 184 lb 6.4 oz 184 lb 9.6 oz 184 lb 6.4 oz  Weight (kg) 83.643 kg 83.734 kg 83.643 kg      Telemetry    Atrial sensed  (sinus), ventricular paced rhythm; at times AV sequential pacing - Personally Reviewed  ECG    Atrial sensed (sinus), ventricular paced rhythm, prominent positive R wave in lead V1- Personally Reviewed  Physical Exam  Appears comfortable GEN: No acute distress.   Neck: No JVD Cardiac: RRR, late peaking systolic ejection murmur heard at the right upper sternal border in the left lower sternal border no diastolic murmurs, rubs, or gallops.  Respiratory: Clear to auscultation bilaterally. GI: Soft, nontender, non-distended  MS: No edema right ankle, trivial edema left ankle where there are some scars of previous injury; No deformity. Neuro:  Nonfocal  Psych: Normal affect   Labs    High Sensitivity Troponin:  No results for input(s): TROPONINIHS in the last 720 hours.    Chemistry Recent Labs  Lab 11/21/2019 0626 11/25/19 0320 11/29/19 0842  NA 141 139 138  K 4.5 4.9 5.7*  CL 104 104 104  CO2 27 26 26   GLUCOSE 108* 139* 117*  BUN 51* 57* 57*  CREATININE 1.93* 1.92* 2.09*  CALCIUM 8.8* 8.5* 8.6*  PROT 6.1* 5.3*  --   ALBUMIN 2.8* 2.5*  --   AST 56* 35  --  ALT 86* 64*  --   ALKPHOS 94 73  --   BILITOT 1.1 0.8  --   GFRNONAA 35* 35* 32*  GFRAA 41* 41* 37*  ANIONGAP 10 9 8      Hematology Recent Labs  Lab 11/10/2019 0626 11/27/19 0612  WBC 11.1* 10.0  RBC 3.24* 2.94*  HGB 9.5* 8.6*  HCT 31.4* 28.6*  MCV 96.9 97.3  MCH 29.3 29.3  MCHC 30.3 30.1  RDW 19.2* 19.3*  PLT 206 171    BNPNo results for input(s): BNP, PROBNP in the last 168 hours.   DDimer No results for input(s): DDIMER in the last 168 hours.   Radiology    No results found.  Cardiac Studies   09/17/2019 transthoracic echocardiogram: 1. Left ventricular ejection fraction, by estimation, is 40 to 45%. The  left ventricle has mildly decreased function. The left ventricle  demonstrates global hypokinesis. The left ventricular internal cavity size  was mildly dilated. Left ventricular  diastolic  parameters are consistent with Grade II diastolic dysfunction  (pseudonormalization). Elevated left atrial pressure.  2. Right ventricular systolic function is mildly reduced. The right  ventricular size is mildly enlarged. There is moderately elevated  pulmonary artery systolic pressure.  3. Left atrial size was mildly dilated.  4. Right atrial size was mildly dilated.  5. The mitral valve is normal in structure. Mild mitral valve  regurgitation.  6. The aortic valve is tricuspid. Aortic valve regurgitation is mild.  Severe aortic valve stenosis.  7. The inferior vena cava is dilated in size with <50% respiratory  variability, suggesting right atrial pressure of 15 mmHg.   Patient Profile     69 y.o. male with history of chronic heart failure with moderate left ventricular systolic dysfunction (EF 01%), CAD (PCI-RCA 07/2019), severe aortic stenosis, history of recurrent persistent atrial fibrillation and atrial flutter with tachycardia-bradycardia syndrome, s/p CRT-P (Medtronic, [place], April 2021), COPD, hypertension, type II DM, CKD 3b, admitted with acute exacerbation of heart failure scheduled for TAVR tomorrow 11/09/2019  Assessment & Plan    1.CHF: Appears euvolemic.  I am not sure that the cardiomyopathy is secondary to the aortic stenosis and EF may not improve postoperatively. 2. Low gradient severe AS: For TAVR tomorrow 3. CAD: Currently angina free.  On beta-blocker, statin, clopidogrel. PCI-RCA 07/2019. 4. PAFib: Currently maintaining normal rhythm on amiodarone.  Warfarin anticoagulation currently on hold for TAVR tomorrow. 5. CRT-P: Normal device function.  Had some diaphragmatic stimulation, resolved after switching LV pacing vector. 6. CKD 3b: very slight bump in creatinine today, remains at baseline around 2.     For questions or updates, please contact [place] Please consult [email or web address] for contact info under        Signed, Sanda Klein, MD   11/29/2019, 10:35 AM

## 2019-11-29 NOTE — Progress Notes (Signed)
PROGRESS NOTE  Justin Boone  DOB: April 14, 1951  PCP: Verdell Carmine., MD ZOX:[id number]  DOA: 11/16/2019  LOS: 9 days   Brief narrative: Patient is a 69 year old male with history of A. fib on Coumadin, CAD, bradycardia sp Medtronic pacer, DM 2, hypertension, severe AS, COPD, hypertension, chronic kidney disease stage IIIb with creatinine 1.9-2.3. Patient was hospitalized (6/7-6/12) at an outside hospital ([place]) for decompensated heart failure and COPD exacerbation.  He was treated with IV diuretics, IV Rocephin and steroids.  His renal function worsened from creatinine of 1.7 on 6/7 to 2.1 on 6/10.  6/7- CT chest showed right greater than left pleural effusion from apex to base with compressive volume loss of right middle lobe.   6/8 - He underwent thoracentesis with 1.1 L removed. 6/10 - Echocardiogram showed EF of 50 to 55%, abnormal left ventricular diastolic filling, multiple regional wall motion abnormalities, RVSP estimated to be 55 to 60 mmHg, moderate AAS, moderate to severe MR 6/12-patient was transferred from outside hospital to [facility] primarily for cardiology and nephrology evaluation.  Subjective: Patient was seen and examined this morning. Not in distress.  Waiting for surgery tomorrow. Holding pain controlled on as needed Norco.  Assessment/Plan: Acute on chronic diastolic CHF Essential hypertension -Diuresis done with IV Lasix in outside hospital. -With rising creatinine, Lasix is currently on hold.  -Continue to monitor blood pressure, renal function and electrolytes.  Symptomatic severe aortic stenosis -structural heart team consulted, patient was seen by Dr. Burt Knack.  -Plans are in place for TAVR tomorrow. -As preop work-up, patient underwent upper teeth extraction on 6/16.   -Continue pain control with Vicodin as needed.   COPD exacerbation -managed at outside hospital with oral steroids, nebulizer.   -Continue oral steroid taper off.   Currently on prednisone 10 mg daily.  I will stop it today.  Steroid may not be helpful wound healing postsurgical. -Continue to monitor breathing status.   CAD s/p PCI -no chest pain. -Continue metoprolol, atorvastatin, Plavix.  History of A. fib/flutter, PPM in place -continue amiodarone.  Coumadin is currently on hold for TAVR next week.   Chronic kidney disease stage IIIb- -Baseline creatinine 1.9-2.3.  Creatinine today is 2.09.   Type 2 diabetes mellitus -Last A1c 5.3, not on medications  BPH -Flomax.   Tobacco use -Smokes a pack a day.  Counseled for cessation   Mobility: Encourage ambulation Code Status:  Full code DVT prophylaxis: SCDs Start: 11/25/2019 1433  Antimicrobials: Not on scheduled antibiotics. Fluid: None Diet: Cardiac/diabetic diet  Consultants: Cardiology, cardiac surgery, dentistry Family Communication:  Not at bedside  Status is: Inpatient  Remains inpatient appropriate because: Waiting for TAVR tomorrow.  Dispo: The patient is from: Home              Anticipated d/c is to: Home              Anticipated d/c date is: > 3 days              Patient currently is not medically stable to d/c.  Antimicrobials: Anti-infectives (From admission, onward)   Start     Dose/Rate Route Frequency Ordered Stop   11/19/2019 0400  vancomycin (VANCOREADY) IVPB 1250 mg/250 mL     Discontinue     1,250 mg 166.7 mL/hr over 90 Minutes Intravenous To Surgery 11/29/19 0710 12/01/19 0400   11/23/2019 0400  cefUROXime (ZINACEF) 1.5 g in sodium chloride 0.9 % 100 mL IVPB     Discontinue  1.5 g 200 mL/hr over 30 Minutes Intravenous To Surgery 11/29/19 0710 12/01/19 0400   11/11/2019 1145  ceFAZolin (ANCEF) IVPB 2g/100 mL premix        2 g 200 mL/hr over 30 Minutes Intravenous To Short Stay 11/23/19 0940 11/13/2019 1245        Code Status: Full Code   Diet Order            DIET - DYS 1 Room service appropriate? Yes; Fluid consistency: Thin  Diet effective now                   Infusions:  . [START ON 12/07/2019] cefUROXime (ZINACEF)  IV    . [START ON 12/01/2019] dexmedetomidine    . [START ON 12/07/2019] heparin 30,000 units/NS 1000 mL solution for CELLSAVER    . [START ON 11/26/2019] norepinephrine (LEVOPHED) Adult infusion    . sodium chloride irrigation    . [START ON 12/05/2019] vancomycin      Scheduled Meds: . amiodarone  400 mg Oral Daily  . atorvastatin  80 mg Oral q1800  . clopidogrel  75 mg Oral Daily  . DULoxetine  60 mg Oral Daily  . [START ON 12/08/2019] magnesium sulfate  40 mEq Other To OR  . metoprolol succinate  50 mg Oral Daily  . mometasone-formoterol  2 puff Inhalation BID  . multivitamin with minerals  1 tablet Oral Daily  . nicotine  21 mg Transdermal Daily  . [START ON 12/06/2019] potassium chloride  80 mEq Other To OR  . predniSONE  10 mg Oral Q breakfast  . sodium chloride flush  3 mL Intravenous Q12H  . tamsulosin  0.4 mg Oral QPC breakfast    PRN meds: HYDROcodone-acetaminophen, melatonin   Objective: Vitals:   11/29/19 0405 11/29/19 0807  BP: 131/70   Pulse: 62 74  Resp: 15 19  Temp: 97.7 F (36.5 C)   SpO2: 96% 94%    Intake/Output Summary (Last 24 hours) at 11/29/2019 1044 Last data filed at 11/29/2019 0900 Gross per 24 hour  Intake 1080 ml  Output 600 ml  Net 480 ml   Filed Weights   11/27/19 0617 11/28/19 0340 11/29/19 0405  Weight: 83.6 kg 83.7 kg 83.6 kg   Weight change: -0.091 kg Body mass index is 29.76 kg/m.   Physical Exam: General exam: Appears calm and comfortable.  Not in physical distress Skin: No rashes, lesions or ulcers. HEENT: Atraumatic, normocephalic, supple neck, no obvious bleeding Lungs: Clear to auscultation bilaterally.  No crackles.  No wheezing. CVS: Regular rate and rhythm, ejection systolic murmur present. GI/Abd soft, nontender, nondistended, bowel sound present. CNS: Alert, awake, confused, slow to respond Psychiatry: Mood appropriate Extremities: no calf  tenderness  Data Review: I have personally reviewed the laboratory data and studies available.  Recent Labs  Lab 11/13/2019 0626 11/27/19 0612  WBC 11.1* 10.0  HGB 9.5* 8.6*  HCT 31.4* 28.6*  MCV 96.9 97.3  PLT 206 171   Recent Labs  Lab 12/03/2019 0626 11/25/19 0320 11/29/19 0842  NA 141 139 138  K 4.5 4.9 5.7*  CL 104 104 104  CO2 27 26 26   GLUCOSE 108* 139* 117*  BUN 51* 57* 57*  CREATININE 1.93* 1.92* 2.09*  CALCIUM 8.8* 8.5* 8.6*   Signed, Terrilee Croak, MD [facility] Pager: [phone] (Secure Chat preferred). 11/29/2019

## 2019-11-30 ENCOUNTER — Encounter (HOSPITAL_COMMUNITY)
Admission: AD | Disposition: E | Payer: Medicare HMO | Source: Other Acute Inpatient Hospital | Attending: Cardiovascular Disease

## 2019-11-30 ENCOUNTER — Inpatient Hospital Stay (HOSPITAL_COMMUNITY): Payer: Medicare HMO

## 2019-11-30 ENCOUNTER — Encounter (HOSPITAL_COMMUNITY): Payer: Self-pay | Admitting: Internal Medicine

## 2019-11-30 ENCOUNTER — Inpatient Hospital Stay (HOSPITAL_COMMUNITY): Payer: Medicare HMO | Admitting: Certified Registered Nurse Anesthetist

## 2019-11-30 ENCOUNTER — Other Ambulatory Visit: Payer: Self-pay | Admitting: Physician Assistant

## 2019-11-30 DIAGNOSIS — Z006 Encounter for examination for normal comparison and control in clinical research program: Secondary | ICD-10-CM

## 2019-11-30 DIAGNOSIS — Z952 Presence of prosthetic heart valve: Secondary | ICD-10-CM

## 2019-11-30 DIAGNOSIS — I350 Nonrheumatic aortic (valve) stenosis: Secondary | ICD-10-CM

## 2019-11-30 HISTORY — PX: TRANSCATHETER AORTIC VALVE REPLACEMENT, TRANSFEMORAL: SHX6400

## 2019-11-30 HISTORY — PX: TEE WITHOUT CARDIOVERSION: SHX5443

## 2019-11-30 HISTORY — DX: Presence of prosthetic heart valve: Z95.2

## 2019-11-30 LAB — POCT I-STAT, CHEM 8
BUN: 56 mg/dL — ABNORMAL HIGH (ref 8–23)
BUN: 60 mg/dL — ABNORMAL HIGH (ref 8–23)
BUN: 60 mg/dL — ABNORMAL HIGH (ref 8–23)
Calcium, Ion: 1.13 mmol/L — ABNORMAL LOW (ref 1.15–1.40)
Calcium, Ion: 1.13 mmol/L — ABNORMAL LOW (ref 1.15–1.40)
Calcium, Ion: 1.15 mmol/L (ref 1.15–1.40)
Chloride: 103 mmol/L (ref 98–111)
Chloride: 102 mmol/L (ref 98–111)
Chloride: 104 mmol/L (ref 98–111)
Creatinine, Ser: 2.2 mg/dL — ABNORMAL HIGH (ref 0.61–1.24)
Creatinine, Ser: 2.1 mg/dL — ABNORMAL HIGH (ref 0.61–1.24)
Creatinine, Ser: 2.1 mg/dL — ABNORMAL HIGH (ref 0.61–1.24)
Glucose, Bld: 128 mg/dL — ABNORMAL HIGH (ref 70–99)
Glucose, Bld: 126 mg/dL — ABNORMAL HIGH (ref 70–99)
Glucose, Bld: 123 mg/dL — ABNORMAL HIGH (ref 70–99)
HCT: 28 % — ABNORMAL LOW (ref 39.0–52.0)
HCT: 25 % — ABNORMAL LOW (ref 39.0–52.0)
HCT: 25 % — ABNORMAL LOW (ref 39.0–52.0)
Hemoglobin: 9.5 g/dL — ABNORMAL LOW (ref 13.0–17.0)
Hemoglobin: 8.5 g/dL — ABNORMAL LOW (ref 13.0–17.0)
Hemoglobin: 8.5 g/dL — ABNORMAL LOW (ref 13.0–17.0)
Potassium: 5 mmol/L (ref 3.5–5.1)
Potassium: 5 mmol/L (ref 3.5–5.1)
Potassium: 5 mmol/L (ref 3.5–5.1)
Sodium: 139 mmol/L (ref 135–145)
Sodium: 139 mmol/L (ref 135–145)
Sodium: 139 mmol/L (ref 135–145)
TCO2: 24 mmol/L (ref 22–32)
TCO2: 26 mmol/L (ref 22–32)
TCO2: 24 mmol/L (ref 22–32)

## 2019-11-30 LAB — BASIC METABOLIC PANEL
Anion gap: 13 (ref 5–15)
BUN: 59 mg/dL — ABNORMAL HIGH (ref 8–23)
CO2: 22 mmol/L (ref 22–32)
Calcium: 8.7 mg/dL — ABNORMAL LOW (ref 8.9–10.3)
Chloride: 104 mmol/L (ref 98–111)
Creatinine, Ser: 2.1 mg/dL — ABNORMAL HIGH (ref 0.61–1.24)
GFR calc Af Amer: 37 mL/min — ABNORMAL LOW (ref >60)
GFR calc non Af Amer: 32 mL/min — ABNORMAL LOW (ref >60)
Glucose, Bld: 129 mg/dL — ABNORMAL HIGH (ref 70–99)
Potassium: 5.9 mmol/L — ABNORMAL HIGH (ref 3.5–5.1)
Sodium: 139 mmol/L (ref 135–145)

## 2019-11-30 LAB — PROTIME-INR
INR: 1.4 — ABNORMAL HIGH (ref 0.8–1.2)
Prothrombin Time: 16.7 seconds — ABNORMAL HIGH (ref 11.4–15.2)

## 2019-11-30 LAB — CBC
HCT: 30.3 % — ABNORMAL LOW (ref 39.0–52.0)
Hemoglobin: 9.2 g/dL — ABNORMAL LOW (ref 13.0–17.0)
MCH: 29.7 pg (ref 26.0–34.0)
MCHC: 30.4 g/dL (ref 30.0–36.0)
MCV: 97.7 fL (ref 80.0–100.0)
Platelets: 168 10*3/uL (ref 150–400)
RBC: 3.1 MIL/uL — ABNORMAL LOW (ref 4.22–5.81)
RDW: 19.5 % — ABNORMAL HIGH (ref 11.5–15.5)
WBC: 11.1 10*3/uL — ABNORMAL HIGH (ref 4.0–10.5)
nRBC: 0.3 % — ABNORMAL HIGH (ref 0.0–0.2)

## 2019-11-30 LAB — BLOOD GAS, ARTERIAL
Acid-Base Excess: 1 mmol/L (ref 0.0–2.0)
Bicarbonate: 24.8 mmol/L (ref 20.0–28.0)
FIO2: 21
O2 Saturation: 96 %
Patient temperature: 37
pCO2 arterial: 37.3 mmHg (ref 32.0–48.0)
pH, Arterial: 7.438 (ref 7.350–7.450)
pO2, Arterial: 79.1 mmHg — ABNORMAL LOW (ref 83.0–108.0)

## 2019-11-30 LAB — POTASSIUM
Potassium: 5.4 mmol/L — ABNORMAL HIGH (ref 3.5–5.1)
Potassium: 5.7 mmol/L — ABNORMAL HIGH (ref 3.5–5.1)

## 2019-11-30 LAB — GLUCOSE, CAPILLARY
Glucose-Capillary: 129 mg/dL — ABNORMAL HIGH (ref 70–99)
Glucose-Capillary: 142 mg/dL — ABNORMAL HIGH (ref 70–99)

## 2019-11-30 LAB — APTT: aPTT: 35 seconds (ref 24–36)

## 2019-11-30 SURGERY — IMPLANTATION, AORTIC VALVE, TRANSCATHETER, FEMORAL APPROACH
Anesthesia: Monitor Anesthesia Care | Site: Chest

## 2019-11-30 MED ORDER — MORPHINE SULFATE (PF) 2 MG/ML IV SOLN: 1.0000 mg | INTRAVENOUS | Status: DC | PRN

## 2019-11-30 MED ORDER — LACTATED RINGERS IV SOLN: INTRAVENOUS | Status: DC

## 2019-11-30 MED ORDER — SODIUM CHLORIDE 0.9% FLUSH: 3.0000 mL | INTRAVENOUS | Status: DC | PRN

## 2019-11-30 MED ORDER — CHLORHEXIDINE GLUCONATE 0.12 % MT SOLN
OROMUCOSAL | Status: AC
  Filled 2019-11-30: qty 15

## 2019-11-30 MED ORDER — OXYCODONE HCL 5 MG PO TABS
5.0000 mg | ORAL_TABLET | ORAL | Status: DC | PRN
  Administered 2019-11-30: 5 mg via ORAL
  Administered 2019-12-01 – 2019-12-02 (×5): 10 mg via ORAL
  Filled 2019-11-30 (×4): qty 2
  Filled 2019-11-30: qty 1
  Filled 2019-11-30: qty 2

## 2019-11-30 MED ORDER — LIDOCAINE HCL (PF) 1 % IJ SOLN
INTRAMUSCULAR | Status: DC | PRN
  Administered 2019-11-30: 10 mL via INTRADERMAL

## 2019-11-30 MED ORDER — ALBUTEROL SULFATE (2.5 MG/3ML) 0.083% IN NEBU
INHALATION_SOLUTION | RESPIRATORY_TRACT | Status: AC
  Administered 2019-11-30: 2.5 mg via RESPIRATORY_TRACT
  Filled 2019-11-30: qty 3

## 2019-11-30 MED ORDER — ACETAMINOPHEN 325 MG PO TABS
650.0000 mg | ORAL_TABLET | Freq: Four times a day (QID) | ORAL | Status: DC | PRN
  Administered 2019-11-30 – 2019-12-04 (×6): 650 mg via ORAL
  Filled 2019-11-30 (×6): qty 2

## 2019-11-30 MED ORDER — NITROGLYCERIN IN D5W 200-5 MCG/ML-% IV SOLN: 0.0000 ug/min | INTRAVENOUS | Status: DC

## 2019-11-30 MED ORDER — VANCOMYCIN HCL IN DEXTROSE 1-5 GM/200ML-% IV SOLN
1000.0000 mg | Freq: Once | INTRAVENOUS | Status: AC
  Administered 2019-12-01: 1000 mg via INTRAVENOUS
  Filled 2019-11-30: qty 200

## 2019-11-30 MED ORDER — SODIUM CHLORIDE 0.9 % IV SOLN: INTRAVENOUS | Status: AC

## 2019-11-30 MED ORDER — FENTANYL CITRATE (PF) 100 MCG/2ML IJ SOLN
INTRAMUSCULAR | Status: DC | PRN
  Administered 2019-11-30: 50 ug via INTRAVENOUS

## 2019-11-30 MED ORDER — SODIUM CHLORIDE 0.9% FLUSH
3.0000 mL | Freq: Two times a day (BID) | INTRAVENOUS | Status: DC
  Administered 2019-12-01 – 2019-12-13 (×16): 3 mL via INTRAVENOUS

## 2019-11-30 MED ORDER — MIDAZOLAM HCL 2 MG/2ML IJ SOLN
INTRAMUSCULAR | Status: DC | PRN
  Administered 2019-11-30: 1 mg via INTRAVENOUS

## 2019-11-30 MED ORDER — SODIUM CHLORIDE 0.9 % IV SOLN
1.5000 g | Freq: Two times a day (BID) | INTRAVENOUS | Status: AC
  Administered 2019-11-30 – 2019-12-02 (×4): 1.5 g via INTRAVENOUS
  Filled 2019-11-30 (×4): qty 1.5

## 2019-11-30 MED ORDER — ORAL CARE MOUTH RINSE
15.0000 mL | Freq: Two times a day (BID) | OROMUCOSAL | Status: DC
  Administered 2019-12-01 – 2019-12-13 (×21): 15 mL via OROMUCOSAL

## 2019-11-30 MED ORDER — HEPARIN (PORCINE) IN NACL 1000-0.9 UT/500ML-% IV SOLN
INTRAVENOUS | Status: DC | PRN
  Administered 2019-11-30 (×3): 500 mL

## 2019-11-30 MED ORDER — ONDANSETRON HCL 4 MG/2ML IJ SOLN
INTRAMUSCULAR | Status: DC | PRN
  Administered 2019-11-30: 4 mg via INTRAVENOUS

## 2019-11-30 MED ORDER — ACETAMINOPHEN 650 MG RE SUPP: 650.0000 mg | Freq: Four times a day (QID) | RECTAL | Status: DC | PRN

## 2019-11-30 MED ORDER — SODIUM CHLORIDE 0.9 % IV SOLN: INTRAVENOUS | Status: DC

## 2019-11-30 MED ORDER — PROTAMINE SULFATE 10 MG/ML IV SOLN
INTRAVENOUS | Status: DC | PRN
  Administered 2019-11-30: 10 mg via INTRAVENOUS
  Administered 2019-11-30: 110 mg via INTRAVENOUS

## 2019-11-30 MED ORDER — IOHEXOL 350 MG/ML SOLN
INTRAVENOUS | Status: DC | PRN
  Administered 2019-11-30: 20 mL

## 2019-11-30 MED ORDER — ALBUTEROL SULFATE (2.5 MG/3ML) 0.083% IN NEBU
2.5000 mg | INHALATION_SOLUTION | Freq: Once | RESPIRATORY_TRACT | Status: AC
  Filled 2019-11-30: qty 3

## 2019-11-30 MED ORDER — HEPARIN SODIUM (PORCINE) 1000 UNIT/ML IJ SOLN
INTRAMUSCULAR | Status: DC | PRN
Start: 2019-11-30 — End: 2019-11-30
  Administered 2019-11-30: 12000 [IU] via INTRAVENOUS

## 2019-11-30 MED ORDER — PROPOFOL 10 MG/ML IV BOLUS
INTRAVENOUS | Status: DC | PRN
  Administered 2019-11-30: 15 mg via INTRAVENOUS

## 2019-11-30 MED ORDER — SODIUM CHLORIDE 0.9 % IV SOLN: 250.0000 mL | INTRAVENOUS | Status: DC | PRN

## 2019-11-30 MED ORDER — PROPOFOL 500 MG/50ML IV EMUL
INTRAVENOUS | Status: DC | PRN
  Administered 2019-11-30: 20 ug/kg/min via INTRAVENOUS

## 2019-11-30 MED ORDER — FUROSEMIDE 10 MG/ML IJ SOLN
40.0000 mg | Freq: Once | INTRAMUSCULAR | Status: AC
  Administered 2019-11-30: 40 mg via INTRAVENOUS
  Filled 2019-11-30: qty 4

## 2019-11-30 SURGICAL SUPPLY — 33 items
BAG SNAP BAND KOVER 36X36 (MISCELLANEOUS) ×8 IMPLANT
BLANKET WARM UNDERBOD FULL ACC (MISCELLANEOUS) ×4 IMPLANT
CABLE ADAPT PACING TEMP 12FT (ADAPTER) ×4 IMPLANT
CATH 29 EDWARDS DELIVERY SYS (CATHETERS) ×4 IMPLANT
CATH DIAG 6FR PIGTAIL ANGLED (CATHETERS) ×8 IMPLANT
CATH INFINITI 6F AL2 (CATHETERS) ×4 IMPLANT
CATH S G BIP PACING (CATHETERS) ×4 IMPLANT
CLOSURE MYNX CONTROL 6F/7F (Vascular Products) ×4 IMPLANT
CRIMPER (MISCELLANEOUS) ×4 IMPLANT
DEVICE CLOSURE PERCLS PRGLD 6F (VASCULAR PRODUCTS) ×4 IMPLANT
DEVICE INFLATION ATRION QL38 (MISCELLANEOUS) ×4 IMPLANT
ELECT DEFIB PAD ADLT CADENCE (PAD) ×4 IMPLANT
GUIDEWIRE SAFE TJ AMPLATZ EXST (WIRE) ×4 IMPLANT
KIT HEART LEFT (KITS) ×4 IMPLANT
KIT MICROPUNCTURE NIT STIFF (SHEATH) ×4 IMPLANT
PACK CARDIAC CATHETERIZATION (CUSTOM PROCEDURE TRAY) ×4 IMPLANT
PERCLOSE PROGLIDE 6F (VASCULAR PRODUCTS) ×8
SHEATH 16X36 EDWARDS (SHEATH) ×4 IMPLANT
SHEATH BRITE TIP 7FR 35CM (SHEATH) ×4 IMPLANT
SHEATH PINNACLE 6F 10CM (SHEATH) ×4 IMPLANT
SHEATH PINNACLE 8F 10CM (SHEATH) ×4 IMPLANT
SHEATH PROBE COVER 6X72 (BAG) ×4 IMPLANT
SHIELD RADPAD SCOOP 12X17 (MISCELLANEOUS) ×4 IMPLANT
SLEEVE REPOSITIONING LENGTH 30 (MISCELLANEOUS) ×4 IMPLANT
STOPCOCK MORSE 400PSI 3WAY (MISCELLANEOUS) ×8 IMPLANT
SYR MEDRAD MARK V 150ML (SYRINGE) ×4 IMPLANT
TRANSDUCER W/STOPCOCK (MISCELLANEOUS) ×8 IMPLANT
TUBE CONN 8.8X1320 FR HP M-F (CONNECTOR) ×4 IMPLANT
VALVE HEART TRANSCATH SZ3 29MM (Valve) ×4 IMPLANT
WIRE AMPLATZ SS-J .035X180CM (WIRE) ×4 IMPLANT
WIRE EMERALD 3MM-J .035X150CM (WIRE) ×4 IMPLANT
WIRE EMERALD 3MM-J .035X260CM (WIRE) ×4 IMPLANT
WIRE EMERALD ST .035X260CM (WIRE) ×4 IMPLANT

## 2019-11-30 NOTE — Transfer of Care (Signed)
Immediate Anesthesia Transfer of Care Note  Patient: Justin Boone  Procedure(s) Performed: TRANSCATHETER AORTIC VALVE REPLACEMENT, TRANSFEMORAL (N/A Chest) TRANSESOPHAGEAL ECHOCARDIOGRAM (TEE) (N/A )  Patient Location: PACU and Cath Lab  Anesthesia Type:MAC  Level of Consciousness: sedated, patient cooperative and responds to stimulation  Airway & Oxygen Therapy: Patient Spontanous Breathing and Patient connected to face mask oxygen  Post-op Assessment: Report given to RN and Post -op Vital signs reviewed and stable  Post vital signs: Reviewed and stable  Last Vitals:  Vitals Value Taken Time  BP    Temp    Pulse    Resp    SpO2      Last Pain:  Vitals:   11/19/2019 1050  TempSrc:   PainSc: 0-No pain      Patients Stated Pain Goal: 0 (92/92/44 6286)  Complications: No complications documented.

## 2019-11-30 NOTE — Anesthesia Postprocedure Evaluation (Signed)
Anesthesia Post Note  Patient: Scherry Ran  Procedure(s) Performed: TRANSCATHETER AORTIC VALVE REPLACEMENT, TRANSFEMORAL (N/A Chest) TRANSESOPHAGEAL ECHOCARDIOGRAM (TEE) (N/A )     Patient location during evaluation: PACU Anesthesia Type: MAC Level of consciousness: awake and alert Pain management: pain level controlled Vital Signs Assessment: post-procedure vital signs reviewed and stable Respiratory status: spontaneous breathing and respiratory function stable Cardiovascular status: stable Postop Assessment: no apparent nausea or vomiting Anesthetic complications: no   No complications documented.  Last Vitals:  Vitals:   11/28/2019 1824 12/08/2019 1925  BP: 120/65 130/77  Pulse: 61 61  Resp: 14 15  Temp: 36.6 C   SpO2:  98%    Last Pain:  Vitals:   12/04/2019 1925  TempSrc:   PainSc: 0-No pain                 Kyah Buesing DANIEL

## 2019-11-30 NOTE — Progress Notes (Signed)
Nutrition Follow-up  DOCUMENTATION CODES:   Not applicable  INTERVENTION:   -Once diet is advanced, continue:  -MVI with minerals daily -Magic cup TID with meals, each supplement provides 290 kcal and 9 grams of protein  NUTRITION DIAGNOSIS:   Increased nutrient needs related to post-op healing as evidenced by estimated needs.  Ongoing  GOAL:   Patient will meet greater than or equal to 90% of their needs  Progressing   MONITOR:   PO intake, Supplement acceptance, Diet advancement, Labs, Weight trends, Skin, I & O's  REASON FOR ASSESSMENT:   Consult Assessment of nutrition requirement/status  ASSESSMENT:   Justin Boone is a 69 y.o. male with medical history significant of atrial fibrillation on coumadin, bradycardia s/p medtronic CRT-P, CHF, type 2 diabetes, hypertension, severe AS, chronic kidney disease stage III, gout, COPD, hyperlipidemia, CAD s/p PCI, tobacco abuse initially presented on 11/15/2019 with worsening dyspnea.  6/17- s/p OPERATIONS: 1. Multiple extraction of tooth numbers11, 13, and 14 2.Onequadrant of alveoloplasty 3. Gross debridement of remaining dentition  Reviewed I/O's: +105 ml x 24 hours and +874 ml since admission  UOP: 475 ml x 24 hours  Attempted to speak with pt via phone, however, unable to reach.   Pt currently NPO; plan for TAVR today.   Pt with variable appetite, but improving. Meal completion 10-75% (improved to 50-75% over the past 48 hours). Pt was on a dysphagia 1 diet secondary to multiple dental extractions.   Medications reviewed and include prednisone.   Labs reviewed: K: 5.9.   Diet Order:   Diet Order            Diet NPO time specified  Diet effective midnight                 EDUCATION NEEDS:   Education needs have been addressed  Skin:  Skin Assessment: Skin Integrity Issues: Skin Integrity Issues:: Other (Comment), Incisions Incisions: lip Other: lt knee laceration  Last BM:  11/29/19  Height:    Ht Readings from Last 1 Encounters:  12/06/2019 5\' 6"  (1.676 m)    Weight:   Wt Readings from Last 1 Encounters:  11/22/2019 82.9 kg    Ideal Body Weight:  64.5 kg  BMI:  Body mass index is 29.5 kg/m.  Estimated Nutritional Needs:   Kcal:  2050-2250  Protein:  105-120 grams  Fluid:  > 2 L    Loistine Chance, RD, LDN, [place] Registered Dietitian II Certified Diabetes Care and Education Specialist Please refer to [facility] for RD and/or RD on-call/weekend/after hours pager

## 2019-11-30 NOTE — Anesthesia Procedure Notes (Signed)
Arterial Line Insertion Start/End06/30/2021 11:40 AM, 11/25/2019 11:55 AM Performed by: Josephine Igo, CRNA, CRNA  Patient location: Pre-op. Preanesthetic checklist: patient identified, IV checked, risks and benefits discussed and monitors and equipment checked Lidocaine 1% used for infiltration Right, radial was placed Catheter size: 20 G Hand hygiene performed , maximum sterile barriers used  and Seldinger technique used  Attempts: 1 Procedure performed without using ultrasound guided technique. Following insertion, dressing applied and Biopatch. Post procedure assessment: normal  Patient tolerated the procedure well with no immediate complications.

## 2019-11-30 NOTE — Progress Notes (Signed)
Patient has home CPAP equipment set up at bedside. Patient not ready for CPAP at this time. Patient able to place self on/off as needed. RN at bedside offering to help if needed as well. Patient aware to call for assistance if needed.

## 2019-11-30 NOTE — Progress Notes (Signed)
  Echocardiogram 2D Echocardiogram has been performed.  Justin Boone 12/04/2019, 5:02 PM

## 2019-11-30 NOTE — Op Note (Signed)
[facility]   MULTIDISCIPLINARY HEART VALVE TEAM   TAVR OPERATIVE NOTE   Date of Procedure:  11/11/2019  Preoperative Diagnosis: Severe Aortic Stenosis   Postoperative Diagnosis: Same   Procedure:    Transcatheter Aortic Valve Replacement - Percutaneous Left Transfemoral Approach  Edwards Sapien 3 THV (size 29 mm, model # 9600TFX, serial # [id number])   Co-Surgeons:  Valentina Gu. Roxy Manns, MD and Sherren Mocha, MD  Anesthesiologist:  Nelda Severe. Tobias Alexander, MD  Echocardiographer:  Ena Dawley, MD  Pre-operative Echo Findings:  Severe low flow, low gradient aortic stenosis  Moderate mitral regurgitation  Severe left ventricular systolic dysfunction  Post-operative Echo Findings:  Mild paravalvular leak  Unchanged left ventricular systolic function   BRIEF CLINICAL NOTE AND INDICATIONS FOR SURGERY  Patient has stage D2 symptomatic low-flow low gradient aortic stenosis with reduced left ventricular systolic function.  He has a long history of symptoms of shortness of breath that are undoubtedly multifactorial and related to a combination of chronic combined systolic and diastolic congestive heart failure as well as COPD.  He was hospitalized with acute exacerbation of chronic congestive heart failure with symptoms at rest and bilateral pleural effusions, New York Heart Association functional class IV.  He has been doing better on medical therapy following IV diuresis and therapeutic thoracentesis, currently functional class IIIb.  Patient also has history of coronary artery disease and underwent recent PCI and stenting of the right coronary artery in April 2021.  He does not currently describe symptoms of chest pain or chest tightness suspicious for angina pectoris.  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  During the course of the patient's preoperative work up they have been evaluated comprehensively by a multidisciplinary team of specialists coordinated through the [facility] in the [facility].  They have been demonstrated to suffer from symptomatic severe aortic stenosis as noted above. The patient has been counseled extensively as to the relative risks and benefits of all options for the treatment of severe aortic stenosis including long term medical therapy, conventional surgery for aortic valve replacement, and transcatheter aortic valve replacement.  All questions have been answered, and the patient provides full informed consent for the operation as described.   DETAILS OF THE OPERATIVE PROCEDURE  PREPARATION:    The patient is brought to the operating room on the above mentioned date and appropriate monitoring was established by the anesthesia team. The patient is placed in the supine position on the operating table.  Intravenous antibiotics are administered. The patient is monitored closely throughout the procedure under conscious sedation.  Baseline transthoracic echocardiogram was performed. The patient's chest, abdomen, both groins, and both lower extremities are prepared and draped in a sterile manner. A time out procedure is performed.   PERIPHERAL ACCESS:    Using the modified Seldinger technique, femoral arterial and venous access was obtained with placement of 6 Fr sheaths on the right side.  A pigtail diagnostic catheter was passed through the right arterial sheath under fluoroscopic guidance into the aortic root.  A temporary transvenous pacemaker catheter was passed through the right femoral venous sheath under fluoroscopic guidance into the right ventricle.  The pacemaker was tested to ensure stable lead placement and pacemaker capture. Aortic root angiography was performed in order to determine the optimal  angiographic angle for valve deployment.   TRANSFEMORAL ACCESS:  Percutaneous transfemoral access and sheath placement was performed using ultrasound guidance.  The left common femoral artery was cannulated using a micropuncture needle and appropriate location was verified using hand injection angiogram.  A pair of Abbott Perclose percutaneous closure devices were placed and a 6 French sheath replaced into the femoral artery.  The patient was heparinized systemically and ACT verified > 250 seconds.    A 16 Fr transfemoral E-sheath was introduced into the left common femoral artery after progressively dilating over an Amplatz superstiff wire. An AL-2 catheter was used to direct a straight-tip exchange length wire across the native aortic valve into the left ventricle. This was exchanged out for a pigtail catheter and position was confirmed in the LV apex. Simultaneous LV and Ao pressures were recorded.  The pigtail catheter was exchanged for an Amplatz Extra-stiff wire in the LV apex.  Echocardiography was utilized to confirm appropriate wire position and no sign of entanglement in the mitral subvalvular apparatus.   TRANSCATHETER HEART VALVE DEPLOYMENT:   An Edwards Sapien 3 transcatheter heart valve (size 29 mm, model #9600TFX, serial #[id number]) was prepared and crimped per manufacturer's guidelines, and the proper orientation of the valve is confirmed on the [employer] delivery system. The valve was advanced through the introducer sheath using normal technique until in an appropriate position in the abdominal aorta beyond the sheath tip. The balloon was then retracted and using the fine-tuning wheel was centered on the valve. The valve was then advanced across the aortic arch using appropriate flexion of the catheter. The valve was carefully positioned across the aortic valve annulus. The Commander catheter was retracted using normal technique. Once final position of the valve has been confirmed  by angiographic assessment, the valve is deployed while temporarily holding ventilation and during rapid ventricular pacing to maintain systolic blood pressure < 50 mmHg and pulse pressure < 10 mmHg. The balloon inflation is held for >3 seconds after reaching full deployment volume. Once the balloon has fully deflated the balloon is retracted into the ascending aorta and valve function is assessed using echocardiography. There is felt to be mild paravalvular leak and no central aortic insufficiency.  The patient's hemodynamic recovery following valve deployment is good.  The deployment balloon and guidewire are both removed.    PROCEDURE COMPLETION:   The sheath was removed and femoral artery closure performed.  Protamine was administered once femoral arterial repair was complete. The temporary pacemaker, pigtail catheters and femoral sheaths were removed with manual pressure used for hemostasis.  A Mynx femoral closure device was utilized following removal of the diagnostic sheath in the right femoral artery.  The patient tolerated the procedure well and is transported to the surgical intensive care in stable condition. There were no immediate intraoperative complications. All sponge instrument and needle counts are verified correct at completion of the operation.   No blood products were administered during the operation.  The patient received a total of 40 mL of intravenous contrast during the procedure.   Rexene Alberts, MD 12/08/2019 5:16 PM

## 2019-11-30 NOTE — Discharge Instructions (Signed)
MOUTH CARE AFTER SURGERY ° °FACTS: °· Ice used in ice bag helps keep the swelling down, and can help lessen the pain. °· It is easier to treat pain BEFORE it happens. °· Spitting disturbs the clot and may cause bleeding to start again, or to get worse. °· Smoking delays healing and can cause complications. °· Sharing prescriptions can be dangerous.  Do not take medications not recently prescribed for you. °· Antibiotics may stop birth control pills from working.  Use other means of birth control while on antibiotics. °· Warm salt water rinses after the first 24 hours will help lessen the swelling:  Use 1/2 teaspoonful of table salt per oz.of water. ° °DO NOT: °· Do not spit.  Do not drink through a straw. °· Strongly advised not to smoke, dip snuff or chew tobacco at least for 3 days. °· Do not eat sharp or crunchy foods.  Avoid the area of surgery when chewing. °· Do not stop your antibiotics before your instructions say to do so. °· Do not eat hot foods until bleeding has stopped.  If you need to, let your food cool down to room temperature. ° °EXPECT: °· Some swelling, especially first 2-3 days. °· Soreness or discomfort in varying degrees.  Follow your dentist's instructions about how to handle pain before it starts. °· Pinkish saliva or light blood in saliva, or on your pillow in the morning.  This can last around 24 hours. °· Bruising inside or outside the mouth.  This may not show up until 2-3 days after surgery.  Don't worry, it will go away in time. °· Pieces of "bone" may work themselves loose.  It's OK.  If they bother you, let us know. ° °WHAT TO DO IMMEDIATELY AFTER SURGERY: °· Bite on the gauze with steady pressure for 1-2 hours.  Don't chew on the gauze. °· Do not lie down flat.  Raise your head support especially for the first 24 hours. °· Apply ice to your face on the side of the surgery.  You may apply it 20 minutes on and a few minutes off.  Ice for 8-12 hours.  You may use ice up to 24  hours. °· Before the numbness wears off, take a pain pill as instructed. °· Prescription pain medication is not always required. ° °SWELLING: °· Expect swelling for the first couple of days.  It should get better after that. °· If swelling increases 3 days or so after surgery; let us know as soon as possible. ° °FEVER: °· Take Tylenol every 4 hours if needed to lower your temperature, especially if it is at 100F or higher. °· Drink lots of fluids. °· If the fever does not go away, let us know. ° °BREATHING TROUBLE: °· Any unusual difficulty breathing means you have to have someone bring you to the emergency room ASAP ° °BLEEDING: °· Light oozing is expected for 24 hours or so. °· Prop head up with pillows °· Avoid spitting °· Do not confuse bright red fresh flowing blood with lots of saliva colored with a little bit of blood. °· If you notice some bleeding, place gauze or a tea bag where it is bleeding and apply CONSTANT pressure by biting down for 1 hour.  Avoid talking during this time.  Do not remove the gauze or tea bag during this hour to "check" the bleeding. °· If you notice bright RED bleeding FLOWING out of particular area, and filling the floor of your mouth, put   a wad of gauze on that area, bite down firmly and constantly.  Call us immediately.  If we're closed, have someone bring you to the emergency room.  ORAL HYGIENE:  Brush your teeth as usual after meals and before bedtime.  Use a soft toothbrush around the area of surgery.  DO NOT AVOID BRUSHING.  Otherwise bacteria(germs) will grow and may delay healing or encourage infection.  Since you cannot spit, just gently rinse and let the water flow out of your mouth.  DO NOT SWISH HARD.  EATING:  Cool liquids are a good point to start.  Increase to soft foods as tolerated.  PRESCRIPTIONS:  Follow the directions for your prescriptions exactly as written.  If Dr. Enrique Sack gave you a narcotic pain medication, do not drive, operate  machinery or drink alcohol when on that medication.  QUESTIONS:  Call our office during office hours [phone] or call the Emergency Room at [phone].    _____________________   ACTIVITY AND EXERCISE . Daily activity and exercise are an important part of your recovery. People recover at different rates depending on their general health and type of valve procedure. . Most people recovering from TAVR feel better relatively quickly  . No lifting, pushing, pulling more than 10 pounds (examples to avoid: groceries, vacuuming, gardening, golfing):             - For one week with a procedure through the groin.             - For six weeks for procedures through the chest wall or neck. NOTE: You will typically see one of our providers 7-14 days after your procedure to discuss [place] the above activities.      DRIVING . Do not drive until you are seen for follow up and cleared by a provider. Generally, we ask patient to not drive for 1 week after their procedure. . If you have been told by your doctor in the past that you may not drive, you must talk with him/her before you begin driving again.   DRESSING . Groin site: you may leave the clear dressing over the site for up to one week or until it falls off.   HYGIENE . If you had a femoral (leg) procedure, you may take a shower when you return home. After the shower, pat the site dry. Do NOT use powder, oils or lotions in your groin area until the site has completely healed. . If you had a chest procedure, you may shower when you return home unless specifically instructed not to by your discharging practitioner.             - DO NOT scrub incision; pat dry with a towel.             - DO NOT apply any lotions, oils, powders to the incision.             - No tub baths / swimming for at least 2 weeks. . If you notice any fevers, chills, increased pain, swelling, bleeding or pus, please contact your doctor.   ADDITIONAL  INFORMATION . If you are going to have an upcoming dental procedure, please contact our office as you will require antibiotics ahead of time to prevent infection on your heart valve.    If you have any questions or concerns you can call the structural heart phone during normal business hours 8am-4pm. If you have an urgent need after hours or weekends please call  [phone] to talk to the on call provider for general cardiology. If you have an emergency that requires immediate attention, please call 911.    After TAVR Checklist  Check  Test Description   Follow up appointment in 1-2 weeks  You will see our structural heart physician assistant, Nell Range. Your incision sites will be checked and you will be cleared to drive and resume all normal activities if you are doing well.     1 month echo and follow up  You will have an echo to check on your new heart valve and be seen back in the office by Nell Range. Many times the echo is not read by your appointment time, but Joellen Jersey will call you later that day or the following day to report your results.   Follow up with your primary cardiologist You will need to be seen by your primary cardiologist in the following 3-6 months after your 1 month appointment in the valve clinic. Often times your Plavix or Aspirin will be discontinued during this time, but this is decided on a case by case basis.    1 year echo and follow up You will have another echo to check on your heart valve after 1 year and be seen back in the office by Nell Range. This your last structural heart visit.   Bacterial endocarditis prophylaxis  You will have to take antibiotics for the rest of your life before all dental procedures (even teeth cleanings) to protect your heart valve. Antibiotics are also required before some surgeries. Please check with your cardiologist before scheduling any surgeries. Also, please make sure to tell us if you have a penicillin allergy as you will  require an alternative antibiotic.

## 2019-11-30 NOTE — Anesthesia Procedure Notes (Signed)
Procedure Name: MAC Date/Time: 11/14/2019 3:30 PM Performed by: Janace Litten, CRNA Pre-anesthesia Checklist: Patient identified, Emergency Drugs available, Suction available and Patient being monitored Patient Re-evaluated:Patient Re-evaluated prior to induction Oxygen Delivery Method: Simple face mask

## 2019-11-30 NOTE — Progress Notes (Signed)
ABG sent to lab. Patient has home CPAP unit at bedside set up. Patient able to place self on/off as needed. Patient aware to call for assistance if needed.

## 2019-11-30 NOTE — Anesthesia Preprocedure Evaluation (Signed)
Anesthesia Evaluation  Patient identified by MRN, date of birth, ID band Patient awake    Reviewed: Allergy & Precautions, NPO status , Patient's Chart, lab work & pertinent test results  Airway Mallampati: II  TM Distance: >3 FB     Dental   Pulmonary Current Smoker,    breath sounds clear to auscultation       Cardiovascular hypertension, + CAD  + Valvular Problems/Murmurs  Rhythm:Regular Rate:Normal     Neuro/Psych    GI/Hepatic negative GI ROS, Neg liver ROS,   Endo/Other  diabetes  Renal/GU Renal disease     Musculoskeletal   Abdominal   Peds  Hematology   Anesthesia Other Findings   Reproductive/Obstetrics                             Anesthesia Physical Anesthesia Plan  ASA: III  Anesthesia Plan: MAC   Post-op Pain Management:    Induction: Intravenous  PONV Risk Score and Plan: 1 and Ondansetron and Midazolam  Airway Management Planned: Nasal Cannula and Simple Face Mask  Additional Equipment:   Intra-op Plan:   Post-operative Plan:   Informed Consent: I have reviewed the patients History and Physical, chart, labs and discussed the procedure including the risks, benefits and alternatives for the proposed anesthesia with the patient or authorized representative who has indicated his/her understanding and acceptance.     Dental advisory given  Plan Discussed with: Anesthesiologist and CRNA  Anesthesia Plan Comments:         Anesthesia Quick Evaluation

## 2019-11-30 NOTE — Progress Notes (Signed)
  HEART AND [place] showed a potassium of 5.7 today. Repeat K 5.4. Will give one dose of IV lasix now and repeat at 10:30am. Plan for TAVR at 12 noon today.   Angelena Form PA-C  [facility]

## 2019-11-30 NOTE — Progress Notes (Signed)
PROGRESS NOTE  Justin Boone  DOB: 29-Apr-1951  PCP: Verdell Carmine., MD OXB:[id number]  DOA: 12/02/2019  LOS: 10 days   Brief narrative: Patient is a 69 year old male with history of A. fib on Coumadin, CAD, bradycardia sp Medtronic pacer, DM 2, hypertension, severe AS, COPD, hypertension, chronic kidney disease stage IIIb with creatinine 1.9-2.3. Patient was hospitalized (6/7-6/12) at an outside hospital ([place]) for decompensated heart failure and COPD exacerbation.  He was treated with IV diuretics, IV Rocephin and steroids.  His renal function worsened from creatinine of 1.7 on 6/7 to 2.1 on 6/10.  6/7- CT chest showed right greater than left pleural effusion from apex to base with compressive volume loss of right middle lobe.   6/8 - He underwent thoracentesis with 1.1 L removed. 6/10 - Echocardiogram showed EF of 50 to 55%, abnormal left ventricular diastolic filling, multiple regional wall motion abnormalities, RVSP estimated to be 55 to 60 mmHg, moderate AAS, moderate to severe MR 6/12-patient was transferred from outside hospital to [facility] primarily for cardiology and nephrology evaluation.  Subjective: Patient was seen and examined this morning. Patient was waiting for surgery.  Assessment/Plan: Acute on chronic diastolic CHF Essential hypertension -Diuresis done with IV Lasix in outside hospital. -With rising creatinine, Lasix is currently on hold.  -Blood work yesterday and today showed stable creatinine but potassium is up.  1 dose of Lasix given earlier by cardiology team. -Continue to monitor blood pressure, renal function and electrolytes.  Symptomatic severe aortic stenosis -structural heart team consulted, patient was seen by Dr. Burt Knack.  -Plan for TAVR today versus -As preop work-up, patient underwent upper teeth extraction on 6/16.   -Continue pain control with Vicodin as needed.   COPD exacerbation -managed at outside hospital with oral  steroids, nebulizer.   -Completed prednisone taper off yesterday..  - Steroid may not be helpful wound healing postsurgical. -Continue to monitor breathing status.   CAD s/p PCI -no chest pain. -Continue metoprolol, atorvastatin, Plavix.  History of A. fib/flutter, PPM in place -continue amiodarone.  Coumadin is currently on hold for TAVR next week.   Chronic kidney disease stage IIIb- -Baseline creatinine 1.9-2.3.  Creatinine is currently at baseline.   Type 2 diabetes mellitus -Last A1c 5.3, not on medications  BPH -Flomax.   Tobacco use -Smokes a pack a day.  Counseled for cessation   Mobility: Encourage ambulation Code Status:  Full code DVT prophylaxis: SCDs Start: 11/22/2019 1433  Antimicrobials: Not on scheduled antibiotics. Fluid: None Diet: Cardiac/diabetic diet  Consultants: Cardiology, cardiac surgery, dentistry Family Communication:  Not at bedside  Status is: Inpatient  Remains inpatient appropriate-TAVR today.  Dispo: The patient is from: Home              Anticipated d/c is to: Home              Anticipated d/c date is: > 3 days              Patient currently is not medically stable to d/c.  Antimicrobials: Anti-infectives (From admission, onward)   Start     Dose/Rate Route Frequency Ordered Stop   12/03/2019 0400  vancomycin (VANCOREADY) IVPB 1250 mg/250 mL     Discontinue     1,250 mg 166.7 mL/hr over 90 Minutes Intravenous To Surgery 11/29/19 0710 12/01/19 0400   11/16/2019 0400  cefUROXime (ZINACEF) 1.5 g in sodium chloride 0.9 % 100 mL IVPB     Discontinue     1.5 g 200  mL/hr over 30 Minutes Intravenous To Surgery 11/29/19 0710 12/01/19 0400   11/18/2019 1145  ceFAZolin (ANCEF) IVPB 2g/100 mL premix        2 g 200 mL/hr over 30 Minutes Intravenous To Short Stay 11/23/19 0940 12/01/2019 1245        Code Status: Full Code   Diet Order            Diet NPO time specified  Diet effective midnight                 Infusions:  . sodium  chloride 75 mL/hr at 11/23/2019 0954  . cefUROXime (ZINACEF)  IV    . dexmedetomidine    . heparin 30,000 units/NS 1000 mL solution for CELLSAVER    . lactated ringers 10 mL/hr at 11/28/2019 1210  . norepinephrine (LEVOPHED) Adult infusion    . sodium chloride irrigation    . vancomycin      Scheduled Meds: . [MAR Hold] amiodarone  400 mg Oral Daily  . [MAR Hold] atorvastatin  80 mg Oral q1800  . chlorhexidine      . [MAR Hold] clopidogrel  75 mg Oral Daily  . [MAR Hold] DULoxetine  60 mg Oral Daily  . magnesium sulfate  40 mEq Other To OR  . [MAR Hold] metoprolol succinate  50 mg Oral Daily  . [MAR Hold] mometasone-formoterol  2 puff Inhalation BID  . [MAR Hold] multivitamin with minerals  1 tablet Oral Daily  . [MAR Hold] nicotine  21 mg Transdermal Daily  . [MAR Hold] sodium chloride flush  3 mL Intravenous Q12H  . [MAR Hold] tamsulosin  0.4 mg Oral QPC breakfast    PRN meds: [MAR Hold] HYDROcodone-acetaminophen, [MAR Hold] melatonin   Objective: Vitals:   12/04/2019 0751 12/07/2019 0846  BP: 138/63   Pulse: 60   Resp: 14   Temp: 97.7 F (36.5 C)   SpO2: 96% 91%    Intake/Output Summary (Last 24 hours) at 12/03/2019 1539 Last data filed at 12/04/2019 1100 Gross per 24 hour  Intake 240 ml  Output 875 ml  Net -635 ml   Filed Weights   11/28/19 0340 11/29/19 0405 12/06/2019 0544  Weight: 83.7 kg 83.6 kg 82.9 kg   Weight change: -0.726 kg Body mass index is 29.5 kg/m.   Physical Exam: General exam: Appears calm and comfortable. Not in physical distress Skin: No rashes, lesions or ulcers. HEENT: Atraumatic, normocephalic, supple neck, no obvious bleeding Lungs: Clear to auscultation bilaterally.  No crackles.  No wheezing. CVS: Regular rate and rhythm, ejection systolic murmur present. GI/Abd soft, nontender, nondistended, bowel sound present. CNS: Alert, awake, confused, slow to respond Psychiatry: Mood appropriate Extremities: no calf tenderness  Data Review: I  have personally reviewed the laboratory data and studies available.  Recent Labs  Lab 12/01/2019 0626 11/27/19 0612 11/23/2019 0511  WBC 11.1* 10.0 11.1*  HGB 9.5* 8.6* 9.2*  HCT 31.4* 28.6* 30.3*  MCV 96.9 97.3 97.7  PLT 206 171 168   Recent Labs  Lab 12/08/2019 0626 12/04/2019 0626 11/25/19 0320 11/29/19 0842 11/29/2019 0511 11/29/2019 0628 11/16/2019 1030  NA 141  --  139 138 139  --   --   K 4.5   < > 4.9 5.7* 5.9* 5.4* 5.7*  CL 104  --  104 104 104  --   --   CO2 27  --  26 26 22   --   --   GLUCOSE 108*  --  139* 117* 129*  --   --  BUN 51*  --  57* 57* 59*  --   --   CREATININE 1.93*  --  1.92* 2.09* 2.10*  --   --   CALCIUM 8.8*  --  8.5* 8.6* 8.7*  --   --    < > = values in this interval not displayed.   Signed, Terrilee Croak, MD [facility] Pager: [phone] (Secure Chat preferred). 11/09/2019

## 2019-11-30 NOTE — Op Note (Signed)
[facility]   MULTIDISCIPLINARY HEART VALVE TEAM   TAVR OPERATIVE NOTE   Date of Procedure:  11/12/2019  Preoperative Diagnosis: Severe Aortic Stenosis   Postoperative Diagnosis: Same   Procedure:    Transcatheter Aortic Valve Replacement - Percutaneous Transfemoral Approach  Edwards Sapien 3 THV (size 29 mm, model # 9600TFX, serial #[id number])   Co-Surgeons:  Valentina Gu. Roxy Manns, MD and Sherren Mocha, MD  Anesthesiologist:  Tamela Gammon, MD  Echocardiographer:  Ena Dawley, MD  Pre-operative Echo Findings:  Severe aortic stenosis  Moderate left ventricular systolic dysfunction  Post-operative Echo Findings:  mild paravalvular leak  unchanged left ventricular systolic function  BRIEF CLINICAL NOTE AND INDICATIONS FOR SURGERY  69 year old male with chronic systolic heart failure, coronary artery disease, moderate LV systolic dysfunction, who has developed severe low-flow low gradient aortic stenosis (stage D2) and is currently hospitalized with heart failure.  He has undergone cardiac resynchronization and has been anticoagulated for atrial fibrillation.  Other comorbidities include chronic lung disease and stage IIIb chronic kidney disease.  After undergoing extensive evaluation, he presents for TAVR today.  During the course of the patient's preoperative work up they have been evaluated comprehensively by a multidisciplinary team of specialists coordinated through the [facility] in the [facility].  They have been demonstrated to suffer from symptomatic severe aortic stenosis as noted above. The patient has been counseled extensively as to the relative risks and benefits of all options for the treatment of severe aortic stenosis including long term medical therapy, conventional surgery for aortic valve replacement, and transcatheter aortic valve replacement.  The patient has been independently evaluated in  formal cardiac surgical consultation by Dr Roxy Manns, who deemed the patient appropriate for TAVR. Based upon review of all of the patient's preoperative diagnostic tests they are felt to be candidate for transcatheter aortic valve replacement using the transfemoral approach as an alternative to conventional surgery.    Following the decision to proceed with transcatheter aortic valve replacement, a discussion has been held regarding what types of management strategies would be attempted intraoperatively in the event of life-threatening complications, including whether or not the patient would be considered a candidate for the use of cardiopulmonary bypass and/or conversion to open sternotomy for attempted surgical intervention.  The patient has been advised of a variety of complications that might develop peculiar to this approach including but not limited to risks of death, stroke, paravalvular leak, aortic dissection or other major vascular complications, aortic annulus rupture, device embolization, cardiac rupture or perforation, acute myocardial infarction, arrhythmia, heart block or bradycardia requiring permanent pacemaker placement, congestive heart failure, respiratory failure, renal failure, pneumonia, infection, other late complications related to structural valve deterioration or migration, or other complications that might ultimately cause a temporary or permanent loss of functional independence or other long term morbidity.  The patient provides full informed consent for the procedure as described and all questions were answered preoperatively.  DETAILS OF THE OPERATIVE PROCEDURE  PREPARATION:   The patient is brought to the operating room on the above mentioned date and central monitoring was established by the anesthesia team including placement of a central venous catheter and radial arterial line. The patient is placed in the supine position on the operating table.  Intravenous antibiotics are  administered. The patient is monitored closely throughout the procedure under conscious sedation.  Baseline transthoracic echocardiogram is performed. The patient's chest, abdomen, both groins, and both lower extremities are prepared and draped  in a sterile manner. A time out procedure is performed.   PERIPHERAL ACCESS:   Using ultrasound guidance, femoral arterial and venous access is obtained with placement of 6 Fr sheaths on the right side.  A pigtail diagnostic catheter was passed through the femoral arterial sheath under fluoroscopic guidance into the aortic root.  A temporary transvenous pacemaker catheter was passed through the femoral venous sheath under fluoroscopic guidance into the right ventricle.  The pacemaker was tested to ensure stable lead placement and pacemaker capture. Aortic root angiography was performed in order to determine the optimal angiographic angle for valve deployment.  TRANSFEMORAL ACCESS:  A micropuncture technique is used to access the left femoral artery under fluoroscopic and ultrasound guidance.  2 Perclose devices are deployed at 10' and 2' positions to 'PreClose' the femoral artery. An 8 French sheath is placed and then an Amplatz Superstiff wire is advanced through the sheath. This is changed out for a 14 French transfemoral E-Sheath after progressively dilating over the Superstiff wire.  An AL-2 catheter was used to direct a straight-tip exchange length wire across the native aortic valve into the left ventricle. This was exchanged out for a pigtail catheter and position was confirmed in the LV apex. Simultaneous LV and Ao pressures were recorded.  The pigtail catheter was exchanged for an Amplatz Extra-stiff wire in the LV apex.    BALLOON AORTIC VALVULOPLASTY:  Not performed  TRANSCATHETER HEART VALVE DEPLOYMENT:  An Edwards Sapien 3 transcatheter heart valve (size 29 mm) was prepared and crimped per manufacturer's guidelines, and the proper orientation of  the valve is confirmed on the [employer] delivery system. The valve was advanced through the introducer sheath using normal technique until in an appropriate position in the abdominal aorta beyond the sheath tip. The balloon was then retracted and using the fine-tuning wheel was centered on the valve. The valve was then advanced across the aortic arch using appropriate flexion of the catheter. The valve was carefully positioned across the aortic valve annulus. The Commander catheter was retracted using normal technique. Once final position of the valve has been confirmed by angiographic assessment, the valve is deployed while temporarily holding ventilation and during rapid ventricular pacing to maintain systolic blood pressure < 50 mmHg and pulse pressure < 10 mmHg. The balloon inflation is held for >3 seconds after reaching full deployment volume. Once the balloon has fully deflated the balloon is retracted into the ascending aorta and valve function is assessed using echocardiography. The patient's hemodynamic recovery following valve deployment is good.  The deployment balloon and guidewire are both removed. Echo demostrated acceptable post-procedural gradients, stable mitral valve function, and mild aortic insufficiency.    PROCEDURE COMPLETION:  The sheath was removed and femoral artery closure is performed using the 2 previously deployed Perclose devices.  Protamine is administered once femoral arterial repair was complete. The site is clear with no evidence of bleeding or hematoma after the sutures are tightened. The temporary pacemaker and pigtail catheters are removed. Mynx closure is used for contralateral femoral arterial hemostasis for the 6 Fr sheath.  The patient tolerated the procedure well and is transported to the surgical intensive care in stable condition. There were no immediate intraoperative complications. All sponge instrument and needle counts are verified correct at completion  of the operation.   The patient received a total of 20 mL of intravenous contrast during the procedure.   Sherren Mocha, MD 11/13/2019 5:17 PM

## 2019-11-30 NOTE — Progress Notes (Signed)
Pt in a dental procedure, will reattempt as time and pt allow.   11/16/2019 1200  PT Visit Information  Last PT Received On 12/01/2019  Reason Eval/Treat Not Completed Patient at procedure or test/unavailable    Mee Hives, PT MS Acute Rehab Dept. Number: [place] [facility] [place]

## 2019-11-30 NOTE — Progress Notes (Addendum)
Progress Note  Patient Name: Justin Boone Date of Encounter: 11/12/2019  [facility] Cardiologist: Sherren Mocha, MD   Subjective   Plan for TAVR today. Remains in sinus rhythm. Minimal pedal edema on exam. No chest pain.  Inpatient Medications    Scheduled Meds: . amiodarone  400 mg Oral Daily  . atorvastatin  80 mg Oral q1800  . chlorhexidine  1 application Topical Once  . clopidogrel  75 mg Oral Daily  . diphenhydrAMINE  50 mg Oral Once   Or  . diphenhydrAMINE  50 mg Intravenous Once  . DULoxetine  60 mg Oral Daily  . furosemide  40 mg Intravenous Once  . magnesium sulfate  40 mEq Other To OR  . metoprolol succinate  50 mg Oral Daily  . mometasone-formoterol  2 puff Inhalation BID  . multivitamin with minerals  1 tablet Oral Daily  . nicotine  21 mg Transdermal Daily  . predniSONE  50 mg Oral Q6H  . sodium chloride flush  3 mL Intravenous Q12H  . tamsulosin  0.4 mg Oral QPC breakfast   Continuous Infusions: . sodium chloride    . cefUROXime (ZINACEF)  IV    . dexmedetomidine    . heparin 30,000 units/NS 1000 mL solution for CELLSAVER    . norepinephrine (LEVOPHED) Adult infusion    . sodium chloride irrigation    . vancomycin     PRN Meds: HYDROcodone-acetaminophen, melatonin   Vital Signs    Vitals:   11/25/2019 0544 11/13/2019 0556 11/18/2019 0751 11/13/2019 0846  BP:  113/68 138/63   Pulse:  67 60   Resp:  17 14   Temp:  (!) 97.5 F (36.4 C) 97.7 F (36.5 C)   TempSrc:  Oral Oral   SpO2:  94% 96% 91%  Weight: 82.9 kg     Height:        Intake/Output Summary (Last 24 hours) at 12/02/2019 0916 Last data filed at 12/01/2019 0554 Gross per 24 hour  Intake 460 ml  Output 475 ml  Net -15 ml   Last 3 Weights 11/23/2019 11/29/2019 11/28/2019  Weight (lbs) 182 lb 12.8 oz 184 lb 6.4 oz 184 lb 9.6 oz  Weight (kg) 82.918 kg 83.643 kg 83.734 kg      Telemetry    AV paced rhythm, HR 60s - Personally Reviewed  ECG    AV paced rhythm, 67 bpm - Personally  Reviewed  Physical Exam   GEN: No acute distress.   Neck: No JVD Cardiac: RRR, no murmurs, rubs, or gallops.  Respiratory: Clear to auscultation bilaterally. GI: Soft, nontender, non-distended  MS: minimal pedal edema; No deformity. Neuro:  Nonfocal  Psych: Normal affect   Labs    High Sensitivity Troponin:  No results for input(s): TROPONINIHS in the last 720 hours.    Chemistry Recent Labs  Lab 11/27/2019 0626 11/21/2019 0626 11/25/19 0320 11/25/19 0320 11/29/19 0842 11/29/2019 0511 11/18/2019 0628  NA 141   < > 139  --  138 139  --   K 4.5   < > 4.9   < > 5.7* 5.9* 5.4*  CL 104   < > 104  --  104 104  --   CO2 27   < > 26  --  26 22  --   GLUCOSE 108*   < > 139*  --  117* 129*  --   BUN 51*   < > 57*  --  57* 59*  --   CREATININE  1.93*   < > 1.92*  --  2.09* 2.10*  --   CALCIUM 8.8*   < > 8.5*  --  8.6* 8.7*  --   PROT 6.1*  --  5.3*  --   --   --   --   ALBUMIN 2.8*  --  2.5*  --   --   --   --   AST 56*  --  35  --   --   --   --   ALT 86*  --  64*  --   --   --   --   ALKPHOS 94  --  73  --   --   --   --   BILITOT 1.1  --  0.8  --   --   --   --   GFRNONAA 35*   < > 35*  --  32* 32*  --   GFRAA 41*   < > 41*  --  37* 37*  --   ANIONGAP 10   < > 9  --  8 13  --    < > = values in this interval not displayed.     Hematology Recent Labs  Lab 11/13/2019 0626 11/27/19 0612 12/04/2019 0511  WBC 11.1* 10.0 11.1*  RBC 3.24* 2.94* 3.10*  HGB 9.5* 8.6* 9.2*  HCT 31.4* 28.6* 30.3*  MCV 96.9 97.3 97.7  MCH 29.3 29.3 29.7  MCHC 30.3 30.1 30.4  RDW 19.2* 19.3* 19.5*  PLT 206 171 168    BNP Recent Labs  Lab 11/29/19 1513  BNP 2,503.4*     DDimer No results for input(s): DDIMER in the last 168 hours.   Radiology    No results found.  Cardiac Studies   09/17/2019 transthoracic echocardiogram: 1. Left ventricular ejection fraction, by estimation, is 40 to 45%. The  left ventricle has mildly decreased function. The left ventricle  demonstrates global  hypokinesis. The left ventricular internal cavity size  was mildly dilated. Left ventricular  diastolic parameters are consistent with Grade II diastolic dysfunction  (pseudonormalization). Elevated left atrial pressure.  2. Right ventricular systolic function is mildly reduced. The right  ventricular size is mildly enlarged. There is moderately elevated  pulmonary artery systolic pressure.  3. Left atrial size was mildly dilated.  4. Right atrial size was mildly dilated.  5. The mitral valve is normal in structure. Mild mitral valve  regurgitation.  6. The aortic valve is tricuspid. Aortic valve regurgitation is mild.  Severe aortic valve stenosis.  7. The inferior vena cava is dilated in size with <50% respiratory  variability, suggesting right atrial pressure of 15 mmHg.   Patient Profile     69 y.o. male with history of chronic heart failure with moderate left ventricular systolic dysfunction (EF 50%), CAD (PCI-RCA 07/2019), severe aortic stenosis, history of recurrent persistent atrial fibrillation and atrial flutter with tachycardia-bradycardia syndrome, s/p CRT-P (Medtronic, [place], April 2021), COPD, hypertension, type II DM, CKD 3b, admitted with acute exacerbation of heart failure scheduled for TAVR tomorrow 11/28/2019.  Assessment & Plan    CHF - Echo 09/2019 with LVEF 40-45%, G2DD - minimal pedal edema on exam - IV lasix 40 mg daily - I/Os neutral - Toprol-XL 50 mg daily  Low gradient severe AS - TAVR today  CAD s/p PCI RCA 07/2019 - Denies chest pain - BB, statin, Plavix  Paroxysmal Afib - Warfarin held for TAVR - INR 1.4 - in NSR on amiodarone  CRT-P - normal functioning device  CKD stage 3 - creatinine 2.09>2.10 - baseline of around 2 - continue to trend  COPD - per primary team  For questions or updates, please contact [place] Please consult [email or web address] for contact info under        Signed, Cadence Ninfa Meeker, PA-C  11/22/2019, 9:16  AM    I have seen and examined the patient along with Cadence Ninfa Meeker, PA-C .  I have reviewed the chart, notes and new data.  I agree with PA/NP's note.  Key new complaints: a little sleepy (was given temazepam around midnight), but oriented Key examination changes: able to lie fully flat without dyspnea; late peaking aortic ejection murmur, trace ankle edema Key new findings / data: K 5.7, rechecked to 5.4. Has rec'd furosemide. Creatinine unchanged at 2.0.  PLAN: TAVR at noon today. This procedure has been fully reviewed with the patient and written informed consent has been obtained.   Sanda Klein, MD, [place] [phone] 12/01/2019, 10:06 AM

## 2019-12-01 ENCOUNTER — Inpatient Hospital Stay (HOSPITAL_COMMUNITY): Payer: Medicare HMO

## 2019-12-01 ENCOUNTER — Encounter (HOSPITAL_COMMUNITY): Payer: Self-pay | Admitting: Cardiovascular Disease

## 2019-12-01 DIAGNOSIS — Z952 Presence of prosthetic heart valve: Secondary | ICD-10-CM

## 2019-12-01 LAB — CBC
HCT: 26 % — ABNORMAL LOW (ref 39.0–52.0)
Hemoglobin: 7.9 g/dL — ABNORMAL LOW (ref 13.0–17.0)
MCH: 29.5 pg (ref 26.0–34.0)
MCHC: 30.4 g/dL (ref 30.0–36.0)
MCV: 97 fL (ref 80.0–100.0)
Platelets: 131 10*3/uL — ABNORMAL LOW (ref 150–400)
RBC: 2.68 MIL/uL — ABNORMAL LOW (ref 4.22–5.81)
RDW: 19.6 % — ABNORMAL HIGH (ref 11.5–15.5)
WBC: 8.1 10*3/uL (ref 4.0–10.5)
nRBC: 0.6 % — ABNORMAL HIGH (ref 0.0–0.2)

## 2019-12-01 LAB — BASIC METABOLIC PANEL
Anion gap: 12 (ref 5–15)
BUN: 69 mg/dL — ABNORMAL HIGH (ref 8–23)
CO2: 24 mmol/L (ref 22–32)
Calcium: 8.2 mg/dL — ABNORMAL LOW (ref 8.9–10.3)
Chloride: 103 mmol/L (ref 98–111)
Creatinine, Ser: 2.43 mg/dL — ABNORMAL HIGH (ref 0.61–1.24)
GFR calc Af Amer: 31 mL/min — ABNORMAL LOW (ref >60)
GFR calc non Af Amer: 27 mL/min — ABNORMAL LOW (ref >60)
Glucose, Bld: 161 mg/dL — ABNORMAL HIGH (ref 70–99)
Potassium: 5.3 mmol/L — ABNORMAL HIGH (ref 3.5–5.1)
Sodium: 139 mmol/L (ref 135–145)

## 2019-12-01 LAB — POCT ACTIVATED CLOTTING TIME
Activated Clotting Time: 142 seconds
Activated Clotting Time: 125 seconds
Activated Clotting Time: 268 seconds

## 2019-12-01 LAB — GLUCOSE, CAPILLARY
Glucose-Capillary: 146 mg/dL — ABNORMAL HIGH (ref 70–99)
Glucose-Capillary: 179 mg/dL — ABNORMAL HIGH (ref 70–99)
Glucose-Capillary: 164 mg/dL — ABNORMAL HIGH (ref 70–99)
Glucose-Capillary: 141 mg/dL — ABNORMAL HIGH (ref 70–99)
Glucose-Capillary: 126 mg/dL — ABNORMAL HIGH (ref 70–99)

## 2019-12-01 LAB — HEMOGLOBIN A1C
Hgb A1c MFr Bld: 6 % — ABNORMAL HIGH (ref 4.8–5.6)
Mean Plasma Glucose: 126 mg/dL

## 2019-12-01 LAB — MAGNESIUM: Magnesium: 2.6 mg/dL — ABNORMAL HIGH (ref 1.7–2.4)

## 2019-12-01 MED ORDER — WARFARIN SODIUM 5 MG PO TABS
6.0000 mg | ORAL_TABLET | Freq: Once | ORAL | Status: AC
  Administered 2019-12-01: 6 mg via ORAL
  Filled 2019-12-01: qty 1

## 2019-12-01 MED ORDER — WARFARIN - PHARMACIST DOSING INPATIENT: Freq: Every day | Status: DC

## 2019-12-01 NOTE — Progress Notes (Addendum)
PROGRESS NOTE  Justin Boone  DOB: Oct 05, 1950  PCP: Verdell Carmine., MD YKZ:[id number]  DOA: 11/19/2019  LOS: 11 days   Brief narrative: Patient is a 69 year old male with history of A. fib on Coumadin, CAD, bradycardia sp Medtronic pacer, DM 2, hypertension, severe AS, COPD, hypertension, chronic kidney disease stage IIIb with creatinine 1.9-2.3. Patient was hospitalized (6/7-6/12) at an outside hospital ([place]) for decompensated heart failure and COPD exacerbation.  He was treated with IV diuretics, IV Rocephin and steroids.  His renal function worsened from creatinine of 1.7 on 6/7 to 2.1 on 6/10.  6/7- CT chest showed right greater than left pleural effusion from apex to base with compressive volume loss of right middle lobe.   6/8 - He underwent thoracentesis with 1.1 L removed. 6/10 - Echocardiogram showed EF of 50 to 55%, abnormal left ventricular diastolic filling, multiple regional wall motion abnormalities, RVSP estimated to be 55 to 60 mmHg, moderate AAS, moderate to severe MR 6/12-patient was transferred from outside hospital to [facility] primarily for cardiology and nephrology evaluation. 6/22-patient underwent TAVR.  Subjective: Patient was seen and examined this afternoon. Feels good.  No pain. Per RN, while helping him ambulate earlier, patient felt weak and almost blacked out. Labs this morning with creatinine elevated.  Assessment/Plan: Acute on chronic diastolic CHF Essential hypertension -Diuresis done with IV Lasix in outside hospital. -With rising creatinine, Lasix is currently on hold.  -Underwent TAVR on 6/22.  Echo pending today. -Discharge meds per heart failure team: Lasix 80 mg daily, metoprolol 50 mg daily.  Symptomatic severe aortic stenosis -6/22, underwent TAVR. -Mild paravalvular leak per op note. -Clinically no symptoms.  Weakness/blank stare -Per RN, while helping him ambulate earlier, patient felt weak and almost blacked  out. -Creatinine up this morning.  Patient looks clinically dry as well.  Lasix remains on hold for now. -Monitor orthostatic vital signs.  I would not start him on IV hydration at this time.   COPD exacerbation -managed at outside hospital with oral steroids, nebulizer.   -Completed prednisone taper off on 6/21. -Stable breathing status.   CAD s/p PCI -no chest pain. -Discharged on Toprol 50 mg daily, Plavix 75 mg daily and Lipitor 80 mg daily.  History of A. fib/flutter, PPM in place -to discharge on amiodarone 400 mg daily.   -Coumadin resumed.   Chronic kidney disease stage IIIb- -Baseline creatinine 1.9-2.3.  Creatinine has remained at baseline throughout.  On today's blood work, it was elevated to 2.43.  Likely postop. -Repeat creatinine in the morning.   Type 2 diabetes mellitus -Last A1c 5.3, not on medications  BPH -Flomax.   Tobacco use -Smokes a pack a day.  Counseled for cessation   Mobility: Encourage ambulation Code Status:  Full code DVT prophylaxis: SCDs Start: 11/17/2019 1929  Antimicrobials: Not on scheduled antibiotics. Fluid: None Diet: Cardiac/diabetic diet  Consultants: Cardiology, cardiac surgery, dentistry Family Communication:  Not at bedside  Status is: Inpatient  Remains inpatient appropriate because: Monitor creatinine.  Dispo: The patient is from: Home              Anticipated d/c is to: Home              Anticipated d/c date is: 1 day              Patient currently is not medically stable to d/c.  Plan to discharge home tomorrow  Antimicrobials: Anti-infectives (From admission, onward)   Start     Dose/Rate  Route Frequency Ordered Stop   12/01/19 0115  vancomycin (VANCOCIN) IVPB 1000 mg/200 mL premix        1,000 mg 200 mL/hr over 60 Minutes Intravenous  Once 11/18/2019 1928 12/01/19 0306   11/11/2019 2115  cefUROXime (ZINACEF) 1.5 g in sodium chloride 0.9 % 100 mL IVPB     Discontinue     1.5 g 200 mL/hr over 30 Minutes Intravenous  Every 12 hours 12/03/2019 1928 12/02/19 2159   11/27/2019 0400  vancomycin (VANCOREADY) IVPB 1250 mg/250 mL        1,250 mg 166.7 mL/hr over 90 Minutes Intravenous To Surgery 11/29/19 0710 11/21/2019 1717   11/12/2019 0400  cefUROXime (ZINACEF) 1.5 g in sodium chloride 0.9 % 100 mL IVPB        1.5 g 200 mL/hr over 30 Minutes Intravenous To Surgery 11/29/19 0710 11/11/2019 1602   12/08/2019 1145  ceFAZolin (ANCEF) IVPB 2g/100 mL premix        2 g 200 mL/hr over 30 Minutes Intravenous To Short Stay 11/23/19 0940 12/03/2019 1245        Code Status: Full Code   Diet Order            Diet Heart Room service appropriate? Yes; Fluid consistency: Thin  Diet effective now                 Infusions:  . sodium chloride    . cefUROXime (ZINACEF)  IV 1.5 g (12/01/19 1201)  . sodium chloride irrigation      Scheduled Meds: . amiodarone  400 mg Oral Daily  . atorvastatin  80 mg Oral q1800  . clopidogrel  75 mg Oral Daily  . DULoxetine  60 mg Oral Daily  . mouth rinse  15 mL Mouth Rinse BID  . mometasone-formoterol  2 puff Inhalation BID  . multivitamin with minerals  1 tablet Oral Daily  . nicotine  21 mg Transdermal Daily  . sodium chloride flush  3 mL Intravenous Q12H  . tamsulosin  0.4 mg Oral QPC breakfast  . warfarin  6 mg Oral ONCE-1600  . Warfarin - Pharmacist Dosing Inpatient   Does not apply q1600    PRN meds: sodium chloride, acetaminophen **OR** acetaminophen, HYDROcodone-acetaminophen, melatonin, morphine injection, oxyCODONE, sodium chloride flush   Objective: Vitals:   12/01/19 0824 12/01/19 1205  BP:  115/74  Pulse:  76  Resp:  12  Temp:  97.7 F (36.5 C)  SpO2: 93% 100%    Intake/Output Summary (Last 24 hours) at 12/01/2019 1605 Last data filed at 12/04/2019 2300 Gross per 24 hour  Intake 1113.87 ml  Output 645 ml  Net 468.87 ml   Filed Weights   11/29/19 0405 12/02/2019 0544 12/01/19 0500  Weight: 83.6 kg 82.9 kg 82.8 kg   Weight change: -0.118 kg Body mass  index is 29.46 kg/m.   Physical Exam: General exam: Appears calm and comfortable. Not in physical distress Skin: No rashes, lesions or ulcers. HEENT: Atraumatic, normocephalic, supple neck, no obvious bleeding Lungs: Clear to auscultation bilaterally.  No crackles.  No wheezing. CVS: Regular rate and rhythm, ejection systolic murmur present. GI/Abd soft, nontender, nondistended, bowel sound present. CNS: Alert, awake, confused, slow to respond Psychiatry: Mood appropriate Extremities: no calf tenderness, no pedal edema  Data Review: I have personally reviewed the laboratory data and studies available.  Recent Labs  Lab 11/27/19 0612 11/27/19 0612 11/26/2019 0511 11/21/2019 1539 11/15/2019 1637 11/28/2019 1809 12/01/19 0233  WBC 10.0  --  11.1*  --   --   --  8.1  HGB 8.6*   < > 9.2* 9.5* 8.5* 8.5* 7.9*  HCT 28.6*   < > 30.3* 28.0* 25.0* 25.0* 26.0*  MCV 97.3  --  97.7  --   --   --  97.0  PLT 171  --  168  --   --   --  131*   < > = values in this interval not displayed.   Recent Labs  Lab 11/25/19 0320 11/25/19 0320 11/29/19 6010 11/29/19 0842 11/09/2019 0511 12/02/2019 0628 11/19/2019 1030 11/13/2019 1539 11/17/2019 1637 11/21/2019 1809 12/01/19 0233  NA 139   < > 138   < > 139  --   --  139 139 139 139  K 4.9   < > 5.7*   < > 5.9*   < > 5.7* 5.0 5.0 5.0 5.3*  CL 104   < > 104   < > 104  --   --  103 102 104 103  CO2 26  --  26  --  22  --   --   --   --   --  24  GLUCOSE 139*   < > 117*   < > 129*  --   --  128* 126* 123* 161*  BUN 57*   < > 57*   < > 59*  --   --  56* 60* 60* 69*  CREATININE 1.92*   < > 2.09*   < > 2.10*  --   --  2.20* 2.10* 2.10* 2.43*  CALCIUM 8.5*  --  8.6*  --  8.7*  --   --   --   --   --  8.2*  MG  --   --   --   --   --   --   --   --   --   --  2.6*   < > = values in this interval not displayed.   Signed, Terrilee Croak, MD [facility] Pager: [phone] (Secure Chat preferred). 12/01/2019

## 2019-12-01 NOTE — Progress Notes (Signed)
ANTICOAGULATION CONSULT NOTE - Initial Consult  Pharmacy Consult for warfarin Indication: atrial fibrillation  Allergies  Allergen Reactions   Contrast Media [Iodinated Diagnostic Agents]    Sulfa Antibiotics     Patient Measurements: Height: 5\' 6"  (167.6 cm) Weight: 82.8 kg (182 lb 8.7 oz) IBW/kg (Calculated) : 63.8  Vital Signs: Temp: 97.7 F (36.5 C) (06/23 1205) Temp Source: Oral (06/23 1205) BP: 115/74 (06/23 1205) Pulse Rate: 76 (06/23 1205)  Labs: Recent Labs    11/29/19 0842 11/29/19 1513 11/17/2019 0511 11/16/2019 1539 11/23/2019 1637 11/25/2019 1637 11/13/2019 1809 12/01/19 0233  HGB  --   --  9.2*   < > 8.5*   < > 8.5* 7.9*  HCT  --   --  30.3*   < > 25.0*  --  25.0* 26.0*  PLT  --   --  168  --   --   --   --  131*  APTT  --   --  35  --   --   --   --   --   LABPROT  --  16.3* 16.7*  --   --   --   --   --   INR  --  1.4* 1.4*  --   --   --   --   --   CREATININE   < >  --  2.10*   < > 2.10*  --  2.10* 2.43*   < > = values in this interval not displayed.    Estimated Creatinine Clearance: 29.8 mL/min (A) (by C-G formula based on SCr of 2.43 mg/dL (H)).   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  Now s/p TAVR and to re-srart warfarin per cards.  Last INR 1.4 yesterday 6/22.    PTA dosing:   Per OSH record: 6mg  on Wed and 3mg  AOD Per patient 6/13:2 mgTTSS and3 mg on MWF Per last outpt INR note 5/28:6mg  on Wed and 3mg  all other days   Goal of Therapy:  INR 2-3 Monitor platelets by anticoagulation protocol: Yes   Plan:  Warfarin 6mg  PO x 1  tonight Daily INR, s/s bleeding  Bertis Ruddy, PharmD Clinical Pharmacist Please check AMION for all [place] numbers 12/01/2019 2:43 PM

## 2019-12-01 NOTE — Progress Notes (Addendum)
[facility] VALVE TEAM  Patient Name: [name] Date of Encounter: 12/01/2019  Primary Cardiologist: Dr. Burt Knack (previously Dr. Otho Perl Dr. Burt Knack & Dr. Roxy Manns (TAVR)  Hospital Problem List     Principal Problem:   S/P TAVR (transcatheter aortic valve replacement) Active Problems:   Paroxysmal atrial fibrillation ([place])   COPD with acute exacerbation (HCC)   AKI (acute kidney injury) ([place])   Severe aortic stenosis   CAD (coronary artery disease)   Congestive heart failure (CHF) (HCC)   Type 2 diabetes mellitus with hyperlipidemia (HCC)   CKD (chronic kidney disease), stage III     Subjective   Can breath much better since TAVR. Very thankful for his care. Ready to go. No complaints .  Inpatient Medications    Scheduled Meds: . amiodarone  400 mg Oral Daily  . atorvastatin  80 mg Oral q1800  . clopidogrel  75 mg Oral Daily  . DULoxetine  60 mg Oral Daily  . mouth rinse  15 mL Mouth Rinse BID  . mometasone-formoterol  2 puff Inhalation BID  . multivitamin with minerals  1 tablet Oral Daily  . nicotine  21 mg Transdermal Daily  . sodium chloride flush  3 mL Intravenous Q12H  . tamsulosin  0.4 mg Oral QPC breakfast   Continuous Infusions: . sodium chloride    . cefUROXime (ZINACEF)  IV Stopped (11/29/2019 2150)  . sodium chloride irrigation     PRN Meds: sodium chloride, acetaminophen **OR** acetaminophen, HYDROcodone-acetaminophen, melatonin, morphine injection, oxyCODONE, sodium chloride flush   Vital Signs    Vitals:   12/01/19 0500 12/01/19 0539 12/01/19 0732 12/01/19 0824  BP:  104/84 (!) 107/59   Pulse:  74 (!) 105   Resp:  20 18   Temp:  97.7 F (36.5 C) 98.2 F (36.8 C)   TempSrc:  Axillary Oral   SpO2:  93% 100% 93%  Weight: 82.8 kg     Height:        Intake/Output Summary (Last 24 hours) at 12/01/2019 0859 Last data filed at 11/27/2019 2300 Gross per 24 hour  Intake 1463.87 ml  Output 1245 ml  Net 218.87  ml   Filed Weights   11/29/19 0405 12/07/2019 0544 12/01/19 0500  Weight: 83.6 kg 82.9 kg 82.8 kg    Physical Exam   GEN: Well nourished, well developed, in no acute distress.chronically ill appreaing  HEENT: Grossly normal.  Neck: Supple, no JVD, carotid bruits, or masses. Cardiac: RRR, no murmurs, rubs, or gallops. No clubbing, cyanosis, 1+ bilateral LE edema  Respiratory:  Respirations regular and unlabored, clear to auscultation bilaterally. GI: Soft, nontender, nondistended, BS + x 4. MS: no deformity or atrophy. Skin: warm and dry, no rash.  Groin sites clear without hematoma or ecchymosis  Neuro:  Strength and sensation are intact. Psych: AAOx3.  Normal affect.  Labs    CBC Recent Labs    12/04/2019 0511 11/12/2019 1539 11/28/2019 1809 12/01/19 0233  WBC 11.1*  --   --  8.1  HGB 9.2*   < > 8.5* 7.9*  HCT 30.3*   < > 25.0* 26.0*  MCV 97.7  --   --  97.0  PLT 168  --   --  131*   < > = values in this interval not displayed.   Basic Metabolic Panel Recent Labs    11/27/2019 0511 12/07/2019 0628 12/04/2019 1809 12/01/19 0233  NA 139   < > 139 139  K  5.9*   < > 5.0 5.3*  CL 104   < > 104 103  CO2 22  --   --  24  GLUCOSE 129*   < > 123* 161*  BUN 59*   < > 60* 69*  CREATININE 2.10*   < > 2.10* 2.43*  CALCIUM 8.7*  --   --  8.2*  MG  --   --   --  2.6*   < > = values in this interval not displayed.   Liver Function Tests No results for input(s): AST, ALT, ALKPHOS, BILITOT, PROT, ALBUMIN in the last 72 hours. No results for input(s): LIPASE, AMYLASE in the last 72 hours. Cardiac Enzymes No results for input(s): CKTOTAL, CKMB, CKMBINDEX, TROPONINI in the last 72 hours. BNP Invalid input(s): POCBNP D-Dimer No results for input(s): DDIMER in the last 72 hours. Hemoglobin A1C Recent Labs    11/14/2019 0511  HGBA1C 6.0*   Fasting Lipid Panel No results for input(s): CHOL, HDL, LDLCALC, TRIG, CHOLHDL, LDLDIRECT in the last 72 hours. Thyroid Function Tests No results  for input(s): TSH, T4TOTAL, T3FREE, THYROIDAB in the last 72 hours.  Invalid input(s): FREET3  Telemetry    AV paced - Personally Reviewed  ECG    AV paced - Personally Reviewed  Radiology    Structural Heart Procedure  Result Date: 12/03/2019 See surgical note for result.   Cardiac Studies   TAVR OPERATIVE NOTE   Date of Procedure:                12/01/2019  Preoperative Diagnosis:      Severe Aortic Stenosis   Postoperative Diagnosis:    Same   Procedure:        Transcatheter Aortic Valve Replacement - Percutaneous Left Transfemoral Approach             Edwards Sapien 3 THV (size 29 mm, model # 9600TFX, serial # [id number])              Co-Surgeons:                        Valentina Gu. Roxy Manns, MD and Sherren Mocha, MD  Anesthesiologist:                  Nelda Severe. Tobias Alexander, MD  Echocardiographer:              Ena Dawley, MD  Pre-operative Echo Findings: ? Severe low flow, low gradient aortic stenosis ? Moderate mitral regurgitation ? Severe left ventricular systolic dysfunction  Post-operative Echo Findings: ? Mild paravalvular leak ? Unchanged left ventricular systolic function  ____________________  Echo 12/01/19: will be done today.   Patient Profile     Justin Boone is a 69 y.o. male with a history of chronic combined systolic and diastolic congestive heart failure, low flow low gradient severe aortic stenosis, coronary artery disease status post recent PCI and stenting of the right coronary artery, recurrent paroxysmal atrial fibrillation, bradycardia with recent syncopal event status post Medtronic CRT BiV pacemaker, type 2 diabetes mellitus, hypertension, stage III chronic kidney disease, obstructive sleep apnea, and longstanding tobacco abuse with likely severe COPD who was admitted to the hospital in [facility] where he was visiting his son and transferred back to [facility] for management of acute exacerbation of chronic  congestive heart failure and severe AS. Structural heart team consulted for TAVR.   Assessment & Plan    Severe AS: s/p successful TAVR  with a 29 mm Edwards Sapien 3 THV via the TF approach on 11/09/2019. Post operative echo pending. Groin sites are stable. ECG with AV paced rhythm. Continue plavix and resume home coumadin tonight. He will need to establish care with out coumadin clinic (previously followed by Dr . Otho Perl in [place], but now wants to switch to Dr. Burt Knack). I have arranged for a coumadin clinic visit on 7/2 after he sees [employer]. Okay to discharge home with son from our perspective.    SignedAngelena Form, PA-C  12/01/2019, 8:59 AM  Pager [phone]  Patient seen, examined. Available data reviewed. Agree with findings, assessment, and plan as outlined by Nell Range, PA-C. Pt looks and feels well this morning. On my exam, he is alert, oriented, in NAD. Lungs with scattered rhonchi. Heart RRR without murmur. Abd: soft, NT. BL groin sites clear. Extremities with mild edema. Tele shows a paced rhythm. 2D echo interpretation pending but on my review of imaging, the TAVR valve has normal function, mean gradient 10 mmHg, no PVL. Pt to be restarted on warfarin. Will arrange OP structural heart follow-up.    Sherren Mocha, M.D. 12/01/2019 12:02 PM

## 2019-12-01 NOTE — Progress Notes (Signed)
Mobility Specialist - Progress Note   12/01/19 1703  Mobility  Activity Dangled on edge of bed  Level of Assistance Minimal assist, patient does 75% or more  Assistive Device None  Mobility Response Tolerated fair  Mobility performed by Mobility specialist  $Mobility charge 1 Mobility    Laying: 67 HR, 94/64 BP, 94% SpO2 Sitting up: 74 HR. 61/35 then 71/59 BP, 97% SpO2  Did not ambulate further or complete orthostatics as pt began to lose focus while sitting up and was struggling to keep his eyes open.   Pricilla Handler Mobility Specialist Mobility Specialist Phone: [phone]

## 2019-12-01 NOTE — Progress Notes (Signed)
CARDIAC REHAB PHASE I   PRE:  Rate/Rhythm: 88 dual pacing    BP: sitting 123/82    SaO2: 91 RA  MODE:  Ambulation: 60 ft   POST:  Rate/Rhythm: 73 apcing    BP: sitting 98/59 second attempt     SaO2: 93 RA  Pt eager to walk after he had rested. Mod assist to get to EOB. Pt stood and ambulated with RW, gait belt, guarded and slow. At 60 ft pt with decreased responsiveness, pale lips. Had him sit. He was more alert once sitting.  Rolled back to room. Pt was able to walk to recliner a few steps. Once in recliner had another episode of decreased responsiveness. Pt does not close eyes but blank stare. Responds to voice after 2-3 attempts. BP did not register initially, second attempt 98/59, which was lower than before walking. Pt with RN, on chair alarm. He sts these are the episodes he had at home. Do not think he should d/c today. [place], ACSM 12/01/2019 2:28 PM

## 2019-12-01 NOTE — Progress Notes (Signed)
Physical Therapy Treatment Patient Details Name: Justin Boone MRN: [id number] DOB: August 22, 1950 Today's Date: 12/01/2019    History of Present Illness Pt is a 69 year old male with history of A. fib on Coumadin, CAD, bradycardia status post Medtronic pacer, DM 2, hypertension, severe AS, COPD, hypertension, chronic kidney disease stage IIIb with creatinine 1.9-2.3 is [name] hospital [facility] hospital transfer from [place] for cardiology evaluation.  He is followed by Dr. Burt Knack as an outpatient for evaluation for TAVR.  Apparently has had extensive evaluation at [facility] but patient desired to have care done at [facility] health.  He was admitted at OSH for the past 5 days, where he was given IV diuresis and underwent a right-sided thoracentesis for symptomatic right-sided pleural effusion. Pt is now s/p TAVR on 6/22.    PT Comments    Pt seen for re-evaluation following his TAVR procedure on 6/22. Pt very limited this session secondary to lethargy, fatigue and cognitive deficits. He did note that he was given a heavier pain medication which could be causing his lethargy and cognition issues. He required min guard for transfers and min A for very short distance ambulation in room with RW. All VSS throughout. Pt would continue to benefit from skilled physical therapy services at this time while admitted and after d/c to address the below listed limitations in order to improve overall safety and independence with functional mobility.    Follow Up Recommendations  Supervision/Assistance - 24 hour;Home health PT     Equipment Recommendations  Other (comment) (?rollator, tub bench)    Recommendations for Other Services       Precautions / Restrictions Precautions Precautions: Fall Restrictions Weight Bearing Restrictions: No    Mobility  Bed Mobility Overal bed mobility: Needs Assistance Bed Mobility: Sit to Supine       Sit to supine: Supervision   General bed  mobility comments: cueing to perform due to cognition and lethargy; however, no physical assistance needed  Transfers Overall transfer level: Needs assistance Equipment used: Rolling walker (2 wheeled) Transfers: Sit to/from Stand Sit to Stand: Min guard         General transfer comment: guarding for safety and stability  Ambulation/Gait Ambulation/Gait assistance: Min assist Gait Distance (Feet): 20 Feet Assistive device: Rolling walker (2 wheeled) Gait Pattern/deviations: Step-through pattern;Decreased step length - right;Decreased step length - left;Decreased stride length Gait velocity: decreased   General Gait Details: pt with great difficulty with motor planning and attention to task. He required min A for safety and balance, as well as frequent cueing to continue with ambulation; however, was very limited secondary to fatigue and lethargy   Stairs             Wheelchair Mobility    Modified Rankin (Stroke Patients Only)       Balance Overall balance assessment: Needs assistance Sitting-balance support: No upper extremity supported;Feet supported Sitting balance-Leahy Scale: Fair     Standing balance support: Bilateral upper extremity supported;During functional activity Standing balance-Leahy Scale: Poor Standing balance comment: relies on RW for support                            Cognition Arousal/Alertness: Lethargic;Suspect due to medications Behavior During Therapy: [facility] for tasks assessed/performed Overall Cognitive Status: Impaired/Different from baseline Area of Impairment: Attention;Memory;Following commands;Safety/judgement;Awareness;Problem solving                   Current Attention  Level: Sustained Memory: Decreased short-term memory;Decreased recall of precautions Following Commands: Follows one step commands inconsistently;Follows one step commands with increased time Safety/Judgement: Decreased awareness of  safety;Decreased awareness of deficits Awareness: Intellectual Problem Solving: Slow processing;Decreased initiation;Difficulty sequencing;Requires verbal cues;Requires tactile cues        Exercises      General Comments        Pertinent Vitals/Pain Pain Assessment: Faces Faces Pain Scale: Hurts little more Pain Location: L knee Pain Descriptors / Indicators: Grimacing;Guarding Pain Intervention(s): Monitored during session;Repositioned    Home Living                      Prior Function            PT Goals (current goals can now be found in the care plan section) Acute Rehab PT Goals PT Goal Formulation: With patient Time For Goal Achievement: 12/07/19 Potential to Achieve Goals: Good Progress towards PT goals: Progressing toward goals    Frequency    Min 3X/week      PT Plan Current plan remains appropriate    Co-evaluation              AM-PAC PT "6 Clicks" Mobility   Outcome Measure  Help needed turning from your back to your side while in a flat bed without using bedrails?: None Help needed moving from lying on your back to sitting on the side of a flat bed without using bedrails?: None Help needed moving to and from a bed to a chair (including a wheelchair)?: A Little Help needed standing up from a chair using your arms (e.g., wheelchair or bedside chair)?: A Little Help needed to walk in hospital room?: A Lot Help needed climbing 3-5 steps with a railing? : Total 6 Click Score: 17    End of Session Equipment Utilized During Treatment: Gait belt Activity Tolerance: Patient limited by fatigue;Patient limited by lethargy Patient left: in bed;with call bell/phone within reach;with bed alarm set Nurse Communication: Mobility status PT Visit Diagnosis: Muscle weakness (generalized) (M62.81);Other abnormalities of gait and mobility (R26.89)     Time: 0945-1000 PT Time Calculation (min) (ACUTE ONLY): 15 min  Charges:                         Anastasio Champion, DPT  Acute Rehabilitation Services Pager [phone] Office [place] 12/01/2019, 12:50 PM

## 2019-12-01 NOTE — Progress Notes (Signed)
Patient has home CPAP at bedside. Able to place self on/off as needed. Patient aware to call for assistance if needed.

## 2019-12-01 NOTE — Progress Notes (Signed)
  Echocardiogram 2D Echocardiogram has been performed.  Justin Boone 12/01/2019, 5:03 PM

## 2019-12-01 NOTE — Progress Notes (Addendum)
Progress Note  Patient Name: Justin Boone Date of Encounter: 12/01/2019  [facility] Cardiologist: Sherren Mocha, MD   Subjective   TAVR yesterday. Patient feels breathing is much better. Mild LLE on exam. He is anxious to go home.   Inpatient Medications    Scheduled Meds: . amiodarone  400 mg Oral Daily  . atorvastatin  80 mg Oral q1800  . clopidogrel  75 mg Oral Daily  . DULoxetine  60 mg Oral Daily  . mouth rinse  15 mL Mouth Rinse BID  . mometasone-formoterol  2 puff Inhalation BID  . multivitamin with minerals  1 tablet Oral Daily  . nicotine  21 mg Transdermal Daily  . sodium chloride flush  3 mL Intravenous Q12H  . tamsulosin  0.4 mg Oral QPC breakfast   Continuous Infusions: . sodium chloride    . cefUROXime (ZINACEF)  IV Stopped (11/09/2019 2150)  . nitroGLYCERIN    . sodium chloride irrigation     PRN Meds: sodium chloride, acetaminophen **OR** acetaminophen, HYDROcodone-acetaminophen, melatonin, morphine injection, oxyCODONE, sodium chloride flush   Vital Signs    Vitals:   12/01/19 0300 12/01/19 0500 12/01/19 0539 12/01/19 0732  BP: (!) 126/96  104/84 (!) 107/59  Pulse: 80  74 (!) 105  Resp: 15  20 18   Temp:   97.7 F (36.5 C) 98.2 F (36.8 C)  TempSrc:   Axillary Oral  SpO2: 92%  93% 100%  Weight:  82.8 kg    Height:        Intake/Output Summary (Last 24 hours) at 12/01/2019 0805 Last data filed at 12/05/2019 2300 Gross per 24 hour  Intake 1463.87 ml  Output 1245 ml  Net 218.87 ml   Last 3 Weights 12/01/2019 11/16/2019 11/29/2019  Weight (lbs) 182 lb 8.7 oz 182 lb 12.8 oz 184 lb 6.4 oz  Weight (kg) 82.8 kg 82.918 kg 83.643 kg      Telemetry    AV paced rhythm, HR 60s - Personally Reviewed  ECG    AV paced rhythm, 64 bpm, no acute changes - Personally Reviewed  Physical Exam   GEN: No acute distress.   Neck: No JVD Cardiac: RRR, no murmurs, rubs, or gallops.  Respiratory: Clear to auscultation bilaterally. GI: Soft, nontender,  non-distended  MS: mild lower leg edema; No deformity. Neuro:  Nonfocal  Psych: Normal affect   Labs    High Sensitivity Troponin:  No results for input(s): TROPONINIHS in the last 720 hours.    Chemistry Recent Labs  Lab 11/25/19 0320 11/25/19 0320 11/29/19 2426 11/29/19 8341 11/29/2019 0511 12/03/2019 0628 12/01/2019 1637 11/14/2019 1809 12/01/19 0233  NA 139   < > 138   < > 139   < > 139 139 139  K 4.9   < > 5.7*   < > 5.9*   < > 5.0 5.0 5.3*  CL 104   < > 104   < > 104   < > 102 104 103  CO2 26   < > 26  --  22  --   --   --  24  GLUCOSE 139*   < > 117*   < > 129*   < > 126* 123* 161*  BUN 57*   < > 57*   < > 59*   < > 60* 60* 69*  CREATININE 1.92*   < > 2.09*   < > 2.10*   < > 2.10* 2.10* 2.43*  CALCIUM 8.5*   < >  8.6*  --  8.7*  --   --   --  8.2*  PROT 5.3*  --   --   --   --   --   --   --   --   ALBUMIN 2.5*  --   --   --   --   --   --   --   --   AST 35  --   --   --   --   --   --   --   --   ALT 64*  --   --   --   --   --   --   --   --   ALKPHOS 73  --   --   --   --   --   --   --   --   BILITOT 0.8  --   --   --   --   --   --   --   --   GFRNONAA 35*   < > 32*  --  32*  --   --   --  27*  GFRAA 41*   < > 37*  --  37*  --   --   --  31*  ANIONGAP 9   < > 8  --  13  --   --   --  12   < > = values in this interval not displayed.     Hematology Recent Labs  Lab 11/27/19 0612 11/27/19 0612 11/23/2019 0511 11/28/2019 1539 11/23/2019 1637 11/18/2019 1809 12/01/19 0233  WBC 10.0  --  11.1*  --   --   --  8.1  RBC 2.94*  --  3.10*  --   --   --  2.68*  HGB 8.6*   < > 9.2*   < > 8.5* 8.5* 7.9*  HCT 28.6*   < > 30.3*   < > 25.0* 25.0* 26.0*  MCV 97.3  --  97.7  --   --   --  97.0  MCH 29.3  --  29.7  --   --   --  29.5  MCHC 30.1  --  30.4  --   --   --  30.4  RDW 19.3*  --  19.5*  --   --   --  19.6*  PLT 171  --  168  --   --   --  131*   < > = values in this interval not displayed.    BNP Recent Labs  Lab 11/29/19 1513  BNP 2,503.4*     DDimer No  results for input(s): DDIMER in the last 168 hours.   Radiology    Structural Heart Procedure  Result Date: 11/25/2019 See surgical note for result.   Cardiac Studies   09/17/2019 transthoracic echocardiogram: 1. Left ventricular ejection fraction, by estimation, is 40 to 45%. The  left ventricle has mildly decreased function. The left ventricle  demonstrates global hypokinesis. The left ventricular internal cavity size  was mildly dilated. Left ventricular  diastolic parameters are consistent with Grade II diastolic dysfunction  (pseudonormalization). Elevated left atrial pressure.  2. Right ventricular systolic function is mildly reduced. The right  ventricular size is mildly enlarged. There is moderately elevated  pulmonary artery systolic pressure.  3. Left atrial size was mildly dilated.  4. Right atrial size was mildly dilated.  5. The mitral valve is normal in structure. Mild mitral  valve  regurgitation.  6. The aortic valve is tricuspid. Aortic valve regurgitation is mild.  Severe aortic valve stenosis.  7. The inferior vena cava is dilated in size with <50% respiratory  variability, suggesting right atrial pressure of 15 mmHg.   Patient Profile     69 y.o. male with history of chronic heart failure with moderate left ventricular systolic dysfunction (EF 35%), CAD (PCI-RCA 07/2019), severe aortic stenosis, history of recurrent persistent atrial fibrillation and atrial flutter with tachycardia-bradycardia syndrome,s/pCRT-P ([employer], [place], April 2021), COPD, hypertension, type II DM,CKD 3b,admitted with acute exacerbation of heart failure scheduled for TAVR tomorrow 11/13/2019.  Assessment & Plan    CHF - Echo 09/2019 with LVEF 40-45%, G2DD - IV lasix held for procedure - patient was on 80 mg BID at baseline - creatinine up this morning, likely from dye - I/Os net +1L - Toprol-XL 50 mg daily - with mild LLE would restart home lasix  Low gradient severe  AS - TAVR yesterday - structural team following  CAD s/p PCI RCA 07/2019 - Denies chest pain - BB, statin, Plavix  Paroxysmal Afib - Warfarin held for TAVR - INR 1.4 - in NSR on amiodarone  CRT-P - normal functioning device  CKD stage 3 - creatinine worse today 2.10>2.43 - baseline of around 2  COPD - per primary team  For questions or updates, please contact [facility] Please consult [email or web address] for contact info under        Signed, Cadence Ninfa Meeker, PA-C  12/01/2019, 8:05 AM    I have seen and examined the patient along with Cadence Ninfa Meeker, PA-C .  I have reviewed the chart, notes and new data.  I agree with PA/NP's note.  Key new complaints: breathing better, lying fully supine without dyspnea Key examination changes: barely audible systolic murmur, trivial ankle edema. Groin OK bilaterally. Key new findings / data: echo pending. Creat up slightly. AV paced on monitor.  PLAN: [facility] will sign off.   Medication Recommendations:   - Resume warfarin at previous dose tonight - Clopidogrel 75 mg daily - Furosemide 80 mg daily (half of previous overall dose) - Amiodarone 400 mg daily - Metoprolol succinate 50 mg daily Other recommendations (labs, testing, etc):   - Echo today prior to DC - INR check on 7/2.  - Daily weights, keep log and bring to appt. Call office if he gains >2 lb in 24h or if he gains >5 lb from today's weight Follow up as an outpatient:   - Has 7/2 appt w [facility]   Sanda Klein, MD, [place] [phone] 12/01/2019, 10:45 AM

## 2019-12-02 ENCOUNTER — Inpatient Hospital Stay (HOSPITAL_COMMUNITY): Payer: Medicare HMO

## 2019-12-02 DIAGNOSIS — Z952 Presence of prosthetic heart valve: Secondary | ICD-10-CM

## 2019-12-02 LAB — POCT I-STAT 7, (LYTES, BLD GAS, ICA,H+H)
Acid-base deficit: 4 mmol/L — ABNORMAL HIGH (ref 0.0–2.0)
Bicarbonate: 20.6 mmol/L (ref 20.0–28.0)
Calcium, Ion: 1.13 mmol/L — ABNORMAL LOW (ref 1.15–1.40)
HCT: 23 % — ABNORMAL LOW (ref 39.0–52.0)
Hemoglobin: 7.8 g/dL — ABNORMAL LOW (ref 13.0–17.0)
O2 Saturation: 76 %
Patient temperature: 97.4
Potassium: 5.4 mmol/L — ABNORMAL HIGH (ref 3.5–5.1)
Sodium: 135 mmol/L (ref 135–145)
TCO2: 22 mmol/L (ref 22–32)
pCO2 arterial: 34.1 mmHg (ref 32.0–48.0)
pH, Arterial: 7.387 (ref 7.350–7.450)
pO2, Arterial: 40 mmHg — CL (ref 83.0–108.0)

## 2019-12-02 LAB — POCT I-STAT EG7
Acid-base deficit: 3 mmol/L — ABNORMAL HIGH (ref 0.0–2.0)
Bicarbonate: 23 mmol/L (ref 20.0–28.0)
Calcium, Ion: 1.12 mmol/L — ABNORMAL LOW (ref 1.15–1.40)
HCT: 28 % — ABNORMAL LOW (ref 39.0–52.0)
Hemoglobin: 9.5 g/dL — ABNORMAL LOW (ref 13.0–17.0)
O2 Saturation: 47 %
Patient temperature: 98.8
Potassium: 5.4 mmol/L — ABNORMAL HIGH (ref 3.5–5.1)
Sodium: 137 mmol/L (ref 135–145)
TCO2: 24 mmol/L (ref 22–32)
pCO2, Ven: 43 mmHg — ABNORMAL LOW (ref 44.0–60.0)
pH, Ven: 7.337 (ref 7.250–7.430)
pO2, Ven: 28 mmHg — CL (ref 32.0–45.0)

## 2019-12-02 LAB — ECHOCARDIOGRAM COMPLETE
Height: 66 in
Height: 66 in
Weight: 2920.65 oz
Weight: 2920.65 oz

## 2019-12-02 LAB — BODY FLUID CELL COUNT WITH DIFFERENTIAL
Eos, Fluid: 0 %
Lymphs, Fluid: 5 %
Monocyte-Macrophage-Serous Fluid: 30 % — ABNORMAL LOW (ref 50–90)
Neutrophil Count, Fluid: 65 % — ABNORMAL HIGH (ref 0–25)
Total Nucleated Cell Count, Fluid: 172 cu mm (ref 0–1000)

## 2019-12-02 LAB — CBC
HCT: 22.7 % — ABNORMAL LOW (ref 39.0–52.0)
Hemoglobin: 7 g/dL — ABNORMAL LOW (ref 13.0–17.0)
MCH: 30.2 pg (ref 26.0–34.0)
MCHC: 30.8 g/dL (ref 30.0–36.0)
MCV: 97.8 fL (ref 80.0–100.0)
Platelets: 129 10*3/uL — ABNORMAL LOW (ref 150–400)
RBC: 2.32 MIL/uL — ABNORMAL LOW (ref 4.22–5.81)
RDW: 19.7 % — ABNORMAL HIGH (ref 11.5–15.5)
WBC: 16.4 10*3/uL — ABNORMAL HIGH (ref 4.0–10.5)
nRBC: 0.6 % — ABNORMAL HIGH (ref 0.0–0.2)

## 2019-12-02 LAB — PREPARE RBC (CROSSMATCH)

## 2019-12-02 LAB — GLUCOSE, CAPILLARY
Glucose-Capillary: 153 mg/dL — ABNORMAL HIGH (ref 70–99)
Glucose-Capillary: 142 mg/dL — ABNORMAL HIGH (ref 70–99)

## 2019-12-02 LAB — PROTIME-INR
INR: 1.6 — ABNORMAL HIGH (ref 0.8–1.2)
Prothrombin Time: 18 seconds — ABNORMAL HIGH (ref 11.4–15.2)

## 2019-12-02 LAB — LACTATE DEHYDROGENASE, PLEURAL OR PERITONEAL FLUID: LD, Fluid: 101 U/L — ABNORMAL HIGH (ref 3–23)

## 2019-12-02 LAB — BASIC METABOLIC PANEL
Anion gap: 11 (ref 5–15)
BUN: 82 mg/dL — ABNORMAL HIGH (ref 8–23)
CO2: 23 mmol/L (ref 22–32)
Calcium: 8.2 mg/dL — ABNORMAL LOW (ref 8.9–10.3)
Chloride: 103 mmol/L (ref 98–111)
Creatinine, Ser: 3.05 mg/dL — ABNORMAL HIGH (ref 0.61–1.24)
GFR calc Af Amer: 23 mL/min — ABNORMAL LOW (ref >60)
GFR calc non Af Amer: 20 mL/min — ABNORMAL LOW (ref >60)
Glucose, Bld: 137 mg/dL — ABNORMAL HIGH (ref 70–99)
Potassium: 5.3 mmol/L — ABNORMAL HIGH (ref 3.5–5.1)
Sodium: 137 mmol/L (ref 135–145)

## 2019-12-02 LAB — PROTEIN, PLEURAL OR PERITONEAL FLUID: Total protein, fluid: <3 g/dL

## 2019-12-02 LAB — HEMOGLOBIN AND HEMATOCRIT, BLOOD
HCT: 23.8 % — ABNORMAL LOW (ref 39.0–52.0)
HCT: 24.6 % — ABNORMAL LOW (ref 39.0–52.0)
Hemoglobin: 7 g/dL — ABNORMAL LOW (ref 13.0–17.0)
Hemoglobin: 7.6 g/dL — ABNORMAL LOW (ref 13.0–17.0)

## 2019-12-02 MED ORDER — SODIUM CHLORIDE 0.9 % IV SOLN: INTRAVENOUS | Status: AC

## 2019-12-02 MED ORDER — NALOXONE HCL 0.4 MG/ML IJ SOLN
INTRAMUSCULAR | Status: AC
  Filled 2019-12-02: qty 1

## 2019-12-02 MED ORDER — WARFARIN SODIUM 3 MG PO TABS: 3.0000 mg | ORAL_TABLET | Freq: Once | ORAL | Status: DC

## 2019-12-02 MED ORDER — CHLORHEXIDINE GLUCONATE CLOTH 2 % EX PADS
6.0000 | MEDICATED_PAD | Freq: Every day | CUTANEOUS | Status: DC
  Administered 2019-12-02 – 2019-12-13 (×11): 6 via TOPICAL

## 2019-12-02 MED ORDER — FUROSEMIDE 10 MG/ML IJ SOLN
40.0000 mg | Freq: Once | INTRAMUSCULAR | Status: AC
  Administered 2019-12-02: 40 mg via INTRAVENOUS
  Filled 2019-12-02: qty 4

## 2019-12-02 MED ORDER — OXYCODONE HCL 5 MG PO TABS
5.0000 mg | ORAL_TABLET | ORAL | Status: DC | PRN
  Administered 2019-12-03: 5 mg via ORAL
  Filled 2019-12-02: qty 1

## 2019-12-02 MED ORDER — NOREPINEPHRINE 4 MG/250ML-% IV SOLN
0.0000 ug/min | INTRAVENOUS | Status: DC
  Administered 2019-12-02: 5 ug/min via INTRAVENOUS
  Filled 2019-12-02 (×2): qty 250

## 2019-12-02 MED ORDER — SODIUM CHLORIDE 0.9% IV SOLUTION: Freq: Once | INTRAVENOUS | Status: DC

## 2019-12-02 MED ORDER — SODIUM CHLORIDE 0.9% IV SOLUTION: Freq: Once | INTRAVENOUS | Status: AC

## 2019-12-02 MED ORDER — BUDESONIDE 0.25 MG/2ML IN SUSP
0.2500 mg | Freq: Two times a day (BID) | RESPIRATORY_TRACT | Status: DC
  Administered 2019-12-02 – 2019-12-03 (×2): 0.25 mg via RESPIRATORY_TRACT
  Filled 2019-12-02 (×2): qty 2

## 2019-12-02 NOTE — Progress Notes (Signed)
Mobility Specialist - Progress Note   12/02/19 1331  Mobility  Activity  (Cancel)  Mobility performed by Mobility specialist    Not seeing pt today after discussion w/ nurse, pt is transferring to ICU.    Pricilla Handler Mobility Specialist Mobility Specialist Phone: [phone]

## 2019-12-02 NOTE — Consult Note (Signed)
NAME:  Justin Boone, MRN:  [id number], DOB:  February 24, 1951, LOS: 12 ADMISSION DATE:  11/27/2019, CONSULTATION DATE:  12/02/2010 REFERRING MD:  Dr. Sallyanne Kuster , CHIEF COMPLAINT:  Hypotension and hypoxia  Brief History   Patient is a 69 year old male who transferred from outside facility for consultation of cardiology and nephrology.  Underwent TAVR due to severe aortic stenosis on 6/22.  1 days following TAVR patient seen with presyncopal episodes.  On 6/24 patient again seen with presyncopal episode and development of hypoxia with persistent hypotension necessitating transfer to ICU and consult to PCCM  History of present illness   Justin Boone is a 69 year old male with a past medical history significant for atrial fibrillation on anticoagulation with Coumadin, bradycardia status post pacemaker, systolic congestive heart failure, type 2 diabetes, hypertension, severe aortic stenosis now status post TAVR, chronic kidney disease stage III, gout, COPD, obstructive sleep apnea on, hyperlipidemia, CAD status post PCI, and chronic tobacco abuse who initially presented with worsening dyspnea to outside facility.  On 6/12 patient was transferred to [name] for cardiology and nephrology consultations.  Prior to transfer patient had CT chest done without contrast on 11/15/2019 that revealed right greater than left pleural effusion with compressive volume loss and underwent thoracentesis on 8/2 with 1.1 L removed.  On transfer to [name] he was medically optimized from cardiology and nephrology standpoint and underwent TAVR on 6/22.  Postop day 1 patient seen with episode of presyncope felt secondary to hypovolemia for which he received IV hydration.  The following day, postop day 2, patient had 2 additional episodes of presyncope with development of persistent hypotension and hypoxia precipitating cardiology consultation to critical care and transfer to ICU.  Concern for retroperitoneal bleed as well given drop  in hemoglobin.  Past Medical History  Chronic atrial fibrillation anticoagulated on Coumadin Bradycardia status post pacemaker placement Systolic congestive heart failure Type 2 diabetes Hypertension Severe aortic stenosis now status post TAVR Chronic kidney disease stage III Gout COPD  Daily tobacco abuse Obstructive sleep apnea with nocturnal CPAP, reports compliance Hyperlipidemia CAD status post PCI  Significant Hospital Events   Admitted 6/12 from outside facility  Consults:  Cardiology Nephrology PCCM Cardiothoracic surgery Dental medicine  Procedures:  Dental extraction of teeth 11, 13, and 14 6/16 TAVR 6/21  Significant Diagnostic Tests:  Chest x-ray 6/24 > moderate to severe right pleural effusion  Micro Data:  Covid 6/12 > negative Surgical PCR 6/16 > negative MRSA PCR screening 6/21 > negative  Antimicrobials:  None  Interim history/subjective:  Patient seen lying in bed slightly lethargic but able to answer all orientation questions.  Reports opioid administration causes significant sleepiness.  Objective   Blood pressure (!) 110/59, pulse 74, temperature 98.6 F (37 C), resp. rate 16, height 5\' 6"  (1.676 m), weight 83.1 kg, SpO2 94 %.        Intake/Output Summary (Last 24 hours) at 12/02/2019 1404 Last data filed at 12/02/2019 0900 Gross per 24 hour  Intake 75 ml  Output --  Net 75 ml   Filed Weights   11/11/2019 0544 12/01/19 0500 12/02/19 0516  Weight: 82.9 kg 82.8 kg 83.1 kg    Examination: General: Chronically ill appearing deconditioned middle-aged gentleman lying in bed in no acute distress HEENT: [place]/AT, MM pink/moist, PERRL, sclera nonicteric Neuro: Alert and oriented with intermittent episodes of lethargy and confusion, nonfocal CV: s1s2 regular rate and rhythm, no murmur, rubs, or gallops,  PULM: Bilateral expiratory, oxygen saturations 88 to 93% on 6  L nasal cannula, no acute respiratory distress GI: soft, bowel sounds active in  all 4 quadrants, non-tender, non-distended Extremities: warm/dry, bilateral 2+ pitting edema  Skin: no rashes or lesions   Resolved Hospital Problem list     Assessment & Plan:  Hypovolemic shock -Patient seen with presyncopal episode 6/23, 1 day postop improved with use of IV hydration.  Again seen with presyncopal episode x2 6/24 with later development of persistent hypotension -Differential diagnosis includes hypovolemia due to aggressive diuretic use and recent surgery versus retroperitoneal bleed P: Responding well to blood products Continue Levophed to maintain MAP greater than 65% CT scan abdomen and pelvis to assess retroperitoneal bleed Close monitoring now in the ICU setting Monitor volume status closely  Moderate right pleural effusion -It appears this is the second recurrence of right pleural effusion since admission.  Patient underwent thoracentesis at outside facility 6/8 with 1.1 L removed.  It appears pleural studies were pending at time of transfer therefore lights criteria unable to be determined History of COPD with daily tobacco abuse -Patient reports 1 pack/day smoking since age 23 -Treated for COPD exacerbation at OSH with oral steroids and bronchodilators, steroids ended 6/21 Obstructive sleep apnea compliant with CPAP P: Repeat thoracentesis at bedside today Obtain pleural fluid specimens  Continue supplemental oxygen to maintain sats greater than 88% Encourage pulmonary hygiene Patient will need to utilize CPAP at at bedtime and during rest Encourage IS and flutter valve Hold any further diuresing  Continue bronchodilators   Severe aortic stenosis status post TAVR -Underwent TAVR on 8/11 Combined systolic and congestive heart failure with EF of 40 to 45% -Repeat echocardiogram 40 to 45% with global hypokinesis seen on echo 6/23 CAD status post PCI P: Primary management per cardiology Strict intake and output Close monitoring of volume  status  Chronic atrial fibrillation anticoagulated on Coumadin P: Continue p.o. Coumadin Monitor INR, currently 1.6 Continuous telemetry   All other chronic medical conditions managed by primary team  Best practice:  Diet: Cardiac Pain/Anxiety/Delirium protocol (if indicated): As needed VAP protocol (if indicated): Not applicable DVT prophylaxis: P.o. Coumadin GI prophylaxis: PPI Glucose control: SSI Mobility: Bedrest Code Status: Full code Family Communication: Per primary Disposition: ICU  Labs   CBC: Recent Labs  Lab 11/27/19 0612 11/27/19 0612 11/25/2019 0511 11/15/2019 1539 12/06/2019 1809 12/01/19 0233 12/02/19 0347 12/02/19 1215 12/02/19 1254  WBC 10.0  --  11.1*  --   --  8.1 16.4*  --   --   HGB 8.6*   < > 9.2*   < > 8.5* 7.9* 7.0* 7.0* 7.8*  HCT 28.6*   < > 30.3*   < > 25.0* 26.0* 22.7* 23.8* 23.0*  MCV 97.3  --  97.7  --   --  97.0 97.8  --   --   PLT 171  --  168  --   --  131* 129*  --   --    < > = values in this interval not displayed.    Basic Metabolic Panel: Recent Labs  Lab 11/29/19 0842 11/29/19 0842 11/09/2019 0511 11/27/2019 0628 12/01/2019 1539 11/15/2019 1539 11/19/2019 1637 12/08/2019 1809 12/01/19 0233 12/02/19 0347 12/02/19 1254  NA 138   < > 139   < > 139   < > 139 139 139 137 135  K 5.7*   < > 5.9*   < > 5.0   < > 5.0 5.0 5.3* 5.3* 5.4*  CL 104   < > 104   < >  103  --  102 104 103 103  --   CO2 26  --  22  --   --   --   --   --  24 23  --   GLUCOSE 117*   < > 129*   < > 128*  --  126* 123* 161* 137*  --   BUN 57*   < > 59*   < > 56*  --  60* 60* 69* 82*  --   CREATININE 2.09*   < > 2.10*   < > 2.20*  --  2.10* 2.10* 2.43* 3.05*  --   CALCIUM 8.6*  --  8.7*  --   --   --   --   --  8.2* 8.2*  --   MG  --   --   --   --   --   --   --   --  2.6*  --   --    < > = values in this interval not displayed.   GFR: Estimated Creatinine Clearance: 23.8 mL/min (A) (by C-G formula based on SCr of 3.05 mg/dL (H)). Recent Labs  Lab 11/27/19 0612  11/25/2019 0511 12/01/19 0233 12/02/19 0347  WBC 10.0 11.1* 8.1 16.4*    Liver Function Tests: No results for input(s): AST, ALT, ALKPHOS, BILITOT, PROT, ALBUMIN in the last 168 hours. No results for input(s): LIPASE, AMYLASE in the last 168 hours. No results for input(s): AMMONIA in the last 168 hours.  ABG    Component Value Date/Time   PHART 7.387 12/02/2019 1254   PCO2ART 34.1 12/02/2019 1254   PO2ART 40 (LL) 12/02/2019 1254   HCO3 20.6 12/02/2019 1254   TCO2 22 12/02/2019 1254   ACIDBASEDEF 4.0 (H) 12/02/2019 1254   O2SAT 76.0 12/02/2019 1254     Coagulation Profile: Recent Labs  Lab 11/29/19 1513 11/27/2019 0511 12/02/19 0347  INR 1.4* 1.4* 1.6*    Cardiac Enzymes: No results for input(s): CKTOTAL, CKMB, CKMBINDEX, TROPONINI in the last 168 hours.  HbA1C: Hgb A1c MFr Bld  Date/Time Value Ref Range Status  11/26/2019 05:11 AM 6.0 (H) 4.8 - 5.6 % Final    Comment:    (NOTE)         Prediabetes: 5.7 - 6.4         Diabetes: >6.4         Glycemic control for adults with diabetes: <7.0   09/16/2019 11:52 PM 6.0 (H) 4.8 - 5.6 % Final    Comment:    (NOTE)         Prediabetes: 5.7 - 6.4         Diabetes: >6.4         Glycemic control for adults with diabetes: <7.0     CBG: Recent Labs  Lab 12/01/19 1401 12/01/19 1809 12/01/19 2122 12/02/19 0557 12/02/19 1129  GLUCAP 164* 141* 126* 153* 142*    Review of Systems: Positive in bold  Gen: Denies fever, chills, weight change, fatigue, night sweats HEENT: Denies blurred vision, double vision, hearing loss, tinnitus, sinus congestion, rhinorrhea, sore throat, neck stiffness, dysphagia PULM: Denies shortness of breath, cough, sputum production, hemoptysis, wheezing CV: Denies chest pain, edema, orthopnea, paroxysmal nocturnal dyspnea, palpitations GI: Denies abdominal pain, nausea, vomiting, diarrhea, hematochezia, melena, constipation, change in bowel habits GU: Denies dysuria, hematuria, polyuria, oliguria,  urethral discharge groin tenderness Endocrine: Denies hot or cold intolerance, polyuria, polyphagia or appetite change Derm: Denies rash, dry skin, scaling or peeling  skin change Heme: Denies easy bruising, bleeding, bleeding gums Neuro: Denies headache, numbness, weakness, slurred speech, loss of memory or consciousness  Past Medical History  He,  has a past medical history of A-fib ([place]), Bradycardia (09/17/2019), CHF (congestive heart failure) ([place]), Diabetes mellitus type II, controlled ([place]), HTN (hypertension), and 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   Surgical History    Past Surgical History:  Procedure Laterality Date  . BIV PACEMAKER INSERTION CRT-P N/A 09/22/2019   Procedure: BIV PACEMAKER INSERTION CRT-P;  Surgeon: Deboraha Sprang, MD;  Location: [place] [facility];  Service: Cardiovascular;  Laterality: N/A;  . CHOLECYSTECTOMY    . MULTIPLE EXTRACTIONS WITH ALVEOLOPLASTY Bilateral 11/14/2019   Procedure: Extraction of tooth #'s 11,13, and 14 with alveoloplasty and gross debridement of remaining dentition;  Surgeon: Lenn Cal, DDS;  Location: [place];  Service: Oral Surgery;  Laterality: Bilateral;  . SHOULDER SURGERY    . TEE WITHOUT CARDIOVERSION N/A 12/02/2019   Procedure: TRANSESOPHAGEAL ECHOCARDIOGRAM (TEE);  Surgeon: Sherren Mocha, MD;  Location: [place] [facility];  Service: Open Heart Surgery;  Laterality: N/A;  . TRANSCATHETER AORTIC VALVE REPLACEMENT, TRANSFEMORAL N/A 12/08/2019   Procedure: TRANSCATHETER AORTIC VALVE REPLACEMENT, TRANSFEMORAL;  Surgeon: Sherren Mocha, MD;  Location: [place] [facility];  Service: Open Heart Surgery;  Laterality: N/A;     Social History   reports that he has been smoking cigarettes. He has a 25.00 pack-year smoking history. He has never used smokeless tobacco. He reports that he does not drink alcohol and does not use drugs.   Family History   His family history includes Heart disease in his father  and mother; Stroke in his father.   Allergies Allergies  Allergen Reactions  . Contrast Media [Iodinated Diagnostic Agents]   . Sulfa Antibiotics      Home Medications  Prior to Admission medications   Medication Sig Start Date End Date Taking? Authorizing Provider  albuterol (PROVENTIL) (2.5 MG/3ML) 0.083% nebulizer solution Inhale 3 mLs into the lungs every 6 (six) hours as needed for shortness of breath or wheezing. 08/27/19  Yes [provider]  albuterol (VENTOLIN HFA) 108 (90 Base) MCG/ACT inhaler Inhale 2 puffs into the lungs every 6 (six) hours as needed for wheezing or shortness of breath.   Yes [provider]  allopurinol (ZYLOPRIM) 300 MG tablet Take 300 mg by mouth daily. 08/20/19  Yes [provider]  amiodarone (PACERONE) 400 MG tablet Take 1 tablet (400mg ) by mouth twice daily x 2 weeks Then decrease to 1 tablet (400mg ) daily x 4 weeks Patient taking differently: Take 400 mg by mouth daily.  10/06/19  Yes Deboraha Sprang, MD  atorvastatin (LIPITOR) 80 MG tablet Take 80 mg by mouth daily. 08/20/19  Yes [provider]  clopidogrel (PLAVIX) 75 MG tablet Take 75 mg by mouth daily. 08/12/19  Yes [provider]  DULoxetine (CYMBALTA) 60 MG capsule Take 60 mg by mouth daily. 07/30/19  Yes [provider]  furosemide (LASIX) 80 MG tablet Take 1 tablet (80 mg total) by mouth 2 (two) times daily. 09/28/19  Yes Shelly Coss, MD  metoprolol succinate (TOPROL-XL) 50 MG 24 hr tablet Take 50 mg by mouth daily. 08/20/19  Yes [provider]  pantoprazole (PROTONIX) 40 MG tablet Take 40 mg by mouth daily.   Yes [provider]  tamsulosin (FLOMAX) 0.4 MG CAPS capsule Take 0.4 mg by mouth daily. 07/30/19  Yes [provider]  Donnal Debar 100-62.5-25  MCG/INH AEPB Inhale 1 puff into the lungs daily. 08/27/19  Yes [provider]  warfarin (COUMADIN) 2 MG tablet Take 2 mg by mouth See admin instructions.  Tuesday, Thursday, Saturday, and Sunday evenings   Yes [provider]  warfarin (COUMADIN) 3 MG tablet Take 1 tablet (3 mg total) by mouth daily. Please cancel the earlier prescription for aspirin Patient taking differently: Take 3 mg by mouth every Monday, Wednesday, and Friday.  09/28/19 11/27/19 Yes Shelly Coss, MD     Critical care time:    Performed by: Johnsie Cancel  Total critical care time: 45 minutes  Critical care time was exclusive of separately billable procedures and treating other patients.  Critical care was necessary to treat or prevent imminent or life-threatening deterioration.  Critical care was time spent personally by me on the following activities: development of treatment plan with patient and/or surrogate as well as nursing, discussions with consultants, evaluation of patient's response to treatment, examination of patient, obtaining history from patient or surrogate, ordering and performing treatments and interventions, ordering and review of laboratory studies, ordering and review of radiographic studies, pulse oximetry and re-evaluation of patient's condition.  Johnsie Cancel, NP-C [facility] Contact / Pager information can be found on Amion  12/02/2019, 2:51 PM

## 2019-12-02 NOTE — Progress Notes (Signed)
CARDIAC REHAB PHASE I   PRE:  Rate/Rhythm: 79 pacing    BP: lying 95/55, sitting 93/53, standing 75/59    SaO2: 100 2 1/2L  MODE:  Ambulation: stood only   POST:  Rate/Rhythm: 93 pacing     BP: sitting 63/52, lying 68/42, lying again (lower) 93/51     SaO2: 98 2 1/2 L  Attempted to mobilize pt. Sts he feels "better". Needed assist to get to EOB. Took orthostatics. Pt c/o feeling woozy standing, eventually sat. BP still 63/52 after sitting for 3 min.  Put pt supine and still low, had to lower head. Pt had numerous "blank spells" during sitting, standing and lying back. When he looks blank, he responds to voice and seems to snap out of it. Pt also has increased, more aggresive tremulous movement when having blank spells. Pts cognition at baseline is hard to read. He answers questions but timeline is difficult to follow. At this time, he would not be safe with his son at home.  [id number]  [place], ACSM 12/02/2019 12:07 PM

## 2019-12-02 NOTE — Progress Notes (Signed)
Progress Note  Patient Name: Justin Boone Date of Encounter: 12/02/2019  [facility] Cardiologist: Sherren Mocha, MD   Subjective   Pt not doing well this afternoon. Has been noted to be hypotensive and lethargic. Seen with Nell Range and Dr Sallyanne Kuster this afternoon. PRCB transfusion is being started and ABG drawn. Pt denies pain or shortness of breath, just says he feels bad. Denies abdominal or groin pain.  Inpatient Medications    Scheduled Meds: . amiodarone  400 mg Oral Daily  . atorvastatin  80 mg Oral q1800  . clopidogrel  75 mg Oral Daily  . DULoxetine  60 mg Oral Daily  . mouth rinse  15 mL Mouth Rinse BID  . mometasone-formoterol  2 puff Inhalation BID  . multivitamin with minerals  1 tablet Oral Daily  . nicotine  21 mg Transdermal Daily  . sodium chloride flush  3 mL Intravenous Q12H  . tamsulosin  0.4 mg Oral QPC breakfast  . warfarin  3 mg Oral ONCE-1600  . Warfarin - Pharmacist Dosing Inpatient   Does not apply q1600   Continuous Infusions: . sodium chloride    . norepinephrine (LEVOPHED) Adult infusion 5 mcg/min (12/02/19 1320)  . sodium chloride irrigation     PRN Meds: sodium chloride, acetaminophen **OR** acetaminophen, HYDROcodone-acetaminophen, melatonin, morphine injection, oxyCODONE, sodium chloride flush   Vital Signs    Vitals:   12/02/19 1255 12/02/19 1256 12/02/19 1300 12/02/19 1304  BP: (!) 87/76 (!) 97/55 (!) 110/59 (!) 110/59  Pulse: 68 65 61 74  Resp: 16 18  16   Temp:    98.6 F (37 C)  TempSrc:      SpO2:      Weight:      Height:        Intake/Output Summary (Last 24 hours) at 12/02/2019 1338 Last data filed at 12/02/2019 0900 Gross per 24 hour  Intake 75 ml  Output --  Net 75 ml   Last 3 Weights 12/02/2019 12/01/2019 11/22/2019  Weight (lbs) 183 lb 3.2 oz 182 lb 8.7 oz 182 lb 12.8 oz  Weight (kg) 83.1 kg 82.8 kg 82.918 kg      Telemetry    AV paced rhythm - Personally Reviewed   Physical Exam  Lethargic, but  oriented to person and place, able to follow commands and moves all extremities to command. GEN: No acute distress.   Neck: JVP elevated Cardiac: RRR, no murmurs, rubs, or gallops. Distant heart sounds Respiratory:  coarse rhonchi bilaterally. GI: Soft, nontender, non-distended  MS: 2+ BL leg edema and right arm edema; No deformity. Neuro:  Nonfocal  BL groin sites with diffuse ecchymoses but no hematoma  Labs    High Sensitivity Troponin:  No results for input(s): TROPONINIHS in the last 720 hours.    Chemistry Recent Labs  Lab 11/28/2019 0511 12/04/2019 0628 11/29/2019 1809 11/27/2019 1809 12/01/19 0233 12/02/19 0347 12/02/19 1254  NA 139   < > 139   < > 139 137 135  K 5.9*   < > 5.0   < > 5.3* 5.3* 5.4*  CL 104   < > 104  --  103 103  --   CO2 22  --   --   --  24 23  --   GLUCOSE 129*   < > 123*  --  161* 137*  --   BUN 59*   < > 60*  --  69* 82*  --   CREATININE 2.10*   < >  2.10*  --  2.43* 3.05*  --   CALCIUM 8.7*  --   --   --  8.2* 8.2*  --   GFRNONAA 32*  --   --   --  27* 20*  --   GFRAA 37*  --   --   --  31* 23*  --   ANIONGAP 13  --   --   --  12 11  --    < > = values in this interval not displayed.     Hematology Recent Labs  Lab 11/15/2019 0511 12/05/2019 1539 12/01/19 0233 12/01/19 0233 12/02/19 0347 12/02/19 1215 12/02/19 1254  WBC 11.1*  --  8.1  --  16.4*  --   --   RBC 3.10*  --  2.68*  --  2.32*  --   --   HGB 9.2*   < > 7.9*   < > 7.0* 7.0* 7.8*  HCT 30.3*   < > 26.0*   < > 22.7* 23.8* 23.0*  MCV 97.7  --  97.0  --  97.8  --   --   MCH 29.7  --  29.5  --  30.2  --   --   MCHC 30.4  --  30.4  --  30.8  --   --   RDW 19.5*  --  19.6*  --  19.7*  --   --   PLT 168  --  131*  --  129*  --   --    < > = values in this interval not displayed.    BNP Recent Labs  Lab 11/29/19 1513  BNP 2,503.4*     DDimer No results for input(s): DDIMER in the last 168 hours.   Radiology    ECHOCARDIOGRAM COMPLETE  Result Date: 12/02/2019    ECHOCARDIOGRAM  LIMITED REPORT   Patient Name:   MARISSA LOWREY Date of Exam: 12/03/2019 Medical Rec #:  [id number]    Height:       66.0 in Accession #:    [id number]   Weight:       182.5 lb Date of Birth:  24-Apr-1951    BSA:          1.924 m Patient Age:    69 years     BP:           138/63 mmHg Patient Gender: M            HR:           60 bpm. Exam Location:  Inpatient Procedure: Limited Echo, Cardiac Doppler and Color Doppler Indications:     I35.2 Nonrheumatic aortic (valve) stenosis with insufficiency  History:         Patient has prior history of Echocardiogram examinations, most                  recent 09/17/2019. CAD, Abnormal ECG and Pacemaker, COPD, Aortic                  Valve Disease, Arrythmias:Atrial Fibrillation; Risk                  Factors:Diabetes. Severe aortic stenosis.  Sonographer:     Roseanna Rainbow RDCS Referring Phys:  [id number] Woodfin Ganja THOMPSON Diagnosing Phys: Ena Dawley MD  Sonographer Comments: TAVR procedure using 39mm Edwards Sapien valve. IMPRESSIONS  1. TAVR procedure: a 29 mmEdwards-SAPIEN 3 valve was successfully placed in the aortic position with improvement of peak/mean transaortic gradients  from 40/20 to 3/2 mmHg. There was only trivial paravalvular leak and no pericardial effusion.  2. Left ventricular ejection fraction, by estimation, is 40 to 45%. The left ventricle has mildly decreased function. The left ventricle demonstrates global hypokinesis. The left ventricular internal cavity size was mildly dilated.  3. Right ventricular systolic function is mildly reduced. The right ventricular size is moderately enlarged. There is mildly elevated pulmonary artery systolic pressure.  4. Left atrial size was moderately dilated.  5. Right atrial size was mildly dilated.  6. Moderate mitral valve regurgitation.  7. Aortic valve regurgitation is moderate. Severe aortic valve stenosis. Aortic valve mean gradient measures 20.0 mmHg. FINDINGS  Left Ventricle: Left ventricular ejection fraction, by  estimation, is 40 to 45%. The left ventricle has mildly decreased function. The left ventricle demonstrates global hypokinesis. The left ventricular internal cavity size was mildly dilated. Right Ventricle: The right ventricular size is moderately enlarged. Right ventricular systolic function is mildly reduced. There is mildly elevated pulmonary artery systolic pressure. The tricuspid regurgitant velocity is 3.00 m/s, and with an assumed right atrial pressure of 8 mmHg, the estimated right ventricular systolic pressure is 28.3 mmHg. Left Atrium: Left atrial size was moderately dilated. Right Atrium: Right atrial size was mildly dilated. Pericardium: Trivial pericardial effusion is present. The pericardial effusion is posterior to the left ventricle. There is no evidence of cardiac tamponade. Mitral Valve: There is moderate thickening of the mitral valve leaflet(s). There is moderate calcification of the mitral valve leaflet(s). Moderate mitral annular calcification. Moderate mitral valve regurgitation, with posteriorly-directed jet. Tricuspid Valve: Tricuspid valve regurgitation is mild. Aortic Valve: Aortic valve regurgitation is moderate. Severe aortic stenosis is present. There is severe thickening of the aortic valve. There is severe calcifcation of the aortic valve. Aortic valve mean gradient measures 20.0 mmHg. Aortic valve peak gradient measures 15.4 mmHg. Aortic valve area, by VTI measures 2.19 cm.  LEFT VENTRICLE PLAX 2D LVIDd:         5.07 cm      Diastology LVIDs:         3.40 cm      LV e' lateral:   8.59 cm/s LV PW:         1.63 cm      LV E/e' lateral: 7.8 LV IVS:        1.10 cm      LV e' medial:    5.33 cm/s LVOT diam:     2.40 cm      LV E/e' medial:  12.5 LV SV:         73 LV SV Index:   38 LVOT Area:     4.52 cm  LV Volumes (MOD) LV vol d, MOD A2C: 111.0 ml LV vol d, MOD A4C: 119.0 ml LV vol s, MOD A2C: 46.0 ml LV vol s, MOD A4C: 57.7 ml LV SV MOD A2C:     65.0 ml LV SV MOD A4C:     119.0 ml LV  SV MOD BP:      62.7 ml RIGHT VENTRICLE             IVC RV Basal diam:  4.00 cm     IVC diam: 3.10 cm RV Mid diam:    2.90 cm RV S prime:     13.60 cm/s TAPSE (M-mode): 2.1 cm LEFT ATRIUM              Index       RIGHT ATRIUM  Index LA diam:        5.50 cm  2.86 cm/m  RA Area:     28.30 cm LA Vol (A2C):   109.0 ml 56.66 ml/m RA Volume:   96.10 ml  49.96 ml/m LA Vol (A4C):   82.7 ml  42.99 ml/m LA Biplane Vol: 94.8 ml  49.28 ml/m  AORTIC VALVE AV Area (Vmax):    1.91 cm AV Area (Vmean):   1.80 cm AV Area (VTI):     2.19 cm AV Vmax:           196.00 cm/s AV Vmean:          131.750 cm/s AV VTI:            0.335 m AV Peak Grad:      15.4 mmHg AV Mean Grad:      20.0 mmHg LVOT Vmax:         82.55 cm/s LVOT Vmean:        52.300 cm/s LVOT VTI:          0.162 m LVOT/AV VTI ratio: 0.48  AORTA Ao Root diam: 3.15 cm Ao Asc diam:  3.70 cm MITRAL VALVE               TRICUSPID VALVE MV Area (PHT): 2.37 cm    TR Peak grad:   36.0 mmHg MV Decel Time: 320 msec    TR Vmax:        300.00 cm/s MV E velocity: 66.80 cm/s                            SHUNTS                            Systemic VTI:  0.16 m                            Systemic Diam: 2.40 cm Ena Dawley MD Electronically signed by Ena Dawley MD Signature Date/Time: 12/02/2019/12:14:36 PM    Final    ECHOCARDIOGRAM COMPLETE  Result Date: 12/02/2019    ECHOCARDIOGRAM REPORT   Patient Name:   Scherry Ran Date of Exam: 12/01/2019 Medical Rec #:  [id number]    Height:       56.0 in Accession #:    [id number]   Weight:       182.0 lb Date of Birth:  Jun 17, 1950    BSA:          1.706 m Patient Age:    63 years     BP:           115/74 mmHg Patient Gender: M            HR:           60 bpm. Exam Location:  Inpatient Procedure: 2D Echo, Cardiac Doppler and Color Doppler Indications:    Post TAVR Evaluation V43.3 / Z95.2  History:        Patient has prior history of Echocardiogram examinations, most                 recent 11/28/2019. CHF, Aortic Valve  Disease, Arrythmias:Atrial                 Fibrillation; Risk Factors:Hypertension and Diabetes.                 Aortic Valve:  29 mm Edwards Sapien prosthetic, stented (TAVR)                 valve is present in the aortic position. Procedure Date:                 12/06/2019.  Sonographer:    Jonelle Sidle Dance Referring Phys: [id number] [place]  1. Left ventricular ejection fraction, by estimation, is 40 to 45%. The left ventricle has mildly decreased function. The left ventricle demonstrates global hypokinesis. Indeterminate diastolic filling due to E-A fusion. There is the interventricular septum is flattened in diastole ('D' shaped left ventricle), consistent with right ventricular volume overload.  2. Right ventricular systolic function is mildly reduced. The right ventricular size is severely enlarged. There is mildly elevated pulmonary artery systolic pressure. The estimated right ventricular systolic pressure is 76.2 mmHg.  3. Left atrial size was severely dilated.  4. Right atrial size was severely dilated.  5. The mitral valve is normal in structure. Mild mitral valve regurgitation.  6. Marked tricuspid annulus dilation. Tricuspid valve regurgitation is moderate to severe.  7. The aortic valve has been repaired/replaced. Aortic valve regurgitation is not visualized. There is a 29 mm Edwards Sapien prosthetic (TAVR) valve present in the aortic position. Procedure Date: 11/19/2019. Echo findings are consistent with normal structure and function of the aortic valve prosthesis. Aortic valve area, by VTI measures 1.48 cm. Aortic valve mean gradient measures 9.0 mmHg. Aortic valve Vmax measures 2.14 m/s. FINDINGS  Left Ventricle: Left ventricular ejection fraction, by estimation, is 40 to 45%. The left ventricle has mildly decreased function. The left ventricle demonstrates global hypokinesis. The left ventricular internal cavity size was normal in size. There is  no left ventricular hypertrophy.  The interventricular septum is flattened in diastole ('D' shaped left ventricle), consistent with right ventricular volume overload and abnormal (paradoxical) septal motion, consistent with RV pacemaker. Indeterminate diastolic filling due to E-A fusion. Normal left ventricular filling pressure. Right Ventricle: The right ventricular size is severely enlarged. No increase in right ventricular wall thickness. Right ventricular systolic function is mildly reduced. There is mildly elevated pulmonary artery systolic pressure. The tricuspid regurgitant velocity is 3.00 m/s, and with an assumed right atrial pressure of 8 mmHg, the estimated right ventricular systolic pressure is 83.1 mmHg. Left Atrium: Left atrial size was severely dilated. Right Atrium: Right atrial size was severely dilated. Pericardium: Trivial pericardial effusion is present. Mitral Valve: The mitral valve is normal in structure. Mild mitral annular calcification. Mild mitral valve regurgitation, with centrally-directed jet. Tricuspid Valve: Marked tricuspid annulus dilation. The tricuspid valve is grossly normal. Tricuspid valve regurgitation is moderate to severe. The flow in the hepatic veins is reversed during ventricular systole. Aortic Valve: The aortic valve has been repaired/replaced. Aortic valve regurgitation is not visualized. Aortic valve mean gradient measures 9.0 mmHg. Aortic valve peak gradient measures 18.3 mmHg. Aortic valve area, by VTI measures 1.48 cm. There is a 29 mm Edwards Sapien prosthetic, stented (TAVR) valve present in the aortic position. Procedure Date: 12/04/2019. Echo findings are consistent with normal structure and function of the aortic valve prosthesis. Pulmonic Valve: The pulmonic valve was normal in structure. Pulmonic valve regurgitation is mild. Aorta: The aortic root is normal in size and structure. IAS/Shunts: No atrial level shunt detected by color flow Doppler.  LEFT VENTRICLE PLAX 2D LVIDd:         4.90 cm   Diastology LVIDs:         3.40  cm  LV e' lateral:   8.59 cm/s LV PW:         1.40 cm  LV E/e' lateral: 7.8 LV IVS:        1.10 cm  LV e' medial:    5.33 cm/s LVOT diam:     2.40 cm  LV E/e' medial:  12.5 LV SV:         56 LV SV Index:   33 LVOT Area:     4.52 cm  LEFT ATRIUM         Index LA diam:    5.50 cm 3.22 cm/m  AORTIC VALVE AV Area (Vmax):    1.42 cm AV Area (Vmean):   1.32 cm AV Area (VTI):     1.48 cm AV Vmax:           214.00 cm/s AV Vmean:          152.000 cm/s AV VTI:            0.379 m AV Peak Grad:      18.3 mmHg AV Mean Grad:      9.0 mmHg LVOT Vmax:         67.20 cm/s LVOT Vmean:        44.400 cm/s LVOT VTI:          0.124 m LVOT/AV VTI ratio: 0.33 MV E velocity: 66.80 cm/s  TRICUSPID VALVE                            TR Peak grad:   36.0 mmHg                            TR Vmax:        300.00 cm/s                             SHUNTS                            Systemic VTI:  0.12 m                            Systemic Diam: 2.40 cm Sanda Klein MD Electronically signed by Sanda Klein MD Signature Date/Time: 12/02/2019/11:16:39 AM    Final    Structural Heart Procedure  Result Date: 11/09/2019 See surgical note for result.   Cardiac Studies   POD #1 Echo:  IMPRESSIONS    1. Left ventricular ejection fraction, by estimation, is 40 to 45%. The  left ventricle has mildly decreased function. The left ventricle  demonstrates global hypokinesis. Indeterminate diastolic filling due to  E-A fusion. There is the interventricular  septum is flattened in diastole ('D' shaped left ventricle), consistent  with right ventricular volume overload.  2. Right ventricular systolic function is mildly reduced. The right  ventricular size is severely enlarged. There is mildly elevated pulmonary  artery systolic pressure. The estimated right ventricular systolic  pressure is 05.3 mmHg.  3. Left atrial size was severely dilated.  4. Right atrial size was severely dilated.  5. The mitral  valve is normal in structure. Mild mitral valve  regurgitation.  6. Marked tricuspid annulus dilation. Tricuspid valve regurgitation is  moderate to severe.  7. The aortic valve has been repaired/replaced. Aortic valve  regurgitation is not visualized. There is a 29 mm Edwards Sapien  prosthetic (TAVR) valve present in the aortic position. Procedure Date:  11/21/2019. Echo findings are consistent with normal  structure and function of the aortic valve prosthesis. Aortic valve area,  by VTI measures 1.48 cm. Aortic valve mean gradient measures 9.0 mmHg.  Aortic valve Vmax measures 2.14 m/s.   Patient Profile     69 y.o. 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  Assessment & Plan    1.  Acute on chronic combined systolic and diastolic heart failure: Hold metoprolol succinate in setting of hypotension.  Diurese as tolerated.  Likely will need inotropic support.  Discussed with Dr. Sallyanne Kuster and plan to initiate norepinephrine to obtain mean arterial pressure greater than 65 mmHg.  Will need to move to the CV-ICU. 2.  Hypotension/shock: Initiate IV norepinephrine.  Diurese as blood pressure will allow.  Consider invasive monitoring with central venous access for CVP and cooximetry measurement 3.  Severe stage D2 aortic stenosis (low-flow low gradient): Postoperative echo shows normal TAVR valve function. 4.  Paroxysmal atrial fibrillation: Maintaining sinus rhythm on amiodarone.  Warfarin has been reinitiated with INR drifting upward. 5.  Acute on chronic kidney injury: Creatinine increased to 3.05 mg/dL.  Baseline  creatinine in the range of approximately 2.0 to 2.2 mg/dL.  Consider hypotension/ATN plus contrast nephropathy.  Continue to follow closely. 6.  Acute hypoxemic respiratory failure: Suspect combination of congestive heart failure, chronic lung disease, right pleural effusion.  Portable chest x-ray reviewed with diffuse interstitial changes and opacification of the right lower lung field.  Await formal interpretation.  Consult CCM, consider thoracentesis. 7.  Acute blood loss anemia: Hemoglobin now in range of 7-7.8, down from 9 to 9.5 mg/dL preoperatively.  Exam is not suggestive of large groin bleed or retroperitoneal hematoma.  Will check noncontrast CT of the abdomen and pelvis to evaluate further.  The patient is critically ill with multiple organ systems failure and requires high complexity decision making for assessment and support, frequent evaluation and titration of therapies, application of advanced monitoring technologies and extensive interpretation of multiple databases. Case discussed with Dr Sallyanne Kuster, CCM consult to be placed. Will update patient's son.  Critical Care Time devoted to patient care services described in this note is 40 minutes      For questions or updates, please contact [place] Please consult [email or web address] for contact info under        Signed, Sherren Mocha, MD  12/02/2019, 1:38 PM

## 2019-12-02 NOTE — Progress Notes (Signed)
[facility] BRIEF SICU PROGRESS NOTE  2 Days Post-Op  S/P Procedure(s) (LRB): TRANSCATHETER AORTIC VALVE REPLACEMENT, TRANSFEMORAL (N/A) TRANSESOPHAGEAL ECHOCARDIOGRAM (TEE) (N/A)   Events of the day noted Currently awake and alert, denies pain or SOB AV paced w/ stable BP off levophed Right groin stable CT pending  Plan: Continue current plan  Rexene Alberts, MD 12/02/2019 5:09 PM

## 2019-12-02 NOTE — Progress Notes (Signed)
Progress Note  Patient Name: Justin Boone Date of Encounter: 12/02/2019  [facility] Cardiologist: Sherren Mocha, MD   Subjective   Near syncope due to orthostatic hypotension yesterday. BP a little better today, has not been out of bed yet.  Inpatient Medications    Scheduled Meds: . amiodarone  400 mg Oral Daily  . atorvastatin  80 mg Oral q1800  . clopidogrel  75 mg Oral Daily  . DULoxetine  60 mg Oral Daily  . mouth rinse  15 mL Mouth Rinse BID  . mometasone-formoterol  2 puff Inhalation BID  . multivitamin with minerals  1 tablet Oral Daily  . nicotine  21 mg Transdermal Daily  . sodium chloride flush  3 mL Intravenous Q12H  . tamsulosin  0.4 mg Oral QPC breakfast  . warfarin  3 mg Oral ONCE-1600  . Warfarin - Pharmacist Dosing Inpatient   Does not apply q1600   Continuous Infusions: . sodium chloride    . sodium chloride 100 mL/hr at 12/02/19 0916  . sodium chloride irrigation     PRN Meds: sodium chloride, acetaminophen **OR** acetaminophen, HYDROcodone-acetaminophen, melatonin, morphine injection, oxyCODONE, sodium chloride flush   Vital Signs    Vitals:   12/02/19 0542 12/02/19 0543 12/02/19 0616 12/02/19 0825  BP:   107/89 111/65  Pulse: 67  65 66  Resp: 11 12 12 12   Temp:  (!) 97.4 F (36.3 C) 97.6 F (36.4 C) 97.7 F (36.5 C)  TempSrc:  Axillary Axillary Oral  SpO2: (!) 88%  94% 94%  Weight:      Height:       No intake or output data in the 24 hours ending 12/02/19 1104 Last 3 Weights 12/02/2019 12/01/2019 11/17/2019  Weight (lbs) 183 lb 3.2 oz 182 lb 8.7 oz 182 lb 12.8 oz  Weight (kg) 83.1 kg 82.8 kg 82.918 kg      Telemetry    AV paced - Personally Reviewed  ECG    No new tracing - Personally Reviewed  Physical Exam  Comfortable lying fully supine GEN: No acute distress.   Neck: No JVD Cardiac: RRR, no murmurs, rubs, or gallops.  Respiratory: Clear to auscultation bilaterally. GI: Soft, nontender, non-distended  MS: No edema;  No deformity. Ecchymosis but no palpable hematoma R groin Neuro:  Nonfocal  Psych: Normal affect   Labs    High Sensitivity Troponin:  No results for input(s): TROPONINIHS in the last 720 hours.    Chemistry Recent Labs  Lab 12/02/2019 0511 12/06/2019 0628 12/05/2019 1809 12/01/19 0233 12/02/19 0347  NA 139   < > 139 139 137  K 5.9*   < > 5.0 5.3* 5.3*  CL 104   < > 104 103 103  CO2 22  --   --  24 23  GLUCOSE 129*   < > 123* 161* 137*  BUN 59*   < > 60* 69* 82*  CREATININE 2.10*   < > 2.10* 2.43* 3.05*  CALCIUM 8.7*  --   --  8.2* 8.2*  GFRNONAA 32*  --   --  27* 20*  GFRAA 37*  --   --  31* 23*  ANIONGAP 13  --   --  12 11   < > = values in this interval not displayed.     Hematology Recent Labs  Lab 11/29/2019 0511 11/29/2019 1539 11/19/2019 1809 12/01/19 0233 12/02/19 0347  WBC 11.1*  --   --  8.1 16.4*  RBC 3.10*  --   --  2.68* 2.32*  HGB 9.2*   < > 8.5* 7.9* 7.0*  HCT 30.3*   < > 25.0* 26.0* 22.7*  MCV 97.7  --   --  97.0 97.8  MCH 29.7  --   --  29.5 30.2  MCHC 30.4  --   --  30.4 30.8  RDW 19.5*  --   --  19.6* 19.7*  PLT 168  --   --  131* 129*   < > = values in this interval not displayed.    BNP Recent Labs  Lab 11/29/19 1513  BNP 2,503.4*     DDimer No results for input(s): DDIMER in the last 168 hours.   Radiology    Structural Heart Procedure  Result Date: 12/02/2019 See surgical note for result.   Cardiac Studies   ECHO 12/01/2019 1. Left ventricular ejection fraction, by estimation, is 40 to 45%. The  left ventricle has mildly decreased function. The left ventricle  demonstrates global hypokinesis. Indeterminate diastolic filling due to  E-A fusion. There is the interventricular  septum is flattened in diastole ('D' shaped left ventricle), consistent  with right ventricular volume overload.  2. Right ventricular systolic function is mildly reduced. The right  ventricular size is severely enlarged. There is mildly elevated pulmonary    artery systolic pressure. The estimated right ventricular systolic  pressure is 84.1 mmHg.  3. Left atrial size was severely dilated.  4. Right atrial size was severely dilated.  5. The mitral valve is normal in structure. Mild mitral valve  regurgitation.  6. Marked tricuspid annulus dilation. Tricuspid valve regurgitation is  moderate to severe.  7. The aortic valve has been repaired/replaced. Aortic valve  regurgitation is not visualized. There is a 29 mm Edwards Sapien  prosthetic (TAVR) valve present in the aortic position. Procedure Date:  11/13/2019. Echo findings are consistent with normal  structure and function of the aortic valve prosthesis. Aortic valve area,  by VTI measures 1.48 cm. Aortic valve mean gradient measures 9.0 mmHg.  Aortic valve Vmax measures 2.14 m/s.   Patient Profile     68 y.o. 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  Assessment & Plan    1. CHF - LVEF unchanged after TAVR. Cardiomyopathy precedes the aortic stenosis. - although he has elevated RA pressures (due to moderate to severe TR and right heart failure), he has orthostatic hypotension and probably is hypovolemic (low LA pressure). Getting a 500 mL fluid IV, then reassess orthostatic BP. - will likely need a lower dose of daily diuretic after TAVR. May leave off diuretics until his follow up on July 02, but will ask him to keep a close eye on his weight - creatinine up this morning, likely from hypotension and maybe contrast - I/Os net +1L - Toprol-XL 50 mg daily - need to tolerate some signs of R heart  failure  2. Low gradient severe AS - s/p TAVR with good result  3. CAD s/p PCI RCA 07/2019 - no angina - BB, statin, Plavix  4. Paroxysmal Afib - In normal rhythm. Warfarin held for TAVR, allowing INR to increase gradually without bridging (INR 1.6 today). On amiodarone  5. CRT-P - normal functioning device. Ap BiVp rhythm  6. CKD  stage 3 - creatinine continues to worsen. Probably volume-BP related, bur need to worry about contrast nephrotoxicity as well. Probably best to keep him one more day even if BP better later today.   For questions or updates, please contact [place] Please consult [email or web address] for contact info under        Signed, Sanda Klein, MD  12/02/2019, 11:04 AM

## 2019-12-02 NOTE — Progress Notes (Signed)
PROGRESS NOTE  Justin Boone  DOB: [birth year]  PCP: Verdell Carmine., MD ZSW:[id number]  DOA: 11/22/2019  LOS: 12 days   Brief narrative: Patient is a 69 year old male with history of A. fib on Coumadin, CAD, bradycardia sp Medtronic pacer, DM 2, hypertension, severe AS, COPD, hypertension, chronic kidney disease stage IIIb with creatinine 1.9-2.3. Patient was hospitalized (6/7-6/12) at an outside hospital ([place]) for decompensated heart failure and COPD exacerbation.  He was treated with IV diuretics, IV Rocephin and steroids.  His renal function worsened from creatinine of 1.7 on 6/7 to 2.1 on 6/10.  6/7- CT chest showed right greater than left pleural effusion from apex to base with compressive volume loss of right middle lobe.   6/8 - He underwent thoracentesis with 1.1 L removed. 6/10 - Echocardiogram showed EF of 50 to 55%, abnormal left ventricular diastolic filling, multiple regional wall motion abnormalities, RVSP estimated to be 55 to 60 mmHg, moderate AAS, moderate to severe MR 6/12-patient was transferred from outside hospital to [facility] primarily for cardiology and nephrology evaluation. 6/22-patient underwent TAVR.  Subjective: Patient was seen and examined this morning. Patient was not feeling good. Overnight he required oxygen supplementation. Yesterday his blood pressure dropped on ambulation. This morning I discussed with cardiologist Dr. Loletha Grayer and started the patient on IV fluid. Towards noon, while getting evaluated for cardiac rehab, patient had an episode of orthostatic hypotension and near syncope. Rapid response was called.  Patient was persistently hypotensive despite IV fluid. Lab work showed a rising WBC count. Hemoglobin was low at 7. 1 unit of PRBC transfused. Cardiology transferred the patient to [place].  Assessment/Plan: Acute on chronic diastolic CHF Hypotension -Seems to be in acute heart failure again today.  -Noted a plan to start on  norepinephrine drip, diuresis -Monitor blood pressure, creatinine, urine output. -Cardiology following.  Symptomatic severe aortic stenosis -6/22, underwent TAVR. -Mild paravalvular leak per op note. -Valve function intact per postprocedure echocardiogram.  COPD exacerbation -managed at outside hospital with oral steroids, nebulizer.   -Completed prednisone taper off on 6/21. -Stable breathing status.   CAD s/p PCI -no chest pain. -Discharged on Toprol 50 mg daily, Plavix 75 mg daily and Lipitor 80 mg daily.  History of A. fib/flutter, PPM in place -to discharge on amiodarone 400 mg daily.   -Coumadin resumed stable   Chronic kidney disease stage IIIb- -Baseline creatinine 1.9-2.3.  Creatinine has remained at baseline throughout.  On today's blood work, it was elevated to 3.05.   -Repeat creatinine in the morning.   Type 2 diabetes mellitus -Last A1c 5.3, not on medications  BPH -Flomax.   Tobacco use -Smokes a pack a day.  Counseled for cessation   Mobility: Encourage ambulation Code Status:  Full code DVT prophylaxis: SCDs Start: 11/29/2019 1929  Antimicrobials: Not on scheduled antibiotics. Fluid: None Diet: Cardiac/diabetic diet  Consultants: Cardiology Family Communication:  Not at bedside  Status is: Inpatient  Remains inpatient appropriate because: Monitor creatinine.  Dispo: The patient is from: Home              Anticipated d/c is to: Home              Anticipated d/c date is: Hold off on discharge plan today.              Patient currently is not medically stable to d/c.   Antimicrobials: Anti-infectives (From admission, onward)   Start     Dose/Rate Route Frequency Ordered Stop  12/01/19 0115  vancomycin (VANCOCIN) IVPB 1000 mg/200 mL premix        1,000 mg 200 mL/hr over 60 Minutes Intravenous  Once 11/22/2019 1928 12/01/19 0306   11/18/2019 2115  cefUROXime (ZINACEF) 1.5 g in sodium chloride 0.9 % 100 mL IVPB        1.5 g 200 mL/hr over 30  Minutes Intravenous Every 12 hours 12/07/2019 1928 12/02/19 0947   11/09/2019 0400  vancomycin (VANCOREADY) IVPB 1250 mg/250 mL        1,250 mg 166.7 mL/hr over 90 Minutes Intravenous To Surgery 11/29/19 0710 12/05/2019 1717   11/25/2019 0400  cefUROXime (ZINACEF) 1.5 g in sodium chloride 0.9 % 100 mL IVPB        1.5 g 200 mL/hr over 30 Minutes Intravenous To Surgery 11/29/19 0710 11/27/2019 1602   11/13/2019 1145  ceFAZolin (ANCEF) IVPB 2g/100 mL premix        2 g 200 mL/hr over 30 Minutes Intravenous To Short Stay 11/23/19 0940 12/08/2019 1245        Code Status: Full Code   Diet Order            Diet Heart Room service appropriate? Yes; Fluid consistency: Thin  Diet effective now                 Infusions:  . sodium chloride    . norepinephrine (LEVOPHED) Adult infusion 5 mcg/min (12/02/19 1320)  . sodium chloride irrigation      Scheduled Meds: . amiodarone  400 mg Oral Daily  . atorvastatin  80 mg Oral q1800  . budesonide (PULMICORT) nebulizer solution  0.25 mg Nebulization BID  . Chlorhexidine Gluconate Cloth  6 each Topical Daily  . clopidogrel  75 mg Oral Daily  . DULoxetine  60 mg Oral Daily  . mouth rinse  15 mL Mouth Rinse BID  . mometasone-formoterol  2 puff Inhalation BID  . multivitamin with minerals  1 tablet Oral Daily  . nicotine  21 mg Transdermal Daily  . sodium chloride flush  3 mL Intravenous Q12H  . tamsulosin  0.4 mg Oral QPC breakfast    PRN meds: sodium chloride, acetaminophen **OR** acetaminophen, melatonin, oxyCODONE, sodium chloride flush   Objective: Vitals:   12/02/19 1405 12/02/19 1427  BP: 119/68 (!) 120/99  Pulse: 83 65  Resp: 20 19  Temp:  98.6 F (37 C)  SpO2:  93%    Intake/Output Summary (Last 24 hours) at 12/02/2019 1556 Last data filed at 12/02/2019 1427 Gross per 24 hour  Intake 390 ml  Output --  Net 390 ml   Filed Weights   11/12/2019 0544 12/01/19 0500 12/02/19 0516  Weight: 82.9 kg 82.8 kg 83.1 kg   Weight change: 0.3  kg Body mass index is 29.57 kg/m.   Physical Exam: General exam: Lethargic look today Skin: No rashes, lesions or ulcers. HEENT: Atraumatic, normocephalic, supple neck, no obvious bleeding Lungs: Clear to auscultation bilaterally.  No crackles. No wheezing present.  CVS: Regular rate and rhythm, ejection systolic murmur present. GI/Abd soft, nontender, nondistended, bowel sound present. CNS: Alert, awake, confused Psychiatry: Mood appropriate Extremities: no calf tenderness, no pedal edema  Data Review: I have personally reviewed the laboratory data and studies available.  Recent Labs  Lab 11/27/19 0612 11/27/19 0612 12/01/2019 0511 11/14/2019 1539 12/01/19 0233 12/02/19 0347 12/02/19 1215 12/02/19 1254 12/02/19 1441  WBC 10.0  --  11.1*  --  8.1 16.4*  --   --   --  HGB 8.6*   < > 9.2*   < > 7.9* 7.0* 7.0* 7.8* 9.5*  HCT 28.6*   < > 30.3*   < > 26.0* 22.7* 23.8* 23.0* 28.0*  MCV 97.3  --  97.7  --  97.0 97.8  --   --   --   PLT 171  --  168  --  131* 129*  --   --   --    < > = values in this interval not displayed.   Recent Labs  Lab 11/29/19 0842 11/29/19 0842 11/21/2019 0511 11/28/2019 0628 12/03/2019 1539 11/09/2019 1539 11/10/2019 1637 11/17/2019 1637 11/26/2019 1809 12/01/19 0233 12/02/19 0347 12/02/19 1254 12/02/19 1441  NA 138   < > 139   < > 139   < > 139   < > 139 139 137 135 137  K 5.7*   < > 5.9*   < > 5.0   < > 5.0   < > 5.0 5.3* 5.3* 5.4* 5.4*  CL 104   < > 104   < > 103  --  102  --  104 103 103  --   --   CO2 26  --  22  --   --   --   --   --   --  24 23  --   --   GLUCOSE 117*   < > 129*   < > 128*  --  126*  --  123* 161* 137*  --   --   BUN 57*   < > 59*   < > 56*  --  60*  --  60* 69* 82*  --   --   CREATININE 2.09*   < > 2.10*   < > 2.20*  --  2.10*  --  2.10* 2.43* 3.05*  --   --   CALCIUM 8.6*  --  8.7*  --   --   --   --   --   --  8.2* 8.2*  --   --   MG  --   --   --   --   --   --   --   --   --  2.6*  --   --   --    < > = values in this  interval not displayed.   Signed, Terrilee Croak, MD [facility] Pager: [phone] (Secure Chat preferred). 12/02/2019

## 2019-12-02 NOTE — Progress Notes (Signed)
ANTICOAGULATION CONSULT NOTE - Initial Consult  Pharmacy Consult for warfarin Indication: atrial fibrillation  Allergies  Allergen Reactions  . Contrast Media [Iodinated Diagnostic Agents]   . Sulfa Antibiotics     Patient Measurements: Height: 5\' 6"  (167.6 cm) Weight: 83.1 kg (183 lb 3.2 oz) IBW/kg (Calculated) : 63.8  Vital Signs: Temp: 97.7 F (36.5 C) (06/24 0825) Temp Source: Oral (06/24 0825) BP: 111/65 (06/24 0825) Pulse Rate: 66 (06/24 0825)  Labs: Recent Labs    11/29/19 1513 12/07/2019 0511 12/01/2019 1539 12/01/2019 1809 12/05/2019 1809 12/01/19 0233 12/02/19 0347  HGB  --  9.2*   < > 8.5*   < > 7.9* 7.0*  HCT  --  30.3*   < > 25.0*  --  26.0* 22.7*  PLT  --  168  --   --   --  131* 129*  APTT  --  35  --   --   --   --   --   LABPROT 16.3* 16.7*  --   --   --   --  18.0*  INR 1.4* 1.4*  --   --   --   --  1.6*  CREATININE  --  2.10*   < > 2.10*  --  2.43* 3.05*   < > = values in this interval not displayed.    Estimated Creatinine Clearance: 23.8 mL/min (A) (by C-G formula based on SCr of 3.05 mg/dL (H)).   Medical History: Past Medical History:  Diagnosis Date  . A-fib ([place])   . Bradycardia 09/17/2019  . CHF (congestive heart failure) ([place])   . Diabetes mellitus type II, controlled ([place])   . HTN (hypertension)   . 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  Now s/p TAVR and to re-srart warfarin per cards.    INR 1.6 today after restarting coumadin. Hgb drops down to 7 today. We will have to monitor for bleeding. If dc home, continue with home dose below.   PTA dosing:   Per OSH record: 6mg  on Wed and 3mg  AOD Per patient 6/13:2 mgTTSS and3 mg on MWF Per last outpt INR note 5/28:6mg  on Wed and 3mg  all other days   Goal of Therapy:  INR 2-3 Monitor  platelets by anticoagulation protocol: Yes   Plan:  Coumadin 3mg  PO x 1 tonight Daily INR, s/s bleeding  Onnie Boer, PharmD, BCIDP, AAHIVP, CPP Infectious Disease Pharmacist 12/02/2019 9:04 AM

## 2019-12-02 NOTE — Progress Notes (Signed)
Results for ISAY, PERLEBERG (MRN [id number]) as of 12/02/2019 14:48  Ref. Range 12/02/2019 14:41  Sample type Unknown VENOUS  pH, Ven Latest Ref Range: 7.25 - 7.43  7.337  pCO2, Ven Latest Ref Range: 44 - 60 mmHg 43.0 (L)  pO2, Ven Latest Ref Range: 32 - 45 mmHg 28.0 (LL)  TCO2 Latest Ref Range: 22 - 32 mmol/L 24  Acid-base deficit Latest Ref Range: 0.0 - 2.0 mmol/L 3.0 (H)  Bicarbonate Latest Ref Range: 20.0 - 28.0 mmol/L 23.0  O2 Saturation Latest Units: % 47.0  Patient temperature Unknown 98.8 F  Collection site Unknown [employer] results given to Dr. Tamala Julian.

## 2019-12-02 NOTE — Progress Notes (Signed)
Patient BP 85/50. O2 72 on 2L. Very sleepy. Rapid response and MD called.

## 2019-12-02 NOTE — Progress Notes (Signed)
ABG results showed to MD and MD did not want any changes with oxygen at this time.

## 2019-12-02 NOTE — Progress Notes (Signed)
PA Tessa notified.

## 2019-12-02 NOTE — Progress Notes (Signed)
persistently hypotensive despite IV fluids. Hgb down to 7 and WBC increased. CT for possible retroperitoneal hematoma. Transfuse 1 unit PRBC.

## 2019-12-02 NOTE — Progress Notes (Signed)
RT setup and placed pt on his home CPAP for the night with setting of 5 cmH2O and 4 Lpm bled into the system. Pts respiratory status is stable at this time. RT will continue to monitor.

## 2019-12-02 NOTE — Procedures (Signed)
Central Venous Catheter Insertion Procedure Note  Justin Boone  [id number]  [birth year]  Date:12/02/19  Time:5:57 PM   Provider Performing:Royale Lennartz Loletha Grayer Tamala Julian   Procedure: Insertion of Non-tunneled Central Venous [phone]) with US guidance ([id number])   Indication(s) Medication administration  Consent Risks of the procedure as well as the alternatives and risks of each were explained to the patient and/or caregiver.  Consent for the procedure was obtained and is signed in the bedside chart  Anesthesia Topical only with 1% lidocaine   Timeout Verified patient identification, verified procedure, site/side was marked, verified correct patient position, special equipment/implants available, medications/allergies/relevant history reviewed, required imaging and test results available.  Sterile Technique Maximal sterile technique including full sterile barrier drape, hand hygiene, sterile gown, sterile gloves, mask, hair covering, sterile ultrasound probe cover (if used).  Procedure Description Area of catheter insertion was cleaned with chlorhexidine and draped in sterile fashion.  With real-time ultrasound guidance a central venous catheter was placed into the right internal jugular vein vein.  Nonpulsatile blood flow and easy flushing noted in all ports.  The catheter was sutured in place and sterile dressing applied.  Complications/Tolerance None; patient tolerated the procedure well. Chest X-ray is ordered to verify placement for internal jugular or subclavian cannulation.   Chest x-ray is not ordered for femoral cannulation.  EBL Minimal  Specimen(s) None

## 2019-12-02 NOTE — Procedures (Signed)
Thora w/ [place] Note Timeout performed R chest examined with [place] and skin overlying fluid pocket marked Area prepped and anesthesized with 1% lidocaine 1100  cc straw  fluid removed Bandaid applied to site CXR pending No immediate complications

## 2019-12-02 NOTE — Significant Event (Addendum)
Rapid Response Event Note  Overview: Hypotension, Hypoxia, and AMS  Initial Focused Assessment: I was on 4E and called to the bedside for low BPs and low oxygen saturation readings coupled with patient being very sleepy and confused. Upon arrival, patient could answer all my questions except he could not tell me the current month/year. Moves all extremities equally and sensation in intact. Patient falls asleep quickly and has to be prompted to stay awake. SBP in the 70-80s, getting a slow bolus over a few hours. HR - AV Paced 60s, 75 % on 2L [place] on arrival, I changed the pulse to the ear - and oxygen was increased to 4L- saturations were now 90-95% on 4L [place]. Patient did not wear his CPAP last night, lung sounds - rhonchi/rales throughout R >L. Skin gray and dusky toes and fingertips -- cool to touch in the extremities, + 2 all extremities, palpable pulses, bilateral groins are tender with bruising but overall soft. Bruising on arms and flank from fall. Pupils small 2 mm reactive and equal.   CARDS PA and MDs along with [facility] MD came to the bedside.   Interventions: -- 2nd PIV 20G LAC (+GBR) placed -- NS Bolus 500cc (over a few hours per CARDS) was started but stopped.  -- 1 Unit PRBC -- once started - SBP improved into the mid 90s - skin was more pink now.  -- STAT ABG -- PaO2 28/Sat 47 ? Mixed venous sample ? SpO2 now 90-95% on 4L [place] with good pleth waveform -- STAT CXR -- Lasix 40 mg IV  -- Narcan 0.4 mg IV -- Given since patient has received Narcotics for pain.  -- Levophed Infusion started at 5 mcg/kg/min -- CT A/P ordered -- will transfer to [facility] for stabilization first.   Plan of Care: --Transferred to [place] per Cardiology and PCCM was consulted.    Event Summary:  Call Time [place]  Justin Boone R

## 2019-12-03 DIAGNOSIS — I50810 Right heart failure, unspecified: Secondary | ICD-10-CM

## 2019-12-03 DIAGNOSIS — N179 Acute kidney failure, unspecified: Secondary | ICD-10-CM

## 2019-12-03 LAB — HEPATIC FUNCTION PANEL
ALT: 144 U/L — ABNORMAL HIGH (ref 0–44)
AST: 93 U/L — ABNORMAL HIGH (ref 15–41)
Albumin: 2.2 g/dL — ABNORMAL LOW (ref 3.5–5.0)
Alkaline Phosphatase: 122 U/L (ref 38–126)
Bilirubin, Direct: 0.7 mg/dL — ABNORMAL HIGH (ref 0.0–0.2)
Indirect Bilirubin: 1.1 mg/dL — ABNORMAL HIGH (ref 0.3–0.9)
Total Bilirubin: 1.8 mg/dL — ABNORMAL HIGH (ref 0.3–1.2)
Total Protein: 5 g/dL — ABNORMAL LOW (ref 6.5–8.1)

## 2019-12-03 LAB — BPAM RBC
Blood Product Expiration Date: 202107092359
Blood Product Expiration Date: 202107092359
ISSUE DATE / TIME: 202106241234
ISSUE DATE / TIME: 202106242111
Unit Type and Rh: 5100
Unit Type and Rh: 5100

## 2019-12-03 LAB — TYPE AND SCREEN
ABO/RH(D): O POS
Antibody Screen: NEGATIVE
Unit division: 0
Unit division: 0

## 2019-12-03 LAB — CBC
HCT: 24.9 % — ABNORMAL LOW (ref 39.0–52.0)
Hemoglobin: 8 g/dL — ABNORMAL LOW (ref 13.0–17.0)
MCH: 29.7 pg (ref 26.0–34.0)
MCHC: 32.1 g/dL (ref 30.0–36.0)
MCV: 92.6 fL (ref 80.0–100.0)
Platelets: 90 10*3/uL — ABNORMAL LOW (ref 150–400)
RBC: 2.69 MIL/uL — ABNORMAL LOW (ref 4.22–5.81)
RDW: 18.8 % — ABNORMAL HIGH (ref 11.5–15.5)
WBC: 12.8 10*3/uL — ABNORMAL HIGH (ref 4.0–10.5)
nRBC: 0.3 % — ABNORMAL HIGH (ref 0.0–0.2)

## 2019-12-03 LAB — BASIC METABOLIC PANEL
Anion gap: 12 (ref 5–15)
Anion gap: 12 (ref 5–15)
BUN: 83 mg/dL — ABNORMAL HIGH (ref 8–23)
BUN: 84 mg/dL — ABNORMAL HIGH (ref 8–23)
CO2: 23 mmol/L (ref 22–32)
CO2: 23 mmol/L (ref 22–32)
Calcium: 7.9 mg/dL — ABNORMAL LOW (ref 8.9–10.3)
Calcium: 7.9 mg/dL — ABNORMAL LOW (ref 8.9–10.3)
Chloride: 101 mmol/L (ref 98–111)
Chloride: 100 mmol/L (ref 98–111)
Creatinine, Ser: 3.34 mg/dL — ABNORMAL HIGH (ref 0.61–1.24)
Creatinine, Ser: 3.31 mg/dL — ABNORMAL HIGH (ref 0.61–1.24)
GFR calc Af Amer: 21 mL/min — ABNORMAL LOW (ref >60)
GFR calc Af Amer: 21 mL/min — ABNORMAL LOW (ref >60)
GFR calc non Af Amer: 18 mL/min — ABNORMAL LOW (ref >60)
GFR calc non Af Amer: 18 mL/min — ABNORMAL LOW (ref >60)
Glucose, Bld: 128 mg/dL — ABNORMAL HIGH (ref 70–99)
Glucose, Bld: 167 mg/dL — ABNORMAL HIGH (ref 70–99)
Potassium: 4.8 mmol/L (ref 3.5–5.1)
Potassium: 4.6 mmol/L (ref 3.5–5.1)
Sodium: 136 mmol/L (ref 135–145)
Sodium: 135 mmol/L (ref 135–145)

## 2019-12-03 LAB — COOXEMETRY PANEL
Carboxyhemoglobin: 2 % — ABNORMAL HIGH (ref 0.5–1.5)
Carboxyhemoglobin: 2 % — ABNORMAL HIGH (ref 0.5–1.5)
Methemoglobin: 1.2 % (ref 0.0–1.5)
Methemoglobin: 1.1 % (ref 0.0–1.5)
O2 Saturation: 53.8 %
O2 Saturation: 56.7 %
Total hemoglobin: 8.1 g/dL — ABNORMAL LOW (ref 12.0–16.0)
Total hemoglobin: 7.9 g/dL — ABNORMAL LOW (ref 12.0–16.0)

## 2019-12-03 LAB — DIC (DISSEMINATED INTRAVASCULAR COAGULATION)PANEL
D-Dimer, Quant: 2.13 ug/mL-FEU — ABNORMAL HIGH (ref 0.00–0.50)
Fibrinogen: 450 mg/dL (ref 210–475)
INR: 2.1 — ABNORMAL HIGH (ref 0.8–1.2)
Platelets: 87 10*3/uL — ABNORMAL LOW (ref 150–400)
Prothrombin Time: 22.8 seconds — ABNORMAL HIGH (ref 11.4–15.2)
Smear Review: NONE SEEN
aPTT: 48 seconds — ABNORMAL HIGH (ref 24–36)

## 2019-12-03 LAB — PROTIME-INR
INR: 2 — ABNORMAL HIGH (ref 0.8–1.2)
Prothrombin Time: 22.2 seconds — ABNORMAL HIGH (ref 11.4–15.2)

## 2019-12-03 LAB — TROPONIN I (HIGH SENSITIVITY)
Troponin I (High Sensitivity): 107 ng/L (ref <18)
Troponin I (High Sensitivity): 99 ng/L — ABNORMAL HIGH (ref <18)

## 2019-12-03 LAB — PH, BODY FLUID: pH, Body Fluid: 7.8

## 2019-12-03 LAB — RETICULOCYTES
Immature Retic Fract: 25.7 % — ABNORMAL HIGH (ref 2.3–15.9)
RBC.: 2.66 MIL/uL — ABNORMAL LOW (ref 4.22–5.81)
Retic Count, Absolute: 54.8 10*3/uL (ref 19.0–186.0)
Retic Ct Pct: 2.1 % (ref 0.4–3.1)

## 2019-12-03 LAB — IRON AND TIBC
Iron: 21 ug/dL — ABNORMAL LOW (ref 45–182)
Saturation Ratios: 7 % — ABNORMAL LOW (ref 17.9–39.5)
TIBC: 284 ug/dL (ref 250–450)
UIBC: 263 ug/dL

## 2019-12-03 LAB — LACTATE DEHYDROGENASE: LDH: 335 U/L — ABNORMAL HIGH (ref 98–192)

## 2019-12-03 LAB — FERRITIN: Ferritin: 888 ng/mL — ABNORMAL HIGH (ref 24–336)

## 2019-12-03 LAB — GLUCOSE, CAPILLARY: Glucose-Capillary: 129 mg/dL — ABNORMAL HIGH (ref 70–99)

## 2019-12-03 MED ORDER — CHLORHEXIDINE GLUCONATE CLOTH 2 % EX PADS
6.0000 | MEDICATED_PAD | Freq: Every day | CUTANEOUS | Status: DC
  Administered 2019-12-04 – 2019-12-07 (×2): 6 via TOPICAL

## 2019-12-03 MED ORDER — FUROSEMIDE 10 MG/ML IJ SOLN
60.0000 mg | Freq: Four times a day (QID) | INTRAMUSCULAR | Status: DC
  Administered 2019-12-03: 60 mg via INTRAVENOUS

## 2019-12-03 MED ORDER — PANTOPRAZOLE SODIUM 40 MG PO TBEC
40.0000 mg | DELAYED_RELEASE_TABLET | Freq: Every day | ORAL | Status: DC
  Administered 2019-12-03 – 2019-12-13 (×11): 40 mg via ORAL
  Filled 2019-12-03 (×11): qty 1

## 2019-12-03 MED ORDER — SODIUM CHLORIDE 0.9% FLUSH
10.0000 mL | Freq: Two times a day (BID) | INTRAVENOUS | Status: DC
  Administered 2019-12-03 – 2019-12-12 (×12): 10 mL
  Administered 2019-12-13: 40 mL
  Administered 2019-12-13: 10 mL

## 2019-12-03 MED ORDER — FUROSEMIDE 10 MG/ML IJ SOLN
60.0000 mg | Freq: Two times a day (BID) | INTRAMUSCULAR | Status: DC
  Filled 2019-12-03: qty 6

## 2019-12-03 MED ORDER — FUROSEMIDE 10 MG/ML IJ SOLN
10.0000 mg/h | INTRAVENOUS | Status: DC
  Administered 2019-12-03 – 2019-12-06 (×4): 10 mg/h via INTRAVENOUS
  Filled 2019-12-03 (×4): qty 25
  Filled 2019-12-03: qty 21

## 2019-12-03 MED ORDER — FUROSEMIDE 10 MG/ML IJ SOLN: 40.0000 mg | Freq: Once | INTRAMUSCULAR | Status: DC

## 2019-12-03 MED ORDER — MILRINONE LACTATE IN DEXTROSE 20-5 MG/100ML-% IV SOLN
0.1250 ug/kg/min | INTRAVENOUS | Status: DC
  Administered 2019-12-03 – 2019-12-05 (×5): 0.25 ug/kg/min via INTRAVENOUS
  Administered 2019-12-06 – 2019-12-08 (×3): 0.125 ug/kg/min via INTRAVENOUS
  Filled 2019-12-03: qty 100
  Filled 2019-12-03: qty 200
  Filled 2019-12-03 (×5): qty 100

## 2019-12-03 MED ORDER — SODIUM CHLORIDE 0.9% FLUSH
10.0000 mL | INTRAVENOUS | Status: DC | PRN
  Administered 2019-12-10: 10 mL

## 2019-12-03 NOTE — Evaluation (Signed)
Occupational Therapy Evaluation Patient Details Name: Justin Boone MRN: [id number] DOB: 05-04-1951 Today's Date: 12/03/2019    History of Present Illness Pt is a 69 year old male with history of A. 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 s/p right-sided thoracentesis for symptomatic right-sided pleural effusion at OSH. 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   Clinical Impression   Patient is s/p TAVR surgery resulting in functional limitations due to the deficits listed below (see OT problem list). Pt with poor activity tolerance and decrease balance. Pt requires (A) to manage RW and pulling up on RW.  Patient will benefit from skilled OT acutely to increase independence and safety with ADLS to allow discharge [place].     Follow Up Recommendations  Home health OT    Equipment Recommendations  3 in 1 bedside commode    Recommendations for Other Services       Precautions / Restrictions Precautions Precautions: Fall Precaution Comments: watch 02 and BP (orthostatic) Restrictions Weight Bearing Restrictions: No      Mobility Bed Mobility Overal bed mobility: Needs Assistance Bed Mobility: Supine to Sit;Sit to Supine     Supine to sit: Mod assist;+2 for physical assistance;HOB elevated Sit to supine: Supervision   General bed mobility comments: assist of 2 to elevate trunk to get to EOB with pt reaching for therapist's arms. Able to bring LEs into bed to return to supine and assist with pushing through LEs.  Transfers Overall transfer level: Needs assistance Equipment used: Rolling walker (2 wheeled) Transfers: Sit to/from Stand Sit to Stand: Min guard;Mod assist         General transfer comment: Initially Min guard to stand from EOB digressing to mod A after multiple attempts.  Cues for hand placement and for upright. Stabilizing RW per request. Stood from EOB x7.    Balance Overall balance assessment: Needs assistance Sitting-balance support: No upper extremity supported;Feet supported Sitting balance-Leahy Scale: Fair     Standing balance support: During functional activity Standing balance-Leahy Scale: Poor Standing balance comment: relies on RW for support                           ADL either performed or assessed with clinical judgement   ADL Overall ADL's : Needs assistance/impaired Eating/Feeding: Set up   Grooming: Minimal assistance;Sitting       Lower Body Bathing: Maximal assistance       Lower Body Dressing: Maximal assistance   Toilet Transfer: Moderate assistance             General ADL Comments: pt motivated by Darnelle Maffucci tritt     Vision Baseline Vision/History: Wears glasses       Perception     Praxis      Pertinent Vitals/Pain Pain Assessment: Faces Faces Pain Scale: Hurts little more Pain Location: right elbow with movement - arthritis Pain Descriptors / Indicators: Guarding;Grimacing;Discomfort Pain Intervention(s): Repositioned     Hand Dominance Right   Extremity/Trunk Assessment Upper Extremity Assessment Upper Extremity Assessment: RUE deficits/detail;Generalized weakness RUE Deficits / Details: reports pain at shoulder   Lower Extremity Assessment Lower Extremity Assessment: Defer to PT evaluation   Cervical / Trunk Assessment Cervical / Trunk Assessment: Normal   Communication Communication Communication: No difficulties   Cognition Arousal/Alertness: Awake/alert Behavior  During Therapy: WFL for tasks assessed/performed Overall Cognitive Status: Impaired/Different from baseline Area of Impairment: Memory                     Memory: Decreased short-term memory Following Commands: Follows one step commands with increased time       General Comments: Seems clearer today;  noted to have some STM deficits when talking with son about grandchildren. Multi modal cues at times.   General Comments  Son present in middle of session; supine BP100/54, sitting BP 81/56, standing BP 97/59, supine BP post activity 86/44 (56). Sp02 ranged from ~77-94% on 6L/min 02 [place].    Exercises General Exercises - Lower Extremity Long Arc Quad: AROM;Both;5 reps;Seated Other Exercises Other Exercises: 5x STS from EOB with Min guard-Mod A in 2 minutes. ( it took the patient exactly the length of travis tritt "[id number] and has a goal to complete it faster than the song next time)   Shoulder Instructions      Home Living Family/patient expects to be discharged to:: Private residence Living Arrangements: Alone Available Help at Discharge: Family Type of Home: House Home Access: Stairs to enter Technical brewer of Steps: 1 Entrance Stairs-Rails: None Home Layout: One level     Bathroom Shower/Tub: Teacher, early years/pre: [place]: Environmental consultant - 2 wheels;Shower seat;Grab bars - tub/shower;Bedside commode;Adaptive equipment;Cane - single point;Wheelchair - manual (states his wheelchair is old and RW broke when he fell) Adaptive Equipment: Financial trader Comments: motivated by grandchildren      Prior Functioning/Environment Level of Independence: Independent with assistive device(s);Needs assistance  Gait / Transfers Assistance Needed: reports using cane until recently he needed to use the RW and that he fell and the RW is broken now ADL's / Homemaking Assistance Needed: B/D self   Comments: drives        OT Problem List: Decreased strength;Decreased activity tolerance;Impaired balance (sitting and/or standing);Decreased cognition;Decreased safety awareness;Decreased knowledge of use of DME or AE;Decreased knowledge of precautions;Cardiopulmonary status limiting activity;Pain      OT Treatment/Interventions: Self-care/ADL  training;Therapeutic exercise;Neuromuscular education;Energy conservation;DME and/or AE instruction;Manual therapy;Therapeutic activities;Patient/family education;Cognitive remediation/compensation;Balance training    OT Goals(Current goals can be found in the care plan section) Acute Rehab OT Goals Patient Stated Goal: to go home OT Goal Formulation: With patient/family Time For Goal Achievement: 12/17/19 Potential to Achieve Goals: Good  OT Frequency: Min 3X/week   Barriers to D/C:            Co-evaluation PT/OT/SLP Co-Evaluation/Treatment: Yes Reason for Co-Treatment: For patient/therapist safety PT goals addressed during session: Mobility/safety with mobility;Strengthening/ROM OT goals addressed during session: ADL's and self-care;Proper use of Adaptive equipment and DME      AM-PAC OT "6 Clicks" Daily Activity     Outcome Measure Help from another person eating meals?: None Help from another person taking care of personal grooming?: A Little Help from another person toileting, which includes using toliet, bedpan, or urinal?: A Lot Help from another person bathing (including washing, rinsing, drying)?: A Lot Help from another person to put on and taking off regular upper body clothing?: A Lot Help from another person to put on and taking off regular lower body clothing?: A Lot 6 Click Score: 15   End of Session Equipment Utilized During Treatment: Gait belt;Rolling walker Nurse Communication: Mobility status;Precautions  Activity Tolerance: Patient tolerated treatment well Patient left: in bed;with call bell/phone within reach;with family/visitor present  OT Visit Diagnosis: Unsteadiness  on feet (R26.81);Muscle weakness (generalized) (M62.81);Pain                Time: 1425-1454 OT Time Calculation (min): 29 min Charges:  OT General Charges $OT Visit: 1 Visit OT Evaluation $OT Eval Moderate Complexity: 1 Mod   Brynn, OTR/L  Acute Rehabilitation Services Pager:  [phone] Office: [phone] .   Jeri Modena 12/03/2019, 4:48 PM

## 2019-12-03 NOTE — Progress Notes (Signed)
Progress Note  Patient Name: Justin Boone Date of Encounter: 12/03/2019  [facility] Cardiologist: Sherren Mocha, MD   Subjective   No chest pain or dyspnea. Feeling ok this am. The patient had some pain in his shoulder and back overnight, received oxycodone and states pain improved now.  Inpatient Medications    Scheduled Meds: . sodium chloride   Intravenous Once  . amiodarone  400 mg Oral Daily  . atorvastatin  80 mg Oral q1800  . budesonide (PULMICORT) nebulizer solution  0.25 mg Nebulization BID  . Chlorhexidine Gluconate Cloth  6 each Topical Daily  . clopidogrel  75 mg Oral Daily  . DULoxetine  60 mg Oral Daily  . furosemide  60 mg Intravenous Q12H  . mouth rinse  15 mL Mouth Rinse BID  . mometasone-formoterol  2 puff Inhalation BID  . multivitamin with minerals  1 tablet Oral Daily  . nicotine  21 mg Transdermal Daily  . sodium chloride flush  3 mL Intravenous Q12H  . tamsulosin  0.4 mg Oral QPC breakfast   Continuous Infusions: . sodium chloride    . norepinephrine (LEVOPHED) Adult infusion Stopped (12/02/19 1450)  . sodium chloride irrigation     PRN Meds: sodium chloride, acetaminophen **OR** acetaminophen, melatonin, oxyCODONE, sodium chloride flush   Vital Signs    Vitals:   12/03/19 0400 12/03/19 0500 12/03/19 0543 12/03/19 0600  BP: 105/62   119/62  Pulse: 61 65  65  Resp: 15 13  (!) 21  Temp:      TempSrc:      SpO2: 93% 94%  95%  Weight:   83.6 kg   Height:        Intake/Output Summary (Last 24 hours) at 12/03/2019 0724 Last data filed at 12/03/2019 0235 Gross per 24 hour  Intake 1034.5 ml  Output 725 ml  Net 309.5 ml   Last 3 Weights 12/03/2019 12/02/2019 12/01/2019  Weight (lbs) 184 lb 4.9 oz 183 lb 3.2 oz 182 lb 8.7 oz  Weight (kg) 83.6 kg 83.1 kg 82.8 kg      Telemetry    AV paced - Personally Reviewed   Physical Exam  Alert, oriented male, sitting in a chair, chronically ill-appearin GEN: No acute distress.   Neck:  moderate JVD Cardiac: RRR, distant heart sounds, 2/6 ejection murmur, no diastolic murmur  Respiratory: BL expiratory rhonchi, end expiratory wheezing GI: Soft, nontender, non-distended  MS: diffuse 2+ edema; No deformity. Neuro:  Nonfocal  Psych: Normal affect   Labs    High Sensitivity Troponin:  No results for input(s): TROPONINIHS in the last 720 hours.    Chemistry Recent Labs  Lab 12/01/19 0233 12/01/19 0233 12/02/19 0347 12/02/19 0347 12/02/19 1254 12/02/19 1441 12/03/19 0258  NA 139   < > 137   < > 135 137 136  K 5.3*   < > 5.3*   < > 5.4* 5.4* 4.8  CL 103  --  103  --   --   --  101  CO2 24  --  23  --   --   --  23  GLUCOSE 161*  --  137*  --   --   --  128*  BUN 69*  --  82*  --   --   --  83*  CREATININE 2.43*  --  3.05*  --   --   --  3.34*  CALCIUM 8.2*  --  8.2*  --   --   --  7.9*  GFRNONAA 27*  --  20*  --   --   --  18*  GFRAA 31*  --  23*  --   --   --  21*  ANIONGAP 12  --  11  --   --   --  12   < > = values in this interval not displayed.     Hematology Recent Labs  Lab 12/01/19 0233 12/01/19 0233 12/02/19 0347 12/02/19 1215 12/02/19 1441 12/02/19 1530 12/03/19 0258  WBC 8.1  --  16.4*  --   --   --  12.8*  RBC 2.68*  --  2.32*  --   --   --  2.69*  HGB 7.9*   < > 7.0*   < > 9.5* 7.6* 8.0*  HCT 26.0*   < > 22.7*   < > 28.0* 24.6* 24.9*  MCV 97.0  --  97.8  --   --   --  92.6  MCH 29.5  --  30.2  --   --   --  29.7  MCHC 30.4  --  30.8  --   --   --  32.1  RDW 19.6*  --  19.7*  --   --   --  18.8*  PLT 131*  --  129*  --   --   --  90*   < > = values in this interval not displayed.    BNP Recent Labs  Lab 11/29/19 1513  BNP 2,503.4*     DDimer No results for input(s): DDIMER in the last 168 hours.   Radiology    CT ABDOMEN PELVIS WO CONTRAST  Result Date: 12/02/2019 CLINICAL DATA:  69 year old male with concern for retroperitoneal bleed. EXAM: CT ABDOMEN AND PELVIS WITHOUT CONTRAST TECHNIQUE: Multidetector CT imaging of the  abdomen and pelvis was performed following the standard protocol without IV contrast. COMPARISON:  CT abdomen pelvis dated 04/03/2019. FINDINGS: Evaluation of this exam is limited in the absence of intravenous contrast. Lower chest: Partially visualized small left and moderate right pleural effusions. There is complete consolidative changes of the visualized right lower lobe with air bronchogram. There is partial compressive atelectasis of the left lower lobe. Patchy areas of ground-glass opacity throughout the remainder of the visualized lungs. Findings concerning for pneumonia. Clinical correlation and follow-up to resolution recommended. A partially visualized 2 cm rounded structure in the region of the right hilum (1/2) may represent extension of ascites. A hilar lymph node or mass is not entirely excluded. Follow-up with chest CT after resolution of current symptoms recommended. There is mild cardiomegaly. Partially visualized aortic valve repair. There is hypoattenuation of the cardiac blood pool suggestive of anemia. Clinical correlation is recommended. No intra-abdominal free air or free fluid. Hepatobiliary: Minimal irregularity of the liver contour may represent early changes of cirrhosis. Clinical correlation is recommended. No intrahepatic biliary ductal dilatation. Cholecystectomy. No retained calcified stone noted in the central CBD. Pancreas: Unremarkable. No pancreatic ductal dilatation or surrounding inflammatory changes. Spleen: Normal in size without focal abnormality. Adrenals/Urinary Tract: The adrenal glands are unremarkable. Mild bilateral renal parenchyma atrophy. There is no hydronephrosis or nephrolithiasis on either side. Small bilateral renal exophytic lesions are not characterized but may represent cysts. The visualized ureters appear unremarkable. High attenuating content within the urinary bladder likely represents excretion of recently administered IV contrast. Stomach/Bowel: There is  moderate stool throughout the colon. There is no bowel obstruction or active inflammation. The appendix is normal as visualized. Vascular/Lymphatic:  Advanced aortoiliac atherosclerotic disease. The IVC is unremarkable. No portal venous gas. There is no adenopathy. Reproductive: The prostate and seminal vesicles are grossly unremarkable. Other: Diffuse subcutaneous edema and anasarca. No fluid collection or hematoma. No retroperitoneal fluid or hematoma. Musculoskeletal: Osteopenia with degenerative changes of the spine and left hip. Total right hip arthroplasty. No acute osseous pathology. IMPRESSION: 1. No acute intra-abdominal or pelvic pathology. No intraperitoneal or retroperitoneal fluid collection or hematoma. 2. Partially visualized bilateral pleural effusions with findings concerning for multifocal pneumonia. Clinical correlation and follow-up to resolution recommended. 3. Diffuse subcutaneous edema and anasarca. 4. Aortic Atherosclerosis (ICD10-I70.0). Electronically Signed   By: Anner Crete M.D.   On: 12/02/2019 17:44   DG CHEST PORT 1 VIEW  Result Date: 12/02/2019 CLINICAL DATA:  Thoracentesis. EXAM: PORTABLE CHEST 1 VIEW COMPARISON:  A 1302 hours CT chest 2520. FINDINGS: Patient is slightly rotated. Trachea is midline. Heart is enlarged, stable. Pacemaker lead tips are stable in position. Small residual right pleural effusion after thoracentesis. No pneumothorax. Diffuse mixed interstitial and airspace opacification persists. IMPRESSION: 1. Small residual right pleural effusion after thoracentesis. No pneumothorax. 2. Severe pulmonary edema versus multilobar pneumonia. Electronically Signed   By: Lorin Picket M.D.   On: 12/02/2019 15:57   DG Chest Port 1 View  Result Date: 12/02/2019 CLINICAL DATA:  69 year old male with history of syncopal event. EXAM: PORTABLE CHEST 1 VIEW COMPARISON:  Chest x-ray 11/25/2019. FINDINGS: Lung volumes are normal. Widespread but patchy airspace  consolidation is noted throughout the lungs bilaterally, most confluent throughout the right mid to lower lung. Moderate right pleural effusion. No definite left pleural effusion. No pneumothorax. Cephalization of the pulmonary vasculature with some indistinctness of interstitial markings. Mild cardiomegaly. Upper mediastinal contours are within normal limits. Aortic atherosclerosis. Left-sided biventricular pacemaker device in place with lead tips projecting over the expected location of the right atrium, right ventricle and left ventricle via the coronary sinus and coronary veins. IMPRESSION: 1. Worsening multilobar bilateral pneumonia, most severe throughout the right mid to lower lung. 2. Moderate right pleural effusion. 3. In addition, there is cardiomegaly with evidence of mild interstitial pulmonary edema, which may suggest superimposed congestive heart failure. 4. Aortic atherosclerosis. Electronically Signed   By: Vinnie Langton M.D.   On: 12/02/2019 14:33   ECHOCARDIOGRAM COMPLETE  Result Date: 12/02/2019    ECHOCARDIOGRAM REPORT   Patient Name:   Justin Boone Date of Exam: 12/01/2019 Medical Rec #:  [id number]    Height:       56.0 in Accession #:    [id number]   Weight:       182.0 lb Date of Birth:  [birth year]    BSA:          1.706 m Patient Age:    69 years     BP:           115/74 mmHg Patient Gender: M            HR:           60 bpm. Exam Location:  Inpatient Procedure: 2D Echo, Cardiac Doppler and Color Doppler Indications:    Post TAVR Evaluation V43.3 / Z95.2  History:        Patient has prior history of Echocardiogram examinations, most                 recent 11/29/2019. CHF, Aortic Valve Disease, Arrythmias:Atrial                 Fibrillation;  Risk Factors:Hypertension and Diabetes.                 Aortic Valve: 29 mm Edwards Sapien prosthetic, stented (TAVR)                 valve is present in the aortic position. Procedure Date:                 11/29/2019.  Sonographer:    Jonelle Sidle Dance  Referring Phys: [id number] [place]  1. Left ventricular ejection fraction, by estimation, is 40 to 45%. The left ventricle has mildly decreased function. The left ventricle demonstrates global hypokinesis. Indeterminate diastolic filling due to E-A fusion. There is the interventricular septum is flattened in diastole ('D' shaped left ventricle), consistent with right ventricular volume overload.  2. Right ventricular systolic function is mildly reduced. The right ventricular size is severely enlarged. There is mildly elevated pulmonary artery systolic pressure. The estimated right ventricular systolic pressure is 16.5 mmHg.  3. Left atrial size was severely dilated.  4. Right atrial size was severely dilated.  5. The mitral valve is normal in structure. Mild mitral valve regurgitation.  6. Marked tricuspid annulus dilation. Tricuspid valve regurgitation is moderate to severe.  7. The aortic valve has been repaired/replaced. Aortic valve regurgitation is not visualized. There is a 29 mm Edwards Sapien prosthetic (TAVR) valve present in the aortic position. Procedure Date: 11/27/2019. Echo findings are consistent with normal structure and function of the aortic valve prosthesis. Aortic valve area, by VTI measures 1.48 cm. Aortic valve mean gradient measures 9.0 mmHg. Aortic valve Vmax measures 2.14 m/s. FINDINGS  Left Ventricle: Left ventricular ejection fraction, by estimation, is 40 to 45%. The left ventricle has mildly decreased function. The left ventricle demonstrates global hypokinesis. The left ventricular internal cavity size was normal in size. There is  no left ventricular hypertrophy. The interventricular septum is flattened in diastole ('D' shaped left ventricle), consistent with right ventricular volume overload and abnormal (paradoxical) septal motion, consistent with RV pacemaker. Indeterminate diastolic filling due to E-A fusion. Normal left ventricular filling pressure. Right  Ventricle: The right ventricular size is severely enlarged. No increase in right ventricular wall thickness. Right ventricular systolic function is mildly reduced. There is mildly elevated pulmonary artery systolic pressure. The tricuspid regurgitant velocity is 3.00 m/s, and with an assumed right atrial pressure of 8 mmHg, the estimated right ventricular systolic pressure is 79.0 mmHg. Left Atrium: Left atrial size was severely dilated. Right Atrium: Right atrial size was severely dilated. Pericardium: Trivial pericardial effusion is present. Mitral Valve: The mitral valve is normal in structure. Mild mitral annular calcification. Mild mitral valve regurgitation, with centrally-directed jet. Tricuspid Valve: Marked tricuspid annulus dilation. The tricuspid valve is grossly normal. Tricuspid valve regurgitation is moderate to severe. The flow in the hepatic veins is reversed during ventricular systole. Aortic Valve: The aortic valve has been repaired/replaced. Aortic valve regurgitation is not visualized. Aortic valve mean gradient measures 9.0 mmHg. Aortic valve peak gradient measures 18.3 mmHg. Aortic valve area, by VTI measures 1.48 cm. There is a 29 mm Edwards Sapien prosthetic, stented (TAVR) valve present in the aortic position. Procedure Date: 11/09/2019. Echo findings are consistent with normal structure and function of the aortic valve prosthesis. Pulmonic Valve: The pulmonic valve was normal in structure. Pulmonic valve regurgitation is mild. Aorta: The aortic root is normal in size and structure. IAS/Shunts: No atrial level shunt detected by color flow Doppler.  LEFT VENTRICLE PLAX 2D LVIDd:  4.90 cm  Diastology LVIDs:         3.40 cm  LV e' lateral:   8.59 cm/s LV PW:         1.40 cm  LV E/e' lateral: 7.8 LV IVS:        1.10 cm  LV e' medial:    5.33 cm/s LVOT diam:     2.40 cm  LV E/e' medial:  12.5 LV SV:         56 LV SV Index:   33 LVOT Area:     4.52 cm  LEFT ATRIUM         Index LA diam:     5.50 cm 3.22 cm/m  AORTIC VALVE AV Area (Vmax):    1.42 cm AV Area (Vmean):   1.32 cm AV Area (VTI):     1.48 cm AV Vmax:           214.00 cm/s AV Vmean:          152.000 cm/s AV VTI:            0.379 m AV Peak Grad:      18.3 mmHg AV Mean Grad:      9.0 mmHg LVOT Vmax:         67.20 cm/s LVOT Vmean:        44.400 cm/s LVOT VTI:          0.124 m LVOT/AV VTI ratio: 0.33 MV E velocity: 66.80 cm/s  TRICUSPID VALVE                            TR Peak grad:   36.0 mmHg                            TR Vmax:        300.00 cm/s                             SHUNTS                            Systemic VTI:  0.12 m                            Systemic Diam: 2.40 cm Sanda Klein MD Electronically signed by Sanda Klein MD Signature Date/Time: 12/02/2019/11:16:39 AM    Final     Cardiac Studies   Echo 12/01/2019: IMPRESSIONS    1. Left ventricular ejection fraction, by estimation, is 40 to 45%. The  left ventricle has mildly decreased function. The left ventricle  demonstrates global hypokinesis. Indeterminate diastolic filling due to  E-A fusion. There is the interventricular  septum is flattened in diastole ('D' shaped left ventricle), consistent  with right ventricular volume overload.  2. Right ventricular systolic function is mildly reduced. The right  ventricular size is severely enlarged. There is mildly elevated pulmonary  artery systolic pressure. The estimated right ventricular systolic  pressure is 16.1 mmHg.  3. Left atrial size was severely dilated.  4. Right atrial size was severely dilated.  5. The mitral valve is normal in structure. Mild mitral valve  regurgitation.  6. Marked tricuspid annulus dilation. Tricuspid valve regurgitation is  moderate to severe.  7. The aortic valve has been repaired/replaced. Aortic valve  regurgitation  is not visualized. There is a 29 mm Edwards Sapien  prosthetic (TAVR) valve present in the aortic position. Procedure Date:  11/29/2019. Echo  findings are consistent with normal  structure and function of the aortic valve prosthesis. Aortic valve area,  by VTI measures 1.48 cm. Aortic valve mean gradient measures 9.0 mmHg.  Aortic valve Vmax measures 2.14 m/s.   Patient Profile     69 y.o. 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  Assessment & Plan    1. Acute/chronic combined systolic/diastolic heart failure: remains volume overloaded. CVP 14-16. Check co-oximetry this am. Lasix 60 mg IV BID. Follow creatinine closely. 2. Severe stage D2 aortic stenosis. S/P TAVR. Normal valve function by echo assessment. Back on warfarin/ASA. 3. Paroxysmal AF: beta blocker on hold secondary to hypotension, warfarin restarted. INR 2.0. 4. Acute/chronic kidney injury: creatinine trended up to 3.34 today. Remains volume overloaded. Suspect multifactorial with CKD, HF, contrast nephropathy, ATN from hypotension. 5. Acute/chronic anemia: received 2 u prbc's. No clear source of active bleeding. CT abd/pelvis negative for acute bleed. 6. Acute/chronic respiratory failure: right thoracentesis yesterday, clinically improved. Pulmonary edema on CXR on background of chronic lung disease. Diurese as tolerated. Currently on 5L O2 per [place].   Remains tenuous but improved from yesterday. Hopefully creatinine is nearing plateau and he will tolerate diuresis. Significant component of right heart failure. If co-ox low, consider milrinone and Advanced HF consultation.  The patient is critically ill with multiple organ systems failure and requires high complexity decision making for assessment  and support, frequent evaluation and titration of therapies, application of advanced monitoring technologies and extensive interpretation of multiple databases.   Critical Care Time devoted to patient care services described in this note is 35 minutes.   For questions or updates, please contact [place] Please consult [email or web address] for contact info under        Signed, Sherren Mocha, MD  12/03/2019, 7:24 AM

## 2019-12-03 NOTE — Progress Notes (Signed)
RT placed pt on CPAP home machine for the night on his home setting of 5 cmH2O w/6Lpm bled into the system. Pt requiring more oxygen than CPAP can provide, so [place] 4lpm was added for extra oxygenation. RT will continue to monitor.

## 2019-12-03 NOTE — Progress Notes (Signed)
Physical Therapy Treatment Patient Details Name: Justin Boone MRN: [id number] DOB: Oct 22, 1950 Today's Date: 12/03/2019    History of Present Illness Pt is a 69 year old male with history of A. 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 s/p right-sided thoracentesis for symptomatic right-sided pleural effusion at OSH. 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    PT Comments    Patient progressing slowly towards PT goals. Requires Mod A of 2 for bed mobility and min guard-Mod A for standing transfers with more assist needed when fatigued. Able to perform side stepping along side bed and marching in place with RW and min A for support. Sp02 ranged from 77-94% on 6L.min 02 [place], takes a long time to recover 02. Rn aware. Cues for pursed lip breathing. BP more stable today but continues to be orthostatic. See below Supine BP 100/54 Sitting BP 81/56 Standing BP 97/59 Supine BP post activity 86/44 (56) Pt reports mild feeling of weakness but no dizziness or feeling like he might pass out. Noted to have some tremoring sitting EOB initially which resolved. Will continue to follow and progress.   Follow Up Recommendations  Supervision/Assistance - 24 hour;Home health PT (pending improvement)     Equipment Recommendations  Other (comment) (TBA)    Recommendations for Other Services       Precautions / Restrictions Precautions Precautions: Fall Precaution Comments: watch 02 and BP (orthostatic) Restrictions Weight Bearing Restrictions: No    Mobility  Bed Mobility Overal bed mobility: Needs Assistance Bed Mobility: Supine to Sit;Sit to Supine     Supine to sit: Mod assist;+2 for physical assistance;HOB elevated     General bed mobility comments: assist of 2 to elevate trunk to get to EOB with pt  reaching for therapist's arms. Able to bring LEs into bed to return to supine and assist with pushing through LEs.  Transfers Overall transfer level: Needs assistance Equipment used: Rolling walker (2 wheeled) Transfers: Sit to/from Stand Sit to Stand: Min guard;Mod assist         General transfer comment: Initially Min guard to stand from EOB digressing to mod A after multiple attempts. Cues for hand placement and for upright. Stabilizing RW per request. Stood from EOB x7.  Ambulation/Gait Ambulation/Gait assistance: Min guard;Min assist Gait Distance (Feet): 15 Feet (laterally) Assistive device: Rolling walker (2 wheeled)   Gait velocity: decreased   General Gait Details: Able to take a steps along side the best in both directions 3 times; listening to music. Sp02 dropped to high 70s, low 80s on 6L/min 02 [place]. Cues for pursed lip breathing.   Stairs             Wheelchair Mobility    Modified Rankin (Stroke Patients Only)       Balance Overall balance assessment: Needs assistance Sitting-balance support: No upper extremity supported;Feet supported Sitting balance-Leahy Scale: Fair     Standing balance support: During functional activity Standing balance-Leahy Scale: Poor Standing balance comment: relies on RW for support                            Cognition Arousal/Alertness: Awake/alert Behavior During Therapy: WFL for tasks assessed/performed Overall Cognitive Status: Impaired/Different from baseline Area of Impairment: Memory  Memory: Decreased short-term memory Following Commands: Follows one step commands with increased time       General Comments: Seems clearer today; noted to have some STM deficits when talking with son about grandchildren. Multi modal cues at times.      Exercises General Exercises - Lower Extremity Long Arc Quad: AROM;Both;5 reps;Seated Other Exercises Other Exercises: 5x STS from EOB  with Min guard-Mod A in 2 minutes.    General Comments General comments (skin integrity, edema, etc.): Son present in middle of session; supine BP100/54, sitting BP 81/56, standing BP 97/59, supine BP post activity 86/44 (56). Sp02 ranged from ~77-94% on 6L/min 02 [place].      Pertinent Vitals/Pain Pain Assessment: Faces Faces Pain Scale: Hurts little more Pain Location: right elbow with movement - arthris Pain Descriptors / Indicators: Guarding;Grimacing;Discomfort Pain Intervention(s): Repositioned;Monitored during session    Home Living                 Additional Comments: motivated by grandchildren    Prior Function            PT Goals (current goals can now be found in the care plan section) Progress towards PT goals: Progressing toward goals    Frequency    Min 3X/week      PT Plan Current plan remains appropriate    Co-evaluation PT/OT/SLP Co-Evaluation/Treatment: Yes Reason for Co-Treatment: For patient/therapist safety;To address functional/ADL transfers;Complexity of the patient's impairments (multi-system involvement) PT goals addressed during session: Mobility/safety with mobility;Strengthening/ROM        AM-PAC PT "6 Clicks" Mobility   Outcome Measure  Help needed turning from your back to your side while in a flat bed without using bedrails?: None Help needed moving from lying on your back to sitting on the side of a flat bed without using bedrails?: A Lot Help needed moving to and from a bed to a chair (including a wheelchair)?: A Lot Help needed standing up from a chair using your arms (e.g., wheelchair or bedside chair)?: A Lot Help needed to walk in hospital room?: A Little Help needed climbing 3-5 steps with a railing? : Total 6 Click Score: 14    End of Session Equipment Utilized During Treatment: Gait belt Activity Tolerance: Treatment limited secondary to medical complications (Comment) (low 02) Patient left: in bed;with call bell/phone  within reach;with bed alarm set;with family/visitor present Nurse Communication: Mobility status;Other (comment) (02 drop) PT Visit Diagnosis: Muscle weakness (generalized) (M62.81);Other abnormalities of gait and mobility (R26.89)     Time: 6644-0347 PT Time Calculation (min) (ACUTE ONLY): 30 min  Charges:  $Therapeutic Activity: 8-22 mins                     Marisa Severin, PT, DPT Acute Rehabilitation Services Pager [phone] Office [phone]       Marguarite Arbour A Sabra Heck 12/03/2019, 3:18 PM

## 2019-12-03 NOTE — Consult Note (Addendum)
Advanced Heart Failure Team Consult Note   Primary Physician: Verdell Carmine., MD PCP-Cardiologist:  Sherren Mocha, MD  Reason for Consultation: Cardiogenic Shock   HPI:    Justin Boone is seen today for evaluation of cardiogenic shock at the request of Dr Burt Knack.   Justin Boone is 69 year old with a history of chronic heart failure with moderate left ventricular systolic dysfunction (EF 67%), CAD (PCI-RCA 07/2019), severe aortic stenosis, history of recurrent persistent atrial fibrillation and atrial flutter with tachycardia-bradycardia syndrome,s/pCRT-P ([employer]tr[employer], [place], April 2021), COPD, hypertension, type II DM,and CKD 3b.   Admitted with 12/03/2019 from [facility] in [place] with A/C heart failure. Transferred to [facility] for cardiology/nephrology consultation. Optimized prior to TAVR and underwentTAVR 11/21/2019. Post operative course complicated by presyncope, respiratory failure,  and hypotension. Post  Op day 1 & 2 had presyncope and hypotension. Was on norepi but now off.    On 6/24 he was transferred to ICU. CCM consulted for hypotension and respiratory failure. Had central line placed and R thoracentesis with 1.1 liters removed. He has been diuresing with 60 mg IV lasix. Sluggish diuresis.  Hgb was down to 7.6. CT abd completed for possible RP bleed. No active bleeding. Received 2UPRBCs.   Todays CO-OX 47%--> . Milrinone 0.25 mcg started.   Remains SOB with minimal exertion. Complaining of leg swelling.   12/01/19 Echo EF 40-45%  RV severely enlarged mild ly reduced.  R/L atria severely dilated. RV  Review of Systems: [y] = yes, [ ]  = no   . General: Weight gain [Y ]; Weight loss [ ] ; Anorexia [ ] ; Fatigue [Y ]; Fever [ ] ; Chills [ ] ; Weakness [ ]   . Cardiac: Chest pain/pressure [ ] ; Resting SOB [ ] ; Exertional SOB [Y ]; Orthopnea [ Y]; Pedal Edema [ ] ; Palpitations [ ] ; Syncope [ ] ; Presyncope [Y ]; Paroxysmal nocturnal dyspnea[ ]   . Pulmonary: Cough [ ] ; Wheezing[Y  ]; Hemoptysis[ ] ; Sputum [ ] ; Snoring [ ]   . GI: Vomiting[ ] ; Dysphagia[ ] ; Melena[ ] ; Hematochezia [ ] ; Heartburn[ ] ; Abdominal pain [ ] ; Constipation [ ] ; Diarrhea [ ] ; BRBPR [ ]   . GU: Hematuria[ ] ; Dysuria [ ] ; Nocturia[ ]   . Vascular: Pain in legs with walking [ ] ; Pain in feet with lying flat [ ] ; Non-healing sores [ ] ; Stroke [ ] ; TIA [ ] ; Slurred speech [ ] ;  . Neuro: Headaches[ ] ; Vertigo[ ] ; Seizures[ ] ; Paresthesias[ ] ;Blurred vision [ ] ; Diplopia [ ] ; Vision changes [ ]   . Ortho/Skin: Arthritis [ ] ; Joint pain [Y ]; Muscle pain [ ] ; Joint swelling [ ] ; Back Pain [Y ]; Rash [ ]   . Psych: Depression[ ] ; Anxiety[ ]   . Heme: Bleeding problems [ ] ; Clotting disorders [ ] ; Anemia [ ]   . Endocrine: Diabetes [ ] ; Thyroid dysfunction[ ]   Home Medications Prior to Admission medications   Medication Sig Start Date End Date Taking? Authorizing Provider  albuterol (PROVENTIL) (2.5 MG/3ML) 0.083% nebulizer solution Inhale 3 mLs into the lungs every 6 (six) hours as needed for shortness of breath or wheezing. 08/27/19  Yes [provider]  albuterol (VENTOLIN HFA) 108 (90 Base) MCG/ACT inhaler Inhale 2 puffs into the lungs every 6 (six) hours as needed for wheezing or shortness of breath.   Yes [provider]  allopurinol (ZYLOPRIM) 300 MG tablet Take 300 mg by mouth daily. 08/20/19  Yes [provider]  amiodarone (PACERONE) 400 MG tablet Take 1 tablet (400mg ) by  mouth twice daily x 2 weeks Then decrease to 1 tablet (400mg ) daily x 4 weeks Patient taking differently: Take 400 mg by mouth daily.  10/06/19  Yes Deboraha Sprang, MD  atorvastatin (LIPITOR) 80 MG tablet Take 80 mg by mouth daily. 08/20/19  Yes [provider]  clopidogrel (PLAVIX) 75 MG tablet Take 75 mg by mouth daily. 08/12/19  Yes [provider]  DULoxetine (CYMBALTA) 60 MG capsule Take 60 mg by mouth daily. 07/30/19  Yes [provider]  furosemide (LASIX) 80 MG tablet Take 1 tablet  (80 mg total) by mouth 2 (two) times daily. 09/28/19  Yes Shelly Coss, MD  metoprolol succinate (TOPROL-XL) 50 MG 24 hr tablet Take 50 mg by mouth daily. 08/20/19  Yes [provider]  pantoprazole (PROTONIX) 40 MG tablet Take 40 mg by mouth daily.   Yes [provider]  tamsulosin (FLOMAX) 0.4 MG CAPS capsule Take 0.4 mg by mouth daily. 07/30/19  Yes [provider]  TRELEGY ELLIPTA 100-62.5-25 MCG/INH AEPB Inhale 1 puff into the lungs daily. 08/27/19  Yes [provider]  warfarin (COUMADIN) 2 MG tablet Take 2 mg by mouth See admin instructions. Tuesday, Thursday, Saturday, and Sunday evenings   Yes [provider]  warfarin (COUMADIN) 3 MG tablet Take 1 tablet (3 mg total) by mouth daily. Please cancel the earlier prescription for aspirin Patient taking differently: Take 3 mg by mouth every Monday, Wednesday, and Friday.  09/28/19 11/27/19 Yes Shelly Coss, MD    Past Medical History: Past Medical History:  Diagnosis Date  . A-fib ([place])   . Bradycardia 09/17/2019  . CHF (congestive heart failure) ([place])   . Diabetes mellitus type II, controlled ([place])   . HTN (hypertension)   . 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 BIV PACEMAKER INSERTION CRT-P N/A 09/22/2019   Procedure: BIV PACEMAKER INSERTION CRT-P;  Surgeon: Deboraha Sprang, MD;  Location: [facility];  Service: Cardiovascular;  Laterality: N/A;  . CHOLECYSTECTOMY    . MULTIPLE EXTRACTIONS WITH ALVEOLOPLASTY Bilateral 11/12/2019   Procedure: Extraction of tooth #'s 11,13, and 14 with alveoloplasty and gross debridement of remaining dentition;  Surgeon: Lenn Cal, DDS;  Location: [facility];  Service: Oral Surgery;  Laterality: Bilateral;  . SHOULDER SURGERY    . TEE WITHOUT  CARDIOVERSION N/A 12/05/2019   Procedure: TRANSESOPHAGEAL ECHOCARDIOGRAM (TEE);  Surgeon: Sherren Mocha, MD;  Location: [facility];  Service: Open Heart Surgery;  Laterality: N/A;  . TRANSCATHETER AORTIC VALVE REPLACEMENT, TRANSFEMORAL N/A 11/18/2019   Procedure: TRANSCATHETER AORTIC VALVE REPLACEMENT, TRANSFEMORAL;  Surgeon: Sherren Mocha, MD;  Location: [facility];  Service: Open Heart Surgery;  Laterality: N/A;    Family History: Family History  Problem Relation Age of Onset  . Heart disease Mother   . Heart disease Father   . Stroke Father     Social History: Social History   Socioeconomic History  . Marital status: Single    Spouse name: Not on file  . Number of children: Not on file  . Years of education: Not on file  . Highest education level: Not on file  Occupational History  . Not on file  Tobacco Use  . Smoking status: Current Every Day Smoker    Packs/day: 0.50  Years: 50.00    Pack years: 25.00    Types: Cigarettes  . Smokeless tobacco: Never Used  Substance and Sexual Activity  . Alcohol use: Never  . Drug use: Never  . Sexual activity: Not on file  Other Topics Concern  . Not on file  Social History Narrative  . Not on file   Social Determinants of Health   Financial Resource Strain:   . Difficulty of Paying Living Expenses:   Food Insecurity:   . Worried About Charity fundraiser in the Last Year:   . Arboriculturist in the Last Year:   Transportation Needs:   . Film/video editor (Medical):   Marland Kitchen Lack of Transportation (Non-Medical):   Physical Activity:   . Days of Exercise per Week:   . Minutes of Exercise per Session:   Stress:   . Feeling of Stress :   Social Connections:   . Frequency of Communication with Friends and Family:   . Frequency of Social Gatherings with Friends and Family:   . Attends Religious Services:   . Active Member of Clubs or Organizations:   . Attends Archivist Meetings:   Marland Kitchen  Marital Status:     Allergies:  Allergies  Allergen Reactions  . Contrast Media [Iodinated Diagnostic Agents]   . Sulfa Antibiotics     Objective:    Vital Signs:   Temp:  [97.5 F (36.4 C)-98.6 F (37 C)] 97.7 F (36.5 C) (06/25 0337) Pulse Rate:  [60-85] 64 (06/25 0900) Resp:  [13-27] 20 (06/25 0900) BP: (81-126)/(55-99) 112/60 (06/25 0900) SpO2:  [88 %-97 %] 95 % (06/25 0900) Weight:  [83.6 kg] 83.6 kg (06/25 0543) Last BM Date: 11/29/19  Weight change: Filed Weights   12/01/19 0500 12/02/19 0516 12/03/19 0543  Weight: 82.8 kg 83.1 kg 83.6 kg    Intake/Output:   Intake/Output Summary (Last 24 hours) at 12/03/2019 1026 Last data filed at 12/03/2019 0914 Gross per 24 hour  Intake 960.65 ml  Output 875 ml  Net 85.65 ml      Physical Exam   CVP 17 -18  General:  Sitting in the chair.  No resp difficulty HEENT: normal Neck: supple. JVP to jaw . Carotids 2+ bilat; no bruits. No lymphadenopathy or thyromegaly appreciated. RIJ  Cor: PMI nondisplaced. Regular rate & rhythm. No rubs, gallops or murmurs. Lungs: [place] throughout on 5 liters of [place] Abdomen: soft, nontender, nondistended. No hepatosplenomegaly. No bruits or masses. Good bowel sounds. Extremities: no cyanosis, clubbing, rash, R and LLE 2-3+ edema. R groin ecchymotic.  Neuro: alert & orientedx3, cranial nerves grossly intact. moves all 4 extremities w/o difficulty. Affect pleasant   Telemetry   A-V paced 60s   EKG   n/a   Labs   Basic Metabolic Panel: Recent Labs  Lab 11/29/19 0842 11/29/19 0842 12/06/2019 0511 12/06/2019 0511 11/16/2019 0628 11/29/2019 1637 11/18/2019 1637 12/04/2019 1809 12/01/19 0233 12/02/19 0347 12/02/19 1254 12/02/19 1441 12/03/19 0258  NA 138   < > 139   < >   < > 139   < > 139 139 137 135 137 136  K 5.7*   < > 5.9*   < >   < > 5.0   < > 5.0 5.3* 5.3* 5.4* 5.4* 4.8  CL 104   < > 104  --    < > 102  --  104 103 103  --   --  101  CO2 26  --  22  --   --   --   --   --  24  23  --   --  23  GLUCOSE 117*   < > 129*  --    < > 126*  --  123* 161* 137*  --   --  128*  BUN 57*   < > 59*  --    < > 60*  --  60* 69* 82*  --   --  83*  CREATININE 2.09*   < > 2.10*  --    < > 2.10*  --  2.10* 2.43* 3.05*  --   --  3.34*  CALCIUM 8.6*   < > 8.7*   < >  --   --   --   --  8.2* 8.2*  --   --  7.9*  MG  --   --   --   --   --   --   --   --  2.6*  --   --   --   --    < > = values in this interval not displayed.    Liver Function Tests: Recent Labs  Lab 12/03/19 0842  AST 93*  ALT 144*  ALKPHOS 122  BILITOT 1.8*  PROT 5.0*  ALBUMIN 2.2*   No results for input(s): LIPASE, AMYLASE in the last 168 hours. No results for input(s): AMMONIA in the last 168 hours.  CBC: Recent Labs  Lab 11/27/19 0612 11/27/19 0612 11/18/2019 0511 11/16/2019 1539 12/01/19 0233 12/01/19 0233 12/02/19 0347 12/02/19 0347 12/02/19 1215 12/02/19 1254 12/02/19 1441 12/02/19 1530 12/03/19 0258 12/03/19 0842  WBC 10.0  --  11.1*  --  8.1  --  16.4*  --   --   --   --   --  12.8*  --   HGB 8.6*   < > 9.2*   < > 7.9*   < > 7.0*   < > 7.0* 7.8* 9.5* 7.6* 8.0*  --   HCT 28.6*   < > 30.3*   < > 26.0*   < > 22.7*   < > 23.8* 23.0* 28.0* 24.6* 24.9*  --   MCV 97.3  --  97.7  --  97.0  --  97.8  --   --   --   --   --  92.6  --   PLT 171   < > 168  --  131*  --  129*  --   --   --   --   --  90* 87*   < > = values in this interval not displayed.    Cardiac Enzymes: No results for input(s): CKTOTAL, CKMB, CKMBINDEX, TROPONINI in the last 168 hours.  BNP: BNP (last 3 results) Recent Labs    11/17/2019 2002 11/29/19 1513  BNP 3,282.1* 2,503.4*    ProBNP (last 3 results) No results for input(s): PROBNP in the last 8760 hours.   CBG: Recent Labs  Lab 12/01/19 1809 12/01/19 2122 12/02/19 0557 12/02/19 1129 12/03/19 0702  GLUCAP 141* 126* 153* 142* 129*    Coagulation Studies: Recent Labs    12/02/19 0347 12/03/19 0258 12/03/19 0842  LABPROT 18.0* 22.2* 22.8*  INR 1.6*  2.0* 2.1*     Imaging   CT ABDOMEN PELVIS WO CONTRAST  Result Date: 12/02/2019 CLINICAL DATA:  69 year old male with concern for retroperitoneal bleed. EXAM: CT ABDOMEN AND PELVIS WITHOUT CONTRAST TECHNIQUE: Multidetector CT imaging of the abdomen  and pelvis was performed following the standard protocol without IV contrast. COMPARISON:  CT abdomen pelvis dated 04/03/2019. FINDINGS: Evaluation of this exam is limited in the absence of intravenous contrast. Lower chest: Partially visualized small left and moderate right pleural effusions. There is complete consolidative changes of the visualized right lower lobe with air bronchogram. There is partial compressive atelectasis of the left lower lobe. Patchy areas of ground-glass opacity throughout the remainder of the visualized lungs. Findings concerning for pneumonia. Clinical correlation and follow-up to resolution recommended. A partially visualized 2 cm rounded structure in the region of the right hilum (1/2) may represent extension of ascites. A hilar lymph node or mass is not entirely excluded. Follow-up with chest CT after resolution of current symptoms recommended. There is mild cardiomegaly. Partially visualized aortic valve repair. There is hypoattenuation of the cardiac blood pool suggestive of anemia. Clinical correlation is recommended. No intra-abdominal free air or free fluid. Hepatobiliary: Minimal irregularity of the liver contour may represent early changes of cirrhosis. Clinical correlation is recommended. No intrahepatic biliary ductal dilatation. Cholecystectomy. No retained calcified stone noted in the central CBD. Pancreas: Unremarkable. No pancreatic ductal dilatation or surrounding inflammatory changes. Spleen: Normal in size without focal abnormality. Adrenals/Urinary Tract: The adrenal glands are unremarkable. Mild bilateral renal parenchyma atrophy. There is no hydronephrosis or nephrolithiasis on either side. Small bilateral renal  exophytic lesions are not characterized but may represent cysts. The visualized ureters appear unremarkable. High attenuating content within the urinary bladder likely represents excretion of recently administered IV contrast. Stomach/Bowel: There is moderate stool throughout the colon. There is no bowel obstruction or active inflammation. The appendix is normal as visualized. Vascular/Lymphatic: Advanced aortoiliac atherosclerotic disease. The IVC is unremarkable. No portal venous gas. There is no adenopathy. Reproductive: The prostate and seminal vesicles are grossly unremarkable. Other: Diffuse subcutaneous edema and anasarca. No fluid collection or hematoma. No retroperitoneal fluid or hematoma. Musculoskeletal: Osteopenia with degenerative changes of the spine and left hip. Total right hip arthroplasty. No acute osseous pathology. IMPRESSION: 1. No acute intra-abdominal or pelvic pathology. No intraperitoneal or retroperitoneal fluid collection or hematoma. 2. Partially visualized bilateral pleural effusions with findings concerning for multifocal pneumonia. Clinical correlation and follow-up to resolution recommended. 3. Diffuse subcutaneous edema and anasarca. 4. Aortic Atherosclerosis (ICD10-I70.0). Electronically Signed   By: Anner Crete M.D.   On: 12/02/2019 17:44   DG CHEST PORT 1 VIEW  Result Date: 12/02/2019 CLINICAL DATA:  Thoracentesis. EXAM: PORTABLE CHEST 1 VIEW COMPARISON:  A 1302 hours CT chest 2520. FINDINGS: Patient is slightly rotated. Trachea is midline. Heart is enlarged, stable. Pacemaker lead tips are stable in position. Small residual right pleural effusion after thoracentesis. No pneumothorax. Diffuse mixed interstitial and airspace opacification persists. IMPRESSION: 1. Small residual right pleural effusion after thoracentesis. No pneumothorax. 2. Severe pulmonary edema versus multilobar pneumonia. Electronically Signed   By: Lorin Picket M.D.   On: 12/02/2019 15:57   DG  Chest Port 1 View  Result Date: 12/02/2019 CLINICAL DATA:  69 year old male with history of syncopal event. EXAM: PORTABLE CHEST 1 VIEW COMPARISON:  Chest x-ray 11/25/2019. FINDINGS: Lung volumes are normal. Widespread but patchy airspace consolidation is noted throughout the lungs bilaterally, most confluent throughout the right mid to lower lung. Moderate right pleural effusion. No definite left pleural effusion. No pneumothorax. Cephalization of the pulmonary vasculature with some indistinctness of interstitial markings. Mild cardiomegaly. Upper mediastinal contours are within normal limits. Aortic atherosclerosis. Left-sided biventricular pacemaker device in place with lead tips projecting  over the expected location of the right atrium, right ventricle and left ventricle via the coronary sinus and coronary veins. IMPRESSION: 1. Worsening multilobar bilateral pneumonia, most severe throughout the right mid to lower lung. 2. Moderate right pleural effusion. 3. In addition, there is cardiomegaly with evidence of mild interstitial pulmonary edema, which may suggest superimposed congestive heart failure. 4. Aortic atherosclerosis. Electronically Signed   By: Vinnie Langton M.D.   On: 12/02/2019 14:33      Medications:     Current Medications: . sodium chloride   Intravenous Once  . amiodarone  400 mg Oral Daily  . atorvastatin  80 mg Oral q1800  . Chlorhexidine Gluconate Cloth  6 each Topical Daily  . Chlorhexidine Gluconate Cloth  6 each Topical Q0600  . clopidogrel  75 mg Oral Daily  . DULoxetine  60 mg Oral Daily  . furosemide  60 mg Intravenous Q6H  . mouth rinse  15 mL Mouth Rinse BID  . mometasone-formoterol  2 puff Inhalation BID  . multivitamin with minerals  1 tablet Oral Daily  . nicotine  21 mg Transdermal Daily  . pantoprazole  40 mg Oral Daily  . sodium chloride flush  10-40 mL Intracatheter Q12H  . sodium chloride flush  3 mL Intravenous Q12H  . tamsulosin  0.4 mg Oral QPC  breakfast     Infusions: . sodium chloride    . milrinone 0.25 mcg/kg/min (12/03/19 0914)  . norepinephrine (LEVOPHED) Adult infusion Stopped (12/02/19 1450)  . sodium chloride irrigation         Patient Profile   Justin Lange is 69 year old with a history of chronic heart failure with moderate left ventricular systolic dysfunction (EF 15%), CAD (PCI-RCA 07/2019), severe aortic stenosis, history of recurrent persistent atrial fibrillation and atrial flutter with tachycardia-bradycardia syndrome,s/pCRT-P ([employer], [place], April 2021), COPD, hypertension, type II DM,and CKD 3b.   S/P TAVR- Post operative course complicated by hypotension, volume overload, and presyncope.    Assessment/Plan   1. A/C Biventricular HF-->Cardiogenic Shock  CRT-P [employer]tronic, ECHO EF ~ 45% with RV reduced.  -CO-OX < 50%. Started on milrinone 0.25 mcg.  - Marked volume overload, CVP 17-18 . - Had 60 mg IV lasix this morning. Add lasix drip 10 mg per hour. Add ted hose.  - No bb with shock.   2. AS--> S/P TAVR on 6/23  3. Acute Hypoxic Respiratory Failure On CPAP - now on 5 liters with stable saturations.   4. AKI Creatinine baseline ~ 2.-2.2 Todays creatinine 3.34 Sluggish urine output. He does not want dialysis. Milrinone added earlier.   5. Anemia Hgb 7.6 received 2 URPBCs  Todays hgb 8  CT no hematoma  6. Pleural Effusion  S/P Thoracentesis 6/24 on R with 1.1 liter removed.    Length of Stay: [place], NP  12/03/2019, 10:26 AM  Advanced Heart Failure Team Pager [phone] (M-F; [place])  Please contact [facility] for night-coverage after hours (4p -7a ) and weekends on [email or web address]  Patient seen with NP, agree with the above note.   Patient has history of atrial flutter/fibrillation with tachy-brady syndrome with MDT CRT-P and CAD with PCI to RCA in 2/21 was admitted with acute CHF exacerbation and found to have severe AS.  He had TAVR 11/09/2019.  Post-op course complicated by  RV failure, hypotension, AKI on CKD stage 3 as well as anemia.   Most recent echo was reviewed, EF 40-45% with severely dilated RV with moderate  systolic dysfunction.  RV function looked worse than pre-op.   Today, he is volume overloaded with CVP 18 and low co-ox at 53%.  Milrinone 0.25 started with co-ox up to 57%.  Creatinine up to 3.3 from baseline around 2.   General: NAD Neck: JVP 14-16 cm, no thyromegaly or thyroid nodule.  Lungs: Decreased at bases.  CV: Nondisplaced PMI.  Heart regular S1/S2, no S3/S4, 2/6 SEM RUSB.  1+ ankle edema.  No carotid bruit.  Normal pedal pulses.  Abdomen: Soft, nontender, no hepatosplenomegaly, no distention.  Skin: Intact without lesions or rashes.  Neurologic: Alert and oriented x 3.  Psych: Normal affect. Extremities: No clubbing or cyanosis.  HEENT: Normal.   1. Acute on chronic systolic CHF with prominent RV dysfunction: Has MDT CRT-P device.  Most recent echo with EF 40-45% with severely dilated RV with moderate systolic dysfunction.  RV function looks worse than pre-op. Co-ox low earlier today, improved to 57% on milrinone.  CVP 18. Cause of worsening RV is uncertain, some pre-op RV failure now worse.  Had RV stent placed 2/21.  No chest pain reported but cannot rely on ECG changes due to pacing.   - Continue milrinone 0.25 mcg/kg/min for RV support.  - Lasix 40 mg IV x 1 then 10 mg/hr.   2. AKI on CKD stage 3: Baseline creatinine 2, now up to 3.3.  Suspect multifactorial => cardiorenal, contrast, hypotension.  - Support CO with milrinone.  - Diuresis, follow creatinine closely.  3. CAD: s/p PCI to RCA in 2/21.  - Continue Plavix, statin.  - Will send off HS-troponin.  Would expect some elevation due to demand ischemia and AKI.  If markedly high, would be concerned for RCA thrombosis with RV infarction.  4. Atrial fibrillation: He is currently a-paced.  - Continue amiodarone 400 mg daily.  - INR 2 but holding anticoagulation for now with concern  for bleeding.  5. Anemia: Hgb 7.6 today, getting blood.  No source of bleeding found.  No RP hematoma on CT.  - Transfuse hgb < 8.  6. Thrombocytopenia: Mild, follow. Suspect due to critical illness.  7. Severe AS: S/p TAVR. Bioprosthetic aortic valve stable on post-op echo.   Loralie Champagne 12/03/2019 3:23 PM

## 2019-12-03 NOTE — Progress Notes (Signed)
NAME:  Justin Boone, MRN:  [id number], DOB:  07/11/1950, LOS: 69 ADMISSION DATE:  12/05/2019, CONSULTATION DATE:  12/02/2010 REFERRING MD:  Dr. Sallyanne Kuster , CHIEF COMPLAINT:  Hypotension and hypoxia  Brief History   Patient is a 69 year old male who transferred from outside facility for consultation of cardiology and nephrology.  Underwent TAVR due to severe aortic stenosis on 6/22.  1 days following TAVR patient seen with presyncopal episodes.  On 6/24 patient again seen with presyncopal episode and development of hypoxia with persistent hypotension necessitating transfer to ICU and consult to PCCM  History of present illness   Justin Boone is a 69 year old male with a past medical history significant for atrial fibrillation on anticoagulation with Coumadin, bradycardia status post pacemaker, systolic congestive heart failure, type 2 diabetes, hypertension, severe aortic stenosis now status post TAVR, chronic kidney disease stage III, gout, COPD, obstructive sleep apnea on, hyperlipidemia, CAD status post PCI, and chronic tobacco abuse who initially presented with worsening dyspnea to outside facility.  On 6/12 patient was transferred to [name] for cardiology and nephrology consultations.  Prior to transfer patient had CT chest done without contrast on 11/15/2019 that revealed right greater than left pleural effusion with compressive volume loss and underwent thoracentesis on 8/2 with 1.1 L removed.  On transfer to [name] he was medically optimized from cardiology and nephrology standpoint and underwent TAVR on 6/22.  Postop day 1 patient seen with episode of presyncope felt secondary to hypovolemia for which he received IV hydration.  The following day, postop day 2, patient had 2 additional episodes of presyncope with development of persistent hypotension and hypoxia precipitating cardiology consultation to critical care and transfer to ICU.  Concern for retroperitoneal bleed as well given drop  in hemoglobin.  Past Medical History  Chronic atrial fibrillation anticoagulated on Coumadin Bradycardia status post pacemaker placement Systolic congestive heart failure Type 2 diabetes Hypertension Severe aortic stenosis now status post TAVR Chronic kidney disease stage III Gout COPD  Daily tobacco abuse Obstructive sleep apnea with nocturnal CPAP, reports compliance Hyperlipidemia CAD status post PCI  Significant Hospital Events   Admitted 6/12 from outside facility  Consults:  Cardiology Nephrology PCCM Cardiothoracic surgery Dental medicine  Procedures:  Dental extraction of teeth 11, 13, and 14 6/16 TAVR 6/21  Significant Diagnostic Tests:  Chest x-ray 6/24 > moderate to severe right pleural effusion  Micro Data:  Covid 6/12 > negative Surgical PCR 6/16 > negative MRSA PCR screening 6/21 > negative  Antimicrobials:  None  Interim history/subjective:  More awake Breathing a little better Remains weak 700 uop Cr still a bit up  Objective   Blood pressure 119/62, pulse 65, temperature 97.7 F (36.5 C), temperature source Oral, resp. rate (!) 21, height 5\' 6"  (1.676 m), weight 83.6 kg, SpO2 95 %. CVP:  [11 mmHg-19 mmHg] 16 mmHg      Intake/Output Summary (Last 24 hours) at 12/03/2019 0745 Last data filed at 12/03/2019 0235 Gross per 24 hour  Intake 1034.5 ml  Output 725 ml  Net 309.5 ml   Filed Weights   12/01/19 0500 12/02/19 0516 12/03/19 0543  Weight: 82.8 kg 83.1 kg 83.6 kg    Examination: GEN: no acute distress sitting in chair HEENT: MMM, neck veins up CV: RRR, +SEM, ext warm PULM:  Diminished bases with crackles, expiratory wheezing at apices GI: Soft, hypoactive BS, has bruising along groin EXT: marked anasarca NEURO: has tremor of right arm, chronic, extremely weak PSYCH: Flat affect, poor insight  SKIN: appears jaundiced to me    Resolved Hospital Problem list     Assessment & Plan:   Shock: cardiogenic +/- hemorrhagic,  improved, Global body volume overload, Acute on chronic kidney injury S/p TAVR, Afib, baseline biventricular failure- RV probably related more to COPD - Check Co-ox, if still low would give another unit of blood and start inotropes - Push diuresis, may end up needing HD  Dropping platelets, rising INR- did get coumadin on 6/23 but should look for hemolysis as well - DIC and hemolysis panels - Iron panel  Question jaundice- denies etoh use - check LFTs  Severe muscular deconditioning- PT and progressive mobility  Anemia, flank bruising- monitor clinically, no significant hematoma on CT  COPD not in flare- continue dulera, d/c pulmicort   Best practice:  Diet: Cardiac Pain/Anxiety/Delirium protocol (if indicated): As needed VAP protocol (if indicated): Not applicable DVT prophylaxis: SCDs GI prophylaxis: PPI Glucose control: SSI Mobility: Bedrest Code Status: Full code Family Communication: Per primary Disposition: ICU  The patient is critically ill with multiple organ systems failure and requires high complexity decision making for assessment and support, frequent evaluation and titration of therapies, application of advanced monitoring technologies and extensive interpretation of multiple databases. Critical Care Time devoted to patient care services described in this note independent of APP/resident time (if applicable)  is 35 minutes.   Erskine Emery MD [facility] 12/03/2019 7:52 AM Personal pager: [phone] If unanswered, please page CCM On-call: [phone]

## 2019-12-03 NOTE — Progress Notes (Signed)
CRITICAL VALUE ALERT  Critical Value:  Troponin 107 Date & Time Notied:  12/03/2019 1640  Provider Notified: Paged Amy NP and also Dr. Sallyanne Kuster Orders Received/Actions taken: NO new orders at this time

## 2019-12-03 NOTE — Progress Notes (Signed)
Troponin of 107 relayed to Dr. Aundra Dubin which he feels is low level and no specific further change in plan needed. Lander Eslick PA-C

## 2019-12-04 ENCOUNTER — Inpatient Hospital Stay (HOSPITAL_COMMUNITY): Payer: Medicare HMO

## 2019-12-04 LAB — COOXEMETRY PANEL
Carboxyhemoglobin: 1.5 % (ref 0.5–1.5)
Carboxyhemoglobin: 2.2 % — ABNORMAL HIGH (ref 0.5–1.5)
Methemoglobin: 0.8 % (ref 0.0–1.5)
Methemoglobin: 1.2 % (ref 0.0–1.5)
O2 Saturation: 53.7 %
O2 Saturation: 70.8 %
Total hemoglobin: 7.5 g/dL — ABNORMAL LOW (ref 12.0–16.0)
Total hemoglobin: 7.5 g/dL — ABNORMAL LOW (ref 12.0–16.0)

## 2019-12-04 LAB — EXPECTORATED SPUTUM ASSESSMENT W GRAM STAIN, RFLX TO RESP C

## 2019-12-04 LAB — POCT I-STAT 7, (LYTES, BLD GAS, ICA,H+H)
Acid-Base Excess: 2 mmol/L (ref 0.0–2.0)
Bicarbonate: 25.7 mmol/L (ref 20.0–28.0)
Calcium, Ion: 1.12 mmol/L — ABNORMAL LOW (ref 1.15–1.40)
HCT: 25 % — ABNORMAL LOW (ref 39.0–52.0)
Hemoglobin: 8.5 g/dL — ABNORMAL LOW (ref 13.0–17.0)
O2 Saturation: 82 %
Patient temperature: 98.8
Potassium: 3.9 mmol/L (ref 3.5–5.1)
Sodium: 135 mmol/L (ref 135–145)
TCO2: 27 mmol/L (ref 22–32)
pCO2 arterial: 36.2 mmHg (ref 32.0–48.0)
pH, Arterial: 7.459 — ABNORMAL HIGH (ref 7.350–7.450)
pO2, Arterial: 44 mmHg — ABNORMAL LOW (ref 83.0–108.0)

## 2019-12-04 LAB — CBC
HCT: 23.6 % — ABNORMAL LOW (ref 39.0–52.0)
Hemoglobin: 7.5 g/dL — ABNORMAL LOW (ref 13.0–17.0)
MCH: 29.2 pg (ref 26.0–34.0)
MCHC: 31.8 g/dL (ref 30.0–36.0)
MCV: 91.8 fL (ref 80.0–100.0)
Platelets: 78 10*3/uL — ABNORMAL LOW (ref 150–400)
RBC: 2.57 MIL/uL — ABNORMAL LOW (ref 4.22–5.81)
RDW: 18.3 % — ABNORMAL HIGH (ref 11.5–15.5)
WBC: 14.3 10*3/uL — ABNORMAL HIGH (ref 4.0–10.5)
nRBC: 0.2 % (ref 0.0–0.2)

## 2019-12-04 LAB — GLUCOSE, CAPILLARY
Glucose-Capillary: 152 mg/dL — ABNORMAL HIGH (ref 70–99)
Glucose-Capillary: 168 mg/dL — ABNORMAL HIGH (ref 70–99)
Glucose-Capillary: 139 mg/dL — ABNORMAL HIGH (ref 70–99)

## 2019-12-04 LAB — PROTIME-INR
INR: 2.2 — ABNORMAL HIGH (ref 0.8–1.2)
Prothrombin Time: 23.3 seconds — ABNORMAL HIGH (ref 11.4–15.2)

## 2019-12-04 LAB — BASIC METABOLIC PANEL
Anion gap: 10 (ref 5–15)
BUN: 82 mg/dL — ABNORMAL HIGH (ref 8–23)
CO2: 25 mmol/L (ref 22–32)
Calcium: 7.8 mg/dL — ABNORMAL LOW (ref 8.9–10.3)
Chloride: 99 mmol/L (ref 98–111)
Creatinine, Ser: 3.33 mg/dL — ABNORMAL HIGH (ref 0.61–1.24)
GFR calc Af Amer: 21 mL/min — ABNORMAL LOW (ref >60)
GFR calc non Af Amer: 18 mL/min — ABNORMAL LOW (ref >60)
Glucose, Bld: 140 mg/dL — ABNORMAL HIGH (ref 70–99)
Potassium: 4.1 mmol/L (ref 3.5–5.1)
Sodium: 134 mmol/L — ABNORMAL LOW (ref 135–145)

## 2019-12-04 LAB — MAGNESIUM: Magnesium: 2.4 mg/dL (ref 1.7–2.4)

## 2019-12-04 LAB — PROCALCITONIN: Procalcitonin: 0.85 ng/mL

## 2019-12-04 LAB — HEMOGLOBIN AND HEMATOCRIT, BLOOD
HCT: 25.1 % — ABNORMAL LOW (ref 39.0–52.0)
Hemoglobin: 8 g/dL — ABNORMAL LOW (ref 13.0–17.0)

## 2019-12-04 LAB — PREPARE RBC (CROSSMATCH)

## 2019-12-04 LAB — PHOSPHORUS: Phosphorus: 3.5 mg/dL (ref 2.5–4.6)

## 2019-12-04 LAB — HAPTOGLOBIN: Haptoglobin: 229 mg/dL (ref 32–363)

## 2019-12-04 LAB — PATHOLOGIST SMEAR REVIEW

## 2019-12-04 MED ORDER — INSULIN ASPART 100 UNIT/ML ~~LOC~~ SOLN
0.0000 [IU] | Freq: Three times a day (TID) | SUBCUTANEOUS | Status: DC
  Administered 2019-12-04 (×2): 2 [IU] via SUBCUTANEOUS
  Administered 2019-12-05 – 2019-12-08 (×7): 1 [IU] via SUBCUTANEOUS
  Administered 2019-12-09: 3 [IU] via SUBCUTANEOUS
  Administered 2019-12-10: 1 [IU] via SUBCUTANEOUS
  Administered 2019-12-12 (×2): 2 [IU] via SUBCUTANEOUS
  Administered 2019-12-13 (×2): 1 [IU] via SUBCUTANEOUS

## 2019-12-04 MED ORDER — VANCOMYCIN HCL 1500 MG/300ML IV SOLN
1500.0000 mg | Freq: Once | INTRAVENOUS | Status: AC
  Administered 2019-12-04: 1500 mg via INTRAVENOUS
  Filled 2019-12-04: qty 300

## 2019-12-04 MED ORDER — PREDNISONE 20 MG PO TABS
40.0000 mg | ORAL_TABLET | Freq: Every day | ORAL | Status: AC
  Administered 2019-12-04 – 2019-12-08 (×5): 40 mg via ORAL
  Filled 2019-12-04 (×5): qty 2

## 2019-12-04 MED ORDER — SODIUM CHLORIDE 0.9 % IV SOLN
2.0000 g | INTRAVENOUS | Status: DC
  Administered 2019-12-04 – 2019-12-05 (×2): 2 g via INTRAVENOUS
  Filled 2019-12-04: qty 2
  Filled 2019-12-04: qty 0.1
  Filled 2019-12-04: qty 2

## 2019-12-04 MED ORDER — SODIUM CHLORIDE 0.9 % IV SOLN
510.0000 mg | Freq: Once | INTRAVENOUS | Status: DC
  Filled 2019-12-04: qty 17

## 2019-12-04 MED ORDER — IPRATROPIUM-ALBUTEROL 0.5-2.5 (3) MG/3ML IN SOLN
3.0000 mL | RESPIRATORY_TRACT | Status: DC | PRN
  Administered 2019-12-07: 3 mL via RESPIRATORY_TRACT
  Filled 2019-12-04: qty 3

## 2019-12-04 MED ORDER — SODIUM CHLORIDE 0.9% IV SOLUTION: Freq: Once | INTRAVENOUS | Status: AC

## 2019-12-04 MED ORDER — VANCOMYCIN VARIABLE DOSE PER UNSTABLE RENAL FUNCTION (PHARMACIST DOSING): Status: DC

## 2019-12-04 NOTE — Progress Notes (Signed)
NAME:  Justin Boone, MRN:  [id number], DOB:  11-May-1951, LOS: 87 ADMISSION DATE:  11/25/2019, CONSULTATION DATE:  12/02/2010 REFERRING MD:  Dr. Sallyanne Kuster , CHIEF COMPLAINT:  Hypotension and hypoxia  Brief History   Patient is a 69 year old male who transferred from outside facility for consultation of cardiology and nephrology.  Underwent TAVR due to severe aortic stenosis on 6/22.  1 days following TAVR patient seen with presyncopal episodes.  On 6/24 patient again seen with presyncopal episode and development of hypoxia with persistent hypotension necessitating transfer to ICU and consult to PCCM  History of present illness   Justin Boone is a 69 year old male with a past medical history significant for atrial fibrillation on anticoagulation with Coumadin, bradycardia status post pacemaker, systolic congestive heart failure, type 2 diabetes, hypertension, severe aortic stenosis now status post TAVR, chronic kidney disease stage III, gout, COPD, obstructive sleep apnea on, hyperlipidemia, CAD status post PCI, and chronic tobacco abuse who initially presented with worsening dyspnea to outside facility.  On 6/12 patient was transferred to [name] for cardiology and nephrology consultations.  Prior to transfer patient had CT chest done without contrast on 11/15/2019 that revealed right greater than left pleural effusion with compressive volume loss and underwent thoracentesis on 8/2 with 1.1 L removed.  On transfer to [name] he was medically optimized from cardiology and nephrology standpoint and underwent TAVR on 6/22.  Postop day 1 patient seen with episode of presyncope felt secondary to hypovolemia for which he received IV hydration.  The following day, postop day 2, patient had 2 additional episodes of presyncope with development of persistent hypotension and hypoxia precipitating cardiology consultation to critical care and transfer to ICU.  Concern for retroperitoneal bleed as well given drop  in hemoglobin.  Past Medical History  Chronic atrial fibrillation anticoagulated on Coumadin Bradycardia status post pacemaker placement Systolic congestive heart failure Type 2 diabetes Hypertension Severe aortic stenosis now status post TAVR Chronic kidney disease stage III Gout COPD  Daily tobacco abuse Obstructive sleep apnea with nocturnal CPAP, reports compliance Hyperlipidemia CAD status post PCI  Significant Hospital Events   Admitted 6/12 from outside facility  Consults:  Cardiology Nephrology PCCM Cardiothoracic surgery Dental medicine  Procedures:  Dental extraction of teeth 11, 13, and 14 6/16 TAVR 6/21  Significant Diagnostic Tests:  Chest x-ray 6/24 > moderate to severe right pleural effusion  Micro Data:  Covid 6/12 > negative Surgical PCR 6/16 > negative MRSA PCR screening 6/21 > negative  Antimicrobials:  None  Interim history/subjective:  Feels a little better today. Coughing up brown sputum. +wheezing Still very weak Starting to make urine with inotropes  Objective   Blood pressure (!) 105/94, pulse 81, temperature 98 F (36.7 C), temperature source Oral, resp. rate (!) 29, height 5\' 6"  (1.676 m), weight 82.9 kg, SpO2 92 %. CVP:  [15 mmHg] 15 mmHg      Intake/Output Summary (Last 24 hours) at 12/04/2019 0738 Last data filed at 12/04/2019 0700 Gross per 24 hour  Intake 1383.57 ml  Output 2775 ml  Net -1391.43 ml   Filed Weights   12/02/19 0516 12/03/19 0543 12/04/19 0500  Weight: 83.1 kg 83.6 kg 82.9 kg    Examination: GEN: no acute distress sitting in bed HEENT: MMM, neck veins up CV: RRR, +SEM, ext warm PULM:  More rhonci today, no accessory muscle use GI: Soft, hypoactive BS, has bruising along groin EXT: marked anasarca NEURO: has tremor of right arm, chronic, extremely weak PSYCH:  poor insight SKIN:no rashes    Resolved Hospital Problem list     Assessment & Plan:   Shock: cardiogenic +/- hemorrhagic, improved,  Global body volume overload, Acute on chronic kidney injury S/p TAVR, Afib, baseline biventricular failure- RV probably related more to COPD - Inotropes and diuretics per cardiology team  Dropping platelets, rising INR- last coumadin 6/23 - Trend for now, hemolysis panel equivocal, no schistocytes on cmear  Severe muscular deconditioning- PT and progressive mobility  Anemia, flank bruising- monitor clinically, no significant hematoma on CT  COPD, worsening CXR- given exam and history, will start HCAP coverage and a short course of steroids, check sputum and Pct   Best practice:  Diet: Cardiac Pain/Anxiety/Delirium protocol (if indicated): As needed VAP protocol (if indicated): Not applicable DVT prophylaxis: SCDs GI prophylaxis: PPI Glucose control: SSI Mobility: Bedrest Code Status: Full code Family Communication: Per primary Disposition: ICU  The patient is critically ill with multiple organ systems failure and requires high complexity decision making for assessment and support, frequent evaluation and titration of therapies, application of advanced monitoring technologies and extensive interpretation of multiple databases. Critical Care Time devoted to patient care services described in this note independent of APP/resident time (if applicable)  is 33 minutes.   Erskine Emery MD [facility] 12/04/2019 7:38 AM Personal pager: [phone] If unanswered, please page CCM On-call: [phone]

## 2019-12-04 NOTE — Progress Notes (Signed)
RT obtained ABG on pt with the following results. RT will continue to monitor.   Results for Justin Boone, Justin Boone (MRN [id number]) as of 12/04/2019 23:38  Ref. Range 12/04/2019 23:29  Sample type Unknown ARTERIAL  pH, Arterial Latest Ref Range: 7.35 - 7.45  7.459 (H)  pCO2 arterial Latest Ref Range: 32 - 48 mmHg 36.2  pO2, Arterial Latest Ref Range: 83 - 108 mmHg 44 (L)  TCO2 Latest Ref Range: 22 - 32 mmol/L 27  Acid-Base Excess Latest Ref Range: 0.0 - 2.0 mmol/L 2.0  Bicarbonate Latest Ref Range: 20.0 - 28.0 mmol/L 25.7  O2 Saturation Latest Units: % 82.0  Patient temperature Unknown 98.8 F  Collection site Unknown Radial

## 2019-12-04 NOTE — Progress Notes (Signed)
Patient ID: Justin Boone, male   DOB: 1950/07/08, 69 y.o.   MRN: [id number]     Advanced Heart Failure Rounding Note  PCP-Cardiologist: Sherren Mocha, MD   Subjective:    Good diuresis yesterday, weight down 2 lbs.  Still mildly tachypneic and wheezing.  CXR with bilateral airspace disease.  Afebrile, WBCs 14.  Creatinine stable at 3.3.  CVP 11-12 today with co-ox 54% when drawn early am.   Hgb 7.5 today, received PRBCs yesterday.    Objective:   Weight Range: 82.9 kg Body mass index is 29.5 kg/m.   Vital Signs:   Temp:  [97.4 F (36.3 C)-98.5 F (36.9 C)] 97.4 F (36.3 C) (06/26 0803) Pulse Rate:  [61-81] 71 (06/26 0800) Resp:  [11-29] 15 (06/26 0800) BP: (85-126)/(43-94) 118/65 (06/26 0800) SpO2:  [85 %-95 %] 93 % (06/26 0800) Weight:  [82.9 kg] 82.9 kg (06/26 0500) Last BM Date: 11/29/19  Weight change: Filed Weights   12/02/19 0516 12/03/19 0543 12/04/19 0500  Weight: 83.1 kg 83.6 kg 82.9 kg    Intake/Output:   Intake/Output Summary (Last 24 hours) at 12/04/2019 0851 Last data filed at 12/04/2019 0800 Gross per 24 hour  Intake 1039.84 ml  Output 2625 ml  Net -1585.16 ml      Physical Exam    General: NAD Neck: JVP 14 cm, no thyromegaly or thyroid nodule.  Lungs: Crackles and wheezes bilaterally.  CV: Nondisplaced PMI.  Heart regular S1/S2, no S3/S4, no murmur.  1+ edema to knees.   Abdomen: Soft, nontender, no hepatosplenomegaly, no distention.  Skin: Intact without lesions or rashes.  Neurologic: Alert and oriented x 3.  Psych: Normal affect. Extremities: No clubbing or cyanosis.  HEENT: Normal.    Telemetry   ?Atrial fibrillation with BiV pacing (personally reviewed)  Labs    CBC Recent Labs    12/03/19 0258 12/03/19 0258 12/03/19 0842 12/04/19 0328  WBC 12.8*  --   --  14.3*  HGB 8.0*  --   --  7.5*  HCT 24.9*  --   --  23.6*  MCV 92.6  --   --  91.8  PLT 90*   < > 87* 78*   < > = values in this interval not displayed.   Basic  Metabolic Panel Recent Labs    12/03/19 1400 12/04/19 0328  NA 135 134*  K 4.6 4.1  CL 100 99  CO2 23 25  GLUCOSE 167* 140*  BUN 84* 82*  CREATININE 3.31* 3.33*  CALCIUM 7.9* 7.8*  MG  --  2.4  PHOS  --  3.5   Liver Function Tests Recent Labs    12/03/19 0842  AST 93*  ALT 144*  ALKPHOS 122  BILITOT 1.8*  PROT 5.0*  ALBUMIN 2.2*   No results for input(s): LIPASE, AMYLASE in the last 72 hours. Cardiac Enzymes No results for input(s): CKTOTAL, CKMB, CKMBINDEX, TROPONINI in the last 72 hours.  BNP: BNP (last 3 results) Recent Labs    11/10/2019 2002 11/29/19 1513  BNP 3,282.1* 2,503.4*    ProBNP (last 3 results) No results for input(s): PROBNP in the last 8760 hours.   D-Dimer Recent Labs    12/03/19 0842  DDIMER 2.13*   Hemoglobin A1C No results for input(s): HGBA1C in the last 72 hours. Fasting Lipid Panel No results for input(s): CHOL, HDL, LDLCALC, TRIG, CHOLHDL, LDLDIRECT in the last 72 hours. Thyroid Function Tests No results for input(s): TSH, T4TOTAL, T3FREE, THYROIDAB in the last 72 hours.  Invalid input(s): FREET3  Other results:   Imaging     No results found.   Medications:     Scheduled Medications: . sodium chloride   Intravenous Once  . amiodarone  400 mg Oral Daily  . atorvastatin  80 mg Oral q1800  . Chlorhexidine Gluconate Cloth  6 each Topical Daily  . Chlorhexidine Gluconate Cloth  6 each Topical Q0600  . clopidogrel  75 mg Oral Daily  . DULoxetine  60 mg Oral Daily  . insulin aspart  0-9 Units Subcutaneous TID WC  . mouth rinse  15 mL Mouth Rinse BID  . mometasone-formoterol  2 puff Inhalation BID  . multivitamin with minerals  1 tablet Oral Daily  . nicotine  21 mg Transdermal Daily  . pantoprazole  40 mg Oral Daily  . predniSONE  40 mg Oral Q breakfast  . sodium chloride flush  10-40 mL Intracatheter Q12H  . sodium chloride flush  3 mL Intravenous Q12H  . tamsulosin  0.4 mg Oral QPC breakfast  . vancomycin  variable dose per unstable renal function (pharmacist dosing)   Does not apply See admin instructions     Infusions: . sodium chloride    . ceFEPime (MAXIPIME) IV    . ferumoxytol    . furosemide (LASIX) infusion 10 mg/hr (12/04/19 0800)  . milrinone 0.25 mcg/kg/min (12/04/19 0800)  . norepinephrine (LEVOPHED) Adult infusion Stopped (12/02/19 1450)  . sodium chloride irrigation    . vancomycin       PRN Medications:  sodium chloride, acetaminophen **OR** acetaminophen, melatonin, sodium chloride flush, sodium chloride flush   Assessment/Plan   1. Acute on chronic systolic CHF with prominent RV dysfunction: Has MDT CRT-P device.  Most recent echo with EF 40-45% with severely dilated RV with moderate systolic dysfunction.  RV function looks worse than pre-op but was not normal previously.  Hs-TnI not significantly elevated so doubt RV infarct/closure of RCA stent.  Suspect RV dysfunction is due to history of severe COPD.  Today, he remains volume overloaded on exam with CVP 11-12.  He diuresed well yesterday, weight down.  Co-ox low at 54% in early am.  - Continue milrinone 0.25 mcg/kg/min for RV support, repeat co-ox now and can increase milrinone if needed.  - Continue Lasix gtt 10 mg/hr.  2. AKI on CKD stage 3: Baseline creatinine 2, now up to 3.3 but stable.  Suspect multifactorial => cardiorenal, contrast, hypotension.  - Support RV with milrinone.  - Diuresis, follow creatinine closely.  3. CAD: s/p PCI to RCA in 2/21. As above, HS-TnI not significantly elevated, no evidence for RV infarct.  - Continue Plavix, statin.  4. Atrial fibrillation: A-paced yesterday, today may be in underlying afib with BiV pacing.  - Will get ECG.   - Continue amiodarone 400 mg daily.  - Currently holding anticoagulation with anemia/concern for bleeding.  5. Anemia: Hgb 7.5 today, getting blood (has had several units now).  No source of bleeding found yet.  No RP hematoma on CT.  - Transfuse hgb <  8, will get 1 unit PRBCs today.  - Fe deficient, will get feraheme.  6. Thrombocytopenia: Plts down to 78K. Suspect due to critical illness.  7. Severe AS: S/p TAVR. Bioprosthetic aortic valve stable on post-op echo.  8. COPD: Severe, recently quit smoking. Wheezing.  - Prednisone begun today by CCM.  9. ID: Concern for HCAP. Bilateral airspace disease on CXR. Afebrile currently with WBCs 14.  - Starting vancomycin/cefepime.  - Send  procalcitonin.   CRITICAL CARE Performed by: Loralie Champagne  Total critical care time: 35 minutes  Critical care time was exclusive of separately billable procedures and treating other patients.  Critical care was necessary to treat or prevent imminent or life-threatening deterioration.  Critical care was time spent personally by me on the following activities: development of treatment plan with patient and/or surrogate as well as nursing, discussions with consultants, evaluation of patient's response to treatment, examination of patient, obtaining history from patient or surrogate, ordering and performing treatments and interventions, ordering and review of laboratory studies, ordering and review of radiographic studies, pulse oximetry and re-evaluation of patient's condition.   Length of Stay: [place], MD  12/04/2019, 8:51 AM  Advanced Heart Failure Team Pager [phone] (M-F; 7a - 4p)  Please contact [facility] for night-coverage after hours (4p -7a ) and weekends on [email or web address]

## 2019-12-04 NOTE — Progress Notes (Signed)
Pharmacy Antibiotic Note  Justin Boone is a 69 y.o. male admitted on 11/21/2019 for cards/nephrology consults, now w/ concern for pneumonia.  Pharmacy has been consulted for vancomycin and cefepime dosing.  Pt's SCr has been elevated, leveled out over last few days but still >3, baseline ~2.  Plan: Vancomycin 1500mg  IV x1; monitor SCr prior to redosing. Cefepime 2g IV Q24H.  Height: 5\' 6"  (167.6 cm) Weight: 82.9 kg (182 lb 12.2 oz) IBW/kg (Calculated) : 63.8  Temp (24hrs), Avg:98 F (36.7 C), Min:97.6 F (36.4 C), Max:98.5 F (36.9 C)  Recent Labs  Lab 12/03/2019 0511 11/29/2019 1539 12/01/19 0233 12/02/19 0347 12/03/19 0258 12/03/19 1400 12/04/19 0328  WBC 11.1*  --  8.1 16.4* 12.8*  --  14.3*  CREATININE 2.10*   < > 2.43* 3.05* 3.34* 3.31* 3.33*   < > = values in this interval not displayed.    Estimated Creatinine Clearance: 21.7 mL/min (A) (by C-G formula based on SCr of 3.33 mg/dL (H)).    Allergies  Allergen Reactions  . Contrast Media [Iodinated Diagnostic Agents]   . Sulfa Antibiotics     Thank you for allowing pharmacy to be a part of this patient's care.  Wynona Neat, PharmD, BCPS  12/04/2019 7:56 AM

## 2019-12-04 NOTE — Plan of Care (Signed)

## 2019-12-04 NOTE — Progress Notes (Signed)
[facility] Progress Note Patient Name: Justin Boone DOB: 05-19-1951 MRN: [id number]   Date of Service  12/04/2019  HPI/Events of Note  Respiratory therapy reports increased WOB and increased O2 requirement. Hx of severe COPD and CHF. CXR looks wet. Currently on Lasix IV infusion. Now on HFNC and has BiPAP ordered at night.   eICU Interventions  Plam: 1. Place on BiPAP now.  2. ABG in 1 to 2 hours.  3. Will ask ground team to evaluate at bedside.      Intervention Category Major Interventions: Hypoxemia - evaluation and management  Justin Boone 12/04/2019, 8:35 PM

## 2019-12-04 NOTE — Progress Notes (Signed)
Pt placed on BIPAP for the night for increased WOB and increased O2 requirements. Pt tolerating well. Pt respiratory status is stable at this time. RT will continue to monitor.

## 2019-12-04 NOTE — Progress Notes (Signed)
Hemoglobin this AM 7.5 Paged Marletta Lor MD and no additional orders at this time.

## 2019-12-05 DIAGNOSIS — L8990 Pressure ulcer of unspecified site, unspecified stage: Secondary | ICD-10-CM | POA: Insufficient documentation

## 2019-12-05 DIAGNOSIS — I5023 Acute on chronic systolic (congestive) heart failure: Secondary | ICD-10-CM

## 2019-12-05 DIAGNOSIS — N183 Chronic kidney disease, stage 3 unspecified: Secondary | ICD-10-CM

## 2019-12-05 LAB — CBC
HCT: 24.9 % — ABNORMAL LOW (ref 39.0–52.0)
Hemoglobin: 8.2 g/dL — ABNORMAL LOW (ref 13.0–17.0)
MCH: 29.9 pg (ref 26.0–34.0)
MCHC: 32.9 g/dL (ref 30.0–36.0)
MCV: 90.9 fL (ref 80.0–100.0)
Platelets: 70 10*3/uL — ABNORMAL LOW (ref 150–400)
RBC: 2.74 MIL/uL — ABNORMAL LOW (ref 4.22–5.81)
RDW: 17.5 % — ABNORMAL HIGH (ref 11.5–15.5)
WBC: 14 10*3/uL — ABNORMAL HIGH (ref 4.0–10.5)
nRBC: 0.1 % (ref 0.0–0.2)

## 2019-12-05 LAB — BPAM RBC
Blood Product Expiration Date: 202107272359
ISSUE DATE / TIME: 202106260900
Unit Type and Rh: 5100

## 2019-12-05 LAB — COOXEMETRY PANEL
Carboxyhemoglobin: 1.6 % — ABNORMAL HIGH (ref 0.5–1.5)
Methemoglobin: 0.7 % (ref 0.0–1.5)
O2 Saturation: 67.6 %
Total hemoglobin: 7.7 g/dL — ABNORMAL LOW (ref 12.0–16.0)

## 2019-12-05 LAB — TYPE AND SCREEN
ABO/RH(D): O POS
Antibody Screen: NEGATIVE
Unit division: 0

## 2019-12-05 LAB — BASIC METABOLIC PANEL
Anion gap: 11 (ref 5–15)
BUN: 85 mg/dL — ABNORMAL HIGH (ref 8–23)
CO2: 24 mmol/L (ref 22–32)
Calcium: 7.8 mg/dL — ABNORMAL LOW (ref 8.9–10.3)
Chloride: 98 mmol/L (ref 98–111)
Creatinine, Ser: 3.37 mg/dL — ABNORMAL HIGH (ref 0.61–1.24)
GFR calc Af Amer: 21 mL/min — ABNORMAL LOW (ref >60)
GFR calc non Af Amer: 18 mL/min — ABNORMAL LOW (ref >60)
Glucose, Bld: 130 mg/dL — ABNORMAL HIGH (ref 70–99)
Potassium: 3.8 mmol/L (ref 3.5–5.1)
Sodium: 133 mmol/L — ABNORMAL LOW (ref 135–145)

## 2019-12-05 LAB — GLUCOSE, CAPILLARY
Glucose-Capillary: 118 mg/dL — ABNORMAL HIGH (ref 70–99)
Glucose-Capillary: 133 mg/dL — ABNORMAL HIGH (ref 70–99)
Glucose-Capillary: 127 mg/dL — ABNORMAL HIGH (ref 70–99)
Glucose-Capillary: 153 mg/dL — ABNORMAL HIGH (ref 70–99)

## 2019-12-05 LAB — PROTIME-INR
INR: 2.2 — ABNORMAL HIGH (ref 0.8–1.2)
Prothrombin Time: 23.7 seconds — ABNORMAL HIGH (ref 11.4–15.2)

## 2019-12-05 LAB — MAGNESIUM: Magnesium: 2.4 mg/dL (ref 1.7–2.4)

## 2019-12-05 LAB — PHOSPHORUS: Phosphorus: 3.7 mg/dL (ref 2.5–4.6)

## 2019-12-05 LAB — VANCOMYCIN, RANDOM
Vancomycin Rm: 28
Vancomycin Rm: 26

## 2019-12-05 MED ORDER — POTASSIUM CHLORIDE CRYS ER 20 MEQ PO TBCR
20.0000 meq | EXTENDED_RELEASE_TABLET | Freq: Once | ORAL | Status: AC
  Administered 2019-12-05: 20 meq via ORAL
  Filled 2019-12-05: qty 1

## 2019-12-05 MED ORDER — AMIODARONE HCL IN DEXTROSE 360-4.14 MG/200ML-% IV SOLN
30.0000 mg/h | INTRAVENOUS | Status: DC
  Administered 2019-12-05 – 2019-12-09 (×9): 30 mg/h via INTRAVENOUS
  Filled 2019-12-05 (×10): qty 200

## 2019-12-05 MED ORDER — AMIODARONE LOAD VIA INFUSION
150.0000 mg | Freq: Once | INTRAVENOUS | Status: AC
  Administered 2019-12-05: 150 mg via INTRAVENOUS
  Filled 2019-12-05: qty 83.34

## 2019-12-05 MED ORDER — IPRATROPIUM BROMIDE 0.02 % IN SOLN
0.5000 mg | Freq: Three times a day (TID) | RESPIRATORY_TRACT | Status: DC
  Administered 2019-12-05 – 2019-12-06 (×3): 0.5 mg via RESPIRATORY_TRACT
  Filled 2019-12-05 (×2): qty 2.5

## 2019-12-05 MED ORDER — AMIODARONE HCL IN DEXTROSE 360-4.14 MG/200ML-% IV SOLN
60.0000 mg/h | INTRAVENOUS | Status: DC
  Administered 2019-12-05: 60 mg/h via INTRAVENOUS
  Filled 2019-12-05 (×2): qty 200

## 2019-12-05 MED ORDER — LEVALBUTEROL HCL 0.63 MG/3ML IN NEBU
0.6300 mg | INHALATION_SOLUTION | Freq: Three times a day (TID) | RESPIRATORY_TRACT | Status: DC
  Administered 2019-12-05 – 2019-12-08 (×9): 0.63 mg via RESPIRATORY_TRACT
  Filled 2019-12-05 (×9): qty 3

## 2019-12-05 NOTE — Progress Notes (Signed)
Pharmacy Antibiotic Note  Justin Boone is a 69 y.o. male admitted on 12/03/2019 for cards/nephrology consults, now w/ concern for pneumonia.  Pharmacy has been consulted for vancomycin and cefepime dosing.  Pt's SCr has been elevated, leveled out over last few days but still >3, baseline ~2.  VR = 28 (17 hrs post dose)- repeat level = 26 (26 hrs post dose).   Plan: D/c vancomycin given only gram negative organisms in Cx. Cefepime 2g IV Q24H - continue for now, awaiting sensitivities of stenotrophomonas.  Height: 5\' 6"  (167.6 cm) Weight: 81.7 kg (180 lb 1.9 oz) IBW/kg (Calculated) : 63.8  Temp (24hrs), Avg:98.1 F (36.7 C), Min:97.4 F (36.3 C), Max:98.8 F (37.1 C)  Recent Labs  Lab 12/01/19 0233 12/01/19 0233 12/02/19 0347 12/03/19 0258 12/03/19 1400 12/04/19 0328 12/05/19 0325 12/05/19 1126  WBC 8.1  --  16.4* 12.8*  --  14.3* 14.0*  --   CREATININE 2.43*   < > 3.05* 3.34* 3.31* 3.33* 3.37*  --   VANCORANDOM  --   --   --   --   --   --  28 26   < > = values in this interval not displayed.    Estimated Creatinine Clearance: 21.4 mL/min (A) (by C-G formula based on SCr of 3.37 mg/dL (H)).    Allergies  Allergen Reactions  . Contrast Media [Iodinated Diagnostic Agents]   . Sulfa Antibiotics     Antimicrobials this admission:  Zinacef 6/22 > 6/24 Vancomycin 6/22 > 6/23; Resume 6/26 >  Cefepime 6/26 >   Dose adjustments this admission:   Microbiology results:  6/26 Pleural fluid - ngtd 6/26 Sputum - abundant stenotrophomonas, serratia Thank you for allowing pharmacy to be a part of this patient's care.  [place] Crane, Vena Austria, BCPS, BCCP Clinical Pharmacist  12/05/2019 1:28 PM   [facility] pharmacy phone numbers are listed on [email or web address]

## 2019-12-05 NOTE — Progress Notes (Signed)
NAME:  Justin [facility]one, MRN:  [id number], DOB:  04-02-1951, LOS: 37 ADMISSION DATE:  11/21/2019, CONSULTATION DATE:  12/02/2010 REFERRING MD:  Dr. Sallyanne Kuster , CHIEF COMPLAINT:  Hypotension and hypoxia  Brief History   Patient is a 69 year old male who transferred from outside facility for consultation of cardiology and nephrology.  Underwent TAVR due to severe aortic stenosis on 6/22.  1 days following TAVR patient seen with presyncopal episodes.  On 6/24 patient again seen with presyncopal episode and development of hypoxia with persistent hypotension necessitating transfer to ICU and consult to PCCM  History of present illness   Justin Boone is a 69 year old male with a past medical history significant for atrial fibrillation on anticoagulation with Coumadin, bradycardia status post pacemaker, systolic congestive heart failure, type 2 diabetes, hypertension, severe aortic stenosis now status post TAVR, chronic kidney disease stage III, gout, COPD, obstructive sleep apnea on, hyperlipidemia, CAD status post PCI, and chronic tobacco abuse who initially presented with worsening dyspnea to outside facility.  On 6/12 patient was transferred to [name] for cardiology and nephrology consultations.  Prior to transfer patient had CT chest done without contrast on 11/15/2019 that revealed right greater than left pleural effusion with compressive volume loss and underwent thoracentesis on 8/2 with 1.1 L removed.  On transfer to [name] he was medically optimized from cardiology and nephrology standpoint and underwent TAVR on 6/22.  Postop day 1 patient seen with episode of presyncope felt secondary to hypovolemia for which he received IV hydration.  The following day, postop day 2, patient had 2 additional episodes of presyncope with development of persistent hypotension and hypoxia precipitating cardiology consultation to critical care and transfer to ICU.  Concern for retroperitoneal bleed as well given drop  in hemoglobin.  Past Medical History  Chronic atrial fibrillation anticoagulated on Coumadin Bradycardia status post pacemaker placement Systolic congestive heart failure Type 2 diabetes Hypertension Severe aortic stenosis now status post TAVR Chronic kidney disease stage III Gout COPD  Daily tobacco abuse Obstructive sleep apnea with nocturnal CPAP, reports compliance Hyperlipidemia CAD status post PCI  Significant Hospital Events   Admitted 6/12 from outside facility  Consults:  Cardiology Nephrology PCCM Cardiothoracic surgery Dental medicine  Procedures:  Dental extraction of teeth 11, 13, and 14 6/16 TAVR 6/21  Significant Diagnostic Tests:  Chest x-ray 6/24 > moderate right pleural effusion  Micro Data:  Covid 6/12 > negative Surgical PCR 6/16 > negative MRSA PCR screening 6/21 > negative Sputum 6/26>>  Antimicrobials:  None  Interim history/subjective:  Had episode of desaturations requiring BIPAP last night Now improved Down to 6L O2 Congestion improving Net -1L  Objective   Blood pressure 136/88, pulse (!) 112, temperature 97.9 F (36.6 C), temperature source Oral, resp. rate (!) 26, height 5\' 6"  (1.676 m), weight 81.7 kg, SpO2 97 %.    FiO2 (%):  [35 %-60 %] 50 %   Intake/Output Summary (Last 24 hours) at 12/05/2019 0728 Last data filed at 12/05/2019 0600 Gross per 24 hour  Intake 1087.13 ml  Output 2150 ml  Net -1062.87 ml   Filed Weights   12/03/19 0543 12/04/19 0500 12/05/19 0500  Weight: 83.6 kg 82.9 kg 81.7 kg    Examination: GEN: no acute distress sitting in bed HEENT: MMM, neck veins up CV: RRR, +SEM, ext warm PULM:  More rhonci today, no accessory muscle use GI: Soft, hypoactive BS, has bruising along groin EXT: marked anasarca NEURO: has tremor of right arm, chronic, extremely weak PSYCH:  poor insight SKIN:no rashes  Sodium remains low Cr plateauing Hgb stable WBC stable Plts 78>>70 INR stable  Resolved Hospital  Problem list     Assessment & Plan:   Shock: cardiogenic +/- hemorrhagic- hemorrhagic component improved.  Global body volume overload, Acute on chronic kidney injury S/p TAVR, Afib, baseline biventricular failure- RV probably related more to COPD - Inotropes and diuretics per cardiology team - Hgb goal > 7  Dropping platelets, rising INR- last coumadin 6/23 - Trend for now, hemolysis panel equivocal, no schistocytes on cmear  Severe muscular deconditioning- PT and progressive mobility  Anemia, flank bruising- monitor clinically, no significant hematoma on CT  COPD, worsening CXR- on HCAP coverage, Pct mildly elevated - Narrow abx pending cultures - Encourage IS and flutter - OOB as much as tolerated during day - Wean O2 - BIPAP PRN - Nebs, steroids as ordered   Best practice:  Diet: Cardiac Pain/Anxiety/Delirium protocol (if indicated): As needed VAP protocol (if indicated): Not applicable DVT prophylaxis: SCDs GI prophylaxis: PPI Glucose control: SSI Mobility: Bedrest Code Status: Full code Family Communication: Per primary Disposition: ICU pending inotrope wean and consistent improvement in resp status  The patient is critically ill with multiple organ systems failure and requires high complexity decision making for assessment and support, frequent evaluation and titration of therapies, application of advanced monitoring technologies and extensive interpretation of multiple databases. Critical Care Time devoted to patient care services described in this note independent of APP/resident time (if applicable)  is 32 minutes.   Erskine Emery MD [facility] 12/05/2019 7:28 AM Personal pager: [phone] If unanswered, please page CCM On-call: [phone]

## 2019-12-05 NOTE — Progress Notes (Signed)
Patient ID: Justin Boone, male   DOB: Jun 27, 1950, 69 y.o.   MRN: [id number]     Advanced Heart Failure Rounding Note  PCP-Cardiologist: Sherren Mocha, MD   Subjective:    Underwent TAVR 11/26/2019  Feeling better. Was up in the chair most of the day.   On milrinone 0.25 for RV support. Co-ox 68%. Breathing better but still wheezing.   On lasix gtt at 10. Weight down 2 pounds. Skin is weeping  Objective:   Weight Range: 81.7 kg Body mass index is 29.07 kg/m.   Vital Signs:   Temp:  [97.4 F (36.3 C)-98.8 F (37.1 C)] 97.4 F (36.3 C) (06/27 1200) Pulse Rate:  [75-113] 109 (06/27 1430) Resp:  [13-30] 17 (06/27 1430) BP: (88-150)/(49-88) 126/88 (06/27 1430) SpO2:  [76 %-100 %] 89 % (06/27 1430) FiO2 (%):  [35 %-60 %] 50 % (06/27 0045) Weight:  [81.7 kg] 81.7 kg (06/27 0500) Last BM Date: 12/05/19  Weight change: Filed Weights   12/03/19 0543 12/04/19 0500 12/05/19 0500  Weight: 83.6 kg 82.9 kg 81.7 kg    Intake/Output:   Intake/Output Summary (Last 24 hours) at 12/05/2019 1526 Last data filed at 12/05/2019 1400 Gross per 24 hour  Intake 671.94 ml  Output 2200 ml  Net -1528.06 ml      Physical Exam    CVP 9-10 General:  Lying flat in bed  + wheezing HEENT: normal Neck: supple. CVP 9-10 Carotids 2+ bilat; no bruits. No lymphadenopathy or thryomegaly appreciated. Cor: PMI nondisplaced. Tach regular Lungs: + wheezing Abdomen: soft, nontender, nondistended. No hepatosplenomegaly. No bruits or masses. Good bowel sounds. Extremities: no cyanosis, clubbing, rash, 1+ edema L knee wound Neuro: alert & orientedx3, cranial nerves grossly intact. moves all 4 extremities w/o difficulty. Affect pleasant     Telemetry   Atrial fibrillation with BiV pacing 100-110  Personally reviewed  Labs    CBC Recent Labs    12/04/19 0328 12/04/19 1343 12/04/19 2329 12/05/19 0325  WBC 14.3*  --   --  14.0*  HGB 7.5*   < > 8.5* 8.2*  HCT 23.6*   < > 25.0* 24.9*  MCV 91.8   --   --  90.9  PLT 78*  --   --  70*   < > = values in this interval not displayed.   Basic Metabolic Panel Recent Labs    12/04/19 0328 12/04/19 0328 12/04/19 2329 12/05/19 0325  NA 134*   < > 135 133*  K 4.1   < > 3.9 3.8  CL 99  --   --  98  CO2 25  --   --  24  GLUCOSE 140*  --   --  130*  BUN 82*  --   --  85*  CREATININE 3.33*  --   --  3.37*  CALCIUM 7.8*  --   --  7.8*  MG 2.4  --   --  2.4  PHOS 3.5  --   --  3.7   < > = values in this interval not displayed.   Liver Function Tests Recent Labs    12/03/19 0842  AST 93*  ALT 144*  ALKPHOS 122  BILITOT 1.8*  PROT 5.0*  ALBUMIN 2.2*   No results for input(s): LIPASE, AMYLASE in the last 72 hours. Cardiac Enzymes No results for input(s): CKTOTAL, CKMB, CKMBINDEX, TROPONINI in the last 72 hours.  BNP: BNP (last 3 results) Recent Labs    11/27/2019 2002 11/29/19 1513  BNP 3,282.1* 2,503.4*    ProBNP (last 3 results) No results for input(s): PROBNP in the last 8760 hours.   D-Dimer Recent Labs    12/03/19 0842  DDIMER 2.13*   Hemoglobin A1C No results for input(s): HGBA1C in the last 72 hours. Fasting Lipid Panel No results for input(s): CHOL, HDL, LDLCALC, TRIG, CHOLHDL, LDLDIRECT in the last 72 hours. Thyroid Function Tests No results for input(s): TSH, T4TOTAL, T3FREE, THYROIDAB in the last 72 hours.  Invalid input(s): FREET3  Other results:   Imaging    No results found.   Medications:     Scheduled Medications: . sodium chloride   Intravenous Once  . amiodarone  400 mg Oral Daily  . atorvastatin  80 mg Oral q1800  . Chlorhexidine Gluconate Cloth  6 each Topical Daily  . Chlorhexidine Gluconate Cloth  6 each Topical Q0600  . clopidogrel  75 mg Oral Daily  . DULoxetine  60 mg Oral Daily  . insulin aspart  0-9 Units Subcutaneous TID WC  . mouth rinse  15 mL Mouth Rinse BID  . mometasone-formoterol  2 puff Inhalation BID  . multivitamin with minerals  1 tablet Oral Daily  .  nicotine  21 mg Transdermal Daily  . pantoprazole  40 mg Oral Daily  . predniSONE  40 mg Oral Q breakfast  . sodium chloride flush  10-40 mL Intracatheter Q12H  . sodium chloride flush  3 mL Intravenous Q12H  . tamsulosin  0.4 mg Oral QPC breakfast    Infusions: . sodium chloride    . ceFEPime (MAXIPIME) IV Stopped (12/05/19 4081)  . furosemide (LASIX) infusion 10 mg/hr (12/05/19 1400)  . milrinone 0.25 mcg/kg/min (12/05/19 1400)  . norepinephrine (LEVOPHED) Adult infusion Stopped (12/02/19 1450)  . sodium chloride irrigation      PRN Medications: sodium chloride, acetaminophen **OR** acetaminophen, ipratropium-albuterol, melatonin, sodium chloride flush, sodium chloride flush   Assessment/Plan   1. Acute on chronic systolic CHF with prominent RV dysfunction: Has MDT CRT-P device.  Most recent echo with EF 40-45% with severely dilated RV with moderate systolic dysfunction.  RV function looks worse than pre-op but was not normal previously.  Hs-TnI not significantly elevated so doubt RV infarct/closure of RCA stent.  Suspect RV dysfunction is due to history of severe COPD.   - On milrinone 0.25 mcg/kg/min for RV support co-ox 68% - Weight down 2 pounds - Still overloaded. Continue Lasix gtt 10 mg/hr.  2. AKI on CKD stage 3: Baseline creatinine 2, now up to 3.3 but stable.  Suspect multifactorial => cardiorenal, contrast, hypotension/ATN.  - Creatinine stable 3.33 -> 3.37 - Support RV with milrinone.  - Diuresis, follow creatinine closely.  3. CAD: s/p PCI to RCA in 2/21. As above, HS-TnI not significantly elevated, no evidence for RV infarct.  - Denies s/s of angina - Continue Plavix, statin.  4. Atrial fibrillation, paroxysmal: A-paced on admit, now in underlying afib with BiV pacing.  - Rate is elevated. - Switch po amio to IV - Currently holding anticoagulation with anemia/concern for bleeding.  5. Anemia, acute blood loss: Hgb 8.2 today after transfusion yesterday - No  source of bleeding found yet.  No RP hematoma on CT.  6. Thrombocytopenia: Plts down to 78K-> 70k. Suspect due to critical illness.  7. Severe AS: S/p TAVR. Bioprosthetic aortic valve stable on post-op echo.  8. COPD with acute on chronic respiratory failure: Severe, recently quit smoking. Wheezing.  - Prednisone begun 6/26 by CCM.  - Start  nbs 9. ID: Concern for HCAP. Bilateral airspace disease on CXR. Afebrile currently with WBCs 14. - PCT 0.85  - On vancomycin/cefepime.    CRITICAL CARE Performed by: Glori Bickers  Total critical care time: 40 minutes  Critical care time was exclusive of separately billable procedures and treating other patients.  Critical care was necessary to treat or prevent imminent or life-threatening deterioration.  Critical care was time spent personally by me on the following activities: development of treatment plan with patient and/or surrogate as well as nursing, discussions with consultants, evaluation of patient's response to treatment, examination of patient, obtaining history from patient or surrogate, ordering and performing treatments and interventions, ordering and review of laboratory studies, ordering and review of radiographic studies, pulse oximetry and re-evaluation of patient's condition.   Length of Stay: Miller, MD  12/05/2019, 3:26 PM  Advanced Heart Failure Team Pager [phone] (M-F; 7a - 4p)  Please contact [facility] for night-coverage after hours (4p -7a ) and weekends on [email or web address]

## 2019-12-06 DIAGNOSIS — R0602 Shortness of breath: Secondary | ICD-10-CM

## 2019-12-06 LAB — BASIC METABOLIC PANEL
Anion gap: 13 (ref 5–15)
BUN: 80 mg/dL — ABNORMAL HIGH (ref 8–23)
CO2: 26 mmol/L (ref 22–32)
Calcium: 8 mg/dL — ABNORMAL LOW (ref 8.9–10.3)
Chloride: 97 mmol/L — ABNORMAL LOW (ref 98–111)
Creatinine, Ser: 3.25 mg/dL — ABNORMAL HIGH (ref 0.61–1.24)
GFR calc Af Amer: 22 mL/min — ABNORMAL LOW (ref >60)
GFR calc non Af Amer: 19 mL/min — ABNORMAL LOW (ref >60)
Glucose, Bld: 115 mg/dL — ABNORMAL HIGH (ref 70–99)
Potassium: 3.7 mmol/L (ref 3.5–5.1)
Sodium: 136 mmol/L (ref 135–145)

## 2019-12-06 LAB — GLUCOSE, CAPILLARY
Glucose-Capillary: 112 mg/dL — ABNORMAL HIGH (ref 70–99)
Glucose-Capillary: 161 mg/dL — ABNORMAL HIGH (ref 70–99)
Glucose-Capillary: 138 mg/dL — ABNORMAL HIGH (ref 70–99)
Glucose-Capillary: 132 mg/dL — ABNORMAL HIGH (ref 70–99)

## 2019-12-06 LAB — PROTIME-INR
INR: 1.7 — ABNORMAL HIGH (ref 0.8–1.2)
Prothrombin Time: 19.2 seconds — ABNORMAL HIGH (ref 11.4–15.2)

## 2019-12-06 LAB — CBC
HCT: 25.3 % — ABNORMAL LOW (ref 39.0–52.0)
Hemoglobin: 8.2 g/dL — ABNORMAL LOW (ref 13.0–17.0)
MCH: 29.1 pg (ref 26.0–34.0)
MCHC: 32.4 g/dL (ref 30.0–36.0)
MCV: 89.7 fL (ref 80.0–100.0)
Platelets: 58 10*3/uL — ABNORMAL LOW (ref 150–400)
RBC: 2.82 MIL/uL — ABNORMAL LOW (ref 4.22–5.81)
RDW: 17.2 % — ABNORMAL HIGH (ref 11.5–15.5)
WBC: 15 10*3/uL — ABNORMAL HIGH (ref 4.0–10.5)
nRBC: 0.3 % — ABNORMAL HIGH (ref 0.0–0.2)

## 2019-12-06 LAB — BODY FLUID CULTURE: Culture: NO GROWTH

## 2019-12-06 LAB — COOXEMETRY PANEL
Carboxyhemoglobin: 1.5 % (ref 0.5–1.5)
Methemoglobin: 0.7 % (ref 0.0–1.5)
O2 Saturation: 69.6 %
Total hemoglobin: 12.5 g/dL (ref 12.0–16.0)

## 2019-12-06 LAB — PHOSPHORUS: Phosphorus: 3.8 mg/dL (ref 2.5–4.6)

## 2019-12-06 LAB — MAGNESIUM: Magnesium: 2.3 mg/dL (ref 1.7–2.4)

## 2019-12-06 MED ORDER — LEVOFLOXACIN IN D5W 750 MG/150ML IV SOLN
750.0000 mg | INTRAVENOUS | Status: DC
  Administered 2019-12-06 – 2019-12-10 (×3): 750 mg via INTRAVENOUS
  Filled 2019-12-06 (×3): qty 150

## 2019-12-06 MED ORDER — IPRATROPIUM BROMIDE 0.02 % IN SOLN
0.5000 mg | Freq: Three times a day (TID) | RESPIRATORY_TRACT | Status: DC
  Administered 2019-12-06 – 2019-12-08 (×6): 0.5 mg via RESPIRATORY_TRACT
  Filled 2019-12-06 (×6): qty 2.5

## 2019-12-06 NOTE — Progress Notes (Signed)
Occupational Therapy Treatment Patient Details Name: Justin Boone MRN: [id number] DOB: 06/25/1950 Today's Date: 12/06/2019    History of present illness Pt is a 69 year old male with history of A. fib on Coumadin, CAD, bradycardia s/p  Medtronic pacer, DM 2, HTN, severe AS, COPD, CKD stage IIIb is [facility] to [facility] from [place] for cardiology evaluation. s/p right-sided thoracentesis for symptomatic right-sided pleural effusion at OSH. 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   OT comments  Pt presents seated in recliner, agreeable to working with OT but reports fatigued from AM activities, reports feeling better overall today compared to recent. Assisted with return to bed and seated ADL during session. Pt overall performing functional transfers using RW with minA (limited to stand pivot only given use of +1 assist and recent history of orthostasis). Pt on 9L HFNC initially with activity with drop in O2 sats - lowest noted 74% post transfer - given cues for deep breathing and increase to 10L HFNC O2 sats returned to >/=90% within 1-2 min, maintaining >90% end of session. HR 110-122 bpm throughout. Pt with no reports of dizziness, BP start of session 123/76, post transfer 128/79. Despite fatigue pt continues to demonstrate good motivation to return to his PLOF. Feel POC remains appropriate at this time. Will continue to follow while acutely admitted.    Follow Up Recommendations  Home health OT    Equipment Recommendations  3 in 1 bedside commode          Precautions / Restrictions Precautions Precautions: Fall Precaution Comments: watch 02 and BP (orthostatic) Restrictions Weight Bearing Restrictions: No       Mobility Bed Mobility Overal bed mobility: Needs Assistance Bed Mobility: Sit to Supine       Sit to supine: Min guard   General bed mobility comments: for lines and safety  Transfers Overall  transfer level: Needs assistance Equipment used: Rolling walker (2 wheeled) Transfers: Sit to/from [facility] Sit to Stand: Min assist Stand pivot transfers: Min assist       General transfer comment: assist to rise and stabilize at RW    Balance Overall balance assessment: Needs assistance Sitting-balance support: No upper extremity supported;Feet supported Sitting balance-Leahy Scale: Fair     Standing balance support: During functional activity Standing balance-Leahy Scale: Poor Standing balance comment: relies on RW for support                           ADL either performed or assessed with clinical judgement   ADL Overall ADL's : Needs assistance/impaired     Grooming: Sitting;Set up;Supervision/safety;Wash/dry face;Bed level Grooming Details (indicate cue type and reason): after return to bed                              Functional mobility during ADLs: Minimal assistance;Rolling walker (stand pivot) General ADL Comments: pt fatigued from morning activities, assisted with back to bed during session                        Cognition Arousal/Alertness: Awake/alert Behavior During Therapy: Justin [facility] for tasks assessed/performed Overall Cognitive Status: Within Functional Limits for tasks assessed  Exercises Other Exercises Other Exercises: encouraged IS and flutter valve use   Shoulder Instructions       General Comments noted Pt's IV in LUE dislodged end of session - RN made aware and in to address end of session    Pertinent Vitals/ Pain       Pain Assessment: No/denies pain  Home Living                                          Prior Functioning/Environment              Frequency  Min 3X/week        Progress Toward Goals  OT Goals(current goals can now be found in the care plan section)  Progress towards OT goals: Progressing  toward goals  Acute Rehab OT Goals Patient Stated Goal: to go home OT Goal Formulation: With patient/family Time For Goal Achievement: 12/17/19 Potential to Achieve Goals: Good ADL Goals Pt Will Perform Grooming: with min guard assist;standing Pt Will Perform Upper Body Bathing: with min guard assist;sitting Pt Will Perform Lower Body Bathing: with min guard assist;with adaptive equipment;sit to/from stand Pt Will Transfer to Toilet: with min assist;ambulating;bedside commode Additional ADL Goal #1: pt will complete bed mobility supervision level as precursor to adls  Plan Discharge plan remains appropriate    Co-evaluation    PT/OT/SLP Co-Evaluation/Treatment: Yes            AM-PAC OT "6 Clicks" Daily Activity     Outcome Measure   Help from another person eating meals?: None Help from another person taking care of personal grooming?: A Little Help from another person toileting, which includes using toliet, bedpan, or urinal?: A Lot Help from another person bathing (including washing, rinsing, drying)?: A Lot Help from another person to put on and taking off regular upper body clothing?: A Lot Help from another person to put on and taking off regular lower body clothing?: A Lot 6 Click Score: 15    End of Session Equipment Utilized During Treatment: Gait belt;Rolling walker  OT Visit Diagnosis: Unsteadiness on feet (R26.81);Muscle weakness (generalized) (M62.81)   Activity Tolerance Patient limited by fatigue;Patient tolerated treatment well   Patient Left in bed;with call bell/phone within reach;with nursing/sitter in room   Nurse Communication Mobility status (decreased O2 sats with mobility)        Time: 0938-1829 OT Time Calculation (min): 30 min  Charges: OT Evaluation $OT Eval Low Complexity: 1 Low OT Treatments $Self Care/Home Management : 23-37 mins  Lou Cal, OT Acute Rehabilitation Services Pager [phone] Office  [phone]    Raymondo Band 12/06/2019, 12:24 PM

## 2019-12-06 NOTE — Progress Notes (Signed)
Physical Therapy Treatment Patient Details Name: Justin Boone MRN: [id number] DOB: 1950/12/30 Today's Date: 12/06/2019    History of Present Illness Pt is a 69 year old male with history of A. fib on Coumadin, CAD, bradycardia s/p  Medtronic pacer, DM 2, HTN, severe AS, COPD, CKD stage IIIb is admitted to [facility] as a hospital to hospital transfer from [place] for cardiology evaluation. s/p right-sided thoracentesis for symptomatic right-sided pleural effusion at OSH. 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    PT Comments    Pt initially apprehensive about the idea of any mobility as currently on 9L HFNC with SpO2 94% and pt reporting LLE pain. Pt able to stand and walk 15' in room with chair follow without presyncope and perform seated HEP. Pt reports feeling better and stronger after activity and thankful for session. Pt educated for progression as medically able and continued mobility with nursing as well.   Sitting 125/78 (92) Standing 114/52 (66) After gait 126/68 (84) HR 108-120 SpO2 94% on 9L at rest and required 15L for gait with SpO2 88-94%    Follow Up Recommendations  Supervision/Assistance - 24 hour;Home health PT     Equipment Recommendations  Rolling walker with 5" wheels    Recommendations for Other Services       Precautions / Restrictions Precautions Precautions: Fall Precaution Comments: watch 02 and BP (orthostatic) Restrictions Weight Bearing Restrictions: No    Mobility  Bed Mobility Overal bed mobility: Needs Assistance Bed Mobility: Sit to Supine       Sit to supine: Min guard   General bed mobility comments: in chair on arrival and end of session  Transfers Overall transfer level: Needs assistance Equipment used: Rolling walker (2 wheeled) Transfers: Sit to/from Stand Sit to Stand: Min guard Stand pivot transfers: Min assist       General transfer comment: guarding for safety and lines x 2  trials  Ambulation/Gait Ambulation/Gait assistance: Min guard;+2 safety/equipment Gait Distance (Feet): 15 Feet Assistive device: Rolling walker (2 wheeled) Gait Pattern/deviations: Step-through pattern;Decreased stride length   Gait velocity interpretation: 1.31 - 2.62 ft/sec, indicative of limited community ambulator General Gait Details: slow cautious gait with RW with close chair follow. pt able to walk 15' x 2 trials with seated rest requiring 15L during gait with SPo2 88-94%   Stairs             Wheelchair Mobility    Modified Rankin (Stroke Patients Only)       Balance Overall balance assessment: Needs assistance Sitting-balance support: No upper extremity supported;Feet supported Sitting balance-Leahy Scale: Good     Standing balance support: During functional activity;Bilateral upper extremity supported Standing balance-Leahy Scale: Poor Standing balance comment: RW for support and safety                            Cognition Arousal/Alertness: Awake/alert Behavior During Therapy: WFL for tasks assessed/performed Overall Cognitive Status: Within Functional Limits for tasks assessed                                        Exercises General Exercises - Lower Extremity Long Arc Quad: AROM;Both;Seated;15 reps Hip Flexion/Marching: AROM;Both;Seated;10 reps Other Exercises Other Exercises: encouraged IS and flutter valve use    General Comments General comments (skin integrity, edema, etc.): noted Pt's IV in LUE  dislodged end of session - RN made aware and in to address end of session      Pertinent Vitals/Pain Pain Assessment: No/denies pain Pain Score: 6  Pain Location: left leg Pain Descriptors / Indicators: Guarding;Grimacing;Discomfort;Sore Pain Intervention(s): Limited activity within patient's tolerance;Repositioned;Monitored during session    Home Living                      Prior Function            PT  Goals (current goals can now be found in the care plan section) Acute Rehab PT Goals Patient Stated Goal: to go home Progress towards PT goals: Progressing toward goals    Frequency    Min 3X/week      PT Plan Current plan remains appropriate    Co-evaluation              AM-PAC PT "6 Clicks" Mobility   Outcome Measure  Help needed turning from your back to your side while in a flat bed without using bedrails?: None Help needed moving from lying on your back to sitting on the side of a flat bed without using bedrails?: A Little Help needed moving to and from a bed to a chair (including a wheelchair)?: A Little Help needed standing up from a chair using your arms (e.g., wheelchair or bedside chair)?: A Little Help needed to walk in hospital room?: A Little Help needed climbing 3-5 steps with a railing? : A Lot 6 Click Score: 18    End of Session   Activity Tolerance: Patient tolerated treatment well Patient left: in chair;with call bell/phone within reach Nurse Communication: Mobility status PT Visit Diagnosis: Muscle weakness (generalized) (M62.81);Other abnormalities of gait and mobility (R26.89)     Time: 4431-5400 PT Time Calculation (min) (ACUTE ONLY): 21 min  Charges:  $Gait Training: 8-22 mins                     Bayard Males, PT Acute Rehabilitation Services Pager: [phone] Office: [place] 12/06/2019, 12:37 PM

## 2019-12-06 NOTE — Progress Notes (Addendum)
Patient ID: Justin Boone, male   DOB: [birth year], 69 y.o.   MRN: [id number]     Advanced Heart Failure Rounding Note  PCP-Cardiologist: Sherren Mocha, MD   Subjective:    Underwent TAVR 11/12/2019  On milrinone 0.25 for RV support. Co-ox 70%.   On lasix gtt at 10. -3.4L in UOP yesterday. Weight down additional 6 lb pounds. CVP 10.    OOB sitting up in chair. Getting breathing treatment currently, which he feels is helping. Less wheezing.    Objective:   Weight Range: 79 kg Body mass index is 28.11 kg/m.   Vital Signs:   Temp:  [97.4 F (36.3 C)-98.5 F (36.9 C)] 98.1 F (36.7 C) (06/28 0813) Pulse Rate:  [94-114] 114 (06/28 0700) Resp:  [14-23] 16 (06/28 0700) BP: (88-138)/(59-90) 132/90 (06/28 0700) SpO2:  [76 %-100 %] 90 % (06/28 0700) FiO2 (%):  [50 %] 50 % (06/28 0405) Weight:  [79 kg] 79 kg (06/28 0500) Last BM Date: 12/05/19  Weight change: Filed Weights   12/04/19 0500 12/05/19 0500 12/06/19 0500  Weight: 82.9 kg 81.7 kg 79 kg    Intake/Output:   Intake/Output Summary (Last 24 hours) at 12/06/2019 0815 Last data filed at 12/06/2019 0700 Gross per 24 hour  Intake 915.54 ml  Output 3315 ml  Net -2399.46 ml      Physical Exam   CVP 10  General:  WM sitting up in chair. No respiratory difficulty HEENT: normal  Neck: supple. JVD ~9 cm.  + Rt IJ CVC. Carotids 2+ bilat; no bruits. No lymphadenopathy or thyromegaly appreciated. Cor: PMI nondisplaced. Regular rate & rhythm. No rubs, gallops or murmurs. Lungs: decreased BS rt base, faint crackles Lt base Abdomen: soft, nontender, nondistended. No hepatosplenomegaly. No bruits or masses. Good bowel sounds. Extremities: no cyanosis, clubbing, rash, 1+ bilateral LE edema, Lt knee wound bandaged Neuro: alert & oriented x 3, cranial nerves grossly intact. moves all 4 extremities w/o difficulty. Affect pleasant.   Telemetry   Atrial fibrillation with BiV pacing 100-110  Personally reviewed  Labs     CBC Recent Labs    12/04/19 0328 12/04/19 1343 12/04/19 2329 12/05/19 0325  WBC 14.3*  --   --  14.0*  HGB 7.5*   < > 8.5* 8.2*  HCT 23.6*   < > 25.0* 24.9*  MCV 91.8  --   --  90.9  PLT 78*  --   --  70*   < > = values in this interval not displayed.   Basic Metabolic Panel Recent Labs    12/05/19 0325 12/06/19 0315  NA 133* 136  K 3.8 3.7  CL 98 97*  CO2 24 26  GLUCOSE 130* 115*  BUN 85* 80*  CREATININE 3.37* 3.25*  CALCIUM 7.8* 8.0*  MG 2.4 2.3  PHOS 3.7 3.8   Liver Function Tests Recent Labs    12/03/19 0842  AST 93*  ALT 144*  ALKPHOS 122  BILITOT 1.8*  PROT 5.0*  ALBUMIN 2.2*   No results for input(s): LIPASE, AMYLASE in the last 72 hours. Cardiac Enzymes No results for input(s): CKTOTAL, CKMB, CKMBINDEX, TROPONINI in the last 72 hours.  BNP: BNP (last 3 results) Recent Labs    11/19/2019 2002 11/29/19 1513  BNP 3,282.1* 2,503.4*    ProBNP (last 3 results) No results for input(s): PROBNP in the last 8760 hours.   D-Dimer Recent Labs    12/03/19 0842  DDIMER 2.13*   Hemoglobin A1C No results for input(s): HGBA1C  in the last 72 hours. Fasting Lipid Panel No results for input(s): CHOL, HDL, LDLCALC, TRIG, CHOLHDL, LDLDIRECT in the last 72 hours. Thyroid Function Tests No results for input(s): TSH, T4TOTAL, T3FREE, THYROIDAB in the last 72 hours.  Invalid input(s): FREET3  Other results:   Imaging    No results found.   Medications:     Scheduled Medications: . sodium chloride   Intravenous Once  . atorvastatin  80 mg Oral q1800  . Chlorhexidine Gluconate Cloth  6 each Topical Daily  . Chlorhexidine Gluconate Cloth  6 each Topical Q0600  . clopidogrel  75 mg Oral Daily  . DULoxetine  60 mg Oral Daily  . insulin aspart  0-9 Units Subcutaneous TID WC  . ipratropium  0.5 mg Nebulization Q8H  . levalbuterol  0.63 mg Nebulization TID  . mouth rinse  15 mL Mouth Rinse BID  . mometasone-formoterol  2 puff Inhalation BID  .  multivitamin with minerals  1 tablet Oral Daily  . nicotine  21 mg Transdermal Daily  . pantoprazole  40 mg Oral Daily  . predniSONE  40 mg Oral Q breakfast  . sodium chloride flush  10-40 mL Intracatheter Q12H  . sodium chloride flush  3 mL Intravenous Q12H  . tamsulosin  0.4 mg Oral QPC breakfast    Infusions: . sodium chloride    . amiodarone 30 mg/hr (12/06/19 0700)  . ceFEPime (MAXIPIME) IV Stopped (12/05/19 2703)  . furosemide (LASIX) infusion 10 mg/hr (12/06/19 0700)  . milrinone 0.25 mcg/kg/min (12/06/19 0700)  . norepinephrine (LEVOPHED) Adult infusion Stopped (12/02/19 1450)  . sodium chloride irrigation      PRN Medications: sodium chloride, acetaminophen **OR** acetaminophen, ipratropium-albuterol, melatonin, sodium chloride flush, sodium chloride flush   Assessment/Plan   1. Acute on chronic systolic CHF with prominent RV dysfunction: Has MDT CRT-P device.  Most recent echo with EF 40-45% with severely dilated RV with moderate systolic dysfunction.  RV function looks worse than pre-op but was not normal previously.  Hs-TnI not significantly elevated so doubt RV infarct/closure of RCA stent.  Suspect RV dysfunction is due to history of severe COPD.   - On milrinone 0.25 mcg/kg/min for RV support co-ox 70%. Will try to wean today, reduce to 0.125.  - Still overloaded but responding well to lasix CVP 10. Continue Lasix gtt 10 mg/hr.  2. AKI on CKD stage 3: Baseline creatinine 2, now up to 3.3 but stable.  Suspect multifactorial => cardiorenal, contrast, hypotension/ATN.   - Creatinine stable 3.33 -> 3.37->3.25 - Support RV with milrinone.  - Diuresis, follow creatinine closely.  3. CAD: s/p PCI to RCA in 2/21. As above, HS-TnI not significantly elevated, no evidence for RV infarct.  - Denies s/s of angina - Continue Plavix, statin.  4. Atrial fibrillation, paroxysmal: A-paced on admit, now in underlying afib with BiV pacing.  - Rate is elevated, 110s  - Continue IV amio  while on milrinone  - Currently holding anticoagulation with anemia/concern for bleeding.  5. Anemia, acute blood loss: required transfusion. Hgb 8.2 yesterday. Repeat CBC pending - No source of bleeding found yet.  No RP hematoma on CT.  6. Thrombocytopenia: Plts down to 78K-> 70k yesterday. Suspect due to critical illness. Repeat CBC pending  7. Severe AS: S/p TAVR. Bioprosthetic aortic valve stable on post-op echo.  8. COPD with acute on chronic respiratory failure: Severe, recently quit smoking.   - Prednisone begun 6/26 by CCM.  - continue breathing treatments  9. ID:  Concern for HCAP. Bilateral airspace disease on CXR. Afebrile currently with WBCs 14. - PCT 0.85  - off vancomycin. Remains on cefepime.    Length of Stay: [address], PA-C  12/06/2019, 8:15 AM  Advanced Heart Failure Team Pager [phone] (M-F; [place])  Please contact [facility] for night-coverage after hours (4p -7a ) and weekends on [email or web address]  Patient seen and examined with the above-signed Advanced Practice Provider and/or Housestaff. I personally reviewed laboratory data, imaging studies and relevant notes. I independently examined the patient and formulated the important aspects of the plan. I have edited the note to reflect any of my changes or salient points. I have personally discussed the plan with the patient and/or family.  Continues to diurese well on milrinone 0.25. Respiratory status improving with diuresis, nebs and steroids. Sputum culture now + for stenotrophamonas and serratia. CCM adjusting abx. CBC pending to recheck hgb. No obvious bleeding but does have flank bruising. CT negative for RP bleed. Creatinine slightly improved.   General:  Sitting up in chair No resp difficulty HEENT: normal Neck: supple. JVP to jaw Carotids 2+ bilat; no bruits. No lymphadenopathy or thryomegaly appreciated. Cor: PMI nondisplaced. Regular tachy. No rubs, gallops or murmurs. Lungs: decreased BS  throughout Abdomen: soft, nontender, nondistended. No hepatosplenomegaly. No bruits or masses. Good bowel sounds. Extremities: no cyanosis, clubbing, rash, 3+ edema + left knee wrapped. + flank bruising and R groin hematoma stable Neuro: alert & orientedx3, cranial nerves grossly intact. moves all 4 extremities w/o difficulty. Affect pleasant  Continue diuresis with milrinone support. Watch renal function closely. Abx being adjusted by CCM. Continue steroids and nebs. Mobilize. Follow CBC.   Glori Bickers, MD  9:44 AM

## 2019-12-06 NOTE — Progress Notes (Signed)
NAME:  Justin Boone, MRN:  [id number], DOB:  1950/06/14, LOS: 51 ADMISSION DATE:  11/09/2019, CONSULTATION DATE:  12/02/2010 REFERRING MD:  Dr. Sallyanne Kuster , CHIEF COMPLAINT:  Hypotension and hypoxia  Brief History   Patient is a 69 year old male who transferred from outside facility for consultation of cardiology and nephrology.  Underwent TAVR due to severe aortic stenosis on 6/22.  1 days following TAVR patient seen with presyncopal episodes.  On 6/24 patient again seen with presyncopal episode and development of hypoxia with persistent hypotension necessitating transfer to ICU and consult to PCCM  History of present illness   Justin Boone is a 69 year old male with a past medical history significant for atrial fibrillation on anticoagulation with Coumadin, bradycardia status post pacemaker, systolic congestive heart failure, type 2 diabetes, hypertension, severe aortic stenosis now status post TAVR, chronic kidney disease stage III, gout, COPD, obstructive sleep apnea on, hyperlipidemia, CAD status post PCI, and chronic tobacco abuse who initially presented with worsening dyspnea to outside facility.  On 6/12 patient was transferred to [name] for cardiology and nephrology consultations.  Prior to transfer patient had CT chest done without contrast on 11/15/2019 that revealed right greater than left pleural effusion with compressive volume loss and underwent thoracentesis on 8/2 with 1.1 L removed.  On transfer to [name] he was medically optimized from cardiology and nephrology standpoint and underwent TAVR on 6/22.  Postop day 1 patient seen with episode of presyncope felt secondary to hypovolemia for which he received IV hydration.  The following day, postop day 2, patient had 2 additional episodes of presyncope with development of persistent hypotension and hypoxia precipitating cardiology consultation to critical care and transfer to ICU.  Concern for retroperitoneal bleed as well given drop  in hemoglobin.  Past Medical History  Chronic atrial fibrillation anticoagulated on Coumadin Bradycardia status post pacemaker placement Systolic congestive heart failure Type 2 diabetes Hypertension Severe aortic stenosis now status post TAVR Chronic kidney disease stage III Gout COPD  Daily tobacco abuse Obstructive sleep apnea with nocturnal CPAP, reports compliance Hyperlipidemia CAD status post PCI  Significant Hospital Events   Admitted 6/12 from outside facility  Consults:  Cardiology Nephrology PCCM Cardiothoracic surgery Dental medicine  Procedures:  Dental extraction of teeth 11, 13, and 14 6/16 TAVR 6/21  Significant Diagnostic Tests:  6/23 echo: LVEF 40-45% RV mildly reduced, RVSP 44 6/24 ct abd/pelvis: 1. No acute intra-abdominal or pelvic pathology. No intraperitoneal or retroperitoneal fluid collection or hematoma. 2. Partially visualized bilateral pleural effusions with findings concerning for multifocal pneumonia. Clinical correlation and follow-up to resolution recommended. 3. Diffuse subcutaneous edema and anasarca. 4. Aortic Atherosclerosis (ICD10-I70.0). Micro Data:  Covid 6/12 > negative Surgical PCR 6/16 > negative MRSA PCR screening 6/21 > negative Sputum 6/26>> steno, serratia   Antimicrobials:  Cefuroxime 6/22->6/24 cefepime 6/26-> vanc 6/26  Interim history/subjective:  6/28: on 11L [place] with sats 100%, turned down to 9L. Utilizing flutter and IS well, endorses feels better overall. Does utilize cpap at home but not oxygen previously. Up oob this am. Decreased appetite. Remains on milrinone, lasix and amio gtt's 6/27:Had episode of desaturations requiring BIPAP last night Now improved Down to 6L O2  Congestion improving Net -1L  Objective   Blood pressure 132/90, pulse (!) 114, temperature 98 F (36.7 C), temperature source Axillary, resp. rate 16, height 5\' 6"  (1.676 m), weight 79 kg, SpO2 90 %. CVP:  [8 mmHg-9 mmHg] 9 mmHg    FiO2 (%):  [50 %] 50 %  Intake/Output Summary (Last 24 hours) at 12/06/2019 0746 Last data filed at 12/06/2019 0700 Gross per 24 hour  Intake 1095.23 ml  Output 3415 ml  Net -2319.77 ml   Filed Weights   12/04/19 0500 12/05/19 0500 12/06/19 0500  Weight: 82.9 kg 81.7 kg 79 kg    Examination: GEN: no acute distress sitting up oob in chair HEENT: MMM, neck veins up  CV: RRR, +SEM, ext warm PULM:  More rhonci today, no accessory muscle use GI: Soft, hypoactive BS, has bruising along groin EXT: marked anasarca NEURO: has tremor of right arm, chronic, extremely weak PSYCH:  poor insight SKIN:no rashes  Lab Results  Component Value Date   WBC 14.0 (H) 12/05/2019   HGB 8.2 (L) 12/05/2019   HCT 24.9 (L) 12/05/2019   MCV 90.9 12/05/2019   PLT 70 (L) 12/05/2019   Lab Results  Component Value Date   CREATININE 3.25 (H) 12/06/2019   BUN 80 (H) 12/06/2019   NA 136 12/06/2019   K 3.7 12/06/2019   CL 97 (L) 12/06/2019   CO2 26 12/06/2019   Lab Results  Component Value Date   INR 1.7 (H) 12/06/2019   INR 2.2 (H) 12/05/2019   INR 2.2 (H) 12/04/2019    Resolved Hospital Problem list     Assessment & Plan:   Shock: cardiogenic +/- hemorrhagic- hemorrhagic component improved.  Global body volume overload S/p TAVR Afib: amio gtt baseline biventricular failure- RV probably related more to COPD - Inotropes and diuretics per cardiology/hf team -coox ~70 today - Hgb goal > 7  Thrombocytopenia -recheck cbc in am Coagulopathy with elevated  INR- last coumadin 6/23 - Trend for now, hemolysis panel equivocal, no schistocytes on smear -inr 1.7  Severe muscular deconditioning- PT and progressive mobility  Anemia, flank bruising- monitor clinically, no significant hematoma on CT  COPD, worsening CXR- on HCAP coverage, Pct mildly elevated - cefepime continues - Encourage IS and flutter - OOB as much as tolerated during day - Wean O2 - BIPAP PRN - Nebs, steroids as  ordered Serratia and steno pneumonia:  -will need to add levaquin vs ceftaz -cefepime does not cover steno -did have prolonged qt on 6/23 will have RN check this am.    Acute on chronic kidney injury: -Cr relatively stable to slightly improved 80/3.2  Best practice:  Diet: Cardiac Pain/Anxiety/Delirium protocol (if indicated): As needed VAP protocol (if indicated): Not applicable DVT prophylaxis: SCDs GI prophylaxis: PPI Glucose control: SSI Mobility: Bedrest Code Status: Full code Family Communication: Per primary Disposition: ICU pending inotrope wean and consistent improvement in resp status  Critical care time: The patient is critically ill with multiple organ systems failure and requires high complexity decision making for assessment and support, frequent evaluation and titration of therapies, application of advanced monitoring technologies and extensive interpretation of multiple databases.  Critical care time 32 mins. This represents my time independent of the NPs time taking care of the pt. This is excluding procedures.    [facility] 12/06/2019, 7:46 AM

## 2019-12-06 NOTE — Progress Notes (Signed)
   12/06/19 1100  Clinical Encounter Type  Visited With Patient and family together  Visit Type Initial  Referral From Family  Consult/Referral To Chaplain  Spiritual Encounters  Spiritual Needs Other (Comment) (Advanced Directive)   Chaplain engaged in initial visit with Ramez and his son.  They both would like to complete an Scientist, physiological for [employer].  Ralston's son is very aware of what his father desires concerning his health and quality of life.  Chaplain affirmed them for having such an important conversation and understanding the need for an Advanced Directive.  Chaplain provided education around AD and will follow-up with Asher and his son.

## 2019-12-07 DIAGNOSIS — J9601 Acute respiratory failure with hypoxia: Secondary | ICD-10-CM

## 2019-12-07 LAB — CBC WITH DIFFERENTIAL/PLATELET
Abs Immature Granulocytes: 0.09 10*3/uL — ABNORMAL HIGH (ref 0.00–0.07)
Basophils Absolute: 0 10*3/uL (ref 0.0–0.1)
Basophils Relative: 0 %
Eosinophils Absolute: 0 10*3/uL (ref 0.0–0.5)
Eosinophils Relative: 0 %
HCT: 24.8 % — ABNORMAL LOW (ref 39.0–52.0)
Hemoglobin: 8.1 g/dL — ABNORMAL LOW (ref 13.0–17.0)
Immature Granulocytes: 1 %
Lymphocytes Relative: 2 %
Lymphs Abs: 0.2 10*3/uL — ABNORMAL LOW (ref 0.7–4.0)
MCH: 29.6 pg (ref 26.0–34.0)
MCHC: 32.7 g/dL (ref 30.0–36.0)
MCV: 90.5 fL (ref 80.0–100.0)
Monocytes Absolute: 0.3 10*3/uL (ref 0.1–1.0)
Monocytes Relative: 3 %
Neutro Abs: 11.8 10*3/uL — ABNORMAL HIGH (ref 1.7–7.7)
Neutrophils Relative %: 94 %
Platelets: 48 10*3/uL — ABNORMAL LOW (ref 150–400)
RBC: 2.74 MIL/uL — ABNORMAL LOW (ref 4.22–5.81)
RDW: 17.2 % — ABNORMAL HIGH (ref 11.5–15.5)
WBC: 12.5 10*3/uL — ABNORMAL HIGH (ref 4.0–10.5)
nRBC: 0.2 % (ref 0.0–0.2)

## 2019-12-07 LAB — BASIC METABOLIC PANEL
Anion gap: 15 (ref 5–15)
BUN: 84 mg/dL — ABNORMAL HIGH (ref 8–23)
CO2: 26 mmol/L (ref 22–32)
Calcium: 8.2 mg/dL — ABNORMAL LOW (ref 8.9–10.3)
Chloride: 94 mmol/L — ABNORMAL LOW (ref 98–111)
Creatinine, Ser: 3.67 mg/dL — ABNORMAL HIGH (ref 0.61–1.24)
GFR calc Af Amer: 19 mL/min — ABNORMAL LOW (ref >60)
GFR calc non Af Amer: 16 mL/min — ABNORMAL LOW (ref >60)
Glucose, Bld: 107 mg/dL — ABNORMAL HIGH (ref 70–99)
Potassium: 3.6 mmol/L (ref 3.5–5.1)
Sodium: 135 mmol/L (ref 135–145)

## 2019-12-07 LAB — GLUCOSE, CAPILLARY
Glucose-Capillary: 110 mg/dL — ABNORMAL HIGH (ref 70–99)
Glucose-Capillary: 147 mg/dL — ABNORMAL HIGH (ref 70–99)
Glucose-Capillary: 124 mg/dL — ABNORMAL HIGH (ref 70–99)
Glucose-Capillary: 141 mg/dL — ABNORMAL HIGH (ref 70–99)

## 2019-12-07 LAB — PHOSPHORUS: Phosphorus: 4.3 mg/dL (ref 2.5–4.6)

## 2019-12-07 LAB — COOXEMETRY PANEL
Carboxyhemoglobin: 1.8 % — ABNORMAL HIGH (ref 0.5–1.5)
Methemoglobin: 0.7 % (ref 0.0–1.5)
O2 Saturation: 68.7 %
Total hemoglobin: 8.4 g/dL — ABNORMAL LOW (ref 12.0–16.0)

## 2019-12-07 LAB — PROTIME-INR
INR: 1.6 — ABNORMAL HIGH (ref 0.8–1.2)
Prothrombin Time: 18.3 seconds — ABNORMAL HIGH (ref 11.4–15.2)

## 2019-12-07 LAB — MAGNESIUM: Magnesium: 2.2 mg/dL (ref 1.7–2.4)

## 2019-12-07 MED ORDER — ENSURE ENLIVE PO LIQD
237.0000 mL | Freq: Two times a day (BID) | ORAL | Status: DC
  Administered 2019-12-07 – 2019-12-13 (×12): 237 mL via ORAL

## 2019-12-07 MED ORDER — POTASSIUM CHLORIDE CRYS ER 20 MEQ PO TBCR
40.0000 meq | EXTENDED_RELEASE_TABLET | Freq: Once | ORAL | Status: AC
  Administered 2019-12-07: 40 meq via ORAL
  Filled 2019-12-07: qty 2

## 2019-12-07 MED ORDER — GUAIFENESIN ER 600 MG PO TB12
600.0000 mg | ORAL_TABLET | Freq: Two times a day (BID) | ORAL | Status: DC
  Administered 2019-12-07 – 2019-12-13 (×12): 600 mg via ORAL
  Filled 2019-12-07 (×13): qty 1

## 2019-12-07 MED ORDER — FUROSEMIDE 10 MG/ML IJ SOLN
80.0000 mg | Freq: Every day | INTRAMUSCULAR | Status: DC
  Administered 2019-12-07: 80 mg via INTRAVENOUS
  Filled 2019-12-07: qty 8

## 2019-12-07 NOTE — Progress Notes (Signed)
Nutrition Follow-up  DOCUMENTATION CODES:   Not applicable  INTERVENTION:   Ensure Enlive po BID, each supplement provides 350 kcal and 20 grams of protein  Magic cup TID with meals, each supplement provides 290 kcal and 9 grams of protein  MVI with Minerals  NUTRITION DIAGNOSIS:   Increased nutrient needs related to post-op healing as evidenced by estimated needs.  Being addressed via TF   GOAL:   Patient will meet greater than or equal to 90% of their needs  Progressing   MONITOR:   PO intake, Supplement acceptance, Diet advancement, Labs, Weight trends, Skin, I & O's  REASON FOR ASSESSMENT:   Consult Assessment of nutrition requirement/status  ASSESSMENT:   Justin Boone is a 69 y.o. male with medical history significant of atrial fibrillation on coumadin, bradycardia s/p medtronic CRT-P, CHF, type 2 diabetes, hypertension, severe AS, chronic kidney disease stage III, gout, COPD, hyperlipidemia, CAD s/p PCI, tobacco abuse initially presented on 11/15/2019 with worsening dyspnea.  6/16 Dental extraction 6/21 TAVR  Pt currently requiring 10 L HFNC. Pt on milrinone, amiodarone and lasix drips  Pt continues with poor appetite, did not eat breakfast this AM. Recorded po intake 5-75%. <50% since procedure.   Current weight 77.7 kg; admit weight 82.8 kg. Net negative 4 L  Labs: Creatinine 3.67, BUN 85 Meds: MVI with Minerals, prednisone, KCl, ss novolog   Diet Order:   Diet Order            Diet Heart Room service appropriate? Yes; Fluid consistency: Thin  Diet effective now                 EDUCATION NEEDS:   Education needs have been addressed  Skin:  Skin Assessment: Skin Integrity Issues: Skin Integrity Issues:: Other (Comment), Incisions Incisions: lip Other: lt knee laceration  Last BM:  6/29  Height:   Ht Readings from Last 1 Encounters:  11/15/2019 5\' 6"  (1.676 m)    Weight:   Wt Readings from Last 1 Encounters:  12/07/19 77.7 kg     Ideal Body Weight:  64.5 kg  BMI:  Body mass index is 27.65 kg/m.  Estimated Nutritional Needs:   Kcal:  2050-2250  Protein:  105-120 grams  Fluid:  > 2 L  Kerman Passey MS, RDN, LDN, CNSC Registered Dietitian III RD Pager Number and RD On-Call Pager Number Located in [place]

## 2019-12-07 NOTE — Progress Notes (Signed)
NAME:  Justin Boone, MRN:  [id number], DOB:  13-Jan-1951, LOS: 77 ADMISSION DATE:  11/26/2019, CONSULTATION DATE:  12/02/2010 REFERRING MD:  Dr. Sallyanne Kuster , CHIEF COMPLAINT:  Hypotension and hypoxia  Brief History   Patient is a 68 year old male who transferred from outside facility for consultation of cardiology and nephrology.  Underwent TAVR due to severe aortic stenosis on 6/22.  1 days following TAVR patient seen with presyncopal episodes.  On 6/24 patient again seen with presyncopal episode and development of hypoxia with persistent hypotension necessitating transfer to ICU and consult to PCCM  History of present illness   Justin Boone is a 69 year old male with a past medical history significant for atrial fibrillation on anticoagulation with Coumadin, bradycardia status post pacemaker, systolic congestive heart failure, type 2 diabetes, hypertension, severe aortic stenosis now status post TAVR, chronic kidney disease stage III, gout, COPD, obstructive sleep apnea on, hyperlipidemia, CAD status post PCI, and chronic tobacco abuse who initially presented with worsening dyspnea to outside facility.  On 6/12 patient was transferred to [name] for cardiology and nephrology consultations.  Prior to transfer patient had CT chest done without contrast on 11/15/2019 that revealed right greater than left pleural effusion with compressive volume loss and underwent thoracentesis on 8/2 with 1.1 L removed.  On transfer to [name] he was medically optimized from cardiology and nephrology standpoint and underwent TAVR on 6/22.  Postop day 1 patient seen with episode of presyncope felt secondary to hypovolemia for which he received IV hydration.  The following day, postop day 2, patient had 2 additional episodes of presyncope with development of persistent hypotension and hypoxia precipitating cardiology consultation to critical care and transfer to ICU.  Concern for retroperitoneal bleed as well given drop  in hemoglobin.  Past Medical History  Chronic atrial fibrillation anticoagulated on Coumadin Bradycardia status post pacemaker placement Systolic congestive heart failure Type 2 diabetes Hypertension Severe aortic stenosis now status post TAVR Chronic kidney disease stage III Gout COPD  Daily tobacco abuse Obstructive sleep apnea with nocturnal CPAP, reports compliance Hyperlipidemia CAD status post PCI  Significant Hospital Events   Admitted 6/12 from outside facility  Consults:  Cardiology Nephrology PCCM Cardiothoracic surgery Dental medicine  Procedures:  Dental extraction of teeth 11, 13, and 14 6/16 TAVR 6/21  Significant Diagnostic Tests:  6/23 echo: LVEF 40-45% RV mildly reduced, RVSP 44 6/24 ct abd/pelvis: 1. No acute intra-abdominal or pelvic pathology. No intraperitoneal or retroperitoneal fluid collection or hematoma. 2. Partially visualized bilateral pleural effusions with findings concerning for multifocal pneumonia. Clinical correlation and follow-up to resolution recommended. 3. Diffuse subcutaneous edema and anasarca. 4. Aortic Atherosclerosis (ICD10-I70.0). Micro Data:  Covid 6/12 > negative Surgical PCR 6/16 > negative MRSA PCR screening 6/21 > negative Sputum 6/26>> steno, serratia   Antimicrobials:  Cefuroxime 6/22->6/24 cefepime 6/26-> vanc 6/26  Interim history/subjective:  6/29: on 10L this am sats 92%, wheezing on nebulizer treatment this am. Pt states rested well last evening. Still without appetite (did not touch breakfast) 6/28: on 11L [place] with sats 100%, turned down to 9L. Utilizing flutter and IS well, endorses feels better overall. Does utilize cpap at home but not oxygen previously. Up oob this am. Decreased appetite. Remains on milrinone, lasix and amio gtt's 6/27:Had episode of desaturations requiring BIPAP last night Now improved Down to 6L O2  Congestion improving Net -1L  Objective   Blood pressure 125/76, pulse (!)  106, temperature 98.2 F (36.8 C), temperature source Axillary, resp. rate 14, height  5\' 6"  (1.676 m), weight 77.7 kg, SpO2 97 %.        Intake/Output Summary (Last 24 hours) at 12/07/2019 0758 Last data filed at 12/07/2019 0700 Gross per 24 hour  Intake 928.25 ml  Output 1850 ml  Net -921.75 ml   Filed Weights   12/05/19 0500 12/06/19 0500 12/07/19 0500  Weight: 81.7 kg 79 kg 77.7 kg    Examination: GEN: no acute distress reclining comfortably in bed in nad on nebulizer treatment HEENT: MMM, NCAT, EOMI, PERRLA CV: RRR, +SEM, ext warm PULM:  B/L expiratory wheeze, no accessory muscle use GI: Soft, hypoactive BS, has bruising along groin EXT: + diffuse edema NEURO: has tremor of right arm, chronic, extremely weak PSYCH:  poor insight, normal mood and affect SKIN: no rashes  Lab Results  Component Value Date   WBC 12.5 (H) 12/07/2019   HGB 8.1 (L) 12/07/2019   HCT 24.8 (L) 12/07/2019   MCV 90.5 12/07/2019   PLT 48 (L) 12/07/2019   Lab Results  Component Value Date   CREATININE 3.67 (H) 12/07/2019   BUN 84 (H) 12/07/2019   NA 135 12/07/2019   K 3.6 12/07/2019   CL 94 (L) 12/07/2019   CO2 26 12/07/2019   Lab Results  Component Value Date   INR 1.6 (H) 12/07/2019   INR 1.7 (H) 12/06/2019   INR 2.2 (H) 12/05/2019    Resolved Hospital Problem list     Assessment & Plan:   Shock: cardiogenic +/- hemorrhagic- hemorrhagic component improved.  Global body volume overload S/p TAVR Afib: amio gtt baseline biventricular failure- RV probably related more to COPD - Inotropes and diuretics per cardiology/hf team -no coox today yet - Hgb goal > 7  Thrombocytopenia -decreased Coagulopathy with elevated  INR- last coumadin 6/23 - Trend for now, hemolysis panel equivocal, no schistocytes on smear -inr 1.6  Severe muscular deconditioning- PT and progressive mobility  Anemia, flank bruising- monitor clinically, no significant hematoma on CT  COPD- on HCAP coverage,  Pct mildly elevated -cxr in am -abx ongoing - Encourage IS and flutter - OOB as much as tolerated during day - Wean O2 - BIPAP PRN - Nebs, steroids as ordered Serratia and steno pneumonia:  -added levaquin awaiting sensitivities to ceftazidime  -qt this am 450 (6/29)  Acute on chronic kidney injury: -Cr worsening today to 3.7 -diminishing return on same dose lasix with 1.2L less than previous 24hr -remains on lasix gtt, will d/w HF  Best practice:  Diet: Cardiac Pain/Anxiety/Delirium protocol (if indicated): As needed VAP protocol (if indicated): Not applicable DVT prophylaxis: SCDs GI prophylaxis: PPI Glucose control: SSI Mobility: Bedrest Code Status: Full code Family Communication: Per primary Disposition: ICU pending inotrope wean and consistent improvement in resp status  Critical care time: The patient is critically ill with multiple organ systems failure and requires high complexity decision making for assessment and support, frequent evaluation and titration of therapies, application of advanced monitoring technologies and extensive interpretation of multiple databases.  Critical care time 34 mins. This represents my time independent of the NPs time taking care of the pt. This is excluding procedures.    [facility] 12/07/2019, 7:58 AM

## 2019-12-07 NOTE — Progress Notes (Addendum)
Patient ID: Justin Boone, male   DOB: 03-03-1951, 69 y.o.   MRN: [id number]     Advanced Heart Failure Rounding Note  PCP-Cardiologist: Sherren Mocha, MD   Subjective:    Underwent TAVR 11/14/2019  On milrinone 0.125 for RV support. Co-ox 69%.   Diuresis slowing. Only 1.9L in UOP yesterday w/ IV Lasix. SCr bumped from 3.25>>3.67. CVP ~8   On abx for PNA. Sputum culture + for stenotrophamonas and serratia. AF. WBC trending down, 15>>12K.  Platelets continue to trend down, now at 48K. Hgb fairly stable at 8.1. No bleeding.  OOB sitting in chair. Feeling better. Less dyspnea.   Objective:   Weight Range: 77.7 kg Body mass index is 27.65 kg/m.   Vital Signs:   Temp:  [98.1 F (36.7 C)-98.3 F (36.8 C)] 98.1 F (36.7 C) (06/29 0757) Pulse Rate:  [103-125] 114 (06/29 0804) Resp:  [14-28] 20 (06/29 0804) BP: (116-136)/(65-101) 125/76 (06/29 0700) SpO2:  [72 %-99 %] 97 % (06/29 0700) Weight:  [77.7 kg] 77.7 kg (06/29 0500) Last BM Date: 12/05/19  Weight change: Filed Weights   12/05/19 0500 12/06/19 0500 12/07/19 0500  Weight: 81.7 kg 79 kg 77.7 kg    Intake/Output:   Intake/Output Summary (Last 24 hours) at 12/07/2019 0920 Last data filed at 12/07/2019 0700 Gross per 24 hour  Intake 928.25 ml  Output 1750 ml  Net -821.75 ml      Physical Exam   CVP 8 General:  OOB sitting in chair. No respiratory difficulty HEENT: normal  Neck: supple. JVD ~8 cm.  + Rt IJ CVC. Carotids 2+ bilat; no bruits. No lymphadenopathy or thyromegaly appreciated. Cor: PMI nondisplaced. Irregular rhythm, tachy rate. No rubs, gallops or murmurs. Lungs: decreased BS rt lower lung. No wheezing  Abdomen: soft, nontender, nondistended. No hepatosplenomegaly. No bruits or masses. Good bowel sounds. Extremities: no cyanosis, clubbing, rash bilateral TED hoses w/ 1+ edema surrounding both knees Neuro: alert & oriented x 3, cranial nerves grossly intact. moves all 4 extremities w/o difficulty. Affect  pleasant.   Telemetry   Atrial fibrillation with BiV pacing 100-110  Personally reviewed  Labs    CBC Recent Labs    12/06/19 0915 12/07/19 0500  WBC 15.0* 12.5*  NEUTROABS  --  11.8*  HGB 8.2* 8.1*  HCT 25.3* 24.8*  MCV 89.7 90.5  PLT 58* 48*   Basic Metabolic Panel Recent Labs    12/06/19 0315 12/07/19 0500  NA 136 135  K 3.7 3.6  CL 97* 94*  CO2 26 26  GLUCOSE 115* 107*  BUN 80* 84*  CREATININE 3.25* 3.67*  CALCIUM 8.0* 8.2*  MG 2.3 2.2  PHOS 3.8 4.3   Liver Function Tests No results for input(s): AST, ALT, ALKPHOS, BILITOT, PROT, ALBUMIN in the last 72 hours. No results for input(s): LIPASE, AMYLASE in the last 72 hours. Cardiac Enzymes No results for input(s): CKTOTAL, CKMB, CKMBINDEX, TROPONINI in the last 72 hours.  BNP: BNP (last 3 results) Recent Labs    11/09/2019 2002 11/29/19 1513  BNP 3,282.1* 2,503.4*    ProBNP (last 3 results) No results for input(s): PROBNP in the last 8760 hours.   D-Dimer No results for input(s): DDIMER in the last 72 hours. Hemoglobin A1C No results for input(s): HGBA1C in the last 72 hours. Fasting Lipid Panel No results for input(s): CHOL, HDL, LDLCALC, TRIG, CHOLHDL, LDLDIRECT in the last 72 hours. Thyroid Function Tests No results for input(s): TSH, T4TOTAL, T3FREE, THYROIDAB in the last 72 hours.  Invalid input(s): FREET3  Other results:   Imaging    No results found.   Medications:     Scheduled Medications: . sodium chloride   Intravenous Once  . atorvastatin  80 mg Oral q1800  . Chlorhexidine Gluconate Cloth  6 each Topical Daily  . Chlorhexidine Gluconate Cloth  6 each Topical Q0600  . clopidogrel  75 mg Oral Daily  . DULoxetine  60 mg Oral Daily  . insulin aspart  0-9 Units Subcutaneous TID WC  . ipratropium  0.5 mg Nebulization TID  . levalbuterol  0.63 mg Nebulization TID  . mouth rinse  15 mL Mouth Rinse BID  . mometasone-formoterol  2 puff Inhalation BID  . multivitamin with  minerals  1 tablet Oral Daily  . nicotine  21 mg Transdermal Daily  . pantoprazole  40 mg Oral Daily  . potassium chloride  40 mEq Oral Once  . predniSONE  40 mg Oral Q breakfast  . sodium chloride flush  10-40 mL Intracatheter Q12H  . sodium chloride flush  3 mL Intravenous Q12H  . tamsulosin  0.4 mg Oral QPC breakfast    Infusions: . sodium chloride    . amiodarone 30 mg/hr (12/07/19 0824)  . furosemide (LASIX) infusion 10 mg/hr (12/07/19 0700)  . levofloxacin (LEVAQUIN) IV Stopped (12/06/19 1159)  . milrinone 0.125 mcg/kg/min (12/07/19 0700)  . norepinephrine (LEVOPHED) Adult infusion Stopped (12/02/19 1450)  . sodium chloride irrigation      PRN Medications: sodium chloride, acetaminophen **OR** acetaminophen, ipratropium-albuterol, melatonin, sodium chloride flush, sodium chloride flush   Assessment/Plan   1. Acute on chronic systolic CHF with prominent RV dysfunction: Has MDT CRT-P device.  Most recent echo with EF 40-45% with severely dilated RV with moderate systolic dysfunction.  RV function looks worse than pre-op but was not normal previously.  Hs-TnI not significantly elevated so doubt RV infarct/closure of RCA stent.  Suspect RV dysfunction is due to history of severe COPD.   - On milrinone 0.125 mcg/kg/min for RV support co-ox 69%.  - Still mildly fluid overloaded. UOP slowing. SCr trending up, 3.2>>3.7.  - Stop Lasix gtt. Change to 80 mg IV once daily for now. Monitor response.  2. AKI on CKD stage 3: Baseline creatinine 2.  Suspect multifactorial => cardiorenal, contrast, hypotension/ATN.   - Creatinine stable 3.33 -> 3.37->3.25->3.67 - stop Lasix gtt. Give 1 x dose of 80 IV and monitor BMP  - Support RV with milrinone.  - Diuresis, follow creatinine closely.  3. CAD: s/p PCI to RCA in 2/21. As above, HS-TnI not significantly elevated, no evidence for RV infarct.  - Denies s/s of angina - Continue Plavix, statin.  4. Atrial fibrillation, paroxysmal: A-paced on  admit, now in underlying afib with BiV pacing.  - Rate is elevated, 110s  - Continue IV amio while on milrinone  - Currently holding anticoagulation with anemia/concern for bleeding.  5. Anemia, acute blood loss: required transfusion. Hgb 8.1 today. Follow closely  - No source of bleeding found yet.  No RP hematoma on CT.  6. Thrombocytopenia: Plts continue to trend down, 48K today. Suspect due to critical illness.  No bleeding  7. Severe AS: S/p TAVR. Bioprosthetic aortic valve stable on post-op echo.  8. COPD with acute on chronic respiratory failure: Severe, recently quit smoking.   - Prednisone begun 6/26 by CCM.  - continue breathing treatments  9. ID: Concern for HCAP. Bilateral airspace disease on CXR.  - Sputum culture + for stenotrophamonas and serratia.  AF - PCT 0.85  - PCCM managing abx. Added levaquin awaiting sensitivities to ceftazidime    Length of Stay: [address] PA-C  12/07/2019, 9:20 AM  Advanced Heart Failure Team Pager [phone] (M-F; 7a - 4p)  Please contact [facility] for night-coverage after hours (4p -7a ) and weekends on [email or web address]  Patient seen and examined with the above-signed Advanced Practice Provider and/or Housestaff. I personally reviewed laboratory data, imaging studies and relevant notes. I independently examined the patient and formulated the important aspects of the plan. I have edited the note to reflect any of my changes or salient points. I have personally discussed the plan with the patient and/or family.  He looks much better today. Volume status improved. Co-ox 69% on milrinone. Creatinine back up to 3.7. CVP 8. Breathing better. LE edema much improved. Remains in AF. On IV amio. No AC with recent bleeding and low platelets  General:  Sitting in chair No resp difficulty HEENT: normal Neck: supple. JVP 8. Carotids 2+ bilat; no bruits. No lymphadenopathy or thryomegaly appreciated. Cor: PMI nondisplaced. Irregular tachy No rubs,  gallops or murmurs. Lungs: decreased  Abdomen: soft, nontender, nondistended. No hepatosplenomegaly. No bruits or masses. Good bowel sounds. Extremities: no cyanosis, clubbing, rash, trace edema R groin hematoma/ecchymosis stable  Neuro: alert & orientedx3, cranial nerves grossly intact. moves all 4 extremities w/o difficulty. Affect pleasant  Clinically improved. Volume status much better. Creatinine worsening despite adequate co-ox. Will hold IV lasix today. Continue broad. Abx, Once creatinine improving can begin milrinone wean. No a/c for now. Mobilize.  Glori Bickers, MD  12:03 PM

## 2019-12-08 ENCOUNTER — Inpatient Hospital Stay (HOSPITAL_COMMUNITY): Payer: Medicare HMO

## 2019-12-08 LAB — CULTURE, RESPIRATORY W GRAM STAIN: Gram Stain: NONE SEEN

## 2019-12-08 LAB — CBC WITH DIFFERENTIAL/PLATELET
Abs Immature Granulocytes: 0.07 10*3/uL (ref 0.00–0.07)
Basophils Absolute: 0 10*3/uL (ref 0.0–0.1)
Basophils Relative: 0 %
Eosinophils Absolute: 0 10*3/uL (ref 0.0–0.5)
Eosinophils Relative: 0 %
HCT: 23 % — ABNORMAL LOW (ref 39.0–52.0)
Hemoglobin: 7.5 g/dL — ABNORMAL LOW (ref 13.0–17.0)
Immature Granulocytes: 1 %
Lymphocytes Relative: 3 %
Lymphs Abs: 0.3 10*3/uL — ABNORMAL LOW (ref 0.7–4.0)
MCH: 29.5 pg (ref 26.0–34.0)
MCHC: 32.6 g/dL (ref 30.0–36.0)
MCV: 90.6 fL (ref 80.0–100.0)
Monocytes Absolute: 0.3 10*3/uL (ref 0.1–1.0)
Monocytes Relative: 2 %
Neutro Abs: 10.4 10*3/uL — ABNORMAL HIGH (ref 1.7–7.7)
Neutrophils Relative %: 94 %
Platelets: 48 10*3/uL — ABNORMAL LOW (ref 150–400)
RBC: 2.54 MIL/uL — ABNORMAL LOW (ref 4.22–5.81)
RDW: 16.9 % — ABNORMAL HIGH (ref 11.5–15.5)
WBC: 11 10*3/uL — ABNORMAL HIGH (ref 4.0–10.5)
nRBC: 0.3 % — ABNORMAL HIGH (ref 0.0–0.2)

## 2019-12-08 LAB — GLUCOSE, CAPILLARY
Glucose-Capillary: 103 mg/dL — ABNORMAL HIGH (ref 70–99)
Glucose-Capillary: 118 mg/dL — ABNORMAL HIGH (ref 70–99)
Glucose-Capillary: 130 mg/dL — ABNORMAL HIGH (ref 70–99)
Glucose-Capillary: 211 mg/dL — ABNORMAL HIGH (ref 70–99)

## 2019-12-08 LAB — RETICULOCYTES
Immature Retic Fract: 17.7 % — ABNORMAL HIGH (ref 2.3–15.9)
RBC.: 2.53 MIL/uL — ABNORMAL LOW (ref 4.22–5.81)
Retic Count, Absolute: 36.7 10*3/uL (ref 19.0–186.0)
Retic Ct Pct: 1.5 % (ref 0.4–3.1)

## 2019-12-08 LAB — PROTIME-INR
INR: 1.5 — ABNORMAL HIGH (ref 0.8–1.2)
Prothrombin Time: 17.5 seconds — ABNORMAL HIGH (ref 11.4–15.2)

## 2019-12-08 LAB — BASIC METABOLIC PANEL
Anion gap: 11 (ref 5–15)
BUN: 90 mg/dL — ABNORMAL HIGH (ref 8–23)
CO2: 29 mmol/L (ref 22–32)
Calcium: 8.1 mg/dL — ABNORMAL LOW (ref 8.9–10.3)
Chloride: 93 mmol/L — ABNORMAL LOW (ref 98–111)
Creatinine, Ser: 4.13 mg/dL — ABNORMAL HIGH (ref 0.61–1.24)
GFR calc Af Amer: 16 mL/min — ABNORMAL LOW (ref >60)
GFR calc non Af Amer: 14 mL/min — ABNORMAL LOW (ref >60)
Glucose, Bld: 104 mg/dL — ABNORMAL HIGH (ref 70–99)
Potassium: 4.3 mmol/L (ref 3.5–5.1)
Sodium: 133 mmol/L — ABNORMAL LOW (ref 135–145)

## 2019-12-08 LAB — VITAMIN B12: Vitamin B-12: 372 pg/mL (ref 180–914)

## 2019-12-08 LAB — IRON AND TIBC
Iron: 8 ug/dL — ABNORMAL LOW (ref 45–182)
Saturation Ratios: 6 % — ABNORMAL LOW (ref 17.9–39.5)
TIBC: 127 ug/dL — ABNORMAL LOW (ref 250–450)
UIBC: 119 ug/dL

## 2019-12-08 LAB — MAGNESIUM: Magnesium: 2.5 mg/dL — ABNORMAL HIGH (ref 1.7–2.4)

## 2019-12-08 LAB — COOXEMETRY PANEL
Carboxyhemoglobin: 1.5 % (ref 0.5–1.5)
Methemoglobin: 0.8 % (ref 0.0–1.5)
O2 Saturation: 72.5 %
Total hemoglobin: 10.8 g/dL — ABNORMAL LOW (ref 12.0–16.0)

## 2019-12-08 LAB — PREPARE RBC (CROSSMATCH)

## 2019-12-08 LAB — HEMOGLOBIN AND HEMATOCRIT, BLOOD
HCT: 24.4 % — ABNORMAL LOW (ref 39.0–52.0)
Hemoglobin: 7.9 g/dL — ABNORMAL LOW (ref 13.0–17.0)

## 2019-12-08 LAB — PHOSPHORUS: Phosphorus: 4.1 mg/dL (ref 2.5–4.6)

## 2019-12-08 LAB — FERRITIN: Ferritin: 321 ng/mL (ref 24–336)

## 2019-12-08 LAB — FOLATE: Folate: 7.3 ng/mL (ref >5.9)

## 2019-12-08 MED ORDER — SODIUM CHLORIDE 0.9% IV SOLUTION: Freq: Once | INTRAVENOUS | Status: AC

## 2019-12-08 MED ORDER — LEVALBUTEROL HCL 0.63 MG/3ML IN NEBU
0.6300 mg | INHALATION_SOLUTION | Freq: Four times a day (QID) | RESPIRATORY_TRACT | Status: DC | PRN
  Administered 2019-12-08 – 2019-12-12 (×3): 0.63 mg via RESPIRATORY_TRACT
  Filled 2019-12-08 (×5): qty 3

## 2019-12-08 MED ORDER — IPRATROPIUM BROMIDE 0.02 % IN SOLN
0.5000 mg | Freq: Four times a day (QID) | RESPIRATORY_TRACT | Status: DC | PRN
  Administered 2019-12-09: 0.5 mg via RESPIRATORY_TRACT
  Filled 2019-12-08 (×2): qty 2.5

## 2019-12-08 NOTE — Progress Notes (Addendum)
Occupational Therapy Treatment Patient Details Name: Justin Boone MRN: [id number] DOB: Oct 29, 1950 Today's Date: 12/08/2019    History of present illness Pt is a 69 year old male with history of A. fib on Coumadin, CAD, bradycardia s/p  [employer] pacer, DM 2, HTN, severe AS, COPD, CKD stage IIIb is admitted to [facility] as a hospital to hospital transfer from [facility] for cardiology evaluation. s/p right-sided thoracentesis for symptomatic right-sided pleural effusion at OSH. 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   OT comments  Pt with slow progression towards OT goals, presents seated in recliner pleasant and agreeable to OT treatment session. Pt with reports of R shoulder pain/limited ROM with initial focus on RUE HEP while facilitating scapular movement to reduce pain. Pt engaging engaging in seated grooming ADL with minA for supporting RUE when reaching towards head, completed x2 sit<>stand at [place] with minA however too fatigued to attempt marching or to attempt standing ADL task. Utilized 10L HFNC for session with SpO2 maintaining >90%, HR fluctuating in the 110s (briefly into 120s), BP start of session 102/60, end of session 96/81 (pt denies dizziness throughout). Will continue to follow acutely, currently recommending [facility] after discharge however pending pt progression may need to consider ST rehab initially. Will follow.   Follow Up Recommendations  Home health OT;Supervision/Assistance - 24 hour (pending progress - may need post acute rehab)    Equipment Recommendations  3 in 1 bedside commode          Precautions / Restrictions Precautions Precautions: Fall Precaution Comments: watch 02 and BP (orthostatic)       Mobility Bed Mobility               General bed mobility comments: in chair on arrival and end of session  Transfers Overall transfer level: Needs assistance Equipment used: Rolling walker (2 wheeled) Transfers: Sit to/from  Stand   Stand pivot transfers: Min assist       General transfer comment: increased time/effort for transition of UEs to RW with steadying assist throughout. completed x2 from recliner     Balance Overall balance assessment: Needs assistance Sitting-balance support: No upper extremity supported;Feet supported Sitting balance-Leahy Scale: Good     Standing balance support: During functional activity;Bilateral upper extremity supported Standing balance-Leahy Scale: Poor Standing balance comment: RW for support and safety                           ADL either performed or assessed with clinical judgement   ADL Overall ADL's : Needs assistance/impaired     Grooming: Minimal assistance;Sitting;Brushing hair;Wash/dry face Grooming Details (indicate cue type and reason): assist to support R elbow when using RUE for grooming tasks given R shoulder weakness              Lower Body Dressing: Moderate assistance;Sitting/lateral leans;Sit to/from stand Lower Body Dressing Details (indicate cue type and reason): pt able to don his L sock, required assist to don R sock             Functional mobility during ADLs: Minimal assistance;Rolling walker (sit<>stands) General ADL Comments: pt continues to fatigue rather quickly and requiring rest breaks with activity, completed x2 sit<>stands from recliner during session but pt too fatigued to attempt marching in place                        Cognition Arousal/Alertness: Awake/alert Behavior During Therapy:  WFL for tasks assessed/performed Overall Cognitive Status: Within Functional Limits for tasks assessed                                          Exercises Exercises: General Upper Extremity;Other exercises General Exercises - Upper Extremity Shoulder Flexion: AAROM;Right;AROM;Left;10 reps (to approx 75* (within pain tolerance, assist at scapula) ) Digit Composite Flexion: AROM;Both;10  reps Composite Extension: AROM;Both;10 reps Other Exercises Other Exercises: lap slides RUE progressing to shoulder flexion, x10, assist/faciliation at scapula    Shoulder Instructions       General Comments      Pertinent Vitals/ Pain       Pain Assessment: Faces Faces Pain Scale: Hurts even more Pain Location: R shoulder with certain movements Pain Descriptors / Indicators: Discomfort;Sore Pain Intervention(s): Repositioned;Monitored during session  Home Living                                          Prior Functioning/Environment              Frequency  Min 3X/week        Progress Toward Goals  OT Goals(current goals can now be found in the care plan section)  Progress towards OT goals: Progressing toward goals  Acute Rehab OT Goals Patient Stated Goal: to go home OT Goal Formulation: With patient Time For Goal Achievement: 12/17/19 Potential to Achieve Goals: Good ADL Goals Pt Will Perform Grooming: with min guard assist;standing Pt Will Perform Upper Body Bathing: with min guard assist;sitting Pt Will Perform Lower Body Bathing: with min guard assist;with adaptive equipment;sit to/from stand Pt Will Transfer to Toilet: with min assist;ambulating;bedside commode Additional ADL Goal #1: pt will complete bed mobility supervision level as precursor to adls  Plan Discharge plan remains appropriate (pending progress )    Co-evaluation                 AM-PAC OT "6 Clicks" Daily Activity     Outcome Measure   Help from another person eating meals?: None Help from another person taking care of personal grooming?: A Little Help from another person toileting, which includes using toliet, bedpan, or urinal?: A Lot Help from another person bathing (including washing, rinsing, drying)?: A Lot Help from another person to put on and taking off regular upper body clothing?: A Little Help from another person to put on and taking off regular  lower body clothing?: A Lot 6 Click Score: 16    End of Session Equipment Utilized During Treatment: Oxygen;Gait belt;Rolling walker  OT Visit Diagnosis: Unsteadiness on feet (R26.81);Muscle weakness (generalized) (M62.81);Pain Pain - Right/Left: Right Pain - part of body: Shoulder   Activity Tolerance Patient tolerated treatment well;Patient limited by fatigue   Patient Left in chair;with call bell/phone within reach;with nursing/sitter in room   Nurse Communication Mobility status        Time: 6237-6283 OT Time Calculation (min): 33 min  Charges: OT General Charges $OT Visit: 1 Visit OT Treatments $Self Care/Home Management : 8-22 mins $Therapeutic Activity: 8-22 mins  Lou Cal, OT Acute Rehabilitation Services Pager [phone] Office [phone]   Raymondo Band 12/08/2019, 12:45 PM

## 2019-12-08 NOTE — Progress Notes (Signed)
D/w pharmacy recommend continuing levofloxacin regardless of steno ceftazidime susceptibility.   Will closely follow qt 480 today.

## 2019-12-08 NOTE — Progress Notes (Addendum)
Patient ID: Justin Boone, male   DOB: 1950-11-12, 69 y.o.   MRN: [id number]     Advanced Heart Failure Rounding Note  PCP-Cardiologist: Sherren Mocha, MD   Subjective:    Underwent TAVR 12/08/2019  On milrinone 0.125 for RV support. Co-ox 72.5% .  On abx for PNA. Sputum culture + for stenotrophamonas and serratia. AF. WBC trending down, 11.  Platelets continue to trend down, now at 48K. Hgb 7.5. Getting 1u RBC. Iron low  Yesterday given 80 mg IV lasix then stopped. Creatinine trending up 3.2 -> 3.7 -> 4.13.   Feeling better today.   Objective:   Weight Range: 78.1 kg Body mass index is 27.79 kg/m.   Vital Signs:   Temp:  [97.5 F (36.4 C)-98.5 F (36.9 C)] 98.5 F (36.9 C) (06/30 0747) Pulse Rate:  [95-120] 95 (06/30 0800) Resp:  [12-27] 27 (06/30 0800) BP: (92-126)/(58-82) 115/70 (06/30 0800) SpO2:  [71 %-100 %] 100 % (06/30 0807) FiO2 (%):  [50 %] 50 % (06/29 2259) Weight:  [78.1 kg] 78.1 kg (06/30 0652) Last BM Date: 12/07/19  Weight change: Filed Weights   12/06/19 0500 12/07/19 0500 12/08/19 0652  Weight: 79 kg 77.7 kg 78.1 kg    Intake/Output:   Intake/Output Summary (Last 24 hours) at 12/08/2019 0823 Last data filed at 12/08/2019 0200 Gross per 24 hour  Intake 529.91 ml  Output 675 ml  Net -145.09 ml      Physical Exam  CVP 10 General: Sitting in the chair. No resp difficulty HEENT: normal Neck: supple. JVP 9-10 . Carotids 2+ bilat; no bruits. No lymphadenopathy or thryomegaly appreciated. Cor: PMI nondisplaced. Irregular rate & rhythm. No rubs, gallops or murmurs. Lungs: clear on 8 liters [place].  Abdomen: soft, nontender, nondistended. No hepatosplenomegaly. No bruits or masses. Good bowel sounds. Extremities: no cyanosis, clubbing, rash, edema. RUE PICC Neuro: alert & orientedx3, cranial nerves grossly intact. moves all 4 extremities w/o difficulty. Affect pleasant    Telemetry   A fib 100s   Labs    CBC Recent Labs    12/07/19 0500  12/08/19 0533  WBC 12.5* 11.0*  NEUTROABS 11.8* 10.4*  HGB 8.1* 7.5*  HCT 24.8* 23.0*  MCV 90.5 90.6  PLT 48* 48*   Basic Metabolic Panel Recent Labs    12/07/19 0500 12/08/19 0533  NA 135 133*  K 3.6 4.3  CL 94* 93*  CO2 26 29  GLUCOSE 107* 104*  BUN 84* 90*  CREATININE 3.67* 4.13*  CALCIUM 8.2* 8.1*  MG 2.2 2.5*  PHOS 4.3 4.1   Liver Function Tests No results for input(s): AST, ALT, ALKPHOS, BILITOT, PROT, ALBUMIN in the last 72 hours. No results for input(s): LIPASE, AMYLASE in the last 72 hours. Cardiac Enzymes No results for input(s): CKTOTAL, CKMB, CKMBINDEX, TROPONINI in the last 72 hours.  BNP: BNP (last 3 results) Recent Labs    11/17/2019 2002 11/29/19 1513  BNP 3,282.1* 2,503.4*    ProBNP (last 3 results) No results for input(s): PROBNP in the last 8760 hours.   D-Dimer No results for input(s): DDIMER in the last 72 hours. Hemoglobin A1C No results for input(s): HGBA1C in the last 72 hours. Fasting Lipid Panel No results for input(s): CHOL, HDL, LDLCALC, TRIG, CHOLHDL, LDLDIRECT in the last 72 hours. Thyroid Function Tests No results for input(s): TSH, T4TOTAL, T3FREE, THYROIDAB in the last 72 hours.  Invalid input(s): FREET3  Other results:   Imaging    No results found.   Medications:  Scheduled Medications: . sodium chloride   Intravenous Once  . atorvastatin  80 mg Oral q1800  . Chlorhexidine Gluconate Cloth  6 each Topical Daily  . Chlorhexidine Gluconate Cloth  6 each Topical Q0600  . clopidogrel  75 mg Oral Daily  . DULoxetine  60 mg Oral Daily  . feeding supplement (ENSURE ENLIVE)  237 mL Oral BID BM  . guaiFENesin  600 mg Oral BID  . insulin aspart  0-9 Units Subcutaneous TID WC  . ipratropium  0.5 mg Nebulization TID  . levalbuterol  0.63 mg Nebulization TID  . mouth rinse  15 mL Mouth Rinse BID  . mometasone-formoterol  2 puff Inhalation BID  . multivitamin with minerals  1 tablet Oral Daily  . nicotine  21 mg  Transdermal Daily  . pantoprazole  40 mg Oral Daily  . sodium chloride flush  10-40 mL Intracatheter Q12H  . sodium chloride flush  3 mL Intravenous Q12H  . tamsulosin  0.4 mg Oral QPC breakfast    Infusions: . sodium chloride    . amiodarone 30 mg/hr (12/07/19 2127)  . levofloxacin (LEVAQUIN) IV Stopped (12/06/19 1159)  . milrinone 0.125 mcg/kg/min (12/07/19 2128)    PRN Medications: sodium chloride, acetaminophen **OR** acetaminophen, ipratropium-albuterol, melatonin, sodium chloride flush, sodium chloride flush   Assessment/Plan   1. Acute on chronic systolic CHF with prominent RV dysfunction: Has MDT CRT-P device.  Most recent echo with EF 40-45% with severely dilated RV with moderate systolic dysfunction.  RV function looks worse than pre-op but was not normal previously.  Hs-TnI not significantly elevated so doubt RV infarct/closure of RCA stent.  Suspect RV dysfunction is due to history of severe COPD.   - On milrinone 0.125 mcg/kg/min for RV support.  - CO-OX 72.5%.  - Creatinine continues to trend up. Hold diuretic for now. CVP 10-11 . With RV failure this may be as good as we can get him.  2. AKI on CKD stage 3: Baseline creatinine 2.  Suspect multifactorial => cardiorenal, contrast, hypotension/ATN.   - Creatinine stable 3.33 -> 3.37->3.25->3.67-> 4.13  -Hold diuretic for now.  - Support RV with milrinone.  3. CAD: s/p PCI to RCA in 2/21. As above, HS-TnI not significantly elevated, no evidence for RV infarct.  - Denies s/s of angina - Continue Plavix, statin.  4. Atrial fibrillation, paroxysmal: A-paced on admit, now in underlying afib with BiV pacing.  -  Continue IV amio while on milrinone  - Currently holding anticoagulation with anemia/concern for bleeding.  5. Anemia, acute blood loss: required transfusion.  -Hgb 7.5 today. Check anemia panel.  Give 1 UPRBCs.  - Check FOBT - No source of bleeding found yet.  No RP hematoma on CT.  6. Thrombocytopenia: Plts  continue to trend down, 48 K today. Suspect due to critical illness.  No bleeding  7. Severe AS: S/p TAVR. Bioprosthetic aortic valve stable on post-op echo.  8. COPD with acute on chronic respiratory failure: Severe, recently quit smoking.   - Prednisone begun 6/26 by CCM.  - continue breathing treatments  9. ID: Concern for HCAP. Bilateral airspace disease on CXR.  - Sputum culture + for stenotrophamonas and serratia. AF - Sensitivities pending.  - PCT 0.85  - PCCM managing abx. Continue levaquin awaiting sensitivities .   Length of Stay: [place], NP  12/08/2019, 8:23 AM  Advanced Heart Failure Team Pager [phone] (M-F; 7a - 4p)  Please contact [facility] for night-coverage after hours (4p -  7a ) and weekends on [email or web address]   Agree with above. Feeling a bit better but remains very tenuous with severe PNA, RV failure with rising creatinine, ongoing anemia and AF with RVR despite amio gtt.   General:  Sitting in chair  Speaking in full sentences HEENT: normal Neck: supple. JVP 10. Carotids 2+ bilat; no bruits. No lymphadenopathy or thryomegaly appreciated. Cor: PMI nondisplaced. irregular tachy. No rubs, gallops or murmurs. Lungs: decrease BS throughout. No wheeze Abdomen: soft, nontender, nondistended. No hepatosplenomegaly. No bruits or masses. Good bowel sounds. Rflank and groin ecchymosis Extremities: no cyanosis, clubbing, rash, tr edema  Bandaged wound on left LE Neuro: alert & orientedx3, cranial nerves grossly intact. moves all 4 extremities w/o difficulty. Affect pleasant  Remains very tenuous. Stop diuretics. Continue milrinone for RV support. Hopefully renal function will improve soon. Continue for AF with RVR. Not candidate for [facility] with ongoing anemia. Give IV iron. Continue abx. Mobilize as tolerated.   CRITICAL CARE Performed by: Glori Bickers  Total critical care time: 35 minutes  Critical care time was exclusive of separately billable procedures and  treating other patients.  Critical care was necessary to treat or prevent imminent or life-threatening deterioration.  Critical care was time spent personally by me (independent of midlevel providers or residents) on the following activities: development of treatment plan with patient and/or surrogate as well as nursing, discussions with consultants, evaluation of patient's response to treatment, examination of patient, obtaining history from patient or surrogate, ordering and performing treatments and interventions, ordering and review of laboratory studies, ordering and review of radiographic studies, pulse oximetry and re-evaluation of patient's condition.  Glori Bickers, MD  9:53 PM

## 2019-12-08 NOTE — Progress Notes (Signed)
Physical Therapy Treatment Patient Details Name: Justin Boone MRN: [id number] DOB: August 04, 1950 Today's Date: 12/08/2019    History of Present Illness Pt is a 69 year old male with history of A. 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 s/p right-sided thoracentesis for symptomatic right-sided pleural effusion at OSH. 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    PT Comments    Pt with improved ambulation tolerance today with max encouragement to overcome anxiety/fear of falling. RN present to assist with IV Pole and push chair. Pt able to amb 3 separate bouts on 10LO2 via [name], SpO2 >86%, recovers >90% upon sitting. Pt appreciative of the ambulation but states "I"m exhausted." Pt states that "It feels like my legs are going to give out." Pt with noted bilat LE edema however much improved from earlier in hospital stay. Acute PT to cont to follow and progress ambulation and activity tolerance.    Follow Up Recommendations  Home health PT;Supervision/Assistance - 24 hour     Equipment Recommendations  Rolling walker with 5" wheels    Recommendations for Other Services       Precautions / Restrictions Precautions Precautions: Fall Precaution Comments: watch 02, verbal cues for purse lipped breathing Restrictions Weight Bearing Restrictions: No    Mobility  Bed Mobility               General bed mobility comments: in chair on arrival and end of session  Transfers Overall transfer level: Needs assistance Equipment used: Rolling walker (2 wheeled) Transfers: Sit to/from Stand Sit to Stand: Min assist         General transfer comment: increased time, minA to power up and steady during transition of hands  Ambulation/Gait Ambulation/Gait assistance: Min assist;+2 safety/equipment Gait  Distance (Feet): 20 Feet (x2, 25x1) Assistive device: Rolling walker (2 wheeled) Gait Pattern/deviations: Step-through pattern;Decreased stride length;Narrow base of support;Trunk flexed Gait velocity: dec Gait velocity interpretation: <1.31 ft/sec, indicative of household ambulator General Gait Details: slow, cautious, impulsively sits due to "my legs are giving out", max directional verbal cues to breath in nose out mouth and to increase wide base of support in addition to staying in walker and not pushing it out in front to far. Pt    Stairs             Wheelchair Mobility    Modified Rankin (Stroke Patients Only)       Balance Overall balance assessment: Needs assistance Sitting-balance support: No upper extremity supported;Feet supported Sitting balance-Leahy Scale: Good     Standing balance support: During functional activity;Bilateral upper extremity supported Standing balance-Leahy Scale: Poor Standing balance comment: RW for support and safety                            Cognition Arousal/Alertness: Awake/alert Behavior During Therapy: WFL for tasks assessed/performed Overall Cognitive Status: Within Functional Limits for tasks assessed                                        Exercises      General Comments General comments (skin integrity, edema, etc.): VSS, pt receiving blood, RN present ot assist      Pertinent Vitals/Pain  Pain Assessment: Faces Faces Pain Scale: Hurts a little bit Pain Location: in general discomfort Pain Descriptors / Indicators: Discomfort;Sore Pain Intervention(s): Monitored during session    Home Living                      Prior Function            PT Goals (current goals can now be found in the care plan section) Acute Rehab PT Goals Patient Stated Goal: to go home Progress towards PT goals: Progressing toward goals    Frequency    Min 3X/week      PT Plan Current plan  remains appropriate    Co-evaluation              AM-PAC PT "6 Clicks" Mobility   Outcome Measure  Help needed turning from your back to your side while in a flat bed without using bedrails?: A Little Help needed moving from lying on your back to sitting on the side of a flat bed without using bedrails?: A Little Help needed moving to and from a bed to a chair (including a wheelchair)?: A Little Help needed standing up from a chair using your arms (e.g., wheelchair or bedside chair)?: A Little Help needed to walk in hospital room?: A Lot Help needed climbing 3-5 steps with a railing? : A Lot 6 Click Score: 16    End of Session Equipment Utilized During Treatment: Gait belt Activity Tolerance: Patient tolerated treatment well Patient left: in chair;with call bell/phone within reach Nurse Communication: Mobility status PT Visit Diagnosis: Muscle weakness (generalized) (M62.81);Other abnormalities of gait and mobility (R26.89)     Time: 1030-1056 PT Time Calculation (min) (ACUTE ONLY): 26 min  Charges:  $Gait Training: 23-37 mins                     Kittie Plater, PT, DPT Acute Rehabilitation Services Pager #: [phone] Office #: [phone]    Berline Lopes 12/08/2019, 2:02 PM

## 2019-12-08 NOTE — Progress Notes (Signed)
NAME:  Justin Boone, MRN:  [id number], DOB:  1951-06-03, LOS: 73 ADMISSION DATE:  11/29/2019, CONSULTATION DATE:  12/02/2010 REFERRING MD:  Dr. Sallyanne Kuster , CHIEF COMPLAINT:  Hypotension and hypoxia  Brief History   Patient is a 69 year old male who transferred from outside facility for consultation of cardiology and nephrology.  Underwent TAVR due to severe aortic stenosis on 6/22.  1 days following TAVR patient seen with presyncopal episodes.  On 6/24 patient again seen with presyncopal episode and development of hypoxia with persistent hypotension necessitating transfer to ICU and consult to PCCM  History of present illness   Justin Boone is a 69 year old male with a past medical history significant for atrial fibrillation on anticoagulation with Coumadin, bradycardia status post pacemaker, systolic congestive heart failure, type 2 diabetes, hypertension, severe aortic stenosis now status post TAVR, chronic kidney disease stage III, gout, COPD, obstructive sleep apnea on, hyperlipidemia, CAD status post PCI, and chronic tobacco abuse who initially presented with worsening dyspnea to outside facility.  On 6/12 patient was transferred to [name] for cardiology and nephrology consultations.  Prior to transfer patient had CT chest done without contrast on 11/15/2019 that revealed right greater than left pleural effusion with compressive volume loss and underwent thoracentesis on 8/2 with 1.1 L removed.  On transfer to [name] he was medically optimized from cardiology and nephrology standpoint and underwent TAVR on 6/22.  Postop day 1 patient seen with episode of presyncope felt secondary to hypovolemia for which he received IV hydration.  The following day, postop day 2, patient had 2 additional episodes of presyncope with development of persistent hypotension and hypoxia precipitating cardiology consultation to critical care and transfer to ICU.  Concern for retroperitoneal bleed as well given drop  in hemoglobin.  Past Medical History  Chronic atrial fibrillation anticoagulated on Coumadin Bradycardia status post pacemaker placement Systolic congestive heart failure Type 2 diabetes Hypertension Severe aortic stenosis now status post TAVR Chronic kidney disease stage III Gout COPD  Daily tobacco abuse Obstructive sleep apnea with nocturnal CPAP, reports compliance Hyperlipidemia CAD status post PCI  Significant Hospital Events   Admitted 6/12 from outside facility  Consults:  Cardiology Nephrology PCCM Cardiothoracic surgery Dental medicine  Procedures:  Dental extraction of teeth 11, 13, and 14 6/16 TAVR 6/21  Significant Diagnostic Tests:  6/23 echo: LVEF 40-45% RV mildly reduced, RVSP 44 6/24 ct abd/pelvis: 1. No acute intra-abdominal or pelvic pathology. No intraperitoneal or retroperitoneal fluid collection or hematoma. 2. Partially visualized bilateral pleural effusions with findings concerning for multifocal pneumonia. Clinical correlation and follow-up to resolution recommended. 3. Diffuse subcutaneous edema and anasarca. 4. Aortic Atherosclerosis (ICD10-I70.0). Micro Data:  Covid 6/12 > negative Surgical PCR 6/16 > negative MRSA PCR screening 6/21 > negative Sputum 6/26>> steno, serratia   Antimicrobials:  Cefuroxime 6/22->6/24 cefepime 6/26-> vanc 6/26 levaquin 6/28->  Interim history/subjective:  6/30: on 8L this am and sats 100%. coox 72. Stopped lasix gtt yesterday. Still indices cont to climb. plts stable. Pt without complaints this am, less sob oob in chair visiting with family via tele 6/29: on 10L this am sats 92%, wheezing on nebulizer treatment this am. Pt states rested well last evening. Still without appetite (did not touch breakfast) 6/28: on 11L [place] with sats 100%, turned down to 9L. Utilizing flutter and IS well, endorses feels better overall. Does utilize cpap at home but not oxygen previously. Up oob this am. Decreased appetite.  Remains on milrinone, lasix and amio gtt's 6/27:Had episode of  desaturations requiring BIPAP last night Now improved Down to 6L O2  Congestion improving Net -1L  Objective   Blood pressure 125/73, pulse 99, temperature 98.5 F (36.9 C), temperature source Oral, resp. rate (!) 24, height 5\' 6"  (1.676 m), weight 78.1 kg, SpO2 92 %. CVP:  [1 mmHg-7 mmHg] 7 mmHg  FiO2 (%):  [50 %] 50 %   Intake/Output Summary (Last 24 hours) at 12/08/2019 0810 Last data filed at 12/08/2019 0200 Gross per 24 hour  Intake 529.91 ml  Output 675 ml  Net -145.09 ml   Filed Weights   12/06/19 0500 12/07/19 0500 12/08/19 0652  Weight: 79 kg 77.7 kg 78.1 kg    Examination: GEN: no acute distress sitting up in bed without accessory muscle use HEENT: MMM, NCAT, EOMI, PERRLA CV: tachycardic , +SEM, ext warm PULM:  Clear but diminished bs bilaterally, no accessory muscle use GI: Soft, non distended bs active EXT: + diffuse edema but improved NEURO: has b/l tremor of upper extremities chronic, extremely weak PSYCH:  poor insight, normal mood and affect SKIN: no rashes  Lab Results  Component Value Date   WBC 11.0 (H) 12/08/2019   HGB 7.5 (L) 12/08/2019   HCT 23.0 (L) 12/08/2019   MCV 90.6 12/08/2019   PLT 48 (L) 12/08/2019   Lab Results  Component Value Date   CREATININE 4.13 (H) 12/08/2019   BUN 90 (H) 12/08/2019   NA 133 (L) 12/08/2019   K 4.3 12/08/2019   CL 93 (L) 12/08/2019   CO2 29 12/08/2019   Lab Results  Component Value Date   INR 1.5 (H) 12/08/2019   INR 1.6 (H) 12/07/2019   INR 1.7 (H) 12/06/2019    Resolved Hospital Problem list     Assessment & Plan:   Shock: cardiogenic +/- hemorrhagic- hemorrhagic component improved.  Global body volume overload S/p TAVR Afib: amio gtt, defer to cards for bolus with HR in 120's to 130's this am.  baseline biventricular failure- RV probably related more to COPD - Inotropes and diuretics per cardiology/hf team -coox 72 -remains on  milrinone gtt - Hgb goal > 7 Prolonged qt:  -monitor closely with addition of levaquin on amio infusion  -450 on 6/29  Thrombocytopenia -stable Coagulopathy with elevated  INR- last coumadin 6/23 - Trend for now, hemolysis panel equivocal, no schistocytes on smear -inr 1.6  Severe muscular deconditioning- PT and progressive mobility  Anemia, flank bruising- monitor clinically, no significant hematoma on CT  COPD- on HCAP coverage, Pct mildly elevated -cxr with persistent b/l infiltrates, personally reviewed by me.  -abx ongoing, still awaiting ceftazidime sensitivities - Encourage IS and flutter - OOB as much as tolerated during day - Wean O2 as tolerated for goal >88% - BIPAP PRN - Nebs continued -completed steroids Serratia and steno pneumonia:  -cont levaquin awaiting sensitivities to ceftazidime  -qt 450 (6/29)  Acute on chronic kidney injury: -Cr worsening today to 4.1 -off lasix  Best practice:  Diet: Cardiac Pain/Anxiety/Delirium protocol (if indicated): As needed VAP protocol (if indicated): Not applicable DVT prophylaxis: SCDs GI prophylaxis: PPI Glucose control: SSI Mobility: Bedrest Code Status: Full code Family Communication: Per primary Disposition: ICU pending inotrope wean and consistent improvement in resp status  Critical care time: The patient is critically ill with multiple organ systems failure and requires high complexity decision making for assessment and support, frequent evaluation and titration of therapies, application of advanced monitoring technologies and extensive interpretation of multiple databases.  Critical care time 32 mins.  This represents my time independent of the NPs time taking care of the pt. This is excluding procedures.    Audria Nine DO [facility] 12/08/2019, 8:10 AM

## 2019-12-09 LAB — CBC WITH DIFFERENTIAL/PLATELET
Abs Immature Granulocytes: 0.09 10*3/uL — ABNORMAL HIGH (ref 0.00–0.07)
Basophils Absolute: 0 10*3/uL (ref 0.0–0.1)
Basophils Relative: 0 %
Eosinophils Absolute: 0 10*3/uL (ref 0.0–0.5)
Eosinophils Relative: 0 %
HCT: 23.4 % — ABNORMAL LOW (ref 39.0–52.0)
Hemoglobin: 7.7 g/dL — ABNORMAL LOW (ref 13.0–17.0)
Immature Granulocytes: 1 %
Lymphocytes Relative: 3 %
Lymphs Abs: 0.3 10*3/uL — ABNORMAL LOW (ref 0.7–4.0)
MCH: 29.2 pg (ref 26.0–34.0)
MCHC: 32.9 g/dL (ref 30.0–36.0)
MCV: 88.6 fL (ref 80.0–100.0)
Monocytes Absolute: 0.3 10*3/uL (ref 0.1–1.0)
Monocytes Relative: 3 %
Neutro Abs: 11.4 10*3/uL — ABNORMAL HIGH (ref 1.7–7.7)
Neutrophils Relative %: 93 %
Platelets: 46 10*3/uL — ABNORMAL LOW (ref 150–400)
RBC: 2.64 MIL/uL — ABNORMAL LOW (ref 4.22–5.81)
RDW: 16.4 % — ABNORMAL HIGH (ref 11.5–15.5)
WBC: 12.1 10*3/uL — ABNORMAL HIGH (ref 4.0–10.5)
nRBC: 0.2 % (ref 0.0–0.2)

## 2019-12-09 LAB — BASIC METABOLIC PANEL
Anion gap: 12 (ref 5–15)
BUN: 102 mg/dL — ABNORMAL HIGH (ref 8–23)
CO2: 26 mmol/L (ref 22–32)
Calcium: 8 mg/dL — ABNORMAL LOW (ref 8.9–10.3)
Chloride: 93 mmol/L — ABNORMAL LOW (ref 98–111)
Creatinine, Ser: 5.12 mg/dL — ABNORMAL HIGH (ref 0.61–1.24)
GFR calc Af Amer: 12 mL/min — ABNORMAL LOW (ref >60)
GFR calc non Af Amer: 11 mL/min — ABNORMAL LOW (ref >60)
Glucose, Bld: 106 mg/dL — ABNORMAL HIGH (ref 70–99)
Potassium: 4.3 mmol/L (ref 3.5–5.1)
Sodium: 131 mmol/L — ABNORMAL LOW (ref 135–145)

## 2019-12-09 LAB — GLUCOSE, CAPILLARY
Glucose-Capillary: 208 mg/dL — ABNORMAL HIGH (ref 70–99)
Glucose-Capillary: 63 mg/dL — ABNORMAL LOW (ref 70–99)
Glucose-Capillary: 91 mg/dL (ref 70–99)
Glucose-Capillary: 84 mg/dL (ref 70–99)
Glucose-Capillary: 111 mg/dL — ABNORMAL HIGH (ref 70–99)

## 2019-12-09 LAB — PROTIME-INR
INR: 1.4 — ABNORMAL HIGH (ref 0.8–1.2)
Prothrombin Time: 16.8 seconds — ABNORMAL HIGH (ref 11.4–15.2)

## 2019-12-09 LAB — COOXEMETRY PANEL
Carboxyhemoglobin: 2.1 % — ABNORMAL HIGH (ref 0.5–1.5)
Methemoglobin: 1.2 % (ref 0.0–1.5)
O2 Saturation: 78.7 %
Total hemoglobin: 8 g/dL — ABNORMAL LOW (ref 12.0–16.0)

## 2019-12-09 LAB — SODIUM, URINE, RANDOM: Sodium, Ur: 11 mmol/L

## 2019-12-09 MED ORDER — SODIUM CHLORIDE 0.9 % IV SOLN
510.0000 mg | INTRAVENOUS | Status: DC
  Administered 2019-12-09: 510 mg via INTRAVENOUS
  Filled 2019-12-09: qty 17

## 2019-12-09 NOTE — Progress Notes (Addendum)
Patient ID: Justin Boone, male   DOB: 1951/04/04, 69 y.o.   MRN: [id number]     Advanced Heart Failure Rounding Note  PCP-Cardiologist: Sherren Mocha, MD   Subjective:    Underwent TAVR 11/28/2019  On milrinone 0.125 for RV support. Co-ox 79%   Diuretics held yesterday for rising SCr/BUN. Continues to rise, SCr  3.2 -> 3.7 -> 4.13->5.12. BUN 102.  Oliguric, only 290 cc in UOP charted yesterday. No uremic symptoms currently.   CVP 5   On abx for PNA. Sputum culture + for stenotrophamonas and serratia. AF. WBC trending back up, 11>>12.   Platelets continue to trend down, now at 46K. Hgb 7.7.     Objective:   Weight Range: 77.2 kg Body mass index is 27.45 kg/m.   Vital Signs:   Temp:  [97.6 F (36.4 C)-98.5 F (36.9 C)] 97.8 F (36.6 C) (07/01 0739) Pulse Rate:  [95-119] 119 (07/01 0500) Resp:  [13-33] 25 (07/01 0500) BP: (96-128)/(65-86) 114/74 (07/01 0400) SpO2:  [90 %-100 %] 95 % (07/01 0500) FiO2 (%):  [50 %] 50 % (07/01 0000) Weight:  [77.2 kg] 77.2 kg (07/01 0500) Last BM Date: 12/07/19  Weight change: Filed Weights   12/07/19 0500 12/08/19 0652 12/09/19 0500  Weight: 77.7 kg 78.1 kg 77.2 kg    Intake/Output:   Intake/Output Summary (Last 24 hours) at 12/09/2019 0751 Last data filed at 12/09/2019 0500 Gross per 24 hour  Intake 2039.27 ml  Output 290 ml  Net 1749.27 ml      Physical Exam   CVP 5   General:  Well appearing, sitting up in chair. No respiratory difficulty HEENT: normal Neck: supple. no JVD. Carotids 2+ bilat; no bruits. No lymphadenopathy or thyromegaly appreciated. Cor: PMI nondisplaced. Irregular rhythm, regular rate. No rubs, gallops or murmurs. Lungs: clear Abdomen: soft, nontender, nondistended. No hepatosplenomegaly. No bruits or masses. Good bowel sounds. Extremities: no cyanosis, clubbing, rash, trace bilateral LEE, bilateral compression wraps  Neuro: alert & oriented x 3, cranial nerves grossly intact. moves all 4 extremities w/o  difficulty. Affect pleasant.  Telemetry    Paced  90s    Labs    CBC Recent Labs    12/08/19 0533 12/08/19 0533 12/08/19 1330 12/09/19 0445  WBC 11.0*  --   --  12.1*  NEUTROABS 10.4*  --   --  11.4*  HGB 7.5*   < > 7.9* 7.7*  HCT 23.0*   < > 24.4* 23.4*  MCV 90.6  --   --  88.6  PLT 48*  --   --  46*   < > = values in this interval not displayed.   Basic Metabolic Panel Recent Labs    12/07/19 0500 12/07/19 0500 12/08/19 0533 12/09/19 0445  NA 135   < > 133* 131*  K 3.6   < > 4.3 4.3  CL 94*   < > 93* 93*  CO2 26   < > 29 26  GLUCOSE 107*   < > 104* 106*  BUN 84*   < > 90* 102*  CREATININE 3.67*   < > 4.13* 5.12*  CALCIUM 8.2*   < > 8.1* 8.0*  MG 2.2  --  2.5*  --   PHOS 4.3  --  4.1  --    < > = values in this interval not displayed.   Liver Function Tests No results for input(s): AST, ALT, ALKPHOS, BILITOT, PROT, ALBUMIN in the last 72 hours. No results for input(s): LIPASE,  AMYLASE in the last 72 hours. Cardiac Enzymes No results for input(s): CKTOTAL, CKMB, CKMBINDEX, TROPONINI in the last 72 hours.  BNP: BNP (last 3 results) Recent Labs    11/27/2019 2002 11/29/19 1513  BNP 3,282.1* 2,503.4*    ProBNP (last 3 results) No results for input(s): PROBNP in the last 8760 hours.   D-Dimer No results for input(s): DDIMER in the last 72 hours. Hemoglobin A1C No results for input(s): HGBA1C in the last 72 hours. Fasting Lipid Panel No results for input(s): CHOL, HDL, LDLCALC, TRIG, CHOLHDL, LDLDIRECT in the last 72 hours. Thyroid Function Tests No results for input(s): TSH, T4TOTAL, T3FREE, THYROIDAB in the last 72 hours.  Invalid input(s): FREET3  Other results:   Imaging    No results found.   Medications:     Scheduled Medications: . sodium chloride   Intravenous Once  . atorvastatin  80 mg Oral q1800  . Chlorhexidine Gluconate Cloth  6 each Topical Daily  . Chlorhexidine Gluconate Cloth  6 each Topical Q0600  . clopidogrel  75 mg  Oral Daily  . DULoxetine  60 mg Oral Daily  . feeding supplement (ENSURE ENLIVE)  237 mL Oral BID BM  . guaiFENesin  600 mg Oral BID  . insulin aspart  0-9 Units Subcutaneous TID WC  . mouth rinse  15 mL Mouth Rinse BID  . mometasone-formoterol  2 puff Inhalation BID  . multivitamin with minerals  1 tablet Oral Daily  . nicotine  21 mg Transdermal Daily  . pantoprazole  40 mg Oral Daily  . sodium chloride flush  10-40 mL Intracatheter Q12H  . sodium chloride flush  3 mL Intravenous Q12H  . tamsulosin  0.4 mg Oral QPC breakfast    Infusions: . sodium chloride    . amiodarone 30 mg/hr (12/08/19 2036)  . levofloxacin (LEVAQUIN) IV Stopped (12/08/19 1013)  . milrinone 0.125 mcg/kg/min (12/08/19 2035)    PRN Medications: sodium chloride, acetaminophen **OR** acetaminophen, ipratropium, levalbuterol, melatonin, sodium chloride flush, sodium chloride flush   Assessment/Plan   1. Acute on chronic systolic CHF with prominent RV dysfunction: Has MDT CRT-P device.  Most recent echo with EF 40-45% with severely dilated RV with moderate systolic dysfunction.  RV function looks worse than pre-op but was not normal previously.  Hs-TnI not significantly elevated so doubt RV infarct/closure of RCA stent.  Suspect RV dysfunction is due to history of severe COPD.   - On milrinone 0.125 mcg/kg/min for RV support.  - CO-OX 79%.  - CVP 5.  - Creatinine continues to trend up now 5.12. BUN 102. Oliguric. Diuretics on hold. Will consult nephrology  2. AKI on CKD stage 3: Baseline creatinine 2.  - Creatinine continues to rise 3.33 -> 3.37->3.25->3.67-> 4.13->5.12. BUN 102. Oliguric. K 4.3. no current uremic symptoms.   -Suspect multifactorial => cardiorenal, contrast, hypotension/ATN.   - Continue to hold diuretics - Consult nephrology  - Support RV with milrinone.  3. CAD: s/p PCI to RCA in 2/21. As above, HS-TnI not significantly elevated, no evidence for RV infarct.  - Denies s/s of angina -  Continue Plavix, statin.  4. Atrial fibrillation, paroxysmal: A-paced on admit, now in underlying afib with BiV pacing.  -  Continue IV amio while on milrinone  - Currently holding anticoagulation with anemia/concern for bleeding.  5. Anemia, acute blood loss: required transfusion.  - Hgb 7.7 today. Anemia panel c/w IDA. - No source of bleeding found yet.  No RP hematoma on CT.    -  Check FOBT  - Give Feraheme  6. Thrombocytopenia: Plts continue to trend down, 46 K today. Suspect due to critical illness.  No bleeding  7. Severe AS: S/p TAVR. Bioprosthetic aortic valve stable on post-op echo.  8. COPD with acute on chronic respiratory failure: Severe, recently quit smoking.   - Prednisone begun 6/26 by CCM.  - continue breathing treatments  9. ID: Concern for HCAP. Bilateral airspace disease on CXR.  - Sputum culture + for stenotrophamonas and serratia. AF - PCT 0.85  - PCCM managing abx. Per PCCM, recommend continuing levofloxacin regardless of steno ceftazidime susceptibility. .   Length of Stay: [address] PA-C  12/09/2019, 7:51 AM  Advanced Heart Failure Team Pager [phone] (M-F; 7a - 4p)  Please contact [facility] for night-coverage after hours (4p -7a ) and weekends on [email or web address]  Agree with above.   He looks and feels better today. Denies SOB or wheezing. Afebrile. Co-ox 79% on milrinone 0.129.  Edema much improved. Lasix stopped yesterday. Creatinine continues to rise.   HGB still drifting down after 1u RBCs yesterday. Getting Westley Chandler now. Remains in AF/AFL with v-pacing. CVP 5 (checked personally). PLTs 48K  General:  Sitting up in chair . No resp difficulty HEENT: normal Neck: supple. no JVD. Carotids 2+ bilat; no bruits. No lymphadenopathy or thryomegaly appreciated. Cor: PMI nondisplaced. irregular rate & rhythm. No rubs, gallops or murmurs. Lungs: clear Abdomen: soft, nontender, nondistended. No hepatosplenomegaly. No bruits or masses. Good bowel  sounds. R flank and groin ecchymosis Extremities: no cyanosis, clubbing, rash, tr-1+ edema + compression hose Neuro: alert & orientedx3, cranial nerves grossly intact. moves all 4 extremities w/o difficulty. Affect pleasant  Hemodynamically stable but now with worsening renal function. No uremia on exam. HGb still drifting down.   Continue to hold lasix. Have consulted Renal for AKI. Mr. Ressel is willing to accept short-term CVVHD if needed but would not want long-term HD. Give feraheme. Likely would benefit from Aranesp as well. Can stop milrinone with co-ox 79%. Keep MAP > 70.   Unable to [facility] for AF at this point.   CRITICAL CARE Performed by: Glori Bickers  Total critical care time: 35 minutes  Critical care time was exclusive of separately billable procedures and treating other patients.  Critical care was necessary to treat or prevent imminent or life-threatening deterioration.  Critical care was time spent personally by me (independent of midlevel providers or residents) on the following activities: development of treatment plan with patient and/or surrogate as well as nursing, discussions with consultants, evaluation of patient's response to treatment, examination of patient, obtaining history from patient or surrogate, ordering and performing treatments and interventions, ordering and review of laboratory studies, ordering and review of radiographic studies, pulse oximetry and re-evaluation of patient's condition.  Glori Bickers, MD  8:39 AM

## 2019-12-09 NOTE — Progress Notes (Signed)
Hypoglycemic Event  CBG: 63  Treatment: PO orange juice   Symptoms: N/A  Follow-up CBG: Time:1150 CBG Result:91  Possible Reasons for Event: Pt slept through lunch   Comments/MD notified:no    Justin Boone

## 2019-12-09 NOTE — Progress Notes (Signed)
NAME:  Justin Boone, MRN:  [id number], DOB:  1950/08/02, LOS: 42 ADMISSION DATE:  11/09/2019, CONSULTATION DATE:  12/02/2010 REFERRING MD:  Dr. Sallyanne Kuster , CHIEF COMPLAINT:  Hypotension and hypoxia  Brief History   Patient is a 69 year old male who transferred from outside facility for consultation of cardiology and nephrology.  Underwent TAVR due to severe aortic stenosis on 6/22.  1 days following TAVR patient seen with presyncopal episodes.  On 6/24 patient again seen with presyncopal episode and development of hypoxia with persistent hypotension necessitating transfer to ICU and consult to PCCM  Past Medical History  Chronic atrial fibrillation anticoagulated on Coumadin Bradycardia status post pacemaker placement Systolic congestive heart failure Type 2 diabetes Hypertension Severe aortic stenosis now status post TAVR Chronic kidney disease stage III Gout COPD  Daily tobacco abuse Obstructive sleep apnea with nocturnal CPAP, reports compliance Hyperlipidemia CAD status post PCI  Significant Hospital Events   6/12 Admitted from outside facility 6/27:Had episode of desaturations requiring BIPAP 6/30 On HHF and Bipap at night. O2 requirements slowly improving. Lasix drip stopped 7/1 Renal consulted for worsening AKI  Consults:  Cardiology Nephrology PCCM Cardiothoracic surgery Dental medicine  Procedures:  Dental extraction of teeth 11, 13, and 14 6/16 TAVR 6/21  Significant Diagnostic Tests:  6/23 echo: LVEF 40-45% RV mildly reduced, RVSP 44  6/24 ct abd/pelvis: 1. No acute intra-abdominal or pelvic pathology. No intraperitoneal or retroperitoneal fluid collection or hematoma. 2. Partially visualized bilateral pleural effusions with findings concerning for multifocal pneumonia. Clinical correlation and follow-up to resolution recommended. 3. Diffuse subcutaneous edema and anasarca. 4. Aortic Atherosclerosis (ICD10-I70.0).  Micro Data:  Covid 6/12 >  negative Surgical PCR 6/16 > negative MRSA PCR screening 6/21 > negative Sputum 6/26>> steno, serratia  Antimicrobials:  Cefuroxime 6/22->6/24 cefepime 6/26-> vanc 6/26 levaquin 6/28->  Interim history/subjective:     Objective   Blood pressure 114/74, pulse (!) 119, temperature 97.8 F (36.6 C), temperature source Oral, resp. rate (!) 25, height 5\' 6"  (1.676 m), weight 77.2 kg, SpO2 95 %. CVP:  [8 mmHg-11 mmHg] 8 mmHg  FiO2 (%):  [50 %] 50 %   Intake/Output Summary (Last 24 hours) at 12/09/2019 0829 Last data filed at 12/09/2019 0800 Gross per 24 hour  Intake 1598.84 ml  Output 290 ml  Net 1308.84 ml   Filed Weights   12/07/19 0500 12/08/19 0652 12/09/19 0500  Weight: 77.7 kg 78.1 kg 77.2 kg    Examination: Gen:      No acute distress HEENT:  EOMI, sclera anicteric Neck:     No masses; no thyromegaly Lungs:    Clear to auscultation bilaterally; normal respiratory effort CV:         Regular rate and rhythm; no murmurs Abd:      + bowel sounds; soft, non-tender; no palpable masses, no distension Ext:    No edema; adequate peripheral perfusion Skin:      Warm and dry; no rash Neuro: alert and oriented x 3 Psych: normal mood and affect  Labs reviewed significant for sodium 131, BUN/creatinine 102/5.12, WBC 12.1 No new imaging  Resolved Hospital Problem list     Assessment & Plan:  COPD, respiratory failure Serratia and stenotrophomonas pneumonia Chest x-ray with bilateral infiltrates Wean down oxygen as tolerated, BiPAP as needed Follow chest x-ray Levaquin for 7 days Incentive spirometer, out of bed, mobilize  Acute on chronic systolic heart failure, LV dysfunction Severe AS s/p TAVR Inotropes and diuretics per cardiology Follow ScVo2  Acute on  chronic kidney injury: Cr worsening today to 5.12 Renal consulted by primary team  Prolonged qt:  Monitor telemetry  Thrombocytopenia Coagulopathy with elevated  INR- last coumadin 6/23 Follow labs  Severe  muscular deconditioning- PT and progressive mobility  Anemia, flank bruising- monitor clinically, no significant hematoma on CT  Best practice:  Diet: Cardiac Pain/Anxiety/Delirium protocol (if indicated): As needed VAP protocol (if indicated): Not applicable DVT prophylaxis: SCDs GI prophylaxis: PPI Glucose control: SSI Mobility: Bedrest Code Status: Full code Family Communication: Per primary Disposition: ICU pending inotrope wean and consistent improvement in resp status  Critical care time:    The patient is critically ill with multiple organ system failure and requires high complexity decision making for assessment and support, frequent evaluation and titration of therapies, advanced monitoring, review of radiographic studies and interpretation of complex data.   Critical Care Time devoted to patient care services, exclusive of separately billable procedures, described in this note is 35 minutes.   Marshell Garfinkel MD [facility] Please see [email or web address] for pager details.  12/09/2019, 8:29 AM

## 2019-12-09 NOTE — Progress Notes (Signed)
Physical Therapy Treatment Patient Details Name: Justin Boone MRN: [id number] DOB: Nov 21, 1950 Today's Date: 12/09/2019    History of Present Illness Pt is a 69 year old male with history of A. fib on Coumadin, CAD, bradycardia s/p  [employer] pacer, DM 2, HTN, severe AS, COPD, CKD stage IIIb is admitted to [facility] as a hospital to hospital transfer from [facility] for cardiology evaluation. s/p right-sided thoracentesis for symptomatic right-sided pleural effusion at OSH. 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    PT Comments    Pt pleasant on 10L HFNC throughout session and able to increase gait to 74' with chair follow. Pt with noted buckling on 2nd gait trial and needs +2 support of lines and chair with safety to prevent fall. Pt educated for HEP and continued mobility with pt reporting he feels he is slowly improving with mobility.   HR 115 101/66 (78) 90-100% on 10L    Follow Up Recommendations  Home health PT;Supervision/Assistance - 24 hour     Equipment Recommendations  Rolling walker with 5" wheels    Recommendations for Other Services       Precautions / Restrictions Precautions Precautions: Fall Precaution Comments: watch 02, knee buckling    Mobility  Bed Mobility               General bed mobility comments: in chair on arrival and end of session  Transfers Overall transfer level: Needs assistance   Transfers: Sit to/from Stand Sit to Stand: Min guard         General transfer comment: cues for hand placement with guarding for stability and assist for lines  Ambulation/Gait Ambulation/Gait assistance: Min assist;+2 safety/equipment Gait Distance (Feet): 60 Feet Assistive device: Rolling walker (2 wheeled) Gait Pattern/deviations: Step-through pattern;Decreased stride length;Narrow base of support;Trunk flexed   Gait velocity interpretation: 1.31 - 2.62 ft/sec, indicative of limited community ambulator General Gait  Details: pt able to walk 60' well with good stability and able to sit when fatigued. On 2nd trial pt with 2 episodes of partial knee buckling with pt able to recover with bil UE support on RW and assist of therapist with close chair follow and gait of 44' on 2nd trial   Stairs             Wheelchair Mobility    Modified Rankin (Stroke Patients Only)       Balance Overall balance assessment: Needs assistance   Sitting balance-Leahy Scale: Fair     Standing balance support: During functional activity;Bilateral upper extremity supported Standing balance-Leahy Scale: Poor Standing balance comment: RW for support and safety                            Cognition Arousal/Alertness: Awake/alert Behavior During Therapy: WFL for tasks assessed/performed Overall Cognitive Status: Within Functional Limits for tasks assessed                                        Exercises General Exercises - Lower Extremity Long Arc Quad: AROM;Both;Seated;20 reps Hip ABduction/ADduction: AROM;Both;10 reps;Seated Hip Flexion/Marching: AROM;Both;Seated;20 reps    General Comments        Pertinent Vitals/Pain Pain Assessment: No/denies pain    Home Living                      Prior  Function            PT Goals (current goals can now be found in the care plan section) Progress towards PT goals: Progressing toward goals    Frequency    Min 3X/week      PT Plan Current plan remains appropriate    Co-evaluation              AM-PAC PT "6 Clicks" Mobility   Outcome Measure  Help needed turning from your back to your side while in a flat bed without using bedrails?: A Little Help needed moving from lying on your back to sitting on the side of a flat bed without using bedrails?: A Little Help needed moving to and from a bed to a chair (including a wheelchair)?: A Little Help needed standing up from a chair using your arms (e.g., wheelchair  or bedside chair)?: A Little Help needed to walk in hospital room?: A Lot Help needed climbing 3-5 steps with a railing? : A Lot 6 Click Score: 16    End of Session Equipment Utilized During Treatment: Gait belt Activity Tolerance: Patient tolerated treatment well Patient left: in chair;with call bell/phone within reach Nurse Communication: Mobility status PT Visit Diagnosis: Muscle weakness (generalized) (M62.81);Other abnormalities of gait and mobility (R26.89)     Time: 6468-0321 PT Time Calculation (min) (ACUTE ONLY): 24 min  Charges:  $Gait Training: 8-22 mins $Therapeutic Exercise: 8-22 mins                     Arek Spadafore P, PT Acute Rehabilitation Services Pager: [phone] Office: [place] 12/09/2019, 1:49 PM

## 2019-12-09 NOTE — Consult Note (Signed)
[facility] Renal Consultation Note  Requesting MD:  Indication for Consultation: Acute kidney injury, chronic kidney disease stage III/IV, maintenance of euvolemia assessment of anemia, assessment and treatment of electrolyte abnormality.   HPI:   Justin Boone is a 69 y.o. male.  Admitted 6/7 /2021 to [facility] and transferred to [facility] 11/21/2019 history significant for atrial fibrillation, bradycardia status post Medtronic CRT-P, congestive heart failure with systolic dysfunction, hypertension and severe aortic stenosis.  History of coronary artery disease status post PCI history of tobacco abuse hyperlipidemia, gout, COPD and chronic kidney disease stage III.  As from epic records it seems that serum creatinine is between 2 and 2.5 mg/dL at baseline.  CT scan without contrast 11/15/2019 showed bilateral pleural effusions Thoracentesis 11/16/2019 1.1 L removed Echo 11/18/2019 showed EF 50-55% with moderate AAS and severe MR.   Creatinine has worsened during hospitalization.  Appears to have nonoliguric renal failure with adequate urine output.  Urine output can be followed through epic records  Admission weight 82.8 kg Current weight 77.2 kg  Positive fluid balance last 24 hours of 1.6 L  Urinalysis was negative for cellular elements positive for 100 mg/dL protein   Underwent TAVR for severe aortic stenosis 11/18/2019  Patient has some hemodynamic instability since admission.  Medications atorvastatin 80 mg daily, Plavix 75 mg daily Cymbalta 60 mg daily Protonix 40 mg daily Flomax 0.4 mg daily.  IV amiodarone 30 mg/h IV Feraheme 510 mg IV administered 12/09/2019 IV milrinone  IV Lasix administered 10 mg/h 12/03/2019-12/08/2019 IV vancomycin administered 12/04/2019  Blood pressure 113/103 pulse 96 temperature 98 O2 sats 83% on 8 L CVP 4 Urine output 400 cc 12/08/2019      Creatinine, Ser  Date/Time Value Ref Range Status  12/09/2019  04:45 AM 5.12 (H) 0.61 - 1.24 mg/dL Final  12/08/2019 05:33 AM 4.13 (H) 0.61 - 1.24 mg/dL Final  12/07/2019 05:00 AM 3.67 (H) 0.61 - 1.24 mg/dL Final  12/06/2019 03:15 AM 3.25 (H) 0.61 - 1.24 mg/dL Final  12/05/2019 03:25 AM 3.37 (H) 0.61 - 1.24 mg/dL Final  12/04/2019 03:28 AM 3.33 (H) 0.61 - 1.24 mg/dL Final  12/03/2019 02:00 PM 3.31 (H) 0.61 - 1.24 mg/dL Final  12/03/2019 02:58 AM 3.34 (H) 0.61 - 1.24 mg/dL Final  12/02/2019 03:47 AM 3.05 (H) 0.61 - 1.24 mg/dL Final  12/01/2019 02:33 AM 2.43 (H) 0.61 - 1.24 mg/dL Final  11/13/2019 06:09 PM 2.10 (H) 0.61 - 1.24 mg/dL Final  12/01/2019 04:37 PM 2.10 (H) 0.61 - 1.24 mg/dL Final  11/13/2019 03:39 PM 2.20 (H) 0.61 - 1.24 mg/dL Final  11/19/2019 05:11 AM 2.10 (H) 0.61 - 1.24 mg/dL Final  11/29/2019 08:42 AM 2.09 (H) 0.61 - 1.24 mg/dL Final  11/25/2019 03:20 AM 1.92 (H) 0.61 - 1.24 mg/dL Final  11/10/2019 06:26 AM 1.93 (H) 0.61 - 1.24 mg/dL Final  11/22/2019 07:05 AM 2.15 (H) 0.61 - 1.24 mg/dL Final  11/21/2019 09:03 AM 2.00 (H) 0.61 - 1.24 mg/dL Final  11/27/2019 08:02 PM 2.19 (H) 0.61 - 1.24 mg/dL Final  09/28/2019 05:22 AM 2.33 (H) 0.61 - 1.24 mg/dL Final  09/27/2019 05:43 AM 2.48 (H) 0.61 - 1.24 mg/dL Final  09/26/2019 04:11 AM 2.08 (H) 0.61 - 1.24 mg/dL Final  09/25/2019 04:51 AM 2.27 (H) 0.61 - 1.24 mg/dL Final  09/24/2019 04:21 AM 2.05 (H) 0.61 - 1.24 mg/dL Final  09/23/2019 06:01 AM 2.04 (H) 0.61 - 1.24 mg/dL Final  09/22/2019 05:02 AM 1.96 (H) 0.61 - 1.24 mg/dL  Final  09/21/2019 06:46 AM 2.01 (H) 0.61 - 1.24 mg/dL Final  09/20/2019 05:33 AM 2.33 (H) 0.61 - 1.24 mg/dL Final  09/19/2019 12:23 PM 2.37 (H) 0.61 - 1.24 mg/dL Final  09/18/2019 05:50 AM 2.77 (H) 0.61 - 1.24 mg/dL Final  09/17/2019 08:21 AM 2.89 (H) 0.61 - 1.24 mg/dL Final  09/16/2019 06:20 PM 3.00 (H) 0.61 - 1.24 mg/dL Final  09/16/2019 06:17 PM 2.87 (H) 0.61 - 1.24 mg/dL Final     PMHx:   Past Medical History:  Diagnosis Date  . A-fib ([place])   . Bradycardia  09/17/2019  . CHF (congestive heart failure) ([place])   . Diabetes mellitus type II, controlled ([place])   . HTN (hypertension)   . 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 BIV PACEMAKER INSERTION CRT-P N/A 09/22/2019   Procedure: BIV PACEMAKER INSERTION CRT-P;  Surgeon: Deboraha Sprang, MD;  Location: [facility];  Service: Cardiovascular;  Laterality: N/A;  . CHOLECYSTECTOMY    . MULTIPLE EXTRACTIONS WITH ALVEOLOPLASTY Bilateral 11/14/2019   Procedure: Extraction of tooth #'s 11,13, and 14 with alveoloplasty and gross debridement of remaining dentition;  Surgeon: Lenn Cal, DDS;  Location: [facility];  Service: Oral Surgery;  Laterality: Bilateral;  . SHOULDER SURGERY    . TEE WITHOUT CARDIOVERSION N/A 11/25/2019   Procedure: TRANSESOPHAGEAL ECHOCARDIOGRAM (TEE);  Surgeon: Sherren Mocha, MD;  Location: [facility];  Service: Open Heart Surgery;  Laterality: N/A;  . TRANSCATHETER AORTIC VALVE REPLACEMENT, TRANSFEMORAL N/A 11/11/2019   Procedure: TRANSCATHETER AORTIC VALVE REPLACEMENT, TRANSFEMORAL;  Surgeon: Sherren Mocha, MD;  Location: [facility];  Service: Open Heart Surgery;  Laterality: N/A;    Family Hx:  Family History  Problem Relation Age of Onset  . Heart disease Mother   . Heart disease Father   . Stroke Father     Social History:  reports that he has been smoking cigarettes. He has a 25.00 pack-year smoking history. He has never used smokeless tobacco. He reports that he does not drink alcohol and does not use drugs.  Allergies:  Allergies  Allergen Reactions  . Contrast Media [Iodinated Diagnostic Agents]   . Sulfa Antibiotics     Medications: Prior to Admission medications   Medication Sig Start Date End Date Taking? Authorizing Provider  albuterol (PROVENTIL) (2.5  MG/3ML) 0.083% nebulizer solution Inhale 3 mLs into the lungs every 6 (six) hours as needed for shortness of breath or wheezing. 08/27/19  Yes [provider]  albuterol (VENTOLIN HFA) 108 (90 Base) MCG/ACT inhaler Inhale 2 puffs into the lungs every 6 (six) hours as needed for wheezing or shortness of breath.   Yes [provider]  allopurinol (ZYLOPRIM) 300 MG tablet Take 300 mg by mouth daily. 08/20/19  Yes [provider]  amiodarone (PACERONE) 400 MG tablet Take 1 tablet (431m) by mouth twice daily x 2 weeks Then decrease to 1 tablet (4094m daily x 4 weeks Patient taking differently: Take 400 mg by mouth daily.  10/06/19  Yes KlDeboraha SprangMD  atorvastatin (LIPITOR) 80 MG tablet Take 80 mg by mouth daily. 08/20/19  Yes [provider]  clopidogrel (PLAVIX) 75 MG tablet Take 75 mg by mouth daily. 08/12/19  Yes [provider]  DULoxetine (CYMBALTA) 60 MG capsule Take 60 mg by mouth daily. 07/30/19  Yes [provider]  furosemide (LASIX) 80 MG tablet Take 1 tablet (80 mg total) by mouth 2 (two) times daily. 09/28/19  Yes Shelly Coss, MD  metoprolol succinate (TOPROL-XL) 50 MG 24 hr tablet Take 50 mg by mouth daily. 08/20/19  Yes [provider]  pantoprazole (PROTONIX) 40 MG tablet Take 40 mg by mouth daily.   Yes [provider]  tamsulosin (FLOMAX) 0.4 MG CAPS capsule Take 0.4 mg by mouth daily. 07/30/19  Yes [provider]  TRELEGY ELLIPTA 100-62.5-25 MCG/INH AEPB Inhale 1 puff into the lungs daily. 08/27/19  Yes [provider]  warfarin (COUMADIN) 2 MG tablet Take 2 mg by mouth See admin instructions. Tuesday, Thursday, Saturday, and Sunday evenings   Yes [provider]  warfarin (COUMADIN) 3 MG tablet Take 1 tablet (3 mg total) by mouth daily. Please cancel the earlier prescription for aspirin Patient taking differently: Take 3 mg by mouth every Monday, Wednesday, and Friday.  09/28/19 11/27/19  Yes Shelly Coss, MD      Labs:  Results for orders placed or performed during the hospital encounter of 11/27/2019 (from the past 48 hour(s))  Glucose, capillary     Status: Abnormal   Collection Time: 12/07/19  3:22 PM  Result Value Ref Range   Glucose-Capillary 124 (H) 70 - 99 mg/dL    Comment: Glucose reference range applies only to samples taken after fasting for at least 8 hours.  Glucose, capillary     Status: Abnormal   Collection Time: 12/07/19  9:29 PM  Result Value Ref Range   Glucose-Capillary 141 (H) 70 - 99 mg/dL    Comment: Glucose reference range applies only to samples taken after fasting for at least 8 hours.  Protime-INR     Status: Abnormal   Collection Time: 12/08/19  5:33 AM  Result Value Ref Range   Prothrombin Time 17.5 (H) 11.4 - 15.2 seconds   INR 1.5 (H) 0.8 - 1.2    Comment: (NOTE) INR goal varies based on device and disease states. Performed at [facility], [place] [address], [place], [place] [address]   Magnesium     Status: Abnormal   Collection Time: 12/08/19  5:33 AM  Result Value Ref Range   Magnesium 2.5 (H) 1.7 - 2.4 mg/dL    Comment: Performed at [facility] [address], [place], [place] [address]  Phosphorus     Status: None   Collection Time: 12/08/19  5:33 AM  Result Value Ref Range   Phosphorus 4.1 2.5 - 4.6 mg/dL    Comment: Performed at [facility] [address], [place], [place] [address]  Basic metabolic panel     Status: Abnormal   Collection Time: 12/08/19  5:33 AM  Result Value Ref Range   Sodium 133 (L) 135 - 145 mmol/L   Potassium 4.3 3.5 - 5.1 mmol/L   Chloride 93 (L) 98 - 111 mmol/L   CO2 29 22 - 32 mmol/L   Glucose, Bld 104 (H) 70 - 99 mg/dL    Comment: Glucose reference range applies only to samples taken after fasting for at least 8 hours.   BUN 90 (H) 8 - 23 mg/dL   Creatinine, Ser 4.13 (H) 0.61 - 1.24 mg/dL   Calcium 8.1 (L) 8.9 - 10.3 mg/dL   GFR calc non Af Amer 14 (L) >60 mL/min   GFR  calc Af Amer 16 (L) >60 mL/min   Anion gap 11 5 -  15    Comment: Performed at [facility], [place] [address] [place], [place] [address]  CBC with Differential/Platelet     Status: Abnormal   Collection Time: 12/08/19  5:33 AM  Result Value Ref Range   WBC 11.0 (H) 4.0 - 10.5 K/uL   RBC 2.54 (L) 4.22 - 5.81 MIL/uL   Hemoglobin 7.5 (L) 13.0 - 17.0 g/dL   HCT 23.0 (L) 39 - 52 %   MCV 90.6 80.0 - 100.0 fL   MCH 29.5 26.0 - 34.0 pg   MCHC 32.6 30.0 - 36.0 g/dL   RDW 16.9 (H) 11.5 - 15.5 %   Platelets 48 (L) 150 - 400 K/uL    Comment: CONSISTENT WITH PREVIOUS RESULT Immature Platelet Fraction may be clinically indicated, consider ordering this additional test [id number] REPEATED TO VERIFY    nRBC 0.3 (H) 0.0 - 0.2 %   Neutrophils Relative % 94 %   Neutro Abs 10.4 (H) 1.7 - 7.7 K/uL   Lymphocytes Relative 3 %   Lymphs Abs 0.3 (L) 0.7 - 4.0 K/uL   Monocytes Relative 2 %   Monocytes Absolute 0.3 0 - 1 K/uL   Eosinophils Relative 0 %   Eosinophils Absolute 0.0 0 - 0 K/uL   Basophils Relative 0 %   Basophils Absolute 0.0 0 - 0 K/uL   Immature Granulocytes 1 %   Abs Immature Granulocytes 0.07 0.00 - 0.07 K/uL    Comment: Performed at [facility], [place] [address] [place], [place] [address]  .Cooxemetry Panel (carboxy, met, total hgb, O2 sat)     Status: Abnormal   Collection Time: 12/08/19  5:49 AM  Result Value Ref Range   Total hemoglobin 10.8 (L) 12.0 - 16.0 g/dL   O2 Saturation 72.5 %   Carboxyhemoglobin 1.5 0.5 - 1.5 %   Methemoglobin 0.8 0.0 - 1.5 %    Comment: Performed at [facility] [address], [place], [place] [address]  Glucose, capillary     Status: Abnormal   Collection Time: 12/08/19  6:17 AM  Result Value Ref Range   Glucose-Capillary 103 (H) 70 - 99 mg/dL    Comment: Glucose reference range applies only to samples taken after fasting for at least 8 hours.  Type and screen [place]     Status: None   Collection Time:  12/08/19  8:03 AM  Result Value Ref Range   ABO/RH(D) O POS    Antibody Screen NEG    Sample Expiration 12/11/2019,2359    Unit Number [id number]    Blood Component Type RBC LR PHER1    Unit division 00    Status of Unit ISSUED,FINAL    Transfusion Status OK TO TRANSFUSE    Crossmatch Result      Compatible Performed at [facility], [place] [address], [place], [place] [address]   Prepare RBC (crossmatch)     Status: None   Collection Time: 12/08/19  8:03 AM  Result Value Ref Range   Order Confirmation      ORDER PROCESSED BY BLOOD BANK Performed at [facility], [place] [address], [place], [place] [address]   Reticulocytes     Status: Abnormal   Collection Time: 12/08/19  8:35 AM  Result Value Ref Range   Retic Ct Pct 1.5 0.4 - 3.1 %   RBC. 2.53 (L) 4.22 - 5.81 MIL/uL   Retic Count, Absolute 36.7 19.0 - 186.0 K/uL  Immature Retic Fract 17.7 (H) 2.3 - 15.9 %    Comment: Performed at [facility], [place] [address], [place], [place] [address]  Vitamin B12     Status: None   Collection Time: 12/08/19  8:48 AM  Result Value Ref Range   Vitamin B-12 372 180 - 914 pg/mL    Comment: (NOTE) This assay is not validated for testing neonatal or myeloproliferative syndrome specimens for Vitamin B12 levels. Performed at [facility], [place] [address], [place], [place] [address]   Iron and TIBC     Status: Abnormal   Collection Time: 12/08/19  8:48 AM  Result Value Ref Range   Iron 8 (L) 45 - 182 ug/dL   TIBC 127 (L) 250 - 450 ug/dL   Saturation Ratios 6 (L) 17.9 - 39.5 %   UIBC 119 ug/dL    Comment: Performed at [facility], [place] [address] [place], [place] [address]  Ferritin     Status: None   Collection Time: 12/08/19  8:48 AM  Result Value Ref Range   Ferritin 321 24 - 336 ng/mL    Comment: Performed at [facility] [address], [place], [place] [address]  Folate     Status: None   Collection Time: 12/08/19  8:48 AM  Result Value Ref  Range   Folate 7.3 >5.9 ng/mL    Comment: Performed at [facility] [address] [place], [place] [address]  Glucose, capillary     Status: Abnormal   Collection Time: 12/08/19 11:19 AM  Result Value Ref Range   Glucose-Capillary 118 (H) 70 - 99 mg/dL    Comment: Glucose reference range applies only to samples taken after fasting for at least 8 hours.  Hemoglobin and hematocrit, blood     Status: Abnormal   Collection Time: 12/08/19  1:30 PM  Result Value Ref Range   Hemoglobin 7.9 (L) 13.0 - 17.0 g/dL   HCT 24.4 (L) 39 - 52 %    Comment: Performed at [facility] [address] [place],  [address]  Glucose, capillary     Status: Abnormal   Collection Time: 12/08/19  3:24 PM  Result Value Ref Range   Glucose-Capillary 130 (H) 70 - 99 mg/dL    Comment: Glucose reference range applies only to samples taken after fasting for at least 8 hours.  Glucose, capillary     Status: Abnormal   Collection Time: 12/08/19  9:36 PM  Result Value Ref Range   Glucose-Capillary 211 (H) 70 - 99 mg/dL    Comment: Glucose reference range applies only to samples taken after fasting for at least 8 hours.  Protime-INR     Status: Abnormal   Collection Time: 12/09/19  4:45 AM  Result Value Ref Range   Prothrombin Time 16.8 (H) 11.4 - 15.2 seconds   INR 1.4 (H) 0.8 - 1.2    Comment: (NOTE) INR goal varies based on device and disease states. Performed at [facility], [place] [address] [place],  [address]   CBC with Differential/Platelet     Status: Abnormal   Collection Time: 12/09/19  4:45 AM  Result Value Ref Range   WBC 12.1 (H) 4.0 - 10.5 K/uL   RBC 2.64 (L) 4.22 - 5.81 MIL/uL   Hemoglobin 7.7 (L) 13.0 - 17.0 g/dL   HCT 23.4 (L) 39 - 52 %   MCV 88.6 80.0 - 100.0 fL  MCH 29.2 26.0 - 34.0 pg   MCHC 32.9 30.0 - 36.0 g/dL   RDW 16.4 (H) 11.5 - 15.5 %   Platelets 46 (L) 150 - 400 K/uL    Comment: CONSISTENT WITH PREVIOUS RESULT Immature Platelet Fraction may  be clinically indicated, consider ordering this additional test [id number] REPEATED TO VERIFY    nRBC 0.2 0.0 - 0.2 %   Neutrophils Relative % 93 %   Neutro Abs 11.4 (H) 1.7 - 7.7 K/uL   Lymphocytes Relative 3 %   Lymphs Abs 0.3 (L) 0.7 - 4.0 K/uL   Monocytes Relative 3 %   Monocytes Absolute 0.3 0 - 1 K/uL   Eosinophils Relative 0 %   Eosinophils Absolute 0.0 0 - 0 K/uL   Basophils Relative 0 %   Basophils Absolute 0.0 0 - 0 K/uL   Immature Granulocytes 1 %   Abs Immature Granulocytes 0.09 (H) 0.00 - 0.07 K/uL    Comment: Performed at [facility], [address] [place], [place] [address]  Basic metabolic panel     Status: Abnormal   Collection Time: 12/09/19  4:45 AM  Result Value Ref Range   Sodium 131 (L) 135 - 145 mmol/L   Potassium 4.3 3.5 - 5.1 mmol/L   Chloride 93 (L) 98 - 111 mmol/L   CO2 26 22 - 32 mmol/L   Glucose, Bld 106 (H) 70 - 99 mg/dL    Comment: Glucose reference range applies only to samples taken after fasting for at least 8 hours.   BUN 102 (H) 8 - 23 mg/dL   Creatinine, Ser 5.12 (H) 0.61 - 1.24 mg/dL   Calcium 8.0 (L) 8.9 - 10.3 mg/dL   GFR calc non Af Amer 11 (L) >60 mL/min   GFR calc Af Amer 12 (L) >60 mL/min   Anion gap 12 5 - 15    Comment: Performed at [facility] [address] [place], [place] [address]  Cooxemetry Panel (carboxy, met, total hgb, O2 sat)     Status: Abnormal   Collection Time: 12/09/19  4:46 AM  Result Value Ref Range   Total hemoglobin 8.0 (L) 12.0 - 16.0 g/dL   O2 Saturation 78.7 %   Carboxyhemoglobin 2.1 (H) 0.5 - 1.5 %   Methemoglobin 1.2 0.0 - 1.5 %    Comment: Performed at [facility] [address] [place], [place] [address]  Glucose, capillary     Status: Abnormal   Collection Time: 12/09/19  6:51 AM  Result Value Ref Range   Glucose-Capillary 208 (H) 70 - 99 mg/dL    Comment: Glucose reference range applies only to samples taken after fasting for at least 8 hours.  Glucose, capillary      Status: Abnormal   Collection Time: 12/09/19 11:21 AM  Result Value Ref Range   Glucose-Capillary 63 (L) 70 - 99 mg/dL    Comment: Glucose reference range applies only to samples taken after fasting for at least 8 hours.  Glucose, capillary     Status: None   Collection Time: 12/09/19 11:50 AM  Result Value Ref Range   Glucose-Capillary 91 70 - 99 mg/dL    Comment: Glucose reference range applies only to samples taken after fasting for at least 8 hours.     ROS:  Unable to obtain from patient.  Somnolent but does awaken  Physical Exam: Vitals:   12/09/19 1000 12/09/19 1123  BP: 101/71   Pulse: 87   Resp: Marland Kitchen)  21   Temp:  98 F (36.7 C)  SpO2: 100%      General: Somnolent gentleman appears chronically ill. HEENT: Normocephalic atraumatic external appearance was normal oxygen to nasal cannula Eyes: Pupils round equal reactive extraocular movements intact Neck: Supple no thyromegaly JVP not elevated Heart: Regular rate and rhythm positive for systolic ejection murmur Lungs: Rhonchorous breath sounds bilaterally poor effort Abdomen: Soft nontender no hepatosplenomegaly no bruits no masses Extremities: Appears to have bilateral lower extremity edema 1+ pitting left knee wound bandage Skin: Ecchymotic wound well perfused Neuro: Alert oriented moving all 4 limbs  Assessment/Plan: 1.Acute kidney injury.  Most likely secondary to acute tubular necrosis.  CT scan does not develop any evidence of hydronephrosis or nephrolithiasis although there are small bilateral renal exophytic lesions that are unlikely to represent cysts.  Urinalysis is bland with no evidence of any activity.  Will check urine sodium.  Clinically, this seems to be in the setting of IV contrast use with TAVR 11/12/2019, hemodynamic instability and intravascular volume depletion.  There was administration of IV vancomycin 12/04/2019.  Question whether this could be involved or not.  Patient fairly somnolent with worsening  creatinine.  Has extravascular volume expansion.  Has responded to IV diuretics these are being discontinued.  Weight is down.  CVP is 4.  However chest x-ray does show some bilateral infiltrates 12/08/2019.  Discussed with Dr. Haroldine Laws.  Trial of CRRT may need to be initiated in the future.  There is no indications for urgent dialysis at this point.  Patient may be mildly volume deplete.  We will continue to follow. 2. Hypertension/volume  -has congestive heart failure.  Systolic dysfunction with an EF of 40-45% 12/01/2019.  Status post TAVR complicated by hypotension and hemodynamic instability.  JVP 4 would suggest intravascular volume depletion.  IV Lasix has been discontinued 3.  Anemia.  Has been receiving IV iron.  Once iron stores are replete may be a candidate for ESA therapy. 4.  Acute on chronic congestive heart failure with RV dysfunction.  Has MDT CRT-P device.  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 5.  CAD status post PCI to RCA 07/2019 continues on Plavix statin. 6.  Atrial fibrillation with biventricular pacing IV amiodarone 7.  COPD recent quit smoking 8.  Infectious disease Serratia and stenotrophomonas pneumonia.  Levaquin per [place] 12/09/2019, 12:25 PM

## 2019-12-09 DEATH — deceased

## 2019-12-10 ENCOUNTER — Ambulatory Visit: Payer: Medicare HMO | Admitting: Physician Assistant

## 2019-12-10 ENCOUNTER — Inpatient Hospital Stay (HOSPITAL_COMMUNITY): Payer: Medicare HMO

## 2019-12-10 LAB — GLUCOSE, CAPILLARY
Glucose-Capillary: 101 mg/dL — ABNORMAL HIGH (ref 70–99)
Glucose-Capillary: 137 mg/dL — ABNORMAL HIGH (ref 70–99)
Glucose-Capillary: 93 mg/dL (ref 70–99)
Glucose-Capillary: 89 mg/dL (ref 70–99)

## 2019-12-10 LAB — POCT I-STAT 7, (LYTES, BLD GAS, ICA,H+H)
Acid-Base Excess: 3 mmol/L — ABNORMAL HIGH (ref 0.0–2.0)
Bicarbonate: 28.6 mmol/L — ABNORMAL HIGH (ref 20.0–28.0)
Calcium, Ion: 1.13 mmol/L — ABNORMAL LOW (ref 1.15–1.40)
HCT: 24 % — ABNORMAL LOW (ref 39.0–52.0)
Hemoglobin: 8.2 g/dL — ABNORMAL LOW (ref 13.0–17.0)
O2 Saturation: 98 %
Patient temperature: 97.89
Potassium: 4.4 mmol/L (ref 3.5–5.1)
Sodium: 131 mmol/L — ABNORMAL LOW (ref 135–145)
TCO2: 30 mmol/L (ref 22–32)
pCO2 arterial: 50.7 mmHg — ABNORMAL HIGH (ref 32.0–48.0)
pH, Arterial: 7.358 (ref 7.350–7.450)
pO2, Arterial: 118 mmHg — ABNORMAL HIGH (ref 83.0–108.0)

## 2019-12-10 LAB — RENAL FUNCTION PANEL
Albumin: 1.8 g/dL — ABNORMAL LOW (ref 3.5–5.0)
Anion gap: 15 (ref 5–15)
BUN: 107 mg/dL — ABNORMAL HIGH (ref 8–23)
CO2: 25 mmol/L (ref 22–32)
Calcium: 8.2 mg/dL — ABNORMAL LOW (ref 8.9–10.3)
Chloride: 92 mmol/L — ABNORMAL LOW (ref 98–111)
Creatinine, Ser: 5.73 mg/dL — ABNORMAL HIGH (ref 0.61–1.24)
GFR calc Af Amer: 11 mL/min — ABNORMAL LOW (ref >60)
GFR calc non Af Amer: 9 mL/min — ABNORMAL LOW (ref >60)
Glucose, Bld: 94 mg/dL (ref 70–99)
Phosphorus: 4.6 mg/dL (ref 2.5–4.6)
Potassium: 4.4 mmol/L (ref 3.5–5.1)
Sodium: 132 mmol/L — ABNORMAL LOW (ref 135–145)

## 2019-12-10 LAB — CBC WITH DIFFERENTIAL/PLATELET
Abs Immature Granulocytes: 0.05 10*3/uL (ref 0.00–0.07)
Basophils Absolute: 0 10*3/uL (ref 0.0–0.1)
Basophils Relative: 0 %
Eosinophils Absolute: 0 10*3/uL (ref 0.0–0.5)
Eosinophils Relative: 0 %
HCT: 25 % — ABNORMAL LOW (ref 39.0–52.0)
Hemoglobin: 8.1 g/dL — ABNORMAL LOW (ref 13.0–17.0)
Immature Granulocytes: 0 %
Lymphocytes Relative: 2 %
Lymphs Abs: 0.2 10*3/uL — ABNORMAL LOW (ref 0.7–4.0)
MCH: 29.6 pg (ref 26.0–34.0)
MCHC: 32.4 g/dL (ref 30.0–36.0)
MCV: 91.2 fL (ref 80.0–100.0)
Monocytes Absolute: 0.3 10*3/uL (ref 0.1–1.0)
Monocytes Relative: 2 %
Neutro Abs: 11.3 10*3/uL — ABNORMAL HIGH (ref 1.7–7.7)
Neutrophils Relative %: 96 %
Platelets: 50 10*3/uL — ABNORMAL LOW (ref 150–400)
RBC: 2.74 MIL/uL — ABNORMAL LOW (ref 4.22–5.81)
RDW: 16.5 % — ABNORMAL HIGH (ref 11.5–15.5)
WBC: 11.9 10*3/uL — ABNORMAL HIGH (ref 4.0–10.5)
nRBC: 0 % (ref 0.0–0.2)

## 2019-12-10 LAB — PROTIME-INR
INR: 1.5 — ABNORMAL HIGH (ref 0.8–1.2)
Prothrombin Time: 17.1 seconds — ABNORMAL HIGH (ref 11.4–15.2)

## 2019-12-10 LAB — COOXEMETRY PANEL
Carboxyhemoglobin: 2.1 % — ABNORMAL HIGH (ref 0.5–1.5)
Methemoglobin: 1.4 % (ref 0.0–1.5)
O2 Saturation: 63.8 %
Total hemoglobin: 8.5 g/dL — ABNORMAL LOW (ref 12.0–16.0)

## 2019-12-10 MED ORDER — NOREPINEPHRINE 4 MG/250ML-% IV SOLN
INTRAVENOUS | Status: AC
  Administered 2019-12-10: 4 mg
  Filled 2019-12-10: qty 250

## 2019-12-10 MED ORDER — AMIODARONE HCL 200 MG PO TABS
200.0000 mg | ORAL_TABLET | Freq: Two times a day (BID) | ORAL | Status: DC
  Administered 2019-12-10 – 2019-12-12 (×6): 200 mg via ORAL
  Filled 2019-12-10 (×7): qty 1

## 2019-12-10 MED ORDER — HEPARIN SODIUM (PORCINE) 1000 UNIT/ML IJ SOLN
3000.0000 [IU] | Freq: Once | INTRAMUSCULAR | Status: DC
  Filled 2019-12-10: qty 3

## 2019-12-10 MED ORDER — PRISMASOL BGK 4/2.5 32-4-2.5 MEQ/L REPLACEMENT SOLN: Status: DC

## 2019-12-10 MED ORDER — NOREPINEPHRINE 4 MG/250ML-% IV SOLN
0.0000 ug/min | INTRAVENOUS | Status: DC
  Administered 2019-12-10: 10 ug/min via INTRAVENOUS

## 2019-12-10 MED ORDER — ALBUMIN HUMAN 25 % IV SOLN
25.0000 g | Freq: Once | INTRAVENOUS | Status: AC
  Administered 2019-12-10: 25 g via INTRAVENOUS
  Filled 2019-12-10: qty 100

## 2019-12-10 MED ORDER — CHLORHEXIDINE GLUCONATE CLOTH 2 % EX PADS: 6.0000 | MEDICATED_PAD | Freq: Every day | CUTANEOUS | Status: DC

## 2019-12-10 MED ORDER — SODIUM BICARBONATE 8.4 % IV SOLN
100.0000 meq | Freq: Once | INTRAVENOUS | Status: AC
  Administered 2019-12-10: 100 meq via INTRAVENOUS
  Filled 2019-12-10: qty 100

## 2019-12-10 MED ORDER — ONDANSETRON HCL 4 MG/2ML IJ SOLN
4.0000 mg | Freq: Four times a day (QID) | INTRAMUSCULAR | Status: DC | PRN
  Administered 2019-12-10: 4 mg via INTRAVENOUS
  Filled 2019-12-10: qty 2

## 2019-12-10 MED ORDER — HEPARIN SODIUM (PORCINE) 1000 UNIT/ML DIALYSIS
1000.0000 [IU] | INTRAMUSCULAR | Status: DC | PRN
  Filled 2019-12-10 (×2): qty 6

## 2019-12-10 MED ORDER — HEPARIN SODIUM (PORCINE) 1000 UNIT/ML IJ SOLN: 1000.0000 [IU] | INTRAMUSCULAR | Status: DC | PRN

## 2019-12-10 MED ORDER — LEVOFLOXACIN IN D5W 500 MG/100ML IV SOLN: 500.0000 mg | INTRAVENOUS | Status: DC

## 2019-12-10 MED ORDER — FUROSEMIDE 10 MG/ML IJ SOLN
80.0000 mg | Freq: Once | INTRAMUSCULAR | Status: AC
  Administered 2019-12-10: 80 mg via INTRAVENOUS
  Filled 2019-12-10: qty 8

## 2019-12-10 MED ORDER — PRISMASOL BGK 4/2.5 32-4-2.5 MEQ/L IV SOLN: INTRAVENOUS | Status: DC

## 2019-12-10 MED ORDER — LEVOFLOXACIN IN D5W 500 MG/100ML IV SOLN
500.0000 mg | INTRAVENOUS | Status: AC
  Administered 2019-12-11 – 2019-12-12 (×2): 500 mg via INTRAVENOUS
  Filled 2019-12-10 (×2): qty 100

## 2019-12-10 NOTE — Plan of Care (Signed)
Will plan for CRRT through weekend to optimize UF - he's now on 15 Liters and hasn't been able to get HD yet.    Lasix once now  Claudia Desanctis, MD 4:21 PM 12/10/2019

## 2019-12-10 NOTE — Progress Notes (Signed)
eLink Physician-Brief Progress Note Patient Name: Justin Boone DOB: 1951-05-02 MRN: [id number]   Date of Service  12/10/2019  HPI/Events of Note  Hypotension on CRRT.  eICU Interventions  Norepinephrine infusion ordered, Albumin 25 % 25 gm iv x 1, stat ABG to r/o acidosis, will consider stress dose steroids if ABG unremarkable for acidosis and hypotension persists despite pressors.        [name] 12/10/2019, 10:04 PM

## 2019-12-10 NOTE — Plan of Care (Signed)
Seen and examined on CRRT.  Called by RN regarding pressures 70's.  Critical care has started levo.  Blood pressure 97/56 and HR 105 now after levo which is running at 10 mcg/min.  Bicarb 1 amp given and critical care just ordered albumin - agree.  For now UF is at zero - leeway to increase to up to net neg 100 /hr as tolerated.  Discussed with nursing.  Left IJ nontunneled catheter in use.  On cpap.    Claudia Desanctis, MD 12/10/2019 10:09 PM

## 2019-12-10 NOTE — Progress Notes (Addendum)
Patient ID: Justin Boone, male   DOB: June 23, 1950, 69 y.o.   MRN: [id number]     Advanced Heart Failure Rounding Note  PCP-Cardiologist: Sherren Mocha, MD   Subjective:    Underwent TAVR 11/16/2019  Milrinone turned off yesterday. Co-ox 64% today.    SCr/ BUN continues to rise. SCr 3.33 -> 3.37->3.25->3.67-> 4.13->5.12->5.73. BUN 107 today. K 4.4. Oliguric 500 cc in UOP yesterday. Diuretics remain on hold. Nephrology following. Urine sodium 11.   CVP 8. Wt trending up, 170>>174 lb. Increased O2 requirements overnight. Got SOB, required HFNC 15/Lmin. Down to 6L/min currently. Markedly edematous.    On abx for PNA. Sputum culture + for stenotrophamonas and serratia. AF. WBC stable ~12.   Platelets trending back up, 46>>50 K. Hgb 7.7>>8.1    Objective:   Weight Range: 79.2 kg Body mass index is 28.18 kg/m.   Vital Signs:   Temp:  [97.6 F (36.4 C)-98.2 F (36.8 C)] 98.2 F (36.8 C) (07/02 0753) Pulse Rate:  [48-117] 96 (07/02 0700) Resp:  [15-30] 21 (07/02 0700) BP: (101-128)/(62-103) 110/71 (07/02 0700) SpO2:  [85 %-100 %] 97 % (07/02 0753) Weight:  [79.2 kg] 79.2 kg (07/02 0500) Last BM Date: 12/07/19  Weight change: Filed Weights   12/08/19 0652 12/09/19 0500 12/10/19 0500  Weight: 78.1 kg 77.2 kg 79.2 kg    Intake/Output:   Intake/Output Summary (Last 24 hours) at 12/10/2019 0816 Last data filed at 12/10/2019 0200 Gross per 24 hour  Intake 427.27 ml  Output 500 ml  Net -72.73 ml      Physical Exam   CVP 8  General:  sitting up in chair. Requiring 6L [place] but no increased WOB HEENT: normal Neck: supple.  JVD ~8 cm. Carotids 2+ bilat; no bruits. No lymphadenopathy or thyromegaly appreciated. Cor: PMI nondisplaced. Irregular rhythm, regular rate. No rubs, gallops or murmurs. Lungs: crackles LLL no wheezing  Abdomen: soft, nontender, nondistended. No hepatosplenomegaly. No bruits or masses. Good bowel sounds. Extremities: no cyanosis, clubbing, rash, 2+ bilateral  LEE up to thighs, bilateral compression wraps  Neuro: alert & oriented x 3, cranial nerves grossly intact. moves all 4 extremities w/o difficulty. Affect pleasant.  Telemetry    Paced  90s- low 100s     Labs    CBC Recent Labs    12/09/19 0445 12/10/19 0406  WBC 12.1* 11.9*  NEUTROABS 11.4* 11.3*  HGB 7.7* 8.1*  HCT 23.4* 25.0*  MCV 88.6 91.2  PLT 46* 50*   Basic Metabolic Panel Recent Labs    12/08/19 0533 12/08/19 0533 12/09/19 0445 12/10/19 0618  NA 133*   < > 131* 132*  K 4.3   < > 4.3 4.4  CL 93*   < > 93* 92*  CO2 29   < > 26 25  GLUCOSE 104*   < > 106* 94  BUN 90*   < > 102* 107*  CREATININE 4.13*   < > 5.12* 5.73*  CALCIUM 8.1*   < > 8.0* 8.2*  MG 2.5*  --   --   --   PHOS 4.1  --   --  4.6   < > = values in this interval not displayed.   Liver Function Tests Recent Labs    12/10/19 0618  ALBUMIN 1.8*   No results for input(s): LIPASE, AMYLASE in the last 72 hours. Cardiac Enzymes No results for input(s): CKTOTAL, CKMB, CKMBINDEX, TROPONINI in the last 72 hours.  BNP: BNP (last 3 results) Recent Labs  11/15/2019 2002 11/29/19 1513  BNP 3,282.1* 2,503.4*    ProBNP (last 3 results) No results for input(s): PROBNP in the last 8760 hours.   D-Dimer No results for input(s): DDIMER in the last 72 hours. Hemoglobin A1C No results for input(s): HGBA1C in the last 72 hours. Fasting Lipid Panel No results for input(s): CHOL, HDL, LDLCALC, TRIG, CHOLHDL, LDLDIRECT in the last 72 hours. Thyroid Function Tests No results for input(s): TSH, T4TOTAL, T3FREE, THYROIDAB in the last 72 hours.  Invalid input(s): FREET3  Other results:   Imaging    No results found.   Medications:     Scheduled Medications: . sodium chloride   Intravenous Once  . atorvastatin  80 mg Oral q1800  . Chlorhexidine Gluconate Cloth  6 each Topical Daily  . Chlorhexidine Gluconate Cloth  6 each Topical Q0600  . clopidogrel  75 mg Oral Daily  . DULoxetine  60 mg  Oral Daily  . feeding supplement (ENSURE ENLIVE)  237 mL Oral BID BM  . guaiFENesin  600 mg Oral BID  . insulin aspart  0-9 Units Subcutaneous TID WC  . mouth rinse  15 mL Mouth Rinse BID  . mometasone-formoterol  2 puff Inhalation BID  . multivitamin with minerals  1 tablet Oral Daily  . nicotine  21 mg Transdermal Daily  . pantoprazole  40 mg Oral Daily  . sodium chloride flush  10-40 mL Intracatheter Q12H  . sodium chloride flush  3 mL Intravenous Q12H  . tamsulosin  0.4 mg Oral QPC breakfast    Infusions: . sodium chloride    . amiodarone 30 mg/hr (12/10/19 0200)  . ferumoxytol Stopped (12/09/19 7619)  . levofloxacin (LEVAQUIN) IV Stopped (12/08/19 1013)    PRN Medications: sodium chloride, acetaminophen **OR** acetaminophen, ipratropium, levalbuterol, melatonin, sodium chloride flush, sodium chloride flush   Assessment/Plan   1. Acute on chronic systolic CHF with prominent RV dysfunction: Has MDT CRT-P device.  Most recent echo with EF 40-45% with severely dilated RV with moderate systolic dysfunction.  RV function looks worse than pre-op but was not normal previously.  Hs-TnI not significantly elevated so doubt RV infarct/closure of RCA stent.  Suspect RV dysfunction is due to history of severe COPD.   - Milrinone stopped 7/1.  - CO-OX 64%.  - CVP 8 but suspect prominent 3rd spacing. Remains fluid overloaded w/ worsening renal function and oliguria. Needs HD for volume removal. Nephrology following.   2. AKI on CKD stage 3: Baseline creatinine 2.  - Creatinine continues to rise 3.33 -> 3.37->3.25->3.67-> 4.13->5.12->5.73. BUN 107. Oliguric. K 4.4.   -Suspect multifactorial => cardiorenal, contrast, hypotension/ATN.   - remains fluid overloaded and wt trending up  - needs HD for fluid removal.  - nephrology following  3. CAD: s/p PCI to RCA in 2/21. As above, HS-TnI not significantly elevated, no evidence for RV infarct.  - Denies s/s of angina - Continue Plavix, statin.    4. Atrial fibrillation, paroxysmal: A-paced on admit, now in underlying afib with BiV pacing.  -  Stop IV amio now off milrinone. Convert to PO 200 mg bid   - Currently holding anticoagulation with anemia/concern for bleeding.  5. Anemia, acute blood loss: required transfusion.  - Hgb 8.1 today. Anemia panel c/w IDA. - No source of bleeding found yet.  No RP hematoma on CT.    - FOBT ordered  -  Feraheme given 7/1  6. Thrombocytopenia: Plts starting to trend up, 46>>50 K. Suspect due to critical  illness.  No bleeding  7. Severe AS: S/p TAVR. Bioprosthetic aortic valve stable on post-op echo.  8. COPD with acute on chronic respiratory failure: Severe, recently quit smoking.   - Prednisone begun 6/26 by CCM.  - continue breathing treatments  9. ID: Concern for HCAP. Bilateral airspace disease on CXR.  - Sputum culture + for stenotrophamonas and serratia. AF - PCT 0.85  - PCCM managing abx. Per PCCM, recommend continuing levofloxacin regardless of steno ceftazidime susceptibility.   Length of Stay: [phone] PA-C  12/10/2019, 8:16 AM  Advanced Heart Failure Team Pager [phone] (M-F; 7a - 4p)  Please contact [facility] for night-coverage after hours (4p -7a ) and weekends on [email or web address]  Agree with above.   Co-ox and BP ok off milrinone. However creatinine continues to go up and now with progressive SOB and O2 requirement.   General:  Sitting up in chair mildly SOB HEENT: normal Neck: supple. JVP to jaw. Carotids 2+ bilat; no bruits. No lymphadenopathy or thryomegaly appreciated. Cor: PMI nondisplaced. irregular rate & rhythm. No rubs, gallops or murmurs. Lungs: + crackles Abdomen: soft, nontender, nondistended. No hepatosplenomegaly. No bruits or masses. Good bowel sounds. Extremities: no cyanosis, clubbing, rash, 3+ edema Neuro: alert & orientedx3, cranial nerves grossly intact. moves all 4 extremities w/o difficulty. Affect pleasant  He has progressive volume  overload and renal failure. Will need CRRT today. Have d/w CCM and Nephrology who agree. CCM will place catheter. Remains in AFib. No A/C with low platelets and ongoing slow blood loss.   CRITICAL CARE Performed by: Glori Bickers  Total critical care time: 35 minutes  Critical care time was exclusive of separately billable procedures and treating other patients.  Critical care was necessary to treat or prevent imminent or life-threatening deterioration.  Critical care was time spent personally by me (independent of midlevel providers or residents) on the following activities: development of treatment plan with patient and/or surrogate as well as nursing, discussions with consultants, evaluation of patient's response to treatment, examination of patient, obtaining history from patient or surrogate, ordering and performing treatments and interventions, ordering and review of laboratory studies, ordering and review of radiographic studies, pulse oximetry and re-evaluation of patient's condition.    Glori Bickers, MD  4:30 PM

## 2019-12-10 NOTE — Progress Notes (Signed)
PHARMACY NOTE:  ANTIMICROBIAL RENAL DOSAGE ADJUSTMENT  Current antimicrobial regimen includes a mismatch between antimicrobial dosage and estimated renal function.  As per policy approved by the Pharmacy & Therapeutics and Medical Executive Committees, the antimicrobial dosage will be adjusted accordingly.  Current antimicrobial dosage:  Levofloxacin 750mg  IV q48h  Indication: PNA  Renal Function:  Estimated Creatinine Clearance: 12.4 mL/min (A) (by C-G formula based on SCr of 5.73 mg/dL (H)). []      On intermittent HD, scheduled: [x]      On CRRT    Antimicrobial dosage has been changed to:  Levofloxacin 500mg  IV q24h  Additional comments:   Thank you for allowing pharmacy to be a part of this patient's care.  Arrie Senate, PharmD, BCPS Clinical Pharmacist [phone] Please check AMION for all [place] numbers 12/10/2019

## 2019-12-10 NOTE — Progress Notes (Addendum)
Chaplain notarized patient's AD.  Prior to leaving Chaplain asked if there was anything else the patient needed. He asked for prayer.  Chaplain asked what he would like [place] to pray for and he said to ask the [place] to let him live, so that's the prayer we sent heavenward.  Chaplain available if needed at any time.  De Burrs Chaplain Resident

## 2019-12-10 NOTE — Procedures (Signed)
Central Venous Catheter Insertion Procedure Note  Line type: Left, triple lumen HD cath   Indications:  Hemodialysis   Procedure Details:  Informed consent was obtained after explanation of the risks and benefits of the procedure, refer to the consent documentation. Time-out was performed immediately prior to the procedure.  The left internal jugular vein was identified using bedside ultrasound. This area was prepped and draped in the usual sterile fashion. Maximum sterile technique was used including antiseptics, cap, gloves, gown, hand hygiene, mask, and sterile sheet.  The patient was placed in Trendelenburgs position. Local anesthesia with 1% lidocaine was applied subcutaneously then deep to the skin. The needle was then inserted into the left, internal jugular vein using ultrasound guidance.  Using the Seldinger Technique a triple lumen HD cath was placed with each port easily flushed and freely drawing venous blood.  The catheter was secured with sutures. A sterile bandage was placed over the site.  Condition: The patient tolerated the procedure well and remains in the same condition as pre-procedure.  Complications: None; patient tolerated the procedure well.  Plan: CXR was ordered to verify placement.  Tamsen Snider, MD PGY2

## 2019-12-10 NOTE — Progress Notes (Addendum)
NAME:  Justin Boone, MRN:  [id number], DOB:  09-Dec-1950, LOS: 86 ADMISSION DATE:  11/19/2019, CONSULTATION DATE:  12/02/2010 REFERRING MD:  Dr. Sallyanne Kuster , CHIEF COMPLAINT:  Hypotension and hypoxia  Brief History   Patient is a 69 year old male who transferred from outside facility for consultation of cardiology and nephrology.  Underwent TAVR due to severe aortic stenosis on 6/22.  1 days following TAVR patient seen with presyncopal episodes.  On 6/24 patient again seen with presyncopal episode and development of hypoxia with persistent hypotension necessitating transfer to ICU and consult to PCCM  Past Medical History  Chronic atrial fibrillation anticoagulated on Coumadin Bradycardia status post pacemaker placement Systolic congestive heart failure Type 2 diabetes Hypertension Severe aortic stenosis now status post TAVR Chronic kidney disease stage III Gout COPD  Daily tobacco abuse Obstructive sleep apnea with nocturnal CPAP, reports compliance Hyperlipidemia CAD status post PCI  Significant Hospital Events   6/12 Admitted from outside facility 6/27:Had episode of desaturations requiring BIPAP 6/30 On HHF and Bipap at night. O2 requirements slowly improving. Lasix drip stopped 7/1 Renal consulted for worsening AKI  Consults:  Cardiology Nephrology PCCM Cardiothoracic surgery Dental medicine  Procedures:  Dental extraction of teeth 11, 13, and 14 6/16 TAVR 6/21  Significant Diagnostic Tests:  6/23 echo: LVEF 40-45% RV mildly reduced, RVSP 44  6/24 ct abd/pelvis: 1. No acute intra-abdominal or pelvic pathology. No intraperitoneal or retroperitoneal fluid collection or hematoma. 2. Partially visualized bilateral pleural effusions with findings concerning for multifocal pneumonia. Clinical correlation and follow-up to resolution recommended. 3. Diffuse subcutaneous edema and anasarca. 4. Aortic Atherosclerosis (ICD10-I70.0).  Micro Data:  Covid 6/12 >  negative Surgical PCR 6/16 > negative MRSA PCR screening 6/21 > negative Sputum 6/26>> steno, serratia  Antimicrobials:  Cefuroxime 6/22->6/24 cefepime 6/26->6/27 vanc 6/26 levaquin 6/28->  Interim history/subjective:  No acute events overnight.   Sitting in chair this morning on exam. Reports he was coughing up some brown stuff overnight. Denies any fever or chills.    Objective   Blood pressure 110/71, pulse 96, temperature 98.2 F (36.8 C), temperature source Oral, resp. rate (!) 21, height 5\' 6"  (1.676 m), weight 79.2 kg, SpO2 97 %.      On 6 L/min HFNC  Intake/Output Summary (Last 24 hours) at 12/10/2019 0912 Last data filed at 12/10/2019 0200 Gross per 24 hour  Intake 316.49 ml  Output 500 ml  Net -183.51 ml   Filed Weights   12/08/19 0652 12/09/19 0500 12/10/19 0500  Weight: 78.1 kg 77.2 kg 79.2 kg    Examination:  General: NAD, Sitting in chair on HFNC HE: Normocephalic, atraumatic , EOMI, Conjunctivae normal ENT: No congestion, no rhinorrhea, no exudate or erythema , JVD to mandable Cardiovascular: irregular rhythm, regular rate  No murmurs, rubs, or gallops Pulmonary : Effort normal, breath sounds normal. No wheezes, rales left lower lobe, no rhonchi Abdominal: soft, nontender,  bowel sounds present Ext: lower extremity edema  Skin: Warm, dry , no bruising, erythema, or rash Psychiatric/Behavioral:  normal mood, normal behavior    Labs reviewed significant for sodium 132, BUN/creatinine 107/5.73, WBC 11.9 PT 17.1 , INR 1.5 No new imaging  Resolved Hospital Problem list     Assessment & Plan:  COPD, respiratory failure Serratia and stenotrophomonas pneumonia Chest x-ray with bilateral infiltrates Wean down oxygen as tolerated, BiPAP as needed Follow chest x-ray Levaquin for 7 days, Stop date 7/4 Incentive spirometer, out of bed, mobilize  Acute on chronic systolic heart failure, LV dysfunction  Severe AS s/p TAVR Inotropes and diuretics per  cardiology Follow ScVo2  Acute on chronic kidney injury: Cr continue to worsen, 5.73 today Renal consulted by primary team Plan for HD PCCM will get consent and place HD Line  Prolonged qt:  Monitor telemetry  Thrombocytopenia Coagulopathy with elevated INR - last coumadin 6/23  Severe mscular deconditioning- PT and progressive mobility  Anemia, flank bruising- monitor clinically, no significant hematoma on CT  Best practice:  Diet: Cardiac Pain/Anxiety/Delirium protocol (if indicated): As needed VAP protocol (if indicated): Not applicable DVT prophylaxis: SCDs GI prophylaxis: PPI Glucose control: SSI Mobility: Bedrest Code Status: Full code Family Communication: Per primary Disposition: ICU pending inotrope wean and consistent improvement in resp status  Critical care time:

## 2019-12-10 NOTE — Progress Notes (Signed)
[facility] Progress Note  Name: Justin Boone MRN: [id number] DOB: 02-10-1951  Subjective:  He's been short of breath.  He's been off of diuretics and reportedly required up to 15liters overnight.  discussed with CHF - we both recommend HD.  He's willing to do dialysis - we discussed risks/benefits, and indications.  He hopes it's short term but I discussed we couldn't guarantee.  Discussed with critical care and they are able to get a vascath in for dialysis.  Review of systems:  Reports shortness of breath  Denies chest pain  Reports funny taste in mouth  Hx nausea but none at present ------------- Background on consult:  Rasool Rommel is a 69 y.o. male.  Admitted 6/7 /2021 to [place] and transferred to [facility] 11/12/2019 history significant for atrial fibrillation, bradycardia status post [employer] CRT-P, congestive heart failure with systolic dysfunction, hypertension and severe aortic stenosis.  History of coronary artery disease status post PCI history of tobacco abuse hyperlipidemia, gout, COPD and chronic kidney disease stage III.  As from epic records it seems that serum creatinine is between 2 and 2.5 mg/dL at baseline.  Underwent TAVR for severe aortic stenosis on 11/14/2019.  Course has been complicated by PNA and hypoxic resp failure.   Creatinine worsened after diuresis - charted as getting lasix 10 mg/hr 6/25 - 12/08/19; CVP was 4 per CHF.  and he did get vanc 6/26.  Urinalysis was negative for cellular elements positive for 100 mg/dL protein   Intake/Output Summary (Last 24 hours) at 12/10/2019 0850 Last data filed at 12/10/2019 0200 Gross per 24 hour  Intake 427.27 ml  Output 500 ml  Net -72.73 ml    Vitals:  Vitals:   12/10/19 0600 12/10/19 0645 12/10/19 0700 12/10/19 0753  BP: 110/71  110/71   Pulse: 90  96   Resp: 15  (!) 21   Temp:  98 F (36.7 C)  98.2 F (36.8 C)  TempSrc:  Oral  Oral  SpO2: 96%  100% 97%  Weight:       Height:         Physical Exam:  General adult male in chair - short of breath with exertion HEENT normocephalic atraumatic extraocular movements intact sclera anicteric Neck supple trachea midline Lungs decreased breath sounds, crackles at base; increased work of breathing with exertion - on 6 liters oxygen Heart S1S2 no rub Abdomen soft nontender nondistended Extremities 2+ edema upper, lower ext Psych normal mood and affect Neuro - alert and oriented x 3 provides hx and follows commands   Medications reviewed    Labs:  BMP Latest Ref Rng & Units 12/10/2019 12/09/2019 12/08/2019  Glucose 70 - 99 mg/dL 94 106(H) 104(H)  BUN 8 - 23 mg/dL 107(H) 102(H) 90(H)  Creatinine 0.61 - 1.24 mg/dL 5.73(H) 5.12(H) 4.13(H)  Sodium 135 - 145 mmol/L 132(L) 131(L) 133(L)  Potassium 3.5 - 5.1 mmol/L 4.4 4.3 4.3  Chloride 98 - 111 mmol/L 92(L) 93(L) 93(L)  CO2 22 - 32 mmol/L 25 26 29   Calcium 8.9 - 10.3 mg/dL 8.2(L) 8.0(L) 8.1(L)    Assessment/Plan:   1.Acute kidney injury.   - pre-renal and ischemic insults/ATN.  CT without hydronephrosis or nephrolithiasis although there are small bilateral renal exophytic lesions that may represent cysts.  Urinalysis is bland with no evidence of any activity.  Clinically, this seems to be in the setting of IV contrast use with TAVR 11/14/2019, hemodynamic instability and intravascular volume depletion.  There was administration  of IV vancomycin 12/04/2019.   - Appreciate CCM placing vascath for 7/2 - Will initiate HD on 7/2 after vascath placement to manage volume and for clearance/optimize resp status.  Plan for HD on 7/3, as well then assess needs daily.  If hemodynamics change to not support HD will transition to CRRT - will d/c cymbalta for now with renal dysfunction   2. Hypertension/volume  -has congestive heart failure.  Systolic dysfunction with an EF of 40-45% 12/01/2019.  Status post TAVR complicated by hypotension and hemodynamic instability  3. Acute  hypoxic resp failure - on supplemental oxygen.  With PNA as well.  Optimize volume with HD  4.  Acute on chronic congestive heart failure with RV dysfunction.    5.  CAD status post PCI to RCA 07/2019  6.  Anemia normocytic - s/p IV iron.  Anticipate need for ESA  7.  Atrial fibrillation with biventricular pacing IV amiodarone  8.  COPD recently quit smoking. Decreased respiratory reserve at baseline  9.  Serratia and stenotrophomonas pneumonia. Antibiotics per primary team   Claudia Desanctis, MD 12/10/2019 9:18 AM

## 2019-12-10 NOTE — Progress Notes (Signed)
[facility] Progress Note Patient Name: Justin Boone DOB: Oct 26, 1950 MRN: [id number]   Date of Service  12/10/2019  HPI/Events of Note  Patient is having some nausea. QTc  On last EKG on record 0.358.  eICU Interventions  Zofran 4 mg iv Q 6 hours prn nausea x 2 doses.        Kerry Kass Cage Gupton 12/10/2019, 8:26 PM

## 2019-12-11 LAB — RENAL FUNCTION PANEL
Albumin: 2.1 g/dL — ABNORMAL LOW (ref 3.5–5.0)
Albumin: 2.1 g/dL — ABNORMAL LOW (ref 3.5–5.0)
Anion gap: 14 (ref 5–15)
Anion gap: 9 (ref 5–15)
BUN: 74 mg/dL — ABNORMAL HIGH (ref 8–23)
BUN: 45 mg/dL — ABNORMAL HIGH (ref 8–23)
CO2: 25 mmol/L (ref 22–32)
CO2: 25 mmol/L (ref 22–32)
Calcium: 8 mg/dL — ABNORMAL LOW (ref 8.9–10.3)
Calcium: 7.9 mg/dL — ABNORMAL LOW (ref 8.9–10.3)
Chloride: 96 mmol/L — ABNORMAL LOW (ref 98–111)
Chloride: 101 mmol/L (ref 98–111)
Creatinine, Ser: 4.29 mg/dL — ABNORMAL HIGH (ref 0.61–1.24)
Creatinine, Ser: 2.83 mg/dL — ABNORMAL HIGH (ref 0.61–1.24)
GFR calc Af Amer: 15 mL/min — ABNORMAL LOW (ref >60)
GFR calc Af Amer: 26 mL/min — ABNORMAL LOW (ref >60)
GFR calc non Af Amer: 13 mL/min — ABNORMAL LOW (ref >60)
GFR calc non Af Amer: 22 mL/min — ABNORMAL LOW (ref >60)
Glucose, Bld: 100 mg/dL — ABNORMAL HIGH (ref 70–99)
Glucose, Bld: 114 mg/dL — ABNORMAL HIGH (ref 70–99)
Phosphorus: 3.6 mg/dL (ref 2.5–4.6)
Phosphorus: 3.3 mg/dL (ref 2.5–4.6)
Potassium: 4.4 mmol/L (ref 3.5–5.1)
Potassium: 4.5 mmol/L (ref 3.5–5.1)
Sodium: 135 mmol/L (ref 135–145)
Sodium: 135 mmol/L (ref 135–145)

## 2019-12-11 LAB — COOXEMETRY PANEL
Carboxyhemoglobin: 1.9 % — ABNORMAL HIGH (ref 0.5–1.5)
Methemoglobin: 1.3 % (ref 0.0–1.5)
O2 Saturation: 71.8 %
Total hemoglobin: 10.3 g/dL — ABNORMAL LOW (ref 12.0–16.0)

## 2019-12-11 LAB — CBC WITH DIFFERENTIAL/PLATELET
Abs Immature Granulocytes: 0.1 10*3/uL — ABNORMAL HIGH (ref 0.00–0.07)
Basophils Absolute: 0 10*3/uL (ref 0.0–0.1)
Basophils Relative: 0 %
Eosinophils Absolute: 0 10*3/uL (ref 0.0–0.5)
Eosinophils Relative: 0 %
HCT: 23.5 % — ABNORMAL LOW (ref 39.0–52.0)
Hemoglobin: 7.5 g/dL — ABNORMAL LOW (ref 13.0–17.0)
Immature Granulocytes: 1 %
Lymphocytes Relative: 2 %
Lymphs Abs: 0.2 10*3/uL — ABNORMAL LOW (ref 0.7–4.0)
MCH: 29.4 pg (ref 26.0–34.0)
MCHC: 31.9 g/dL (ref 30.0–36.0)
MCV: 92.2 fL (ref 80.0–100.0)
Monocytes Absolute: 0.2 10*3/uL (ref 0.1–1.0)
Monocytes Relative: 2 %
Neutro Abs: 10.9 10*3/uL — ABNORMAL HIGH (ref 1.7–7.7)
Neutrophils Relative %: 95 %
Platelets: 56 10*3/uL — ABNORMAL LOW (ref 150–400)
RBC: 2.55 MIL/uL — ABNORMAL LOW (ref 4.22–5.81)
RDW: 16.6 % — ABNORMAL HIGH (ref 11.5–15.5)
WBC: 11.4 10*3/uL — ABNORMAL HIGH (ref 4.0–10.5)
nRBC: 0 % (ref 0.0–0.2)

## 2019-12-11 LAB — PROTIME-INR
INR: 1.4 — ABNORMAL HIGH (ref 0.8–1.2)
Prothrombin Time: 16.6 seconds — ABNORMAL HIGH (ref 11.4–15.2)

## 2019-12-11 LAB — GLUCOSE, CAPILLARY
Glucose-Capillary: 106 mg/dL — ABNORMAL HIGH (ref 70–99)
Glucose-Capillary: 102 mg/dL — ABNORMAL HIGH (ref 70–99)
Glucose-Capillary: 112 mg/dL — ABNORMAL HIGH (ref 70–99)
Glucose-Capillary: 124 mg/dL — ABNORMAL HIGH (ref 70–99)

## 2019-12-11 LAB — PREPARE RBC (CROSSMATCH)

## 2019-12-11 LAB — MAGNESIUM: Magnesium: 2.5 mg/dL — ABNORMAL HIGH (ref 1.7–2.4)

## 2019-12-11 MED ORDER — SODIUM CHLORIDE 0.9% IV SOLUTION: Freq: Once | INTRAVENOUS | Status: AC

## 2019-12-11 MED ORDER — NOREPINEPHRINE 16 MG/250ML-% IV SOLN
0.0000 ug/min | INTRAVENOUS | Status: DC
  Administered 2019-12-11: 10 ug/min via INTRAVENOUS
  Administered 2019-12-12: 16 ug/min via INTRAVENOUS
  Administered 2019-12-13: 40 ug/min via INTRAVENOUS
  Filled 2019-12-11 (×4): qty 250

## 2019-12-11 MED ORDER — DARBEPOETIN ALFA 40 MCG/0.4ML IJ SOSY
40.0000 ug | PREFILLED_SYRINGE | INTRAMUSCULAR | Status: DC
  Administered 2019-12-11: 40 ug via SUBCUTANEOUS
  Filled 2019-12-11 (×2): qty 0.4

## 2019-12-11 NOTE — Plan of Care (Signed)

## 2019-12-11 NOTE — Progress Notes (Signed)
[facility] Progress Note  Name: Justin Boone MRN: [id number] DOB: 06/18/1950  Subjective:  He was started on CRRT on 7/2 PM after nontunneled catheter with critical care.  He was started on levo after developing hypotension.  Seen and examined on CRRT; procedure supervised.  110/67 and HR 93. left IJ vascath. He's only had 400 mL UF with CRRT over 7/2 but has tolerated net neg 100 ml/hr for the past couple of hours.  Has been on levo at 10 mcg/min.  Getting 1 unit PRBC's.  Spoke with CHF this am. Spoke with son at bedside on 7/2 PM. Has been on CPAP.   Review of systems:   Reports shortness of breath but better.  Denies chest pain  Hx nausea but none at present ------------- Background on consult:  Justin Boone is a 69 y.o. male.  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  History of coronary artery disease status post PCI history of tobacco abuse hyperlipidemia, gout, COPD and chronic kidney disease stage III.  As from epic records it seems that serum creatinine is between 2 and 2.5 mg/dL at baseline.  Underwent TAVR for severe aortic stenosis on 12/02/2019.  Course has been complicated by PNA and hypoxic resp failure.   Creatinine worsened after diuresis - charted as getting lasix 10 mg/hr 6/25 - 12/08/19; CVP was 4 per CHF.  and he did get vanc 6/26.  Urinalysis was negative for cellular elements positive for 100 mg/dL protein   Intake/Output Summary (Last 24 hours) at 12/11/2019 0615 Last data filed at 12/11/2019 0600 Gross per 24 hour  Intake 525.35 ml  Output 408 ml  Net 117.35 ml    Vitals:  Vitals:   12/11/19 0530 12/11/19 0545 12/11/19 0600 12/11/19 0602  BP: 110/74 116/63 112/67   Pulse: 91 91 94   Resp: 14 13 20    Temp:    (!) 96.4 F  (35.8 C)  TempSrc:    Axillary  SpO2: 97% 97% 96%   Weight:      Height:         Physical Exam:    General adult male in bed on CPAP HEENT normocephalic atraumatic extraocular movements intact sclera anicteric Neck supple trachea midline Lungs wheezing; on cpap; less work of breathing Heart S1S2 no rub Abdomen soft nontender nondistended Extremities trace to 1+ edema upper, lower ext; comp stockings Psych normal mood and affect Neuro - alert and oriented x 3 provides hx and follows commands Access left IJ nontunneled catheter  Medications reviewed    Labs:  BMP Latest Ref Rng & Units 12/11/2019 12/10/2019 12/10/2019  Glucose 70 - 99 mg/dL 100(H) - 94  BUN 8 - 23 mg/dL 74(H) - 107(H)  Creatinine 0.61 - 1.24 mg/dL 4.29(H) - 5.73(H)  Sodium 135 - 145 mmol/L 135 131(L) 132(L)  Potassium 3.5 - 5.1 mmol/L 4.4 4.4 4.4  Chloride 98 - 111 mmol/L 96(L) - 92(L)  CO2 22 - 32 mmol/L 25 - 25  Calcium 8.9 - 10.3 mg/dL 8.0(L) - 8.2(L)    Assessment/Plan:  1.Acute kidney injury   - pre-renal and ischemic insults/ATN.  CT without hydronephrosis or nephrolithiasis although there are small bilateral renal exophytic lesions that may represent cysts.  Urinalysis is bland with no evidence of any activity.  Clinically, this seems to be in the setting of  IV contrast use with TAVR 11/22/2019, hemodynamic instability and intravascular volume depletion.  There was administration of IV vancomycin 12/04/2019. CCM placedvascath 7/2 - Continue CRRT - 4K fluids.  Net neg 125ml /hr as tolerated - d/c'd cymbalta for now with renal dysfunction   2. Hypotension -has congestive heart failure.  Systolic dysfunction with an EF of 40-45% 12/01/2019.  Status post TAVR complicated by hypotension and hemodynamic instability.  On levo per critical care  3. Acute hypoxic resp failure - on supplemental oxygen.  With PNA as well.  Optimize volume with RRT as able  4.  Acute on chronic congestive heart failure with RV dysfunction  - optimize volume as able  5.  CAD status post PCI to RCA 07/2019  6.  Anemia normocytic - s/p feraheme on 7/1 and is ordered for additional dose 7/8. PRBC's on 7/3. Will give aranesp 40 mcg on 7/3.   7.  Atrial fibrillation with biventricular pacing IV amiodarone  8.  COPD recently quit smoking. Decreased respiratory reserve at baseline  9.  Serratia and stenotrophomonas pneumonia. Antibiotics per primary team   Claudia Desanctis, MD 12/11/2019 6:31 AM

## 2019-12-11 NOTE — Progress Notes (Addendum)
Patient ID: Justin Boone, male   DOB: May 05, 1951, 69 y.o.   MRN: [id number]     Advanced Heart Failure Rounding Note  PCP-Cardiologist: Sherren Mocha, MD   Subjective:    Underwent TAVR 11/15/2019  Started on CVVD on 7/2. Now pulling 100/hr. BP dropped and now on NE at 10. MAPs 80s.  Co-ox 72% CVP 17  Only 50cc urine output  Breathing ok on BIPAP with 60% O2  hgb 8.1 -> 7.5  ABG 7.35/51/118/98%   Objective:   Weight Range: 79.4 kg Body mass index is 28.25 kg/m.   Vital Signs:   Temp:  [96.6 F (35.9 C)-98.2 F (36.8 C)] 97.5 F (36.4 C) (07/03 0300) Pulse Rate:  [75-127] 96 (07/03 0430) Resp:  [10-28] 18 (07/03 0445) BP: (67-119)/(39-82) 101/58 (07/03 0415) SpO2:  [77 %-100 %] 97 % (07/03 0445) FiO2 (%):  [60 %-80 %] 60 % (07/03 0400) Weight:  [79.4 kg] 79.4 kg (07/03 0448) Last BM Date: 12/07/19  Weight change: Filed Weights   12/09/19 0500 12/10/19 0500 12/11/19 0448  Weight: 77.2 kg 79.2 kg 79.4 kg    Intake/Output:   Intake/Output Summary (Last 24 hours) at 12/11/2019 0503 Last data filed at 12/11/2019 0500 Gross per 24 hour  Intake 515.98 ml  Output 328 ml  Net 187.98 ml      Physical Exam   General:  Lying in bed  HEENT: normal Neck: supple. RIJ TLC LIJ trialysis Carotids 2+ bilat; no bruits. No lymphadenopathy or thryomegaly appreciated. Cor: PMI nondisplaced. Regular rate & rhythm. No rubs, gallops or murmurs. Lungs: clear Abdomen: soft, nontender, nondistended. No hepatosplenomegaly. No bruits or masses. Good bowel sounds. Extremities: no cyanosis, clubbing, rash, edema Neuro: alert & orientedx3, cranial nerves grossly intact. moves all 4 extremities w/o difficulty. Affect pleasant   Telemetry    AF biv paced 90 Personally reviewed   Labs    CBC Recent Labs    12/10/19 0406 12/10/19 0406 12/10/19 2205 12/11/19 0359  WBC 11.9*  --   --  11.4*  NEUTROABS 11.3*  --   --  10.9*  HGB 8.1*   < > 8.2* 7.5*  HCT 25.0*   < > 24.0* 23.5*    MCV 91.2  --   --  92.2  PLT 50*  --   --  56*   < > = values in this interval not displayed.   Basic Metabolic Panel Recent Labs    12/08/19 0533 12/09/19 0445 12/10/19 0618 12/10/19 0618 12/10/19 2205 12/11/19 0359  NA 133*   < > 132*   < > 131* 135  K 4.3   < > 4.4   < > 4.4 4.4  CL 93*   < > 92*  --   --  96*  CO2 29   < > 25  --   --  25  GLUCOSE 104*   < > 94  --   --  100*  BUN 90*   < > 107*  --   --  74*  CREATININE 4.13*   < > 5.73*  --   --  4.29*  CALCIUM 8.1*   < > 8.2*  --   --  8.0*  MG 2.5*  --   --   --   --   --   PHOS 4.1  --  4.6  --   --  3.6   < > = values in this interval not displayed.   Liver Function Tests Recent Labs  12/10/19 0618 12/11/19 0359  ALBUMIN 1.8* 2.1*   No results for input(s): LIPASE, AMYLASE in the last 72 hours. Cardiac Enzymes No results for input(s): CKTOTAL, CKMB, CKMBINDEX, TROPONINI in the last 72 hours.  BNP: BNP (last 3 results) Recent Labs    11/19/2019 2002 11/29/19 1513  BNP 3,282.1* 2,503.4*    ProBNP (last 3 results) No results for input(s): PROBNP in the last 8760 hours.   D-Dimer No results for input(s): DDIMER in the last 72 hours. Hemoglobin A1C No results for input(s): HGBA1C in the last 72 hours. Fasting Lipid Panel No results for input(s): CHOL, HDL, LDLCALC, TRIG, CHOLHDL, LDLDIRECT in the last 72 hours. Thyroid Function Tests No results for input(s): TSH, T4TOTAL, T3FREE, THYROIDAB in the last 72 hours.  Invalid input(s): FREET3  Other results:   Imaging    DG Chest Port 1 View  Result Date: 12/10/2019 CLINICAL DATA:  Central line placement. EXAM: PORTABLE CHEST 1 VIEW COMPARISON:  12/08/2019 FINDINGS: The right IJ catheter is stable. New left IJ central venous catheter tip is in the mid SVC. No complicating features. Pacer wires are stable. Surgical changes from TAVR again noted. Persistent diffuse interstitial and airspace process and right pleural effusion, likely pulmonary edema.  No significant change IMPRESSION: New left IJ central venous catheter in good position without complicating features. Persistent diffuse interstitial and airspace process, likely edema. Electronically Signed   By: Marijo Sanes M.D.   On: 12/10/2019 12:25     Medications:     Scheduled Medications: . sodium chloride   Intravenous Once  . amiodarone  200 mg Oral BID  . atorvastatin  80 mg Oral q1800  . Chlorhexidine Gluconate Cloth  6 each Topical Daily  . clopidogrel  75 mg Oral Daily  . feeding supplement (ENSURE ENLIVE)  237 mL Oral BID BM  . guaiFENesin  600 mg Oral BID  . heparin sodium (porcine)  3,000 Units Intravenous Once  . insulin aspart  0-9 Units Subcutaneous TID WC  . mouth rinse  15 mL Mouth Rinse BID  . mometasone-formoterol  2 puff Inhalation BID  . multivitamin with minerals  1 tablet Oral Daily  . nicotine  21 mg Transdermal Daily  . pantoprazole  40 mg Oral Daily  . sodium chloride flush  10-40 mL Intracatheter Q12H  . sodium chloride flush  3 mL Intravenous Q12H  . tamsulosin  0.4 mg Oral QPC breakfast    Infusions: .  prismasol BGK 4/2.5 400 mL/hr at 12/10/19 2035  .  prismasol BGK 4/2.5 300 mL/hr at 12/10/19 2035  . sodium chloride    . ferumoxytol Stopped (12/09/19 5885)  . levofloxacin (LEVAQUIN) IV    . norepinephrine (LEVOPHED) Adult infusion 10 mcg/min (12/11/19 0500)  . prismasol BGK 4/2.5 1,800 mL/hr at 12/11/19 0218    PRN Medications: sodium chloride, acetaminophen **OR** acetaminophen, heparin, heparin sodium (porcine), ipratropium, levalbuterol, melatonin, ondansetron (ZOFRAN) IV, sodium chloride flush, sodium chloride flush   Assessment/Plan   1. Acute on chronic systolic CHF with prominent RV dysfunction: Has MDT CRT-P device.  Most recent echo with EF 40-45% with severely dilated RV with moderate systolic dysfunction.  RV function looks worse than pre-op but was not normal previously.  Hs-TnI not significantly elevated so doubt RV  infarct/closure of RCA stent.  Suspect RV dysfunction is due to history of severe COPD.   - Milrinone stopped 7/1. On NE 10 for BP support with CVVHD - CO-OX 72%.  - Volume overloaded. Pulling -100 with  CVVHD 2. AKI on CKD stage 3: Baseline creatinine 2.  - Oliguric - CVVHD started on 7/2 . Now pulling 100/hr - Suspect multifactorial => cardiorenal, contrast, hypotension/ATN.   - He is clear that he would NOT want long-term HD. Hopefully will see some renal recovery. - nephrology following  3. CAD: s/p PCI to RCA in 2/21. As above, HS-TnI not significantly elevated, no evidence for RV infarct.  - Denies s/s of angina - Continue Plavix, statin.  4. Atrial fibrillation, paroxysmal: A-paced on admit, now in underlying afib with BiV pacing.  -  On po amio. Rate controlled - Currently holding anticoagulation with anemia/concern for bleeding.  5. Anemia, acute blood loss: required transfusion.  - Hgb 8.1 -> 7.5 today. Anemia panel c/w IDA. Received feraheme 7/1 - No source of bleeding found yet.  No RP hematoma on CT.  - Transfuse 1u RBCs today  - Likely need aranesp 6. Thrombocytopenia: Plts starting to trend up, 46>>50 -> 56 K. Suspect due to critical illness.  No bleeding  7. Severe AS: S/p TAVR. Bioprosthetic aortic valve stable on post-op echo.  8. COPD with acute on chronic respiratory failure: Severe, recently quit smoking.   - now on BIPAP due to volume overload. Wean as tolerated 9. ID: Concern for HCAP. Bilateral airspace disease on CXR.  - Sputum culture + for stenotrophamonas and serratia. AF - PCT 0.85  WBC down to 11.4 - PCCM managing abx. Per PCCM, recommend continuing levofloxacin regardless of steno ceftazidime susceptibility.   Length of Stay: [place], MD  12/11/2019, 5:03 AM  Advanced Heart Failure Team Pager [phone] (M-F; 7a - 4p)  Please contact [facility] for night-coverage after hours (4p -7a ) and weekends on [email or web address]

## 2019-12-11 NOTE — Progress Notes (Signed)
NAME:  Justin Boone, MRN:  [id number], DOB:  06-Jan-1951, LOS: 21 ADMISSION DATE:  11/22/2019, CONSULTATION DATE:  12/02/2010 REFERRING MD:  Dr. Sallyanne Kuster , CHIEF COMPLAINT:  Hypotension and hypoxia  Brief History   Patient is a 69 year old male who transferred from outside facility for consultation of cardiology and nephrology.  Underwent TAVR due to severe aortic stenosis on 6/22.  1 days following TAVR patient seen with presyncopal episodes.  On 6/24 patient again seen with presyncopal episode and development of hypoxia with persistent hypotension necessitating transfer to ICU and consult to PCCM  Past Medical History  Chronic atrial fibrillation anticoagulated on Coumadin Bradycardia status post pacemaker placement Systolic congestive heart failure Type 2 diabetes Hypertension Severe aortic stenosis now status post TAVR Chronic kidney disease stage III Gout COPD  Daily tobacco abuse Obstructive sleep apnea with nocturnal CPAP, reports compliance Hyperlipidemia CAD status post PCI  Significant Hospital Events   6/12 Admitted from outside facility 6/27:Had episode of desaturations requiring BIPAP 6/30 On HHF and Bipap at night. O2 requirements slowly improving. Lasix drip stopped 7/1 Renal consulted for worsening AKI  Consults:  Cardiology Nephrology PCCM Cardiothoracic surgery Dental medicine  Procedures:  Dental extraction of teeth 11, 13, and 14 6/16 TAVR 6/21  Significant Diagnostic Tests:  6/23 echo: LVEF 40-45% RV mildly reduced, RVSP 44  6/24 ct abd/pelvis: 1. No acute intra-abdominal or pelvic pathology. No intraperitoneal or retroperitoneal fluid collection or hematoma. 2. Partially visualized bilateral pleural effusions with findings concerning for multifocal pneumonia. Clinical correlation and follow-up to resolution recommended. 3. Diffuse subcutaneous edema and anasarca. 4. Aortic Atherosclerosis (ICD10-I70.0).  Micro Data:  Covid 6/12 >  negative Surgical PCR 6/16 > negative MRSA PCR screening 6/21 > negative Sputum 6/26>> steno, serratia  Antimicrobials:  Cefuroxime 6/22->6/24 cefepime 6/26->6/27 vanc 6/26 levaquin 6/28->  Interim history/subjective:   BiPAP overnight.  Was slightly hypotensive with start of CVVHD and started on norepinephrine to maintain mean arterial pressure greater than 65.  Discussed with nursing at bedside.  Objective   Blood pressure 120/61, pulse 83, temperature (!) 97.5 F (36.4 C), temperature source Axillary, resp. rate 15, height 5\' 6"  (1.676 m), weight 79.4 kg, SpO2 96 %. CVP:  [13 mmHg-48 mmHg] 13 mmHg  FiO2 (%):  [60 %-80 %] 60 % On 6 L/min HFNC  Intake/Output Summary (Last 24 hours) at 12/11/2019 0844 Last data filed at 12/11/2019 0800 Gross per 24 hour  Intake 594.1 ml  Output 607 ml  Net -12.9 ml   Filed Weights   12/09/19 0500 12/10/19 0500 12/11/19 0448  Weight: 77.2 kg 79.2 kg 79.4 kg    Examination:  General: Elderly male, resting comfortably on BiPAP, CVVHD going HE: NCAT, tracking appropriately Cardiovascular:  Irregular, S1-S2 Pulmonary : Bilateral pressure supported breaths with BiPAP Abdominal: Soft nontender nondistended Ext: No significant edema, bandage on left knee Skin: No obvious rash Neuro: Alert oriented following commands moves all 4  Labs reviewed: BUN 74, serum creatinine 4.29 White blood cell count 11.4  Resolved Hospital Problem list     Assessment & Plan:   Acute on chronic hypoxemic respiratory failure requiring BiPAP support as needed COPD, respiratory failure Serratia and stenotrophomonas pneumonia, resolved P: Continue to wean from BiPAP as tolerated Off BiPAP during the day with nasal cannula Levaquin 7 days, stop date on 12/12/2019 Continue I-S early mobility, out of bed up in chair as much as tolerated.  Acute on chronic systolic heart failure, LV dysfunction Severe AS s/p TAVR Continue norepinephrine to  maintain mean arterial  pressure greater than 65 mmHg  Acute on chronic kidney injury: Appreciate nephrology input CVVHD started Tolerating well at this time Looking for any sign of renal recovery Continue to follow urine output closely  Prolonged qt:  Under telemetry  Thrombocytopenia Coagulopathy with elevated INR Last dose Coumadin 12/01/2019  Severe mscular deconditioning-  -Continue PT OT input  Anemia, flank bruising-  Observe  Best practice:  Diet: Cardiac Pain/Anxiety/Delirium protocol (if indicated): As needed VAP protocol (if indicated): Not applicable DVT prophylaxis: SCDs GI prophylaxis: PPI Glucose control: SSI Mobility: Bedrest Code Status: Full code Family Communication: Per primary Disposition: ICU pending inotrope wean and consistent improvement in resp status  This patient is critically ill with multiple organ system failure; which, requires frequent high complexity decision making, assessment, support, evaluation, and titration of therapies. This was completed through the application of advanced monitoring technologies and extensive interpretation of multiple databases. During this encounter critical care time was devoted to patient care services described in this note for 32 minutes.  Garner Nash, DO [facility] 12/11/2019 8:44 AM

## 2019-12-12 LAB — RENAL FUNCTION PANEL
Albumin: 2 g/dL — ABNORMAL LOW (ref 3.5–5.0)
Albumin: 2 g/dL — ABNORMAL LOW (ref 3.5–5.0)
Anion gap: 9 (ref 5–15)
Anion gap: 9 (ref 5–15)
BUN: 29 mg/dL — ABNORMAL HIGH (ref 8–23)
BUN: 19 mg/dL (ref 8–23)
CO2: 25 mmol/L (ref 22–32)
CO2: 25 mmol/L (ref 22–32)
Calcium: 8 mg/dL — ABNORMAL LOW (ref 8.9–10.3)
Calcium: 7.7 mg/dL — ABNORMAL LOW (ref 8.9–10.3)
Chloride: 102 mmol/L (ref 98–111)
Chloride: 101 mmol/L (ref 98–111)
Creatinine, Ser: 2.09 mg/dL — ABNORMAL HIGH (ref 0.61–1.24)
Creatinine, Ser: 1.57 mg/dL — ABNORMAL HIGH (ref 0.61–1.24)
GFR calc Af Amer: 37 mL/min — ABNORMAL LOW (ref >60)
GFR calc Af Amer: 52 mL/min — ABNORMAL LOW (ref >60)
GFR calc non Af Amer: 32 mL/min — ABNORMAL LOW (ref >60)
GFR calc non Af Amer: 45 mL/min — ABNORMAL LOW (ref >60)
Glucose, Bld: 110 mg/dL — ABNORMAL HIGH (ref 70–99)
Glucose, Bld: 82 mg/dL (ref 70–99)
Phosphorus: 2.5 mg/dL (ref 2.5–4.6)
Phosphorus: 1.9 mg/dL — ABNORMAL LOW (ref 2.5–4.6)
Potassium: 4.4 mmol/L (ref 3.5–5.1)
Potassium: 4.1 mmol/L (ref 3.5–5.1)
Sodium: 136 mmol/L (ref 135–145)
Sodium: 135 mmol/L (ref 135–145)

## 2019-12-12 LAB — CBC WITH DIFFERENTIAL/PLATELET
Abs Immature Granulocytes: 0.06 10*3/uL (ref 0.00–0.07)
Basophils Absolute: 0 10*3/uL (ref 0.0–0.1)
Basophils Relative: 0 %
Eosinophils Absolute: 0.1 10*3/uL (ref 0.0–0.5)
Eosinophils Relative: 1 %
HCT: 26.1 % — ABNORMAL LOW (ref 39.0–52.0)
Hemoglobin: 8.1 g/dL — ABNORMAL LOW (ref 13.0–17.0)
Immature Granulocytes: 1 %
Lymphocytes Relative: 2 %
Lymphs Abs: 0.2 10*3/uL — ABNORMAL LOW (ref 0.7–4.0)
MCH: 28.7 pg (ref 26.0–34.0)
MCHC: 31 g/dL (ref 30.0–36.0)
MCV: 92.6 fL (ref 80.0–100.0)
Monocytes Absolute: 0.4 10*3/uL (ref 0.1–1.0)
Monocytes Relative: 4 %
Neutro Abs: 9.6 10*3/uL — ABNORMAL HIGH (ref 1.7–7.7)
Neutrophils Relative %: 92 %
Platelets: 58 10*3/uL — ABNORMAL LOW (ref 150–400)
RBC: 2.82 MIL/uL — ABNORMAL LOW (ref 4.22–5.81)
RDW: 17.6 % — ABNORMAL HIGH (ref 11.5–15.5)
WBC: 10.3 10*3/uL (ref 4.0–10.5)
nRBC: 0 % (ref 0.0–0.2)

## 2019-12-12 LAB — BPAM RBC
Blood Product Expiration Date: 202107152359
Blood Product Expiration Date: 202107302359
ISSUE DATE / TIME: 202106300933
ISSUE DATE / TIME: 202107030557
Unit Type and Rh: 5100
Unit Type and Rh: 5100

## 2019-12-12 LAB — MAGNESIUM: Magnesium: 2.5 mg/dL — ABNORMAL HIGH (ref 1.7–2.4)

## 2019-12-12 LAB — TYPE AND SCREEN
ABO/RH(D): O POS
Antibody Screen: NEGATIVE
Unit division: 0
Unit division: 0

## 2019-12-12 LAB — GLUCOSE, CAPILLARY
Glucose-Capillary: 96 mg/dL (ref 70–99)
Glucose-Capillary: 160 mg/dL — ABNORMAL HIGH (ref 70–99)
Glucose-Capillary: 153 mg/dL — ABNORMAL HIGH (ref 70–99)

## 2019-12-12 LAB — COOXEMETRY PANEL
Carboxyhemoglobin: 2.3 % — ABNORMAL HIGH (ref 0.5–1.5)
Methemoglobin: 1.4 % (ref 0.0–1.5)
O2 Saturation: 79.9 %
Total hemoglobin: 8.5 g/dL — ABNORMAL LOW (ref 12.0–16.0)

## 2019-12-12 LAB — PROTIME-INR
INR: 1.3 — ABNORMAL HIGH (ref 0.8–1.2)
Prothrombin Time: 15.8 seconds — ABNORMAL HIGH (ref 11.4–15.2)

## 2019-12-12 MED ORDER — DARBEPOETIN ALFA 60 MCG/0.3ML IJ SOSY: 60.0000 ug | PREFILLED_SYRINGE | INTRAMUSCULAR | Status: DC

## 2019-12-12 MED ORDER — REVEFENACIN 175 MCG/3ML IN SOLN
175.0000 ug | Freq: Every day | RESPIRATORY_TRACT | Status: DC
  Administered 2019-12-12 – 2019-12-13 (×2): 175 ug via RESPIRATORY_TRACT
  Filled 2019-12-12 (×3): qty 3

## 2019-12-12 MED ORDER — SODIUM PHOSPHATES 45 MMOLE/15ML IV SOLN
20.0000 mmol | Freq: Once | INTRAVENOUS | Status: AC
  Administered 2019-12-12: 20 mmol via INTRAVENOUS
  Filled 2019-12-12: qty 6.67

## 2019-12-12 MED ORDER — ARFORMOTEROL TARTRATE 15 MCG/2ML IN NEBU
15.0000 ug | INHALATION_SOLUTION | Freq: Two times a day (BID) | RESPIRATORY_TRACT | Status: DC
  Administered 2019-12-12 – 2019-12-13 (×4): 15 ug via RESPIRATORY_TRACT
  Filled 2019-12-12 (×4): qty 2

## 2019-12-12 MED ORDER — BUDESONIDE 0.25 MG/2ML IN SUSP
0.2500 mg | Freq: Two times a day (BID) | RESPIRATORY_TRACT | Status: DC
  Administered 2019-12-12 – 2019-12-13 (×4): 0.25 mg via RESPIRATORY_TRACT
  Filled 2019-12-12 (×4): qty 2

## 2019-12-12 NOTE — Progress Notes (Signed)
Patient ID: Justin Boone, male   DOB: 1950-09-04, 69 y.o.   MRN: [id number]     Advanced Heart Failure Rounding Note  PCP-Cardiologist: Sherren Mocha, MD   Subjective:    Underwent TAVR 11/11/2019  Started on CVVD on 7/2. Remains on CVVHD at -100. Breathing much better. Off Bipap. NE down to 5. CVP 12  Making minimal urine.   Co-ox 80%  Hgb 8.1    Objective:   Weight Range: 79 kg Body mass index is 28.11 kg/m.   Vital Signs:   Temp:  [97.5 F (36.4 C)-98.2 F (36.8 C)] 97.6 F (36.4 C) (07/04 1122) Pulse Rate:  [70-100] 96 (07/04 1100) Resp:  [10-21] 21 (07/04 1100) BP: (76-169)/(60-134) 103/77 (07/04 1100) SpO2:  [80 %-100 %] 95 % (07/04 1109) FiO2 (%):  [50 %-60 %] 50 % (07/04 0750) Weight:  [79 kg] 79 kg (07/04 0600) Last BM Date: 12/07/19  Weight change: Filed Weights   12/10/19 0500 12/11/19 0448 12/12/19 0600  Weight: 79.2 kg 79.4 kg 79 kg    Intake/Output:   Intake/Output Summary (Last 24 hours) at 12/12/2019 1138 Last data filed at 12/12/2019 1100 Gross per 24 hour  Intake 555.5 ml  Output 2212 ml  Net -1656.5 ml      Physical Exam   General:  Sitting up in bed on [place] No resp difficulty HEENT: normal Neck: supple. RIJ TLC. LIJ Trialysis Carotids 2+ bilat; no bruits. No lymphadenopathy or thryomegaly appreciated. Cor: PMI nondisplaced. Irregular  No rubs, gallops or murmurs. Lungs: coarse Abdomen: soft, nontender, nondistended. No hepatosplenomegaly. No bruits or masses. Good bowel sounds. Extremities: no cyanosis, clubbing, rash, 1+ edema + ecchymosis on R shoulder and r groin/flank (stable) Neuro: alert & orientedx3, cranial nerves grossly intact. moves all 4 extremities w/o difficulty. Affect pleasant  Telemetry    AF biv paced 90-100 Personally reviewed   Labs    CBC Recent Labs    12/11/19 0359 12/12/19 0510  WBC 11.4* 10.3  NEUTROABS 10.9* 9.6*  HGB 7.5* 8.1*  HCT 23.5* 26.1*  MCV 92.2 92.6  PLT 56* 58*   Basic Metabolic  Panel Recent Labs    12/11/19 0359 12/11/19 0359 12/11/19 1653 12/12/19 0510  NA 135   < > 135 136  K 4.4   < > 4.5 4.4  CL 96*   < > 101 102  CO2 25   < > 25 25  GLUCOSE 100*   < > 114* 110*  BUN 74*   < > 45* 29*  CREATININE 4.29*   < > 2.83* 2.09*  CALCIUM 8.0*   < > 7.9* 8.0*  MG 2.5*  --   --  2.5*  PHOS 3.6   < > 3.3 2.5   < > = values in this interval not displayed.   Liver Function Tests Recent Labs    12/11/19 1653 12/12/19 0510  ALBUMIN 2.1* 2.0*   No results for input(s): LIPASE, AMYLASE in the last 72 hours. Cardiac Enzymes No results for input(s): CKTOTAL, CKMB, CKMBINDEX, TROPONINI in the last 72 hours.  BNP: BNP (last 3 results) Recent Labs    11/17/2019 2002 11/29/19 1513  BNP 3,282.1* 2,503.4*    ProBNP (last 3 results) No results for input(s): PROBNP in the last 8760 hours.   D-Dimer No results for input(s): DDIMER in the last 72 hours. Hemoglobin A1C No results for input(s): HGBA1C in the last 72 hours. Fasting Lipid Panel No results for input(s): CHOL, HDL, LDLCALC, TRIG, CHOLHDL,  LDLDIRECT in the last 72 hours. Thyroid Function Tests No results for input(s): TSH, T4TOTAL, T3FREE, THYROIDAB in the last 72 hours.  Invalid input(s): FREET3  Other results:   Imaging    No results found.   Medications:     Scheduled Medications: . sodium chloride   Intravenous Once  . amiodarone  200 mg Oral BID  . arformoterol  15 mcg Nebulization BID  . atorvastatin  80 mg Oral q1800  . budesonide (PULMICORT) nebulizer solution  0.25 mg Nebulization BID  . Chlorhexidine Gluconate Cloth  6 each Topical Daily  . clopidogrel  75 mg Oral Daily  . [START ON 12/18/2019] darbepoetin (ARANESP) injection - NON-DIALYSIS  60 mcg Subcutaneous Q Sat-1800  . feeding supplement (ENSURE ENLIVE)  237 mL Oral BID BM  . guaiFENesin  600 mg Oral BID  . insulin aspart  0-9 Units Subcutaneous TID WC  . mouth rinse  15 mL Mouth Rinse BID  . multivitamin with  minerals  1 tablet Oral Daily  . nicotine  21 mg Transdermal Daily  . pantoprazole  40 mg Oral Daily  . revefenacin  175 mcg Nebulization Daily  . sodium chloride flush  10-40 mL Intracatheter Q12H  . sodium chloride flush  3 mL Intravenous Q12H  . tamsulosin  0.4 mg Oral QPC breakfast    Infusions: .  prismasol BGK 4/2.5 400 mL/hr at 12/11/19 2004  .  prismasol BGK 4/2.5 300 mL/hr at 12/12/19 0430  . sodium chloride    . ferumoxytol Stopped (12/09/19 0626)  . levofloxacin (LEVAQUIN) IV 500 mg (12/12/19 1106)  . norepinephrine (LEVOPHED) Adult infusion 4 mcg/min (12/12/19 1100)  . prismasol BGK 4/2.5 1,800 mL/hr at 12/12/19 0435  . sodium phosphate  Dextrose 5% IVPB 43 mL/hr at 12/12/19 1100    PRN Medications: sodium chloride, acetaminophen **OR** acetaminophen, levalbuterol, melatonin, ondansetron (ZOFRAN) IV, sodium chloride flush, sodium chloride flush   Assessment/Plan   1. Acute on chronic systolic CHF with prominent RV dysfunction: Has MDT CRT-P device.  Most recent echo with EF 40-45% with severely dilated RV with moderate systolic dysfunction.  RV function looks worse than pre-op but was not normal previously.  Hs-TnI not significantly elevated so doubt RV infarct/closure of RCA stent.  Suspect RV dysfunction is due to history of severe COPD.   - Milrinone stopped 7/1. On NE 5 for BP support with CVVHD. Would continue NE to keep SBP > 100 to maximize renal perfusion to optimize chance of renal recovery (please do NOT wean) - CO-OX 80% - Volume overloaded. Pulling -100 with CVVHD 2. AKI on CKD stage 3: Baseline creatinine 2.  - Oliguric - CVVHD started on 7/2 . Now pulling 100/hr - Suspect multifactorial => cardiorenal, contrast, hypotension/ATN.   - He is clear that he would NOT want long-term HD. Hopefully will see some renal recovery. - nephrology following  - Would continue NE to keep SBP > 100 to maximize renal perfusion to optimize chance of renal recovery (please do  NOT wean) 3. CAD: s/p PCI to RCA in 2/21. As above, HS-TnI not significantly elevated, no evidence for RV infarct.  - Denies s/s of angina - Continue Plavix, statin.  4. Atrial fibrillation, paroxysmal: A-paced on admit, now in underlying afib with BiV pacing.  -  On po amio. Rate controlled - Currently holding anticoagulation with anemia/concern for bleeding.  5. Anemia, acute blood loss: required transfusion.  - Got 1u RBCs again yesterday.   - Hgb 8.1 -> 7.5 ->  8.1 today. Anemia panel c/w IDA. Received feraheme 7/1 - No source of bleeding found yet.  No RP hematoma on CT.  - Likely needs aranesp 6. Thrombocytopenia: Plts starting to trend up, 46>>50 -> 56 -> 58K. Suspect due to critical illness.  No bleeding  7. Severe AS: S/p TAVR. Bioprosthetic aortic valve stable on post-op echo.  8. COPD with acute on chronic respiratory failure: Severe, recently quit smoking.   - Improved with volume removal. Off BIPAP today 9. ID: Concern for HCAP. Bilateral airspace disease on CXR.  - Sputum culture + for stenotrophamonas and serratia. AF - PCT 0.85  WBC down to 10.3 - PCCM managing abx. Per PCCM, recommend continuing levofloxacin regardless of steno ceftazidime susceptibility.   CRITICAL CARE Performed by: Glori Bickers  Total critical care time: 35 minutes  Critical care time was exclusive of separately billable procedures and treating other patients.  Critical care was necessary to treat or prevent imminent or life-threatening deterioration.  Critical care was time spent personally by me (independent of midlevel providers or residents) on the following activities: development of treatment plan with patient and/or surrogate as well as nursing, discussions with consultants, evaluation of patient's response to treatment, examination of patient, obtaining history from patient or surrogate, ordering and performing treatments and interventions, ordering and review of laboratory studies,  ordering and review of radiographic studies, pulse oximetry and re-evaluation of patient's condition.  Length of Stay: [place], MD  12/12/2019, 11:38 AM  Advanced Heart Failure Team Pager [phone] (M-F; 7a - 4p)  Please contact [facility] for night-coverage after hours (4p -7a ) and weekends on [email or web address]

## 2019-12-12 NOTE — Progress Notes (Signed)
[facility] Progress Note  Name: Justin Boone MRN: [id number] DOB: 27-Oct-1950  Subjective:  Seen and examined on CRRT; procedure supervised.  BP 110/65 and HR 89. left IJ vascath.  He had 2.6 liters UF with CRRT over 7/3 per charting.  Goal has been net neg 100/hr.  He also had 100 mL urine and one unmeasured void for 7/3 charted.  Levo has been reduced to 5 mcg/min.  Has been on CPAP  Review of systems:   Reports shortness of breath is quite a bit better Denies chest pain  Denies n/v Wants a sprite ------------- Background on consult:  Justin Boone is a 69 y.o. male.  Admitted 6/7 /2021 to [facility] and transferred to [facility] 11/22/2019 history significant for atrial fibrillation, bradycardia status post [employer] CRT-P, congestive heart failure with systolic dysfunction, hypertension and severe aortic stenosis.  History of coronary artery disease status post PCI history of tobacco abuse hyperlipidemia, gout, COPD and chronic kidney disease stage III.  As from epic records it seems that serum creatinine is between 2 and 2.5 mg/dL at baseline.  Underwent TAVR for severe aortic stenosis on 11/11/2019.  Course has been complicated by PNA and hypoxic resp failure.   Creatinine worsened after diuresis - charted as getting lasix 10 mg/hr 6/25 - 12/08/19; CVP was 4 per CHF.  and he did get vanc 6/26.  Urinalysis was negative for cellular elements positive for 100 mg/dL protein   Intake/Output Summary (Last 24 hours) at 12/12/2019 0612 Last data filed at 12/12/2019 0600 Gross per 24 hour  Intake 681.26 ml  Output 2892 ml  Net -2210.74 ml    Vitals:  Vitals:   12/12/19 0515 12/12/19 0530 12/12/19 0545 12/12/19 0600  BP: 117/72 127/71 117/72   Pulse: 83 70 94   Resp: (!) 21 14 16    Temp:      TempSrc:      SpO2: 95% 93% 91%   Weight:    79 kg  Height:         Physical Exam:     General adult male in bed on CPAP HEENT normocephalic atraumatic  extraocular movements intact sclera anicteric Neck supple trachea midline Lungs rare rhonchi; on cpap; comfortable Heart S1S2 no rub Abdomen soft nontender nondistended Extremities trace edema lower ext; comp stockings Psych normal mood and affect Neuro - alert and oriented x 3 provides hx and follows commands Access left IJ nontunneled catheter  Medications reviewed    Labs:  BMP Latest Ref Rng & Units 12/12/2019 12/11/2019 12/11/2019  Glucose 70 - 99 mg/dL 110(H) 114(H) 100(H)  BUN 8 - 23 mg/dL 29(H) 45(H) 74(H)  Creatinine 0.61 - 1.24 mg/dL 2.09(H) 2.83(H) 4.29(H)  Sodium 135 - 145 mmol/L 136 135 135  Potassium 3.5 - 5.1 mmol/L 4.4 4.5 4.4  Chloride 98 - 111 mmol/L 102 101 96(L)  CO2 22 - 32 mmol/L 25 25 25   Calcium 8.9 - 10.3 mg/dL 8.0(L) 7.9(L) 8.0(L)    Assessment/Plan:  1.Acute kidney injury   - pre-renal and ischemic insults/ATN.  CT without hydronephrosis or nephrolithiasis although there are small bilateral renal exophytic lesions that may represent cysts.  Urinalysis is bland with no evidence of any activity.  Clinically, this seems to be in the setting of IV contrast use with TAVR 11/15/2019, hemodynamic instability and intravascular volume depletion.  There was administration of IV vancomycin 12/04/2019. CCM placed vascath 7/2.   ------- - Continue CRRT - 4K fluids.  Net neg 13ml /  hr as tolerated and anticipate can reduce or transition to iHD soon if hemodynamics tolerate - Patient has stated he would not want long-term HD but was agreeable for initiation of RRT here in ICU for AKI and understands we do not know how long he will need; reassess goals per trends - d/c'd cymbalta for now with renal dysfunction  - supplement phos today, 7/5  2. Hypotension -has congestive heart failure.  Systolic dysfunction with an EF of 40-45% 12/01/2019.  Status post TAVR complicated by hypotension and hemodynamic instability.  On levo per critical care  3. Acute hypoxic resp failure - on  supplemental oxygen.  With PNA as well.  Optimize volume with RRT as able  4.  Acute on chronic congestive heart failure with RV dysfunction - optimize volume as able  5.  CAD status post PCI to RCA 07/2019  6.  Anemia normocytic - s/p feraheme on 7/1 and is ordered for additional dose 7/8. PRBC's on 7/3. Got aranesp 40 mcg on 7/3.  Reordered for aranesp 60 mcg weekly on Saturdays - may need to titrate up if needed  7.  Atrial fibrillation with biventricular pacing IV amiodarone  8.  COPD recently quit smoking. Decreased respiratory reserve at baseline  9.  Serratia and stenotrophomonas pneumonia. Antibiotics per primary team   10. Thrombocytopenia - preceded CRRT.  Stable and improved.  Suspected due to critical illness per CHF. No heparin with CRRT  Claudia Desanctis, MD 12/12/2019 6:32 AM

## 2019-12-12 NOTE — Progress Notes (Addendum)
NAME:  Justin Boone, MRN:  [id number], DOB:  1951-03-11, LOS: 55 ADMISSION DATE:  11/26/2019, CONSULTATION DATE:  12/02/2010 REFERRING MD:  Dr. Sallyanne Kuster , CHIEF COMPLAINT:  Hypotension and hypoxia  Brief History   Patient is a 69 year old male who transferred from outside facility for consultation of cardiology and nephrology.  Underwent TAVR due to severe aortic stenosis on 6/22.  1 days following TAVR patient seen with presyncopal episodes.  On 6/24 patient again seen with presyncopal episode and development of hypoxia with persistent hypotension necessitating transfer to ICU and consult to PCCM  Past Medical History  Chronic atrial fibrillation anticoagulated on Coumadin Bradycardia status post pacemaker placement Systolic congestive heart failure Type 2 diabetes Hypertension Severe aortic stenosis now status post TAVR Chronic kidney disease stage III Gout COPD  Daily tobacco abuse Obstructive sleep apnea with nocturnal CPAP, reports compliance Hyperlipidemia CAD status post PCI  Significant Hospital Events   6/12 Admitted from outside facility 6/27:Had episode of desaturations requiring BIPAP 6/30 On HHF and Bipap at night. O2 requirements slowly improving. Lasix drip stopped 7/1 Renal consulted for worsening AKI  Consults:  Cardiology Nephrology PCCM Cardiothoracic surgery Dental medicine  Procedures:  Dental extraction of teeth 11, 13, and 14 6/16 TAVR 6/21  Significant Diagnostic Tests:  6/23 echo: LVEF 40-45% RV mildly reduced, RVSP 44  6/24 ct abd/pelvis: 1. No acute intra-abdominal or pelvic pathology. No intraperitoneal or retroperitoneal fluid collection or hematoma. 2. Partially visualized bilateral pleural effusions with findings concerning for multifocal pneumonia. Clinical correlation and follow-up to resolution recommended. 3. Diffuse subcutaneous edema and anasarca. 4. Aortic Atherosclerosis (ICD10-I70.0).  Micro Data:  Covid 6/12 >  negative Surgical PCR 6/16 > negative MRSA PCR screening 6/21 > negative Sputum 6/26>> steno, serratia  Antimicrobials:  Cefuroxime 6/22->6/24 cefepime 6/26->6/27 vanc 6/26 levaquin 6/28->  Interim history/subjective:   Continuing CVVHD. Remains on BIPAP at night. Awake more comfortable this morning   Objective   Blood pressure 105/61, pulse 85, temperature (!) 97.5 F (36.4 C), temperature source Oral, resp. rate 19, height 5\' 6"  (1.676 m), weight 79 kg, SpO2 100 %. CVP:  [10 mmHg-15 mmHg] 10 mmHg  FiO2 (%):  [50 %-60 %] 50 % On 6 L/min HFNC  Intake/Output Summary (Last 24 hours) at 12/12/2019 0857 Last data filed at 12/12/2019 0700 Gross per 24 hour  Intake 512.52 ml  Output 2624 ml  Net -2111.48 ml   Filed Weights   12/10/19 0500 12/11/19 0448 12/12/19 0600  Weight: 79.2 kg 79.4 kg 79 kg    Examination:  General: elderly male, resting in bed, remains on pressors and Prn bipap  HENT: Tracking, following commands  Cardiovascular:  irregular, s1 s2  Pulmonary : no crackles no wheeze  Abdominal: soft, nt nd  Ext: no edema  Skin: no rash  Neuro: alert, following commands   Labs reviewed:  Lab Results  Component Value Date   WBC 10.3 12/12/2019   HGB 8.1 (L) 12/12/2019   HCT 26.1 (L) 12/12/2019   MCV 92.6 12/12/2019   PLT 58 (L) 12/12/2019     Resolved Hospital Problem list     Assessment & Plan:   Acute on chronic hypoxemic respiratory failure requiring BiPAP support as needed COPD, respiratory failure Serratia and stenotrophomonas pneumonia, resolved P: BIPAP PRN  [place] during day  Wean as tolerated SPO2 goal > 90% IS Mobility  Up in chair today  Started scheduled nebs, ICS/LABA.Darrick Meigs prn SABA   Acute on chronic systolic heart failure, LV dysfunction Severe  AS s/p TAVR - off pressors today is goal   Acute on chronic kidney injury: CVVHD per nephrology   Prolonged qt:  Tele   Thrombocytopenia Coagulopathy with elevated INR Last dose Coumadin  12/01/2019  Severe mscular deconditioning-  Needs PT/OT  Up in chair today of possible   Anemia, flank bruising-  Observe   Best practice:  Diet: Cardiac Pain/Anxiety/Delirium protocol (if indicated): As needed VAP protocol (if indicated): Not applicable DVT prophylaxis: SCDs GI prophylaxis: PPI Glucose control: SSI Mobility: Bedrest Code Status: Full code Family Communication: Per primary Disposition: ICU   PCCM will sign off. Please call with any questions.  Garner Nash, DO [facility] 12/12/2019 9:22 AM

## 2019-12-13 ENCOUNTER — Inpatient Hospital Stay (HOSPITAL_COMMUNITY): Payer: Medicare HMO

## 2019-12-13 DIAGNOSIS — I469 Cardiac arrest, cause unspecified: Secondary | ICD-10-CM

## 2019-12-13 DIAGNOSIS — I4901 Ventricular fibrillation: Secondary | ICD-10-CM

## 2019-12-13 LAB — RENAL FUNCTION PANEL
Albumin: 1.9 g/dL — ABNORMAL LOW (ref 3.5–5.0)
Anion gap: 7 (ref 5–15)
BUN: 15 mg/dL (ref 8–23)
CO2: 26 mmol/L (ref 22–32)
Calcium: 7.8 mg/dL — ABNORMAL LOW (ref 8.9–10.3)
Chloride: 103 mmol/L (ref 98–111)
Creatinine, Ser: 1.42 mg/dL — ABNORMAL HIGH (ref 0.61–1.24)
GFR calc Af Amer: 59 mL/min — ABNORMAL LOW (ref >60)
GFR calc non Af Amer: 51 mL/min — ABNORMAL LOW (ref >60)
Glucose, Bld: 106 mg/dL — ABNORMAL HIGH (ref 70–99)
Phosphorus: 1.6 mg/dL — ABNORMAL LOW (ref 2.5–4.6)
Potassium: 4.3 mmol/L (ref 3.5–5.1)
Sodium: 136 mmol/L (ref 135–145)

## 2019-12-13 LAB — CBC
HCT: 25.9 % — ABNORMAL LOW (ref 39.0–52.0)
HCT: 27.6 % — ABNORMAL LOW (ref 39.0–52.0)
Hemoglobin: 8.1 g/dL — ABNORMAL LOW (ref 13.0–17.0)
Hemoglobin: 8.3 g/dL — ABNORMAL LOW (ref 13.0–17.0)
MCH: 29.2 pg (ref 26.0–34.0)
MCH: 28.9 pg (ref 26.0–34.0)
MCHC: 31.3 g/dL (ref 30.0–36.0)
MCHC: 30.1 g/dL (ref 30.0–36.0)
MCV: 93.5 fL (ref 80.0–100.0)
MCV: 96.2 fL (ref 80.0–100.0)
Platelets: 50 10*3/uL — ABNORMAL LOW (ref 150–400)
Platelets: 52 10*3/uL — ABNORMAL LOW (ref 150–400)
RBC: 2.77 MIL/uL — ABNORMAL LOW (ref 4.22–5.81)
RBC: 2.87 MIL/uL — ABNORMAL LOW (ref 4.22–5.81)
RDW: 17.6 % — ABNORMAL HIGH (ref 11.5–15.5)
RDW: 18 % — ABNORMAL HIGH (ref 11.5–15.5)
WBC: 9.5 10*3/uL (ref 4.0–10.5)
WBC: 8.9 10*3/uL (ref 4.0–10.5)
nRBC: 0.2 % (ref 0.0–0.2)
nRBC: 1 % — ABNORMAL HIGH (ref 0.0–0.2)

## 2019-12-13 LAB — COMPREHENSIVE METABOLIC PANEL
ALT: 99 U/L — ABNORMAL HIGH (ref 0–44)
ALT: 101 U/L — ABNORMAL HIGH (ref 0–44)
AST: 97 U/L — ABNORMAL HIGH (ref 15–41)
AST: 100 U/L — ABNORMAL HIGH (ref 15–41)
Albumin: 1.8 g/dL — ABNORMAL LOW (ref 3.5–5.0)
Albumin: 1.9 g/dL — ABNORMAL LOW (ref 3.5–5.0)
Alkaline Phosphatase: 154 U/L — ABNORMAL HIGH (ref 38–126)
Alkaline Phosphatase: 167 U/L — ABNORMAL HIGH (ref 38–126)
Anion gap: 7 (ref 5–15)
Anion gap: 9 (ref 5–15)
BUN: 18 mg/dL (ref 8–23)
BUN: 20 mg/dL (ref 8–23)
CO2: 26 mmol/L (ref 22–32)
CO2: 24 mmol/L (ref 22–32)
Calcium: 7.6 mg/dL — ABNORMAL LOW (ref 8.9–10.3)
Calcium: 7.7 mg/dL — ABNORMAL LOW (ref 8.9–10.3)
Chloride: 101 mmol/L (ref 98–111)
Chloride: 100 mmol/L (ref 98–111)
Creatinine, Ser: 1.81 mg/dL — ABNORMAL HIGH (ref 0.61–1.24)
Creatinine, Ser: 2.04 mg/dL — ABNORMAL HIGH (ref 0.61–1.24)
GFR calc Af Amer: 44 mL/min — ABNORMAL LOW (ref >60)
GFR calc Af Amer: 38 mL/min — ABNORMAL LOW (ref >60)
GFR calc non Af Amer: 38 mL/min — ABNORMAL LOW (ref >60)
GFR calc non Af Amer: 33 mL/min — ABNORMAL LOW (ref >60)
Glucose, Bld: 110 mg/dL — ABNORMAL HIGH (ref 70–99)
Glucose, Bld: 114 mg/dL — ABNORMAL HIGH (ref 70–99)
Potassium: 4.3 mmol/L (ref 3.5–5.1)
Potassium: 5.4 mmol/L — ABNORMAL HIGH (ref 3.5–5.1)
Sodium: 134 mmol/L — ABNORMAL LOW (ref 135–145)
Sodium: 133 mmol/L — ABNORMAL LOW (ref 135–145)
Total Bilirubin: 2.3 mg/dL — ABNORMAL HIGH (ref 0.3–1.2)
Total Bilirubin: 2.2 mg/dL — ABNORMAL HIGH (ref 0.3–1.2)
Total Protein: 4.6 g/dL — ABNORMAL LOW (ref 6.5–8.1)
Total Protein: 4.6 g/dL — ABNORMAL LOW (ref 6.5–8.1)

## 2019-12-13 LAB — CBC WITH DIFFERENTIAL/PLATELET
Abs Immature Granulocytes: 0.08 10*3/uL — ABNORMAL HIGH (ref 0.00–0.07)
Basophils Absolute: 0 10*3/uL (ref 0.0–0.1)
Basophils Relative: 0 %
Eosinophils Absolute: 0.1 10*3/uL (ref 0.0–0.5)
Eosinophils Relative: 1 %
HCT: 26.3 % — ABNORMAL LOW (ref 39.0–52.0)
Hemoglobin: 8 g/dL — ABNORMAL LOW (ref 13.0–17.0)
Immature Granulocytes: 1 %
Lymphocytes Relative: 3 %
Lymphs Abs: 0.3 10*3/uL — ABNORMAL LOW (ref 0.7–4.0)
MCH: 28.3 pg (ref 26.0–34.0)
MCHC: 30.4 g/dL (ref 30.0–36.0)
MCV: 92.9 fL (ref 80.0–100.0)
Monocytes Absolute: 0.4 10*3/uL (ref 0.1–1.0)
Monocytes Relative: 4 %
Neutro Abs: 9.1 10*3/uL — ABNORMAL HIGH (ref 1.7–7.7)
Neutrophils Relative %: 91 %
Platelets: 55 10*3/uL — ABNORMAL LOW (ref 150–400)
RBC: 2.83 MIL/uL — ABNORMAL LOW (ref 4.22–5.81)
RDW: 17.5 % — ABNORMAL HIGH (ref 11.5–15.5)
WBC: 10 10*3/uL (ref 4.0–10.5)
nRBC: 0 % (ref 0.0–0.2)

## 2019-12-13 LAB — POCT I-STAT 7, (LYTES, BLD GAS, ICA,H+H)
Acid-base deficit: 2 mmol/L (ref 0.0–2.0)
Bicarbonate: 25.1 mmol/L (ref 20.0–28.0)
Calcium, Ion: 1.1 mmol/L — ABNORMAL LOW (ref 1.15–1.40)
HCT: 26 % — ABNORMAL LOW (ref 39.0–52.0)
Hemoglobin: 8.8 g/dL — ABNORMAL LOW (ref 13.0–17.0)
O2 Saturation: 71 %
Patient temperature: 97.8
Potassium: 4.3 mmol/L (ref 3.5–5.1)
Sodium: 137 mmol/L (ref 135–145)
TCO2: 27 mmol/L (ref 22–32)
pCO2 arterial: 51.6 mmHg — ABNORMAL HIGH (ref 32.0–48.0)
pH, Arterial: 7.292 — ABNORMAL LOW (ref 7.350–7.450)
pO2, Arterial: 41 mmHg — ABNORMAL LOW (ref 83.0–108.0)

## 2019-12-13 LAB — GLUCOSE, CAPILLARY
Glucose-Capillary: 134 mg/dL — ABNORMAL HIGH (ref 70–99)
Glucose-Capillary: 123 mg/dL — ABNORMAL HIGH (ref 70–99)
Glucose-Capillary: 136 mg/dL — ABNORMAL HIGH (ref 70–99)

## 2019-12-13 LAB — PROTIME-INR
INR: 1.5 — ABNORMAL HIGH (ref 0.8–1.2)
Prothrombin Time: 17.1 seconds — ABNORMAL HIGH (ref 11.4–15.2)

## 2019-12-13 LAB — COOXEMETRY PANEL
Carboxyhemoglobin: 2.5 % — ABNORMAL HIGH (ref 0.5–1.5)
Methemoglobin: 1.1 % (ref 0.0–1.5)
O2 Saturation: 75 %
Total hemoglobin: 8.5 g/dL — ABNORMAL LOW (ref 12.0–16.0)

## 2019-12-13 LAB — TROPONIN I (HIGH SENSITIVITY): Troponin I (High Sensitivity): 233 ng/L (ref <18)

## 2019-12-13 LAB — MAGNESIUM: Magnesium: 2.3 mg/dL (ref 1.7–2.4)

## 2019-12-13 MED ORDER — EPINEPHRINE HCL 5 MG/250ML IV SOLN IN NS
0.5000 ug/min | INTRAVENOUS | Status: DC
  Administered 2019-12-13: 40 ug/min via INTRAVENOUS
  Administered 2019-12-13: 5.333 ug/min via INTRAVENOUS
  Filled 2019-12-13 (×2): qty 250

## 2019-12-13 MED ORDER — EPINEPHRINE 1 MG/10ML IJ SOSY: 1.0000 | PREFILLED_SYRINGE | Freq: Once | INTRAMUSCULAR | Status: DC

## 2019-12-13 MED ORDER — TRAMADOL HCL 50 MG PO TABS
50.0000 mg | ORAL_TABLET | Freq: Two times a day (BID) | ORAL | Status: DC | PRN
  Administered 2019-12-13: 50 mg via ORAL
  Filled 2019-12-13: qty 1

## 2019-12-13 MED ORDER — EPINEPHRINE 1 MG/10ML IJ SOSY
1.0000 mg | PREFILLED_SYRINGE | Freq: Once | INTRAMUSCULAR | Status: AC
  Administered 2019-12-13: 1 mg via INTRAVENOUS

## 2019-12-13 MED ORDER — SODIUM CHLORIDE 0.9 % IV BOLUS
500.0000 mL | Freq: Once | INTRAVENOUS | Status: AC
  Administered 2019-12-13: 500 mL via INTRAVENOUS

## 2019-12-13 MED ORDER — OXYCODONE HCL 5 MG PO TABS
5.0000 mg | ORAL_TABLET | ORAL | Status: DC | PRN
  Administered 2019-12-13: 5 mg via ORAL
  Filled 2019-12-13: qty 1

## 2019-12-13 MED ORDER — SODIUM CHLORIDE 0.9 % IV BOLUS
250.0000 mL | Freq: Once | INTRAVENOUS | Status: AC
  Administered 2019-12-13: 250 mL via INTRAVENOUS

## 2019-12-13 MED ORDER — EPINEPHRINE HCL 5 MG/250ML IV SOLN IN NS
INTRAVENOUS | Status: AC
  Administered 2019-12-13: 10 ug/min via INTRAVENOUS
  Filled 2019-12-13: qty 250

## 2019-12-13 MED ORDER — FENTANYL CITRATE (PF) 100 MCG/2ML IJ SOLN
50.0000 ug | INTRAMUSCULAR | Status: DC | PRN
  Administered 2019-12-13 (×2): 50 ug via INTRAVENOUS
  Filled 2019-12-13 (×2): qty 2

## 2019-12-13 MED ORDER — VASOPRESSIN 20 UNIT/ML IV SOLN
0.0300 [IU]/min | INTRAVENOUS | Status: DC
  Administered 2019-12-13: 0.03 [IU]/min via INTRAVENOUS
  Filled 2019-12-13: qty 2

## 2019-12-13 MED ORDER — SODIUM BICARBONATE 8.4 % IV SOLN
INTRAVENOUS | Status: AC
  Filled 2019-12-13: qty 50

## 2019-12-13 MED FILL — Medication: Qty: 1 | Status: AC

## 2019-12-13 NOTE — Progress Notes (Signed)
OT Cancellation Note  Patient Details Name: Justin Boone MRN: [id number] DOB: [birth year]   Cancelled Treatment:    Reason Eval/Treat Not Completed: Patient not medically ready (for full therapy session). Since last seen by PT on 12/09/19 pt has started on CRRT with BP 87/60 and HR 135 as of 8:00 am this morning. Spoke with PT and they will check on him for appropriateness of exercises only today and they will see him if appropriate.  [place], OTR/L Acute Rehab Services Pager [phone] Office [phone]    Almon Register 12/13/2019, 8:39 AM

## 2019-12-13 NOTE — Progress Notes (Signed)
Paged Dr. Massie Bougie and MD called back. Made MD aware that patient's MAP is 45-55. Maxxed on EPI and LEVO drip. Asked MD about adding another pressor. MD stated he would call patient's son who is on the way here from [place]. MD called back and stated that son would like [place] to attempt to maintain BP while he is on the way here. MD to order vaso drip. MD stated to maintain MAP of 55 is goal at this time.

## 2019-12-13 NOTE — Procedures (Signed)
Intubation Procedure Note  Justin Boone  [id number]  19-Aug-1950  Date:12/13/19  Time:7:57 PM   Provider Performing:Derian Dimalanta L Tamala Julian, BS, RRT, RCP   Procedure: Intubation (31500)  Indication(s) Respiratory Insufficiency, Unresponsive  Consent Unable to obtain consent due to emergent nature of procedure.   Time Out Verified patient identification, verified procedure, site/side was marked, verified correct patient position, special equipment/implants available, medications/allergies/relevant history reviewed, required imaging and test results available. Intubation was emergent in the setting of patient altered mental status and unresponsiveness.    Sterile Technique Usual hand hygeine, masks, and gloves were used   Procedure Description On arrival CODE BLUE was actively performed and ACLS protocol was followed. Patient positioned in bed supine. Patient was intubated with 7.5 endotracheal tube using a MAC 4 under Direct Laryngoscopy secured at 24 lip.  Airway Difficulty was unanticipated.  Vocal Cords were clearly visualized. No airway difficulty noted during the intubation procedure. Grade 1 view noted with clear view of glottis and larynx.  Colorimetric CO2 detector was noted and consistent with tracheal placement. BBS to auscultation were equal.     Complications/Tolerance None; patient tolerated the procedure well. Chest X-ray is pending.  Justin Boone L. [name], RRT, RCP 12/13/19, 2017

## 2019-12-13 NOTE — Progress Notes (Signed)
Called to see patient after a PEA respiratory arrest--further complicated by VF; requiring intubation and (very high) dose pressors.  Even still he is hypoxic and hypotensive.  Called and reviewed with Dr DM; no further interventions at this point.  No clear value for CVVH as no metabolic acidosis and BP too low even if CVP were elevated.  He was not anticoagulated and so at risk for PE - in the setting of his RV failure that could have been a trigger but his anemia precludes anticoagulation; I dont think he would be a filter candidate BP precludes ECMO.  The resident has called and spoken to his son who is enroute. Hopefully his father will survive until then; he is now a DNR   Time spent 2013-2058

## 2019-12-13 NOTE — Progress Notes (Signed)
Dr. Mariane Masters at bedside and gave order to titrate Epi drip up to 40 mcg/min.

## 2019-12-13 NOTE — Progress Notes (Signed)
Physical Therapy Treatment Patient Details Name: Justin Boone MRN: [id number] DOB: 1950/07/24 Today's Date: 12/13/2019    History of Present Illness Pt is a 69 year old male with history of A. fib on Coumadin, CAD, bradycardia s/p  Medtronic pacer, DM 2, HTN, severe AS, COPD, CKD stage IIIb is admitted to [facility] as a hospital to hospital transfer from [place] for cardiology evaluation. s/p right-sided thoracentesis for symptomatic right-sided pleural effusion at OSH. 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 Started on CVVHD 7/2 (off at least temporarily 7/5)    PT Comments    Pt much weaker since last visit. CRRT was initiated since last visit. Feel at this level pt will need post acute rehab and recommend CIR.    Follow Up Recommendations  CIR     Equipment Recommendations  Rolling walker with 5" wheels;Other (comment) (vs rollator)    Recommendations for Other Services Rehab consult     Precautions / Restrictions Precautions Precautions: Fall Precaution Comments: watch 02, knee buckling Restrictions Weight Bearing Restrictions: No    Mobility  Bed Mobility Overal bed mobility: Needs Assistance Bed Mobility: Supine to Sit     Supine to sit: Min guard;HOB elevated (increased time)     General bed mobility comments: Pt up on EOB with OT  Transfers Overall transfer level: Needs assistance   Transfers: Sit to/from Stand Sit to Stand: Mod assist;+2 physical assistance Stand pivot transfers:  (use of sara stedy (due to pt's knees buckling in standing))       General transfer comment: Assist to bring hips up and for balance. Used Stedy for bed to chair  Ambulation/Gait                 Marine scientist [place] (Stroke Patients Only)       Balance Overall balance assessment: Needs assistance Sitting-balance support: No upper extremity supported;Feet supported Sitting  balance-Leahy Scale: Good     Standing balance support: Bilateral upper extremity supported Standing balance-Leahy Scale: Poor Standing balance comment: Stood x 4 minutes for pericare with Stedy and min assist initially and progressing to mod assist with fatigue.                             Cognition Arousal/Alertness: Awake/alert Behavior During Therapy: WFL for tasks assessed/performed Overall Cognitive Status: Within Functional Limits for tasks assessed                                        Exercises      General Comments General comments (skin integrity, edema, etc.): Pt on 10L O2 HFNC at rest and incr to 15L with activity.      Pertinent Vitals/Pain Pain Assessment: Faces Faces Pain Scale: Hurts little more Pain Location: right arm (when asked to raise it as far as he could)--alot of bruising Pain Descriptors / Indicators: Discomfort;Sore Pain Intervention(s): Limited activity within patient's tolerance;Monitored during session    Home Living                      Prior Function            PT Goals (current goals can now be found in the care plan section) Acute Rehab PT  Goals Patient Stated Goal: to go home Progress towards PT goals: Not progressing toward goals - comment    Frequency    Min 3X/week      PT Plan Discharge plan needs to be updated    Co-evaluation PT/OT/SLP Co-Evaluation/Treatment: Yes Reason for Co-Treatment: For patient/therapist safety PT goals addressed during session: Mobility/safety with mobility        AM-PAC PT "6 Clicks" Mobility   Outcome Measure  Help needed turning from your back to your side while in a flat bed without using bedrails?: A Little Help needed moving from lying on your back to sitting on the side of a flat bed without using bedrails?: A Little Help needed moving to and from a bed to a chair (including a wheelchair)?: Total Help needed standing up from a chair using your  arms (e.g., wheelchair or bedside chair)?: A Lot Help needed to walk in hospital room?: Total Help needed climbing 3-5 steps with a railing? : Total 6 Click Score: 11    End of Session Equipment Utilized During Treatment: Gait belt;Oxygen Activity Tolerance: Patient limited by fatigue Patient left: in chair;with call bell/phone within reach Nurse Communication: Mobility status;Need for lift equipment PT Visit Diagnosis: Muscle weakness (generalized) (M62.81);Other abnormalities of gait and mobility (R26.89)     Time: 9480-1655 PT Time Calculation (min) (ACUTE ONLY): 23 min  Charges:  $Therapeutic Activity: 8-22 mins                     [place] Pager [phone] Office [place] 12/13/2019, 1:56 PM

## 2019-12-13 NOTE — Progress Notes (Signed)
At 1932 went to bedside after [facility] RN called and asked about patient's 02 SATs. Patient unresponsive, agonal, V-paced on monitor but unable to palpate pulse therefore CPR started and code blue called. ACLS protocol followed. Wells Guiles, RN charge nurse at bedside along with Linton Rump, RT and multiple other team members.  See Code Blue sheet. Dr. Mariane Masters and Dr. Myrtie Hawk at the bedside.

## 2019-12-13 NOTE — Progress Notes (Signed)
Patient's son has arrived and is at bedside with pastor. All questions answered and listened to son and offered support.

## 2019-12-13 NOTE — Progress Notes (Signed)
Paged Dr. Massie Bougie to make him aware that patient's son is at bedside.

## 2019-12-13 NOTE — Progress Notes (Signed)
Occupational Therapy Treatment Patient Details Name: Justin Boone MRN: [id number] DOB: 06-09-1951 Today's Date: 12/13/2019    History of present illness Pt is a 69 year old male with history of A. fib on Coumadin, CAD, bradycardia s/p  Medtronic pacer, DM 2, HTN, severe AS, COPD, CKD stage IIIb is admitted to [facility] as a hospital to hospital transfer from [place] for cardiology evaluation. s/p right-sided thoracentesis for symptomatic right-sided pleural effusion at OSH. 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 Started on CVVHD 7/2 (off at least temporarily 7/5)   OT comments  This 69 yo male admitted and underwent above, not making progress today due to having to start on CVVHD 12/10/2019 and is now weaker. He was min guard A to get to EOB with HOB up, but then +2 Mod A for sit<>stand with use of sara stedy and then needed sara stedy to be transferred to recliner due to knees buckling earlier this AM and genearlized deconditioning. He is very motivated to do all that is asked of him and was very appreciative of being able to get to EOB and OOB to recliner today. O2 had to be turned up from 10 liters to 15 with activity. Due to decline in function post acute recommendation is now changed to CIR.   Follow Up Recommendations  CIR;Supervision/Assistance - 24 hour    Equipment Recommendations  3 in 1 bedside commode       Precautions / Restrictions Precautions Precautions: Fall Precaution Comments: watch 02, knee buckling Restrictions Weight Bearing Restrictions: No       Mobility Bed Mobility Overal bed mobility: Needs Assistance Bed Mobility: Supine to Sit     Supine to sit: Min guard;HOB elevated (increased time)     General bed mobility comments: Pt was able to use Bil UEs to pull himself into long sitting in bed with use of bed rails  Transfers     Transfers: Sit to/from Stand Sit to Stand: Mod assist;+2 physical assistance Stand pivot  transfers:  (use of sara stedy (due to pt's knees buckling in standing))       General transfer comment: cues for hand placement with guarding for stability and assist for lines    Balance Overall balance assessment: Needs assistance Sitting-balance support: No upper extremity supported;Feet supported Sitting balance-Leahy Scale: Good     Standing balance support: Bilateral upper extremity supported Standing balance-Leahy Scale: Poor Standing balance comment: sara stedy for support in addition to therapist support                           ADL either performed or assessed with clinical judgement   ADL Overall ADL's : Needs assistance/impaired                     Lower Body Dressing: Moderate assistance Lower Body Dressing Details (indicate cue type and reason): +2 sit<>stand with use of sara stedy, unable to get to his feet today to doff/donn socks Toilet Transfer: Moderate assistance;+2 for physical assistance Toilet Transfer Details (indicate cue type and reason): sara stedy, bed>recliner Toileting- Clothing Manipulation and Hygiene: Total assistance Toileting - Clothing Manipulation Details (indicate cue type and reason): Mod A +2 sit<>stand with sara stedy with pt standing ~2 minutes to get cleaned up at back peri area             Vision Baseline Vision/History: Wears glasses Patient Visual Report: No change from baseline  Cognition Arousal/Alertness: Awake/alert Behavior During Therapy: WFL for tasks assessed/performed Overall Cognitive Status: Within Functional Limits for tasks assessed                                                     Pertinent Vitals/ Pain       Pain Assessment: Faces Faces Pain Scale: Hurts little more Pain Location: right arm (when asked to raise it as far as he could)--alot of bruising Pain Descriptors / Indicators: Discomfort;Sore Pain Intervention(s): Limited activity within  patient's tolerance;Monitored during session         Frequency  Min 3X/week        Progress Toward Goals  OT Goals(current goals can now be found in the care plan section)  Progress towards OT goals: Not progressing toward goals - comment  Acute Rehab OT Goals Patient Stated Goal: to go home OT Goal Formulation: With patient Time For Goal Achievement: 12/17/19 Potential to Achieve Goals: Good  Plan Discharge plan needs to be updated    Co-evaluation    PT/OT/SLP Co-Evaluation/Treatment: Yes Reason for Co-Treatment: For patient/therapist safety          AM-PAC OT "6 Clicks" Daily Activity     Outcome Measure   Help from another person eating meals?: None Help from another person taking care of personal grooming?: A Little Help from another person toileting, which includes using toliet, bedpan, or urinal?: Total Help from another person bathing (including washing, rinsing, drying)?: A Lot Help from another person to put on and taking off regular upper body clothing?: A Little Help from another person to put on and taking off regular lower body clothing?: A Lot 6 Click Score: 15    End of Session Equipment Utilized During Treatment: Gait belt;Oxygen (15 liters)  OT Visit Diagnosis: Unsteadiness on feet (R26.81);Other abnormalities of gait and mobility (R26.89);Muscle weakness (generalized) (M62.81);Pain Pain - Right/Left: Right Pain - part of body: Shoulder;Arm   Activity Tolerance Patient limited by fatigue   Patient Left in chair;with call bell/phone within reach   Nurse Communication Mobility status;Need for lift equipment (sara stedy v. maxi sky)        Time: 1157-2620 OT Time Calculation (min): 27 min  Charges: OT General Charges $OT Visit: 1 Visit OT Treatments $Self Care/Home Management : 8-22 mins  Justin Boone, OTR/L Acute [employer] Pager [phone] Office [phone]     Justin Boone 12/13/2019, 11:47 AM

## 2019-12-13 NOTE — Code Documentation (Signed)
CODE BLUE NOTE  Patient Name: Justin Boone   MRN: [id number]   Date of Birth/ Sex: [birth year] , male      Admission Date: 11/19/2019  Attending Provider: Sanda Klein, MD  Primary Diagnosis: S/P TAVR (transcatheter aortic valve replacement)    Indication: Pt was in his usual state of health until this PM, when he was noted to be unresponsive and in pulsesless electrical activity. Code blue was subsequently called. At the time of arrival on scene, ACLS protocol was underway.    Technical Description:  - CPR performance duration:  16 minutes  - Was defibrillation or cardioversion used? Yes  - Was external pacer placed? No  - Was patient intubated pre/post CPR? Yes    Medications Administered: Y = Yes; Blank = No Amiodarone  Y  Atropine  N  Calcium  N  Epinephrine Y  Lidocaine  N  Magnesium  N  Norepinephrine  Y  Phenylephrine  N  Sodium bicarbonate  Y  Vasopressin  N    Post CPR evaluation:  - Final Status - Was patient successfully resuscitated ? Yes - What is current rhythm? Ventricular tachycardia - What is current hemodynamic status? Cautious    Miscellaneous Information:  - Labs sent, including: CBC, CMP, troponin  - Primary team notified?  Yes  - Family Notified? Yes  - Additional notes/ transfer status: Patient was not transferred.        Donney Dice, DO  12/13/2019, 8:23 PM

## 2019-12-13 NOTE — Progress Notes (Signed)
Dr. Myrtie Hawk calling patient's son to update him about arrest. Dr. Mariane Masters at the bedside attempting to place arterial line and giving orders for Epi drip, epi push and NS bolus.

## 2019-12-13 NOTE — Progress Notes (Signed)
Chaplain responded to Code Blue. No family bedside present. Chaplain checked Facesheet and received instructions from nurse to contact son, Bronc Brosseau, who had recently spoken with nurse.  The son informed the chaplain that he was 3 hours away from hospital but making effort to make it back to the hospital asap.  Son [name] also said he wished to be continued to be informed of father's condition. Son [name] says he has HCPOA. Chaplain will continue to be available tonight.  Rev. Tamsen Snider Pager [phone]

## 2019-12-13 NOTE — Progress Notes (Signed)
Dr. Caryl Comes at bedside and was updated on code blue.

## 2019-12-13 NOTE — Progress Notes (Signed)
Inpatient Rehab Admissions Coordinator Note:   Per therapy recommendations, pt was screened for CIR candidacy by Shann Medal, PT, DPT.  At this time we are recommending CIR consult.  Please place a consult order if pt would like to be considered.  Please contact me with questions.   Shann Medal, PT, DPT [phone] 12/13/19 2:42 PM

## 2019-12-13 NOTE — Progress Notes (Signed)
Dr. Massie Bougie at bedside speaking to son.

## 2019-12-13 NOTE — Progress Notes (Signed)
I called patient's son, Mr. Falcon Mccaskey. He was already aware that his father coded. He is coming to the hospital.  I explained that Mr. Brigante is now intubated after the code, although we have his heart back but per PCCM attending his situation is not well and unfortunately we do not think he will surviuve another code. He remained hypotensive and requiring pressor. [name] is aware of patient kidney injury and that he required CRRT. After we dicussed his wishes and his situation, he decided no to perform another CPR if patient coded again so we change his code status to DNR (but since he is already intubated, we will keep him intubated) Will continue current management and keep him intubated but no more CPR and ADLS. If another code, [name] wants [place] to let his father goes as he understands his situation.  He is about 2h and 30 min away.  He would like to stay with his father tonight when arriving here. I asked the nurse and she will talk to the charge nurse about the possibility.  Linna Hoff, MD IM- PGY3 Code blue resident

## 2019-12-13 NOTE — Procedures (Signed)
ARTERIAL LINE Catheter Insertion Procedure Note  Justin Boone  [id number]  1950-08-06  Date:12/13/19  Time:8:53 PM    Provider Performing: Leigh Aurora, BS, RRT, RCP   Procedure: Insertion of Arterial Line [phone]) without US guidance  Indication(s) Blood pressure monitoring and/or need for frequent ABGs  Consent Unable to obtain consent due to emergent nature of procedure.   Time Out Verified patient identification, verified procedure, site/side was marked, verified correct patient position, special equipment/implants available, medications/allergies/relevant history reviewed, required imaging and test results available.   Sterile Technique Maximal sterile technique was not able to be achieved due to emergent nature of procedure.   Procedure Description Area of catheter insertion was cleaned with chlorhexidine and draped in sterile fashion. Without real-time ultrasound guidance an arterial catheter was placed into the right radial artery.  Appropriate arterial tracings confirmed on monitor.  Line draws back and flushes w/o complications.   Complications/Tolerance None; patient tolerated the procedure well.   Justin Boone L. [name], RRT, RCP 12/13/19, 2055

## 2019-12-13 NOTE — Progress Notes (Signed)
Pastor at bedside that patient's son sent.

## 2019-12-13 NOTE — Progress Notes (Signed)
NAME:  Justin Boone, MRN:  [id number], DOB:  01-Sep-1950, LOS: 74 ADMISSION DATE:  11/26/2019, CONSULTATION DATE:  12/02/2010 REFERRING MD:  Dr. Sallyanne Kuster , CHIEF COMPLAINT:  Hypotension and hypoxia  Brief History   Patient is a 69 year old male who transferred from outside facility for consultation of cardiology and nephrology.  Underwent TAVR due to severe aortic stenosis on 6/22.  1 days following TAVR patient seen with presyncopal episodes.  On 6/24 patient again seen with presyncopal episode and development of hypoxia with persistent hypotension necessitating transfer to ICU and consult to PCCM  Past Medical History  Chronic atrial fibrillation anticoagulated on Coumadin Bradycardia status post pacemaker placement Systolic congestive heart failure Type 2 diabetes Hypertension Severe aortic stenosis now status post TAVR Chronic kidney disease stage III Gout COPD  Daily tobacco abuse Obstructive sleep apnea with nocturnal CPAP, reports compliance Hyperlipidemia CAD status post PCI  Significant Hospital Events   6/12 Admitted from outside facility 6/27:Had episode of desaturations requiring BIPAP 6/30 On HHF and Bipap at night. O2 requirements slowly improving. Lasix drip stopped 7/1 Renal consulted for worsening AKI  Consults:  Cardiology Nephrology PCCM Cardiothoracic surgery Dental medicine  Procedures:  Dental extraction of teeth 11, 13, and 14 6/16 TAVR 6/21  Significant Diagnostic Tests:  6/23 echo: LVEF 40-45% RV mildly reduced, RVSP 44  6/24 ct abd/pelvis: 1. No acute intra-abdominal or pelvic pathology. No intraperitoneal or retroperitoneal fluid collection or hematoma. 2. Partially visualized bilateral pleural effusions with findings concerning for multifocal pneumonia. Clinical correlation and follow-up to resolution recommended. 3. Diffuse subcutaneous edema and anasarca. 4. Aortic Atherosclerosis (ICD10-I70.0).  Micro Data:  Covid 6/12 >  negative Surgical PCR 6/16 > negative MRSA PCR screening 6/21 > negative Sputum 6/26>> steno, serratia  Antimicrobials:  Cefuroxime 6/22->6/24 cefepime 6/26->6/27 vanc 6/26 levaquin 6/28->  Interim history/subjective:   Still with high o2 requirements. Cards asked [place] to see again   Objective   Blood pressure (!) 87/60, pulse (!) 135, temperature (!) 97.4 F (36.3 C), temperature source Oral, resp. rate (!) 21, height 5\' 6"  (1.676 m), weight 78.9 kg, SpO2 98 %. CVP:  [12 mmHg-26 mmHg] 15 mmHg  FiO2 (%):  [50 %-70 %] 55 % On 6 L/min HFNC  Intake/Output Summary (Last 24 hours) at 12/13/2019 0934 Last data filed at 12/13/2019 0900 Gross per 24 hour  Intake 856.2 ml  Output 3039 ml  Net -2182.8 ml   Filed Weights   12/11/19 0448 12/12/19 0600 12/13/19 0500  Weight: 79.4 kg 79 kg 78.9 kg    Examination:  General appearance: 69 y.o., male, NAD, conversant  Eyes: anicteric sclerae, moist conjunctivae; no lid-lag; PERRLA, tracking appropriately HENT: NCAT; oropharynx,  Neck: Trachea midline;  Lungs: diminished BL, no crackles or wheeze  CV: RRR, S1, S2, no MRGs  Abdomen: Soft, non-tender; non-distended, BS present  Extremities: No peripheral edema Skin: Normal temperature, turgor and texture; no rash Psych: Appropriate affect Neuro: Alert and oriented to person and place, no focal deficit     Labs reviewed:  Lab Results  Component Value Date   WBC 10.0 12/13/2019   HGB 8.0 (L) 12/13/2019   HCT 26.3 (L) 12/13/2019   MCV 92.9 12/13/2019   PLT 55 (L) 12/13/2019     Resolved Hospital Problem list     Assessment & Plan:   Acute on chronic hypoxemic respiratory failure requiring BiPAP support as needed COPD, respiratory failure Serratia and stenotrophomonas pneumonia, resolved Persistent bilateral infiltrates, difficult to wean from O2 requirements  P: HRCT chest  Possible stop amio  Discussed with cardiology  BIPAP prn  Weaning from HFNC  SpO2 goal >88%  IS   Mobility  Up in chair  Scheduled nebs, ICS/LABA/LAMA prn SABA  Acute on chronic systolic heart failure, LV dysfunction Severe AS s/p TAVR - weaning from low dose NEPI  Acute on chronic kidney injury: CVVHD per nephrology   Prolonged qt:  Observe on tele   Thrombocytopenia Coagulopathy with elevated INR Observe   Severe mscular deconditioning-  Pt/ot  Need to see him up in chair   Anemia, flank bruising-  obs   Best practice:  Diet: Cardiac Pain/Anxiety/Delirium protocol (if indicated): As needed VAP protocol (if indicated): Not applicable DVT prophylaxis: SCDs GI prophylaxis: PPI Glucose control: SSI Mobility: Bedrest Code Status: Full code Family Communication: Per primary Disposition: ICU   Garner Nash, DO [facility] 12/13/2019 9:34 AM

## 2019-12-13 NOTE — Progress Notes (Signed)
Patient ID: Justin Boone, male   DOB: 06/17/1950, 69 y.o.   MRN: [id number]     Advanced Heart Failure Rounding Note  PCP-Cardiologist: Sherren Mocha, MD   Subjective:    Underwent TAVR 11/10/2019  Started on CVVD on 7/2. Remains on CVVHD at -100. Breathing much better subjectively but still on 10L [place]. NE down to 4. CVP 6.   Making minimal urine, 50 cc/24 hrs.    Co-ox 75%  Hgb 8, stable.    Objective:   Weight Range: 78.9 kg Body mass index is 28.08 kg/m.   Vital Signs:   Temp:  [97.3 F (36.3 C)-97.8 F (36.6 C)] 97.4 F (36.3 C) (07/05 0751) Pulse Rate:  [34-135] 135 (07/05 0800) Resp:  [13-25] 21 (07/05 0800) BP: (66-131)/(42-91) 87/60 (07/05 0800) SpO2:  [73 %-99 %] 98 % (07/05 0800) FiO2 (%):  [50 %-70 %] 55 % (07/05 0317) Weight:  [78.9 kg] 78.9 kg (07/05 0500) Last BM Date: 12/07/19  Weight change: Filed Weights   12/11/19 0448 12/12/19 0600 12/13/19 0500  Weight: 79.4 kg 79 kg 78.9 kg    Intake/Output:   Intake/Output Summary (Last 24 hours) at 12/13/2019 0855 Last data filed at 12/13/2019 0800 Gross per 24 hour  Intake 893.97 ml  Output 3002 ml  Net -2108.03 ml      Physical Exam   General: NAD Neck: No JVD, no thyromegaly or thyroid nodule.  Lungs: Rhonchi bilaterally.  CV: Nondisplaced PMI.  Heart regular S1/S2, no S3/S4, no murmur.  No peripheral edema.   Abdomen: Soft, nontender, no hepatosplenomegaly, no distention.  Skin: Intact without lesions or rashes.  Neurologic: Alert and oriented x 3.  Psych: Normal affect. Extremities: No clubbing or cyanosis.  HEENT: Normal.    Telemetry    AF biv paced 90s Personally reviewed   Labs    CBC Recent Labs    12/12/19 0510 12/13/19 0507  WBC 10.3 10.0  NEUTROABS 9.6* 9.1*  HGB 8.1* 8.0*  HCT 26.1* 26.3*  MCV 92.6 92.9  PLT 58* 55*   Basic Metabolic Panel Recent Labs    12/12/19 0510 12/12/19 0510 12/12/19 1950 12/13/19 0507  NA 136   < > 135 136  K 4.4   < > 4.1 4.3  CL 102    < > 101 103  CO2 25   < > 25 26  GLUCOSE 110*   < > 82 106*  BUN 29*   < > 19 15  CREATININE 2.09*   < > 1.57* 1.42*  CALCIUM 8.0*   < > 7.7* 7.8*  MG 2.5*  --   --  2.3  PHOS 2.5   < > 1.9* 1.6*   < > = values in this interval not displayed.   Liver Function Tests Recent Labs    12/12/19 1950 12/13/19 0507  ALBUMIN 2.0* 1.9*   No results for input(s): LIPASE, AMYLASE in the last 72 hours. Cardiac Enzymes No results for input(s): CKTOTAL, CKMB, CKMBINDEX, TROPONINI in the last 72 hours.  BNP: BNP (last 3 results) Recent Labs    11/11/2019 2002 11/29/19 1513  BNP 3,282.1* 2,503.4*    ProBNP (last 3 results) No results for input(s): PROBNP in the last 8760 hours.   D-Dimer No results for input(s): DDIMER in the last 72 hours. Hemoglobin A1C No results for input(s): HGBA1C in the last 72 hours. Fasting Lipid Panel No results for input(s): CHOL, HDL, LDLCALC, TRIG, CHOLHDL, LDLDIRECT in the last 72 hours. Thyroid Function Tests  No results for input(s): TSH, T4TOTAL, T3FREE, THYROIDAB in the last 72 hours.  Invalid input(s): FREET3  Other results:   Imaging    No results found.   Medications:     Scheduled Medications: . sodium chloride   Intravenous Once  . amiodarone  200 mg Oral BID  . arformoterol  15 mcg Nebulization BID  . atorvastatin  80 mg Oral q1800  . budesonide (PULMICORT) nebulizer solution  0.25 mg Nebulization BID  . Chlorhexidine Gluconate Cloth  6 each Topical Daily  . clopidogrel  75 mg Oral Daily  . [START ON 12/18/2019] darbepoetin (ARANESP) injection - NON-DIALYSIS  60 mcg Subcutaneous Q Sat-1800  . feeding supplement (ENSURE ENLIVE)  237 mL Oral BID BM  . guaiFENesin  600 mg Oral BID  . insulin aspart  0-9 Units Subcutaneous TID WC  . mouth rinse  15 mL Mouth Rinse BID  . multivitamin with minerals  1 tablet Oral Daily  . nicotine  21 mg Transdermal Daily  . pantoprazole  40 mg Oral Daily  . revefenacin  175 mcg Nebulization Daily   . sodium chloride flush  10-40 mL Intracatheter Q12H  . sodium chloride flush  3 mL Intravenous Q12H  . tamsulosin  0.4 mg Oral QPC breakfast    Infusions: .  prismasol BGK 4/2.5 400 mL/hr at 12/11/19 2004  .  prismasol BGK 4/2.5 300 mL/hr at 12/12/19 0430  . sodium chloride    . ferumoxytol Stopped (12/09/19 1245)  . norepinephrine (LEVOPHED) Adult infusion 4 mcg/min (12/13/19 0800)  . prismasol BGK 4/2.5 1,800 mL/hr at 12/13/19 0031    PRN Medications: sodium chloride, acetaminophen **OR** acetaminophen, levalbuterol, melatonin, ondansetron (ZOFRAN) IV, sodium chloride flush, sodium chloride flush   Assessment/Plan   1. Acute on chronic systolic CHF with prominent RV dysfunction: Has MDT CRT-P device.  Most recent echo with EF 40-45% with severely dilated RV with moderate systolic dysfunction.  RV function looks worse than pre-op but was not normal previously.  Hs-TnI not significantly elevated so doubt RV infarct/closure of RCA stent.  Suspect RV dysfunction is due to history of severe COPD.  Milrinone stopped 7/1. On NE 4 for BP support with CVVHD. Would continue NE to keep SBP > 100 to maximize renal perfusion to optimize chance of renal recovery. Co-ox 76%.  CVP 6 today.  At this point, looks near-euvolemic.  - Would run CVVH even for now with CVP 6 => will need to stop soon and follow for renal recovery.  2. AKI on CKD stage 3: Baseline creatinine 2.  CVVHD started on 7/2 . Now pulling 100/hr. Suspect multifactorial => cardiorenal, contrast, hypotension/ATN.   - He is clear that he would NOT want long-term HD. Hopefully will see some renal recovery. - Would continue NE to keep SBP > 100 to maximize renal perfusion to optimize chance of renal recovery - CVP 6, so can run CVVH even for now, will need trial off CVVH to look for renal recovery.  3. CAD: s/p PCI to RCA in 2/21. As above, HS-TnI not significantly elevated, no evidence for RV infarct. No chest pain.  - Continue Plavix,  statin.  4. Atrial fibrillation, paroxysmal: A-paced on admit, now in underlying afib with BiV pacing.  Has been on amiodarone.  Still has significant oxygen requirement, ?component of amiodarone lung toxicity.  - Will stop amiodarone for now since he remains in afib and we cannot cardiovert him off anticoagulation.  Will also get CT chest w/o contrast now  that he has been diuresed and PNA has been treated.  - Currently holding anticoagulation with anemia/concern for bleeding, hgb and plts still low.  5. Anemia, acute blood loss: required transfusion.  Hgb 8.1 -> 7.5 -> 8.1 -> 8 today. Anemia panel c/w IDA. Received feraheme 7/1.  No source of bleeding found yet.  No RP hematoma on CT.  - Likely needs aranesp 6. Thrombocytopenia: Plts low 49K -> 50 -> 56 -> 58K -> 55K. Suspect due to critical illness.   7. Severe AS: S/p TAVR. Bioprosthetic aortic valve stable on post-op echo.  8. COPD with acute on chronic respiratory failure: Severe, recently quit smoking.   - Still on 10L oxygen by [place] with CVP down to 6.  9. ID: Concern for HCAP. Bilateral airspace disease on CXR. Sputum culture + for stenotrophamonas and serratia. Afebrile now, WBCs 10.  - He has completed course of levofloxacin.    CRITICAL CARE Performed by: Loralie Champagne  Total critical care time: 40 minutes  Critical care time was exclusive of separately billable procedures and treating other patients.  Critical care was necessary to treat or prevent imminent or life-threatening deterioration.  Critical care was time spent personally by me (independent of midlevel providers or residents) on the following activities: development of treatment plan with patient and/or surrogate as well as nursing, discussions with consultants, evaluation of patient's response to treatment, examination of patient, obtaining history from patient or surrogate, ordering and performing treatments and interventions, ordering and review of laboratory studies,  ordering and review of radiographic studies, pulse oximetry and re-evaluation of patient's condition.  Length of Stay: [place], MD  12/13/2019, 8:55 AM  Advanced Heart Failure Team Pager [phone] (M-F; 7a - 4p)  Please contact [facility] for night-coverage after hours (4p -7a ) and weekends on [email or web address]

## 2019-12-13 NOTE — Progress Notes (Signed)
Patient ID: Justin Boone, male   DOB: 1950-08-31, 69 y.o.   MRN: [id number] S: Feels well, no complaints O:BP 103/71   Pulse (!) 101   Temp 98.2 F (36.8 C) (Oral)   Resp (!) 25   Ht 5\' 6"  (1.676 m)   Wt 78.9 kg   SpO2 95%   BMI 28.08 kg/m   Intake/Output Summary (Last 24 hours) at 12/13/2019 1315 Last data filed at 12/13/2019 1100 Gross per 24 hour  Intake 490.31 ml  Output 2406 ml  Net -1915.69 ml   Intake/Output: I/O last 3 completed shifts: In: 1088.1 [P.O.:540; I.V.:291.1; IV Piggyback:257] Out: 4370 [Urine:50; Other:4320]  Intake/Output this shift:  Total I/O In: 25.1 [I.V.:25.1] Out: 239 [Urine:5; Other:234] Weight change: -0.1 kg Gen: sitting in chair eating lunch, NAD CVS: tachy Resp: occ rhonchi Abd: +BS, soft, NT/ND Ext: no edema  Recent Labs  Lab 12/08/19 0533 12/08/19 0533 12/09/19 0445 12/09/19 0445 12/10/19 0618 12/10/19 2205 12/11/19 0359 12/11/19 1653 12/12/19 0510 12/12/19 1950 12/13/19 0507  NA 133*   < > 131*   < > 132* 131* 135 135 136 135 136  K 4.3   < > 4.3   < > 4.4 4.4 4.4 4.5 4.4 4.1 4.3  CL 93*   < > 93*  --  92*  --  96* 101 102 101 103  CO2 29   < > 26  --  25  --  25 25 25 25 26   GLUCOSE 104*   < > 106*  --  94  --  100* 114* 110* 82 106*  BUN 90*   < > 102*  --  107*  --  74* 45* 29* 19 15  CREATININE 4.13*   < > 5.12*  --  5.73*  --  4.29* 2.83* 2.09* 1.57* 1.42*  ALBUMIN  --   --   --   --  1.8*  --  2.1* 2.1* 2.0* 2.0* 1.9*  CALCIUM 8.1*   < > 8.0*  --  8.2*  --  8.0* 7.9* 8.0* 7.7* 7.8*  PHOS 4.1  --   --   --  4.6  --  3.6 3.3 2.5 1.9* 1.6*   < > = values in this interval not displayed.   Liver Function Tests: Recent Labs  Lab 12/12/19 0510 12/12/19 1950 12/13/19 0507  ALBUMIN 2.0* 2.0* 1.9*   No results for input(s): LIPASE, AMYLASE in the last 168 hours. No results for input(s): AMMONIA in the last 168 hours. CBC: Recent Labs  Lab 12/09/19 0445 12/09/19 0445 12/10/19 0406 12/10/19 2205 12/11/19 0359  12/12/19 0510 12/13/19 0507  WBC 12.1*   < > 11.9*  --  11.4* 10.3 10.0  NEUTROABS 11.4*   < > 11.3*  --  10.9* 9.6* 9.1*  HGB 7.7*   < > 8.1*   < > 7.5* 8.1* 8.0*  HCT 23.4*   < > 25.0*   < > 23.5* 26.1* 26.3*  MCV 88.6  --  91.2  --  92.2 92.6 92.9  PLT 46*   < > 50*  --  56* 58* 55*   < > = values in this interval not displayed.   Cardiac Enzymes: No results for input(s): CKTOTAL, CKMB, CKMBINDEX, TROPONINI in the last 168 hours. CBG: Recent Labs  Lab 12/11/19 2107 12/12/19 0643 12/12/19 1123 12/12/19 1517 12/13/19 1126  GLUCAP 124* 96 160* 153* 134*    Iron Studies: No results for input(s): IRON, TIBC, TRANSFERRIN, FERRITIN in  the last 72 hours. Studies/Results: CT Chest High Resolution  Result Date: 12/13/2019 CLINICAL DATA:  Acute on chronic respiratory failure, COPD, pneumonia EXAM: CT CHEST WITHOUT CONTRAST TECHNIQUE: Multidetector CT imaging of the chest was performed following the standard protocol without intravenous contrast. High resolution imaging of the lungs, as well as inspiratory and expiratory imaging, was performed. COMPARISON:  Chest radiographs, 12/10/2019, CT chest, 08/25/2018 FINDINGS: Cardiovascular: Aortic atherosclerosis. Aortic valve stent endograft. Mild cardiomegaly. Three-vessel coronary artery calcifications. No pericardial effusion. Enlargement of the main pulmonary artery up to 3.4 cm in caliber. Left neck multi lumen vascular catheter, right neck single lumen vascular catheter. Mediastinum/Nodes: No enlarged mediastinal, hilar, or axillary lymph nodes. Thyroid gland, trachea, and esophagus demonstrate no significant findings. Lungs/Pleura: Moderate right, small left pleural effusions and associated atelectasis or consolidation. There is a mild-to-moderate centrilobular emphysema with very extensive superimposed heterogeneous and ground-glass airspace opacity throughout. Upper Abdomen: No acute abnormality. Small volume ascites in the included upper  abdomen. Musculoskeletal: No chest wall mass or suspicious bone lesions identified. IMPRESSION: 1. Moderate right, small left pleural effusions and associated atelectasis or consolidation. 2. There is very extensive heterogeneous and ground-glass airspace opacity throughout, consistent with multifocal infection and/or edema. 3. Emphysema (ICD10-J43.9). 4. Mild cardiomegaly and coronary artery disease. 5. Aortic valve stent endograft. Aortic Atherosclerosis (ICD10-I70.0). 6. Enlargement of the main pulmonary artery, as can be seen in pulmonary hypertension. 7. Small volume ascites in the included upper abdomen. Electronically Signed   By: Eddie Candle M.D.   On: 12/13/2019 11:46   . sodium chloride   Intravenous Once  . arformoterol  15 mcg Nebulization BID  . atorvastatin  80 mg Oral q1800  . budesonide (PULMICORT) nebulizer solution  0.25 mg Nebulization BID  . Chlorhexidine Gluconate Cloth  6 each Topical Daily  . clopidogrel  75 mg Oral Daily  . [START ON 12/18/2019] darbepoetin (ARANESP) injection - NON-DIALYSIS  60 mcg Subcutaneous Q Sat-1800  . feeding supplement (ENSURE ENLIVE)  237 mL Oral BID BM  . guaiFENesin  600 mg Oral BID  . insulin aspart  0-9 Units Subcutaneous TID WC  . mouth rinse  15 mL Mouth Rinse BID  . multivitamin with minerals  1 tablet Oral Daily  . nicotine  21 mg Transdermal Daily  . pantoprazole  40 mg Oral Daily  . revefenacin  175 mcg Nebulization Daily  . sodium chloride flush  10-40 mL Intracatheter Q12H  . sodium chloride flush  3 mL Intravenous Q12H  . tamsulosin  0.4 mg Oral QPC breakfast    BMET    Component Value Date/Time   NA 136 12/13/2019 0507   K 4.3 12/13/2019 0507   CL 103 12/13/2019 0507   CO2 26 12/13/2019 0507   GLUCOSE 106 (H) 12/13/2019 0507   BUN 15 12/13/2019 0507   CREATININE 1.42 (H) 12/13/2019 0507   CALCIUM 7.8 (L) 12/13/2019 0507   GFRNONAA 51 (L) 12/13/2019 0507   GFRAA 59 (L) 12/13/2019 0507   CBC    Component Value  Date/Time   WBC 10.0 12/13/2019 0507   RBC 2.83 (L) 12/13/2019 0507   HGB 8.0 (L) 12/13/2019 0507   HCT 26.3 (L) 12/13/2019 0507   PLT 55 (L) 12/13/2019 0507   MCV 92.9 12/13/2019 0507   MCH 28.3 12/13/2019 0507   MCHC 30.4 12/13/2019 0507   RDW 17.5 (H) 12/13/2019 0507   LYMPHSABS 0.3 (L) 12/13/2019 0507   MONOABS 0.4 12/13/2019 0507   EOSABS 0.1 12/13/2019 0507  BASOSABS 0.0 12/13/2019 0507   Background on consult:  Justin Boone a 69 y.o.male.Admitted 6/7 /2021 to [place] and transferred to [facility] 11/12/2019 history significant for atrial fibrillation, bradycardia status post [employer] CRT-P, congestive heart failure with systolic dysfunction, hypertension and severe aortic stenosis. History of coronary artery disease status post PCI history of tobacco abuse hyperlipidemia, gout, COPD and chronic kidney disease stage III. As from epic records it seems that serum creatinine is between 2 and 2.5 mg/dL at baseline.  Underwent TAVR for severe aortic stenosis on 11/13/2019.  Course has been complicated by PNA and hypoxic resp failure.   Creatinine worsened after diuresis - charted as getting lasix 10 mg/hr 6/25 - 12/08/19; CVP was 4 per CHF.  and he did get vanc 6/26.  Urinalysis was negative for cellular elements positive for 100 mg/dL protein  Assessment/Plan:  1. AKI- pre-renal and ischemic insults following TAVR 11/26/2019 with hemodynamic instability and need for pressors as well as intravascular volume depletion.  S/p initiation of CRRT on 12/10/19.  Remains oliguric.  Off of CRRT due to CT scan.  CVP 6, will hold off on restarting CRRT and follow for any renal recovery. 1. Will likely resume CRRT in next 24-48 hours if no improvement of renal function. 2. May attempt IHD if BP remains stable off of pressors.  2. Acute on chronic systolic CHF with prominent RV dysfunction- per heart failure team. 3. CAD s/p PCI toRCA  4. Severe AS s/p TAVR 5. Acute on  chronic hypoxemic respiratory failure requiring bipap prn/COPD- per PCCM 6. ID- possible HCAP on levo per PCCM 7. Thrombocytopenia- likely due to critical illness.  Continue to follow. 8. Anemia/ABLA- transfuse prn. 9. Paroxysmal A fib- biV pacing per Cardiology  Donetta Potts, MD [facility] [phone]

## 2019-12-14 MED ORDER — SODIUM CHLORIDE 0.9 % IV SOLN: 250.0000 mL | INTRAVENOUS | Status: DC

## 2019-12-14 MED ORDER — GLYCOPYRROLATE 1 MG PO TABS
1.0000 mg | ORAL_TABLET | ORAL | Status: DC | PRN
  Filled 2019-12-14: qty 1

## 2019-12-14 MED ORDER — MORPHINE SULFATE (PF) 2 MG/ML IV SOLN
2.0000 mg | INTRAVENOUS | Status: DC | PRN
  Administered 2019-12-14: 2 mg via INTRAVENOUS
  Filled 2019-12-14: qty 2

## 2019-12-14 MED ORDER — GLYCOPYRROLATE 0.2 MG/ML IJ SOLN: 0.2000 mg | INTRAMUSCULAR | Status: DC | PRN

## 2019-12-14 MED ORDER — POLYVINYL ALCOHOL 1.4 % OP SOLN
1.0000 [drp] | Freq: Four times a day (QID) | OPHTHALMIC | Status: DC | PRN
  Filled 2019-12-14: qty 15

## 2019-12-14 MED ORDER — ACETAMINOPHEN 650 MG RE SUPP: 650.0000 mg | Freq: Four times a day (QID) | RECTAL | Status: DC | PRN

## 2019-12-14 MED ORDER — EPINEPHRINE PF 1 MG/ML IJ SOLN
0.5000 ug/min | INTRAVENOUS | Status: DC
  Administered 2019-12-14: 10.667 ug/min via INTRAVENOUS
  Administered 2019-12-14: 40 ug/min via INTRAVENOUS
  Filled 2019-12-14 (×2): qty 8

## 2019-12-14 MED ORDER — PHENYLEPHRINE CONCENTRATED 100MG/250ML (0.4 MG/ML) INFUSION SIMPLE: 0.0000 ug/min | INTRAVENOUS | Status: DC

## 2019-12-14 MED ORDER — ACETAMINOPHEN 325 MG PO TABS: 650.0000 mg | ORAL_TABLET | Freq: Four times a day (QID) | ORAL | Status: DC | PRN

## 2019-12-14 MED ORDER — DEXTROSE 5 % IV SOLN: INTRAVENOUS | Status: DC

## 2019-12-14 MED ORDER — PHENYLEPHRINE CONCENTRATED 100MG/250ML (0.4 MG/ML) INFUSION SIMPLE
0.0000 ug/min | INTRAVENOUS | Status: DC
  Administered 2019-12-14: 20 ug/min via INTRAVENOUS
  Filled 2019-12-14 (×2): qty 250

## 2019-12-14 MED ORDER — MIDAZOLAM HCL 2 MG/2ML IJ SOLN
2.0000 mg | INTRAMUSCULAR | Status: DC | PRN
  Administered 2019-12-14: 2 mg via INTRAVENOUS
  Filled 2019-12-14: qty 4

## 2019-12-27 ENCOUNTER — Encounter: Payer: Medicare HMO | Admitting: Cardiology

## 2019-12-28 ENCOUNTER — Encounter: Payer: Medicare HMO | Admitting: Internal Medicine

## 2019-12-30 ENCOUNTER — Other Ambulatory Visit (HOSPITAL_COMMUNITY): Payer: Medicare HMO

## 2019-12-30 ENCOUNTER — Encounter (HOSPITAL_COMMUNITY): Payer: Self-pay | Admitting: *Deleted

## 2019-12-30 ENCOUNTER — Ambulatory Visit: Payer: Medicare HMO | Admitting: Physician Assistant

## 2019-12-30 NOTE — Progress Notes (Signed)
Pt's death certificate completed and signed by Dr Aundra Dubin, [facility] is aware and request DC be mailed to [facility].

## 2020-01-06 ENCOUNTER — Ambulatory Visit: Payer: Medicare HMO | Admitting: Physician Assistant

## 2020-01-09 NOTE — Progress Notes (Signed)
Dr. Massie Bougie came by and spoke with RN and is aware of patient's time of death of 45.

## 2020-01-09 NOTE — Procedures (Signed)
Extubation Procedure Note  Patient Details:   Name: Justin Boone DOB: [birth year] MRN: [id number]   Airway Documentation:  Airway 7.5 mm (Active)  Secured at (cm) 24 cm 08-Jan-2020 0312  Measured From Lips January 08, 2020 [place] 08-Jan-2020 2446  Secured By [employer] 2020-01-08 0312  Tube Holder Repositioned Yes 01/08/2020 0312  Cuff Pressure (cm H2O) 25 cm H2O 12/13/19 2000  Site Condition Dry 01-08-20 0312   Vent end date: (not recorded) Vent end time: (not recorded)   Evaluation  O2 sats: currently acceptable Complications: No apparent complications Patient did not tolerate procedure well. Bilateral Breath Sounds:  (coarse throughout, rub)   No  Justin Boone 08-Jan-2020, 4:56 AM   Terminal extubation performed per family request.

## 2020-01-09 NOTE — Progress Notes (Signed)
Spoke with Dr. Massie Bougie about BP of 85/31 and MAP of 44 and asked if MD wanted to add another pressor? MD stated he would order neo.

## 2020-01-09 NOTE — Progress Notes (Signed)
On further discussion with family after escalating pressor requirements for pressors they have opted for the patient to be comfort measures.  They would like for compassionate extubation and keep the patient comfortable thereafter.  Comfort care order set has been placed with the assistance of pulmonary critical care.  Nursing has been made aware of these changes as well.

## 2020-01-09 NOTE — Progress Notes (Signed)
Remained at bedside with patient and family post ETT removal. No signs of distress after extubation. No waveform on A-line, pacer spikes remain on ECG but rate is 0.  No pulse palpated and no breathing noted. This RN and June Smith, RN each auscultated for heart tones for one full minute each and no heart tones heard. Time of death pronounced at 82.  This RN paged Dr. Massie Bougie via amion to make him aware of patient's death. Both son's and their pastor present at bedside. Condolences offered and hand prints made prior to removing ventilator.

## 2020-01-09 NOTE — Progress Notes (Signed)
Paged Dr. Massie Bougie to make him aware that patient's family has decided that they want to make patient comfort care and go with compassionate extubation.

## 2020-01-09 NOTE — Progress Notes (Signed)
Compassionate extubation to comfort care per MD order. Family stepped out during extubation and then immediately brought back in room and at bedside at this time. Versed and morphine given prior to removal of ET tube.

## 2020-01-09 DEATH — deceased

## 2020-02-09 NOTE — Death Summary Note (Signed)
  Advanced Heart Failure Death Summary  Death Summary   Patient ID: Justin Boone MRN: [id number], DOB/AGE: Nov 19, 1950 69 y.o. Admit date: 11/16/2019 D/C date:     01/12/2020   Primary Discharge Diagnoses:  Acute on Chronic Systolic Heart Failure w/ Prominent RV Dysfunction  Cardiogenic Shock PEA Arrest Ventricular Fibrillation  Acute on Chronic Hypoxic Respiratory Failure Requiring Intubation  HCAP COPD  Severe Aortic Stenosis, s/p TAVR AKI on Stage III CKD, Requiring CVVHD Anemia CAD s/p remote PCI  Atrial Fibrillation    Hospital Course:  69 y/o male w/ a  history of atrial flutter/fibrillation with tachy-brady syndrome with MDT CRT-P and CAD with PCI to RCA in 2/21, who was admitted to [facility] on 11/12/2019 with acute CHF exacerbation and found to have severe AS.  He had TAVR 12/03/2019.  Post-op course complicated by RV failure/ cardiogenic shock, hypotension requiring milrinone + NE, AKI on CKD stage 3 requiring CVVHD, acute hypoxic respiratory failure, HCAP as well as anemia requiring transfusion.   On 7/5, he had PEA respiratory arrest, further c/b VF, requiring ACLS, intubation and escalation of high dose pressor support. On 7/6, family wishes were to transition to comfort care. Terminal extubation was performed per family request on 7/6 and he expired shortly after. Time of death pronounced at 41.    Significant Diagnostic Studies  2D Echo 11/29/2019 1. TAVR procedure: a 29 mmEdwards-SAPIEN 3 valve was successfully placed in the aortic position with improvement of peak/mean transaortic gradients from 40/20 to 3/2 mmHg. There was only trivial paravalvular leak and no pericardial effusion. 2. Left ventricular ejection fraction, by estimation, is 40 to 45%. The left ventricle has mildly decreased function. The left ventricle demonstrates global hypokinesis. The left ventricular internal cavity size was mildly dilated. 3. Right ventricular systolic function is mildly reduced. The right  ventricular size is moderately enlarged. There is mildly elevated pulmonary artery systolic pressure. 4. Left atrial size was moderately dilated. 5. Right atrial size was mildly dilated. 6. Moderate mitral valve regurgitation. 7. Aortic valve regurgitation is moderate. Severe aortic valve stenosis. Aortic valve mean gradient measures 20.0 mmHg.  Consultations  Cardiology  Advanced Heart Failure Structural Heart/ CT Surgery  Oral Surgery  Pulmonary/ Critical Care Medicine  Nephrology      Duration of Discharge Encounter: Greater than 35 minutes   Signed, Lyda Jester, PA-C 01/07/2020, 4:37 PM

## 2021-09-15 IMAGING — CT CT KNEE*L* W/O CM
3 series · 13 of 33 positions shown, 16 images · non-contrast
Comparison: None.

CLINICAL DATA: Status post fall, question of tibial plateau
fracture

EXAM:
CT OF THE left  KNEE WITHOUT CONTRAST
TECHNIQUE: Multidetector CT imaging of the left knee was performed according to
the standard protocol. Multiplanar CT image reconstructions were
also generated.

[Series 4: extremity soft tissue · axial · 0.44mm/px · z∈[-1266,-1076]mm · 5 of 137 slices shown, 7 images]
[im 21/137  soft-tissue]
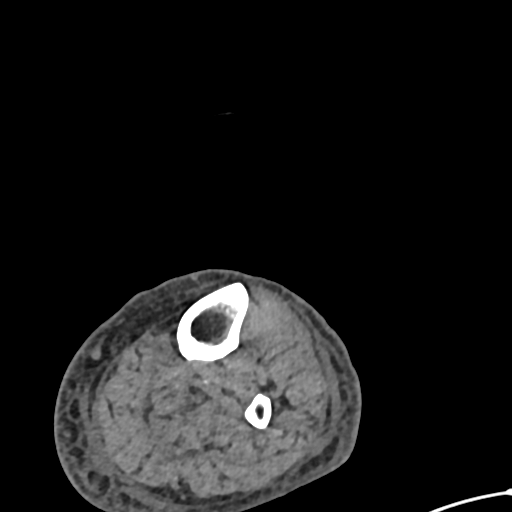
[im 21/137  bone]
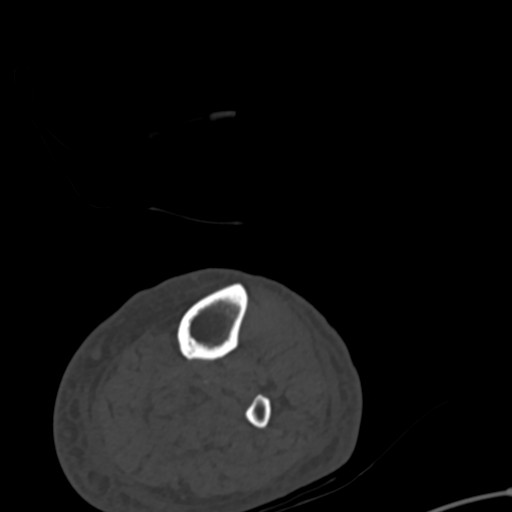
[im 42/137  bone]
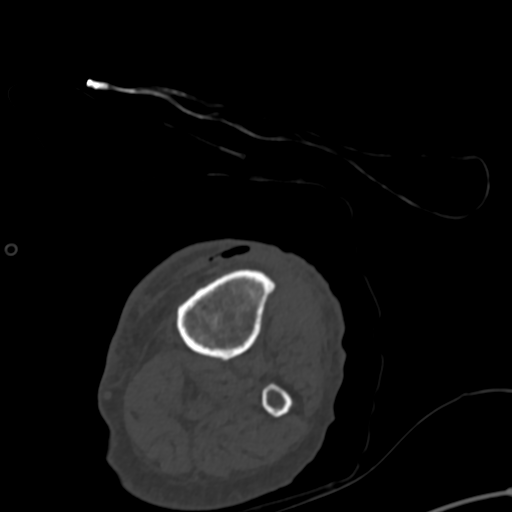
[im 74/137  bone]
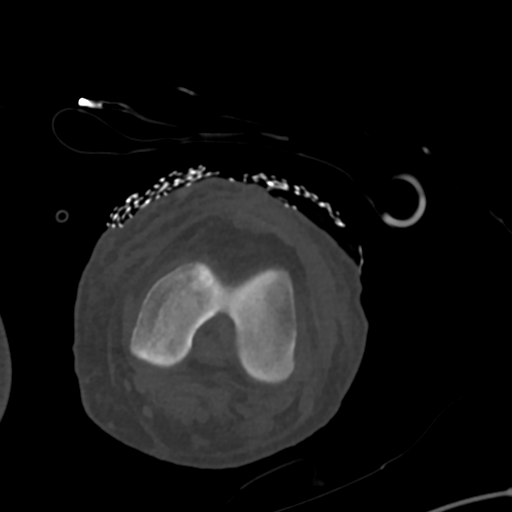
[im 95/137  bone]
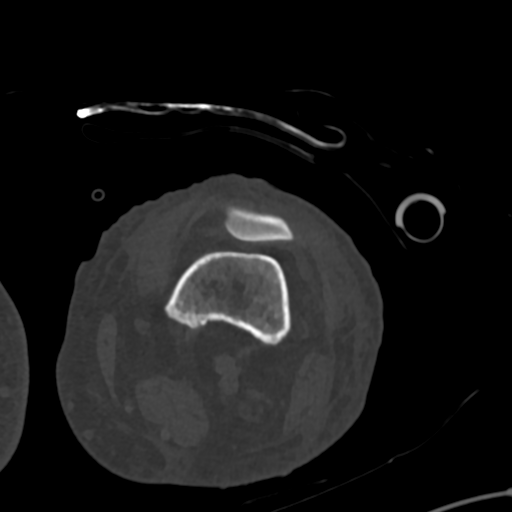
[im 116/137  soft-tissue]
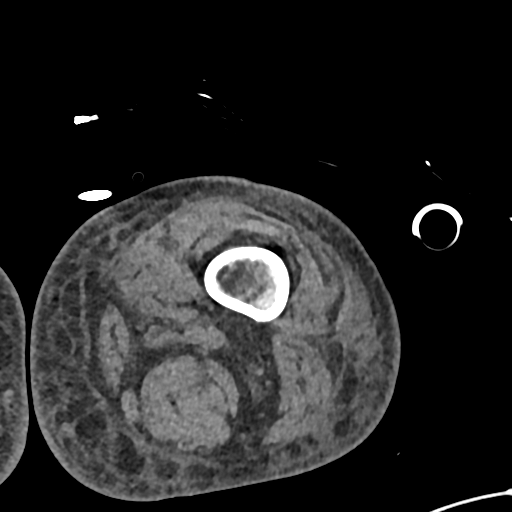
[im 116/137  bone]
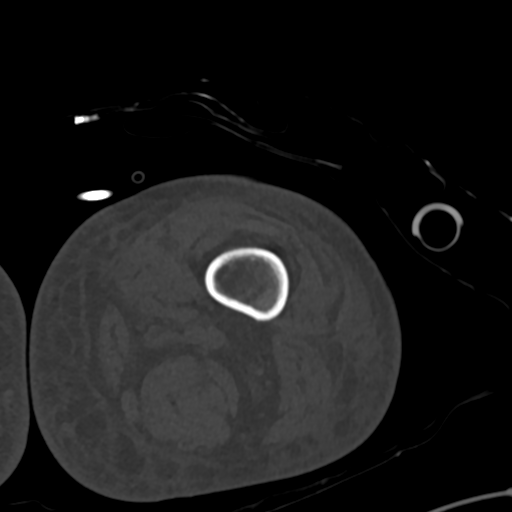

[Series 8: cor soft tissue · coronal · 0.42mm/px · 3 of 89 slices shown]
[im 18/89  bone]
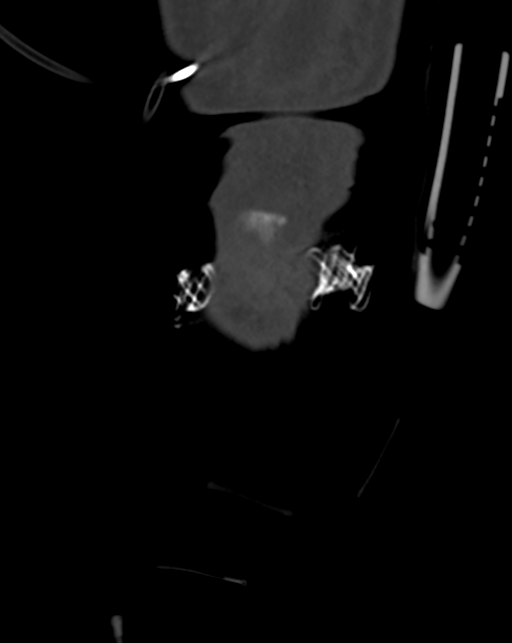
[im 36/89  bone]
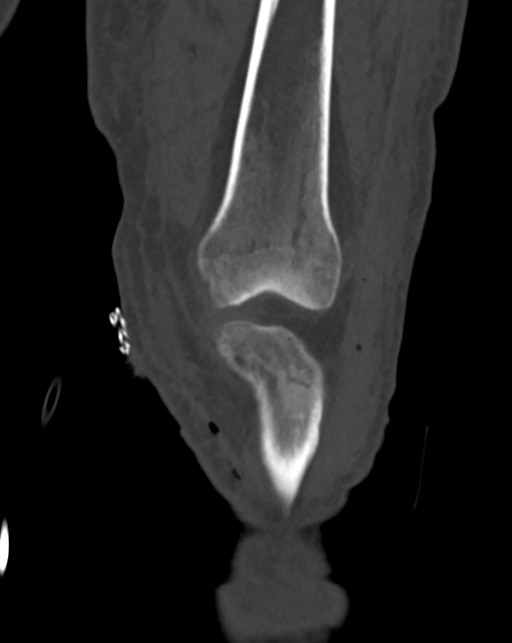
[im 53/89  bone]
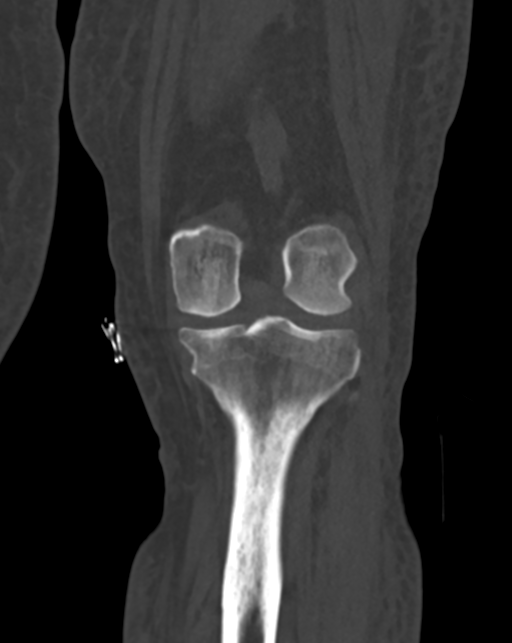

[Series 9: sag soft tissue · sagittal · 0.35mm/px · 5 of 100 slices shown, 6 images]
[im 34/100  bone]
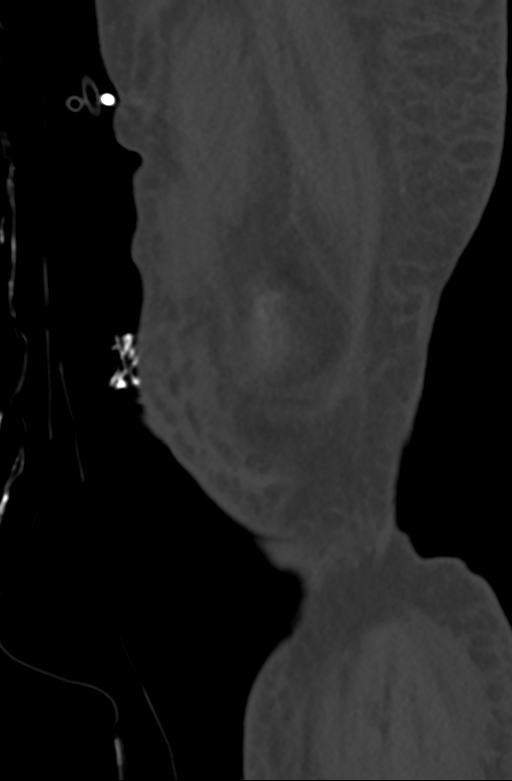
[im 42/100  bone]
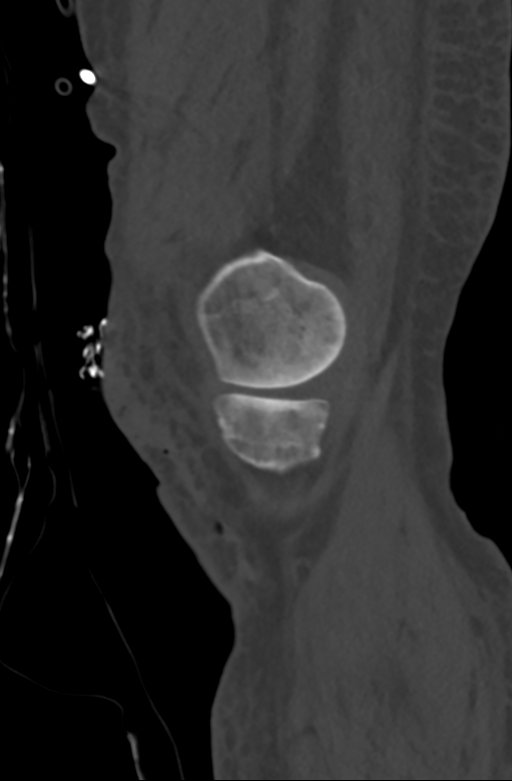
[im 50/100  soft-tissue]
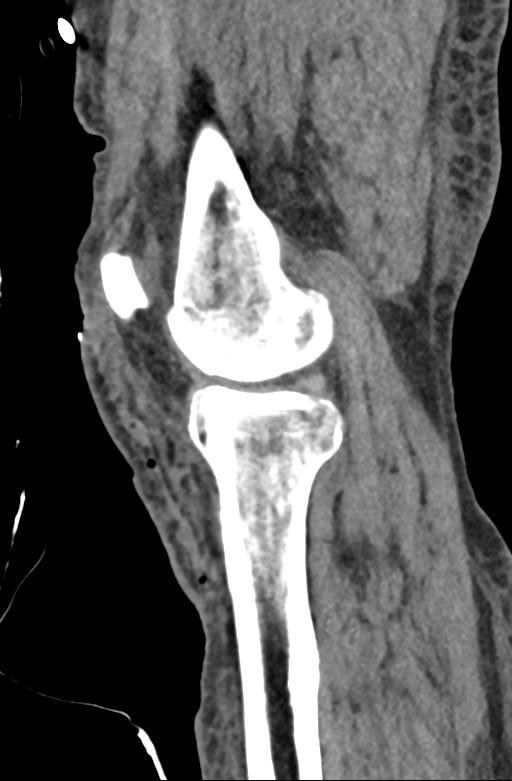
[im 50/100  bone]
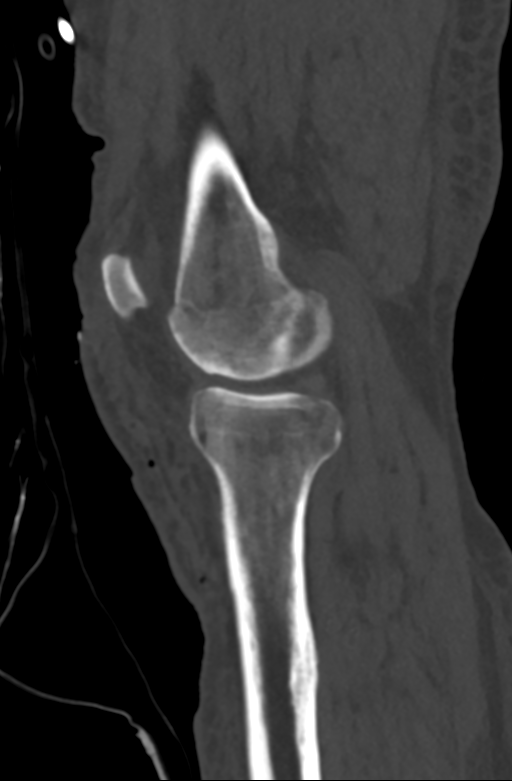
[im 58/100  bone]
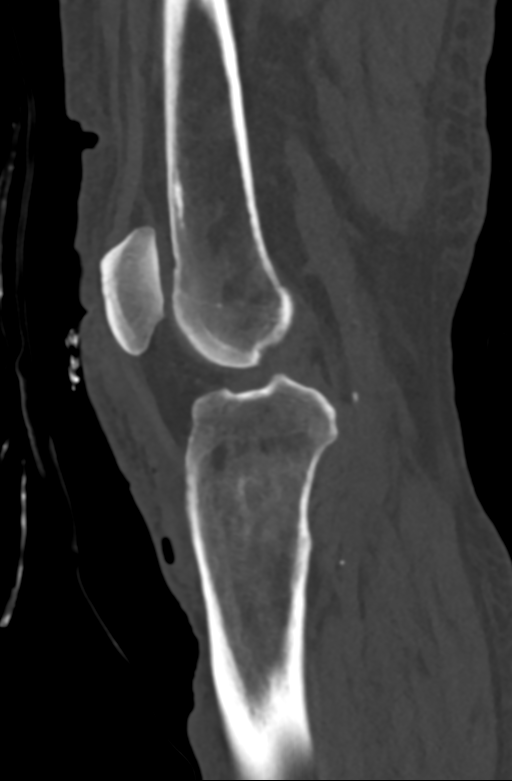
[im 67/100  bone]
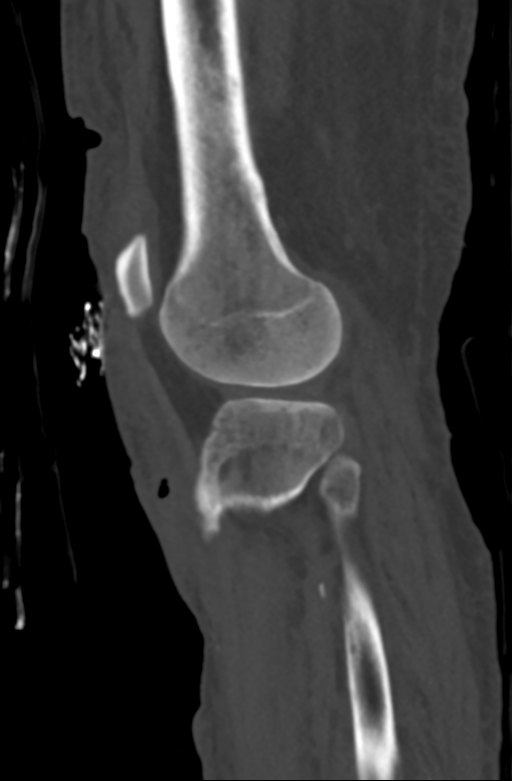

[13 of 33 positions shown; findings below may reference images not displayed]

FINDINGS: Bones/Joint/Cartilage

No definite fracture or dislocation is seen. There is mild
tricompartmental osteoarthritis, most notable in the medial
compartment. A tiny amount of suprapatellar fluid is seen.

Ligaments

Suboptimally assessed by CT. There does appear to be however
thickening and edema seen around the lateral patellar retinaculum.

Muscles and Tendons

The muscles appear to be grossly intact. No focal atrophy or tear.
The patellar and quadriceps tendon are intact.

Soft tissues

Significant overlying subcutaneous edema soft tissue swelling and
small foci of subcutaneous emphysema are noted. Along the lateral
patellar retinaculum there is a approximately 3 cm soft tissue
hematoma. And along the proximal medial tibia there is a small 2 cm
soft tissue hematoma. There is also overlying skin laceration seen.
Scattered vascular calcifications are noted.
IMPRESSION: No definite fracture seen.

Edema and thickening seen around the lateral patellar retinaculum,
likely due to intrasubstance sprain.

Overlying prepatellar laceration with significant subcutaneous edema
and small soft tissue hematomas as described above.

## 2021-09-15 IMAGING — CT CT HEAD W/O CM
4 series · 17 of 47 positions shown, 19 images · non-contrast
Comparison: Head CT Charielys Zierenberg 06/15/2007.

CLINICAL DATA: 68-year-old male with multiple falls today. Positive
loss of consciousness.

EXAM:
CT HEAD WITHOUT CONTRAST
TECHNIQUE: Contiguous axial images were obtained from the base of the skull
through the vertex without intravenous contrast.

[Series 3: head wo · axial · 0.43mm/px · z∈[-106,+24]mm · 7 of 36 slices shown, 9 images]
[im 5/36  brain]
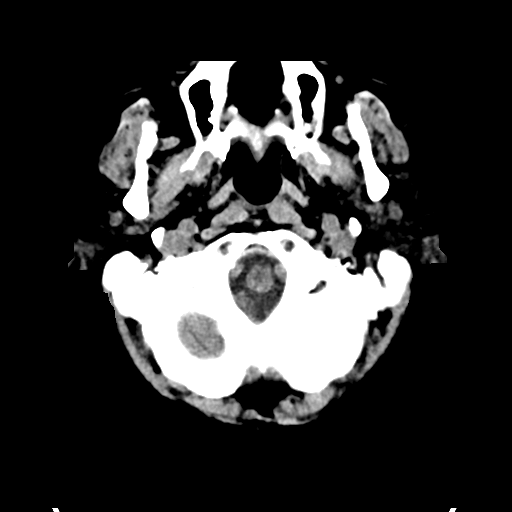
[im 5/36  bone]
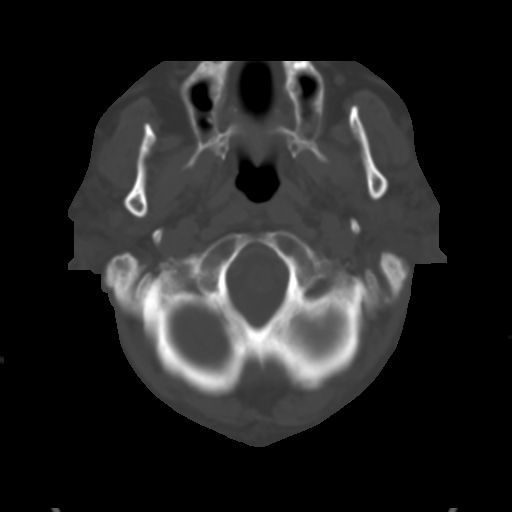
[im 9/36  brain]
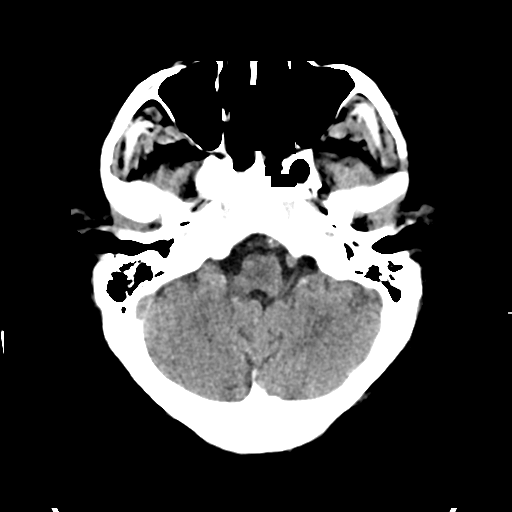
[im 14/36  brain]
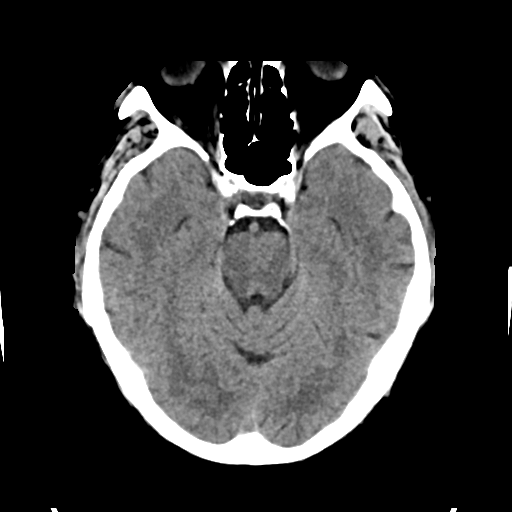
[im 18/36  brain]
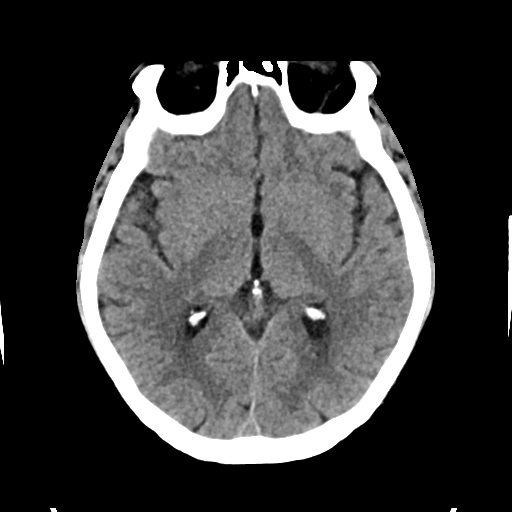
[im 22/36  brain]
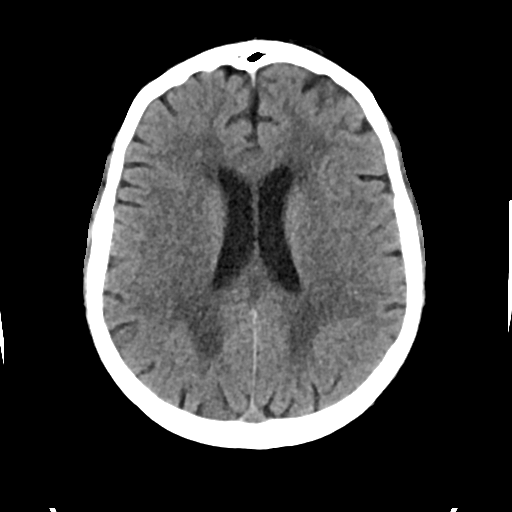
[im 22/36  bone]
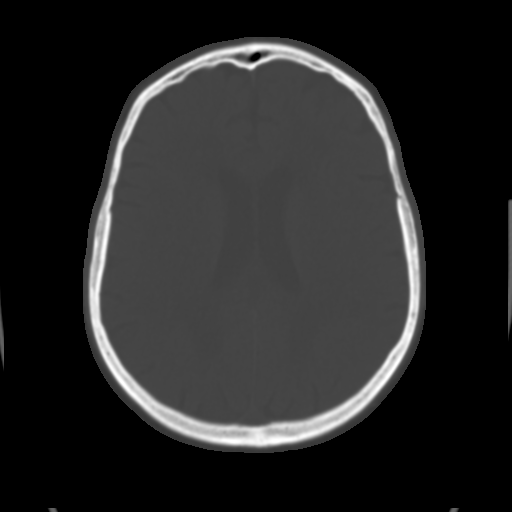
[im 27/36  brain]
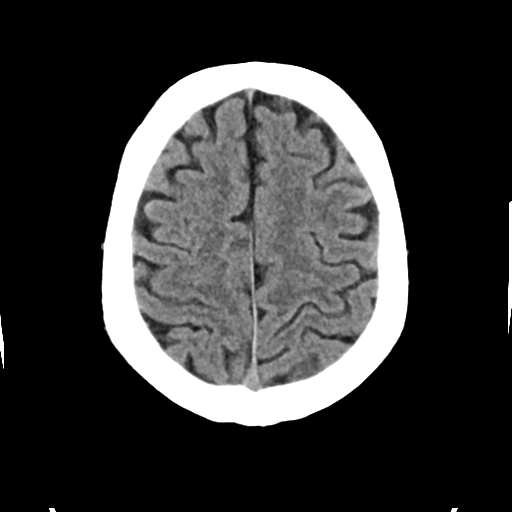
[im 31/36  brain]
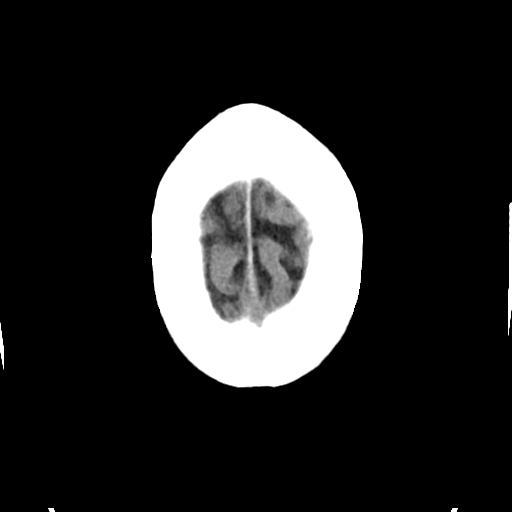

[Series 4: head bone · axial · 0.43mm/px · z∈[-110,-48]mm · 4 of 89 slices shown]
[im 9/89  bone]
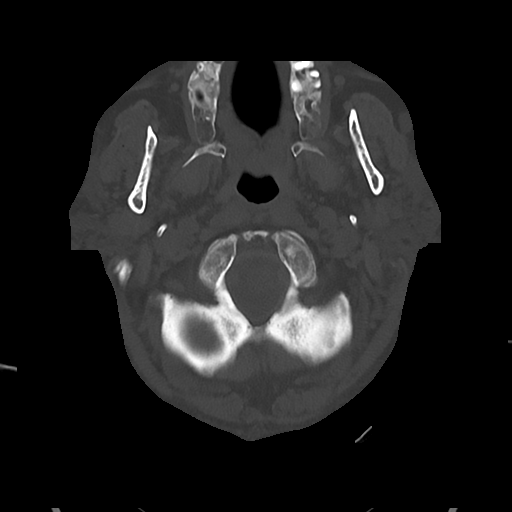
[im 18/89  bone]
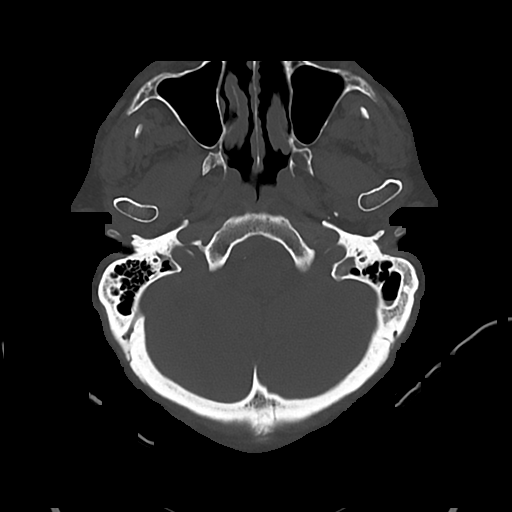
[im 27/89  bone]
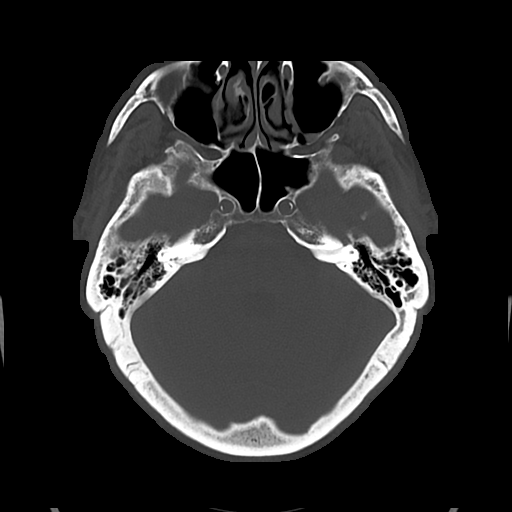
[im 40/89  bone]
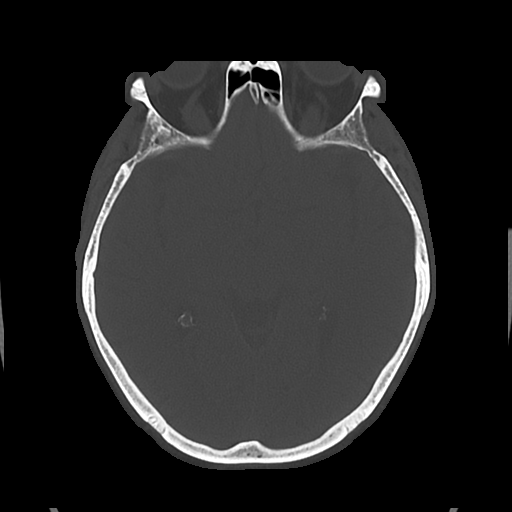

[Series 5: cor soft · coronal · 0.41mm/px · 3 of 74 slices shown]
[im 25/74  brain]
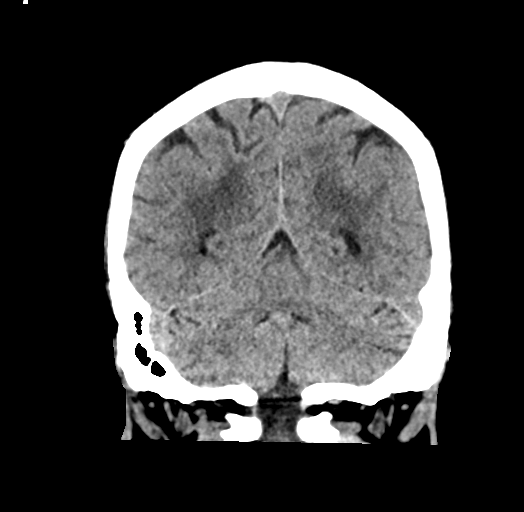
[im 33/74  brain]
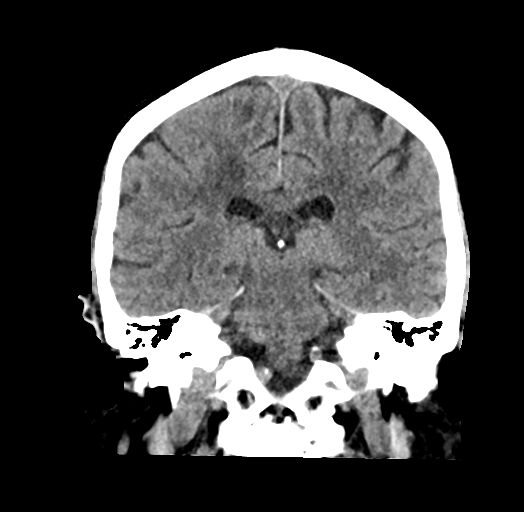
[im 41/74  brain]
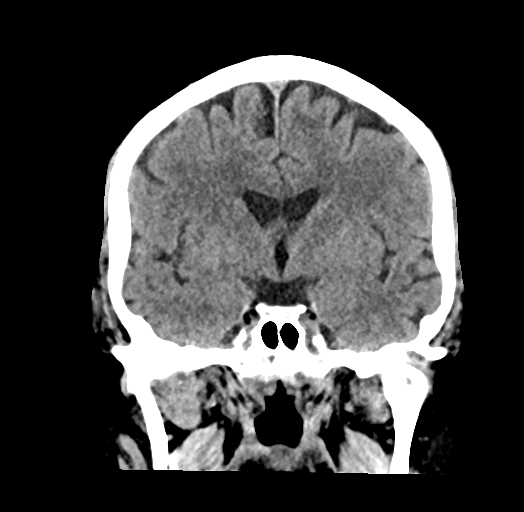

[Series 6: sag soft · sagittal · 0.44mm/px · 3 of 67 slices shown]
[im 23/67  brain]
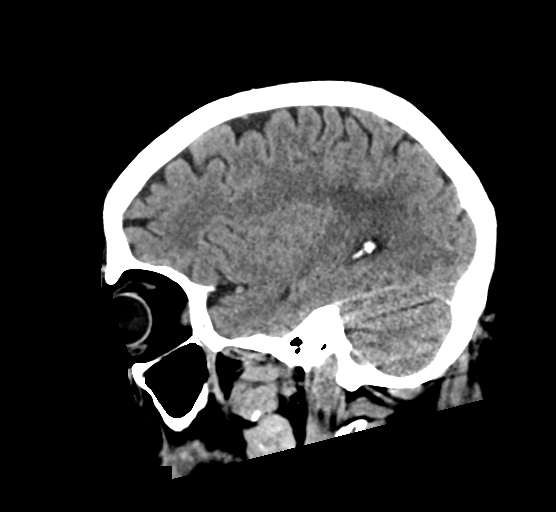
[im 34/67  brain]
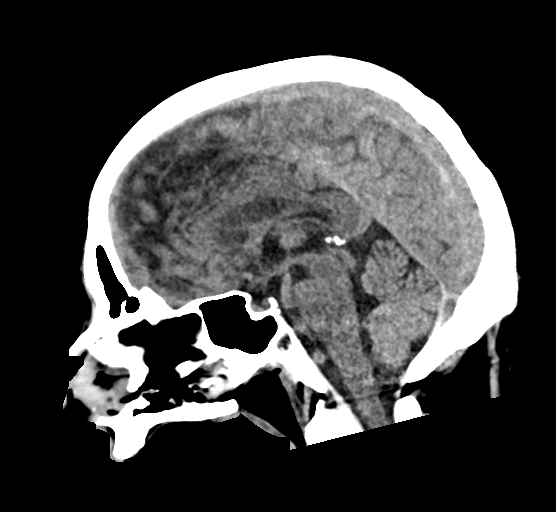
[im 45/67  brain]
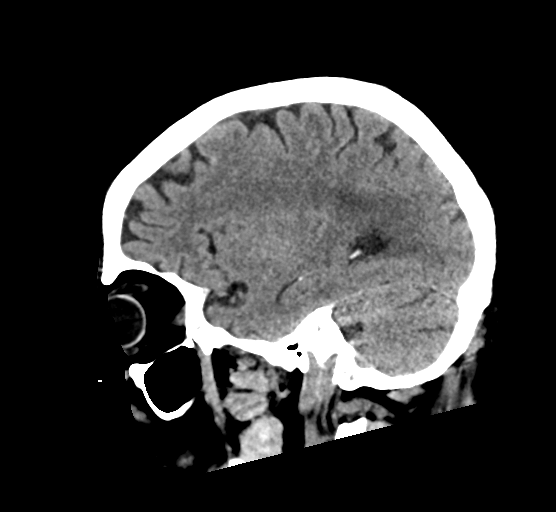

[17 of 47 positions shown; findings below may reference images not displayed]

FINDINGS: Brain: Cerebral volume is within normal limits for age. No midline
shift, ventriculomegaly, mass effect, evidence of mass lesion,
intracranial hemorrhage or evidence of cortically based acute
infarction. Patchy and confluent bilateral cerebral white matter
hypodensity is new since 9334, although fairly symmetric. The deep
gray nuclei, brainstem and cerebellum remain within normal limits.
No cortical encephalomalacia identified.

Vascular: Calcified atherosclerosis at the skull base.No suspicious
intracranial vascular hyperdensity.

Skull: Intact, negative.

Sinuses/Orbits: Visualized paranasal sinuses and mastoids are clear.

Other: No acute orbit or scalp soft tissue findings. Postoperative
changes to both globes.
IMPRESSION: 1. No acute intracranial abnormality or acute traumatic injury
identified.
2. Bilateral cerebral white matter disease has developed since 9334,
most commonly due to chronic small vessel ischemia.

## 2021-09-17 IMAGING — DX DG CHEST 1V PORT
1 series · 1 of 1 positions shown · non-contrast
Comparison: September 16, 2019

CLINICAL DATA: Pleural effusion follow-up.

EXAM:
PORTABLE CHEST 1 VIEW

[chest ap]
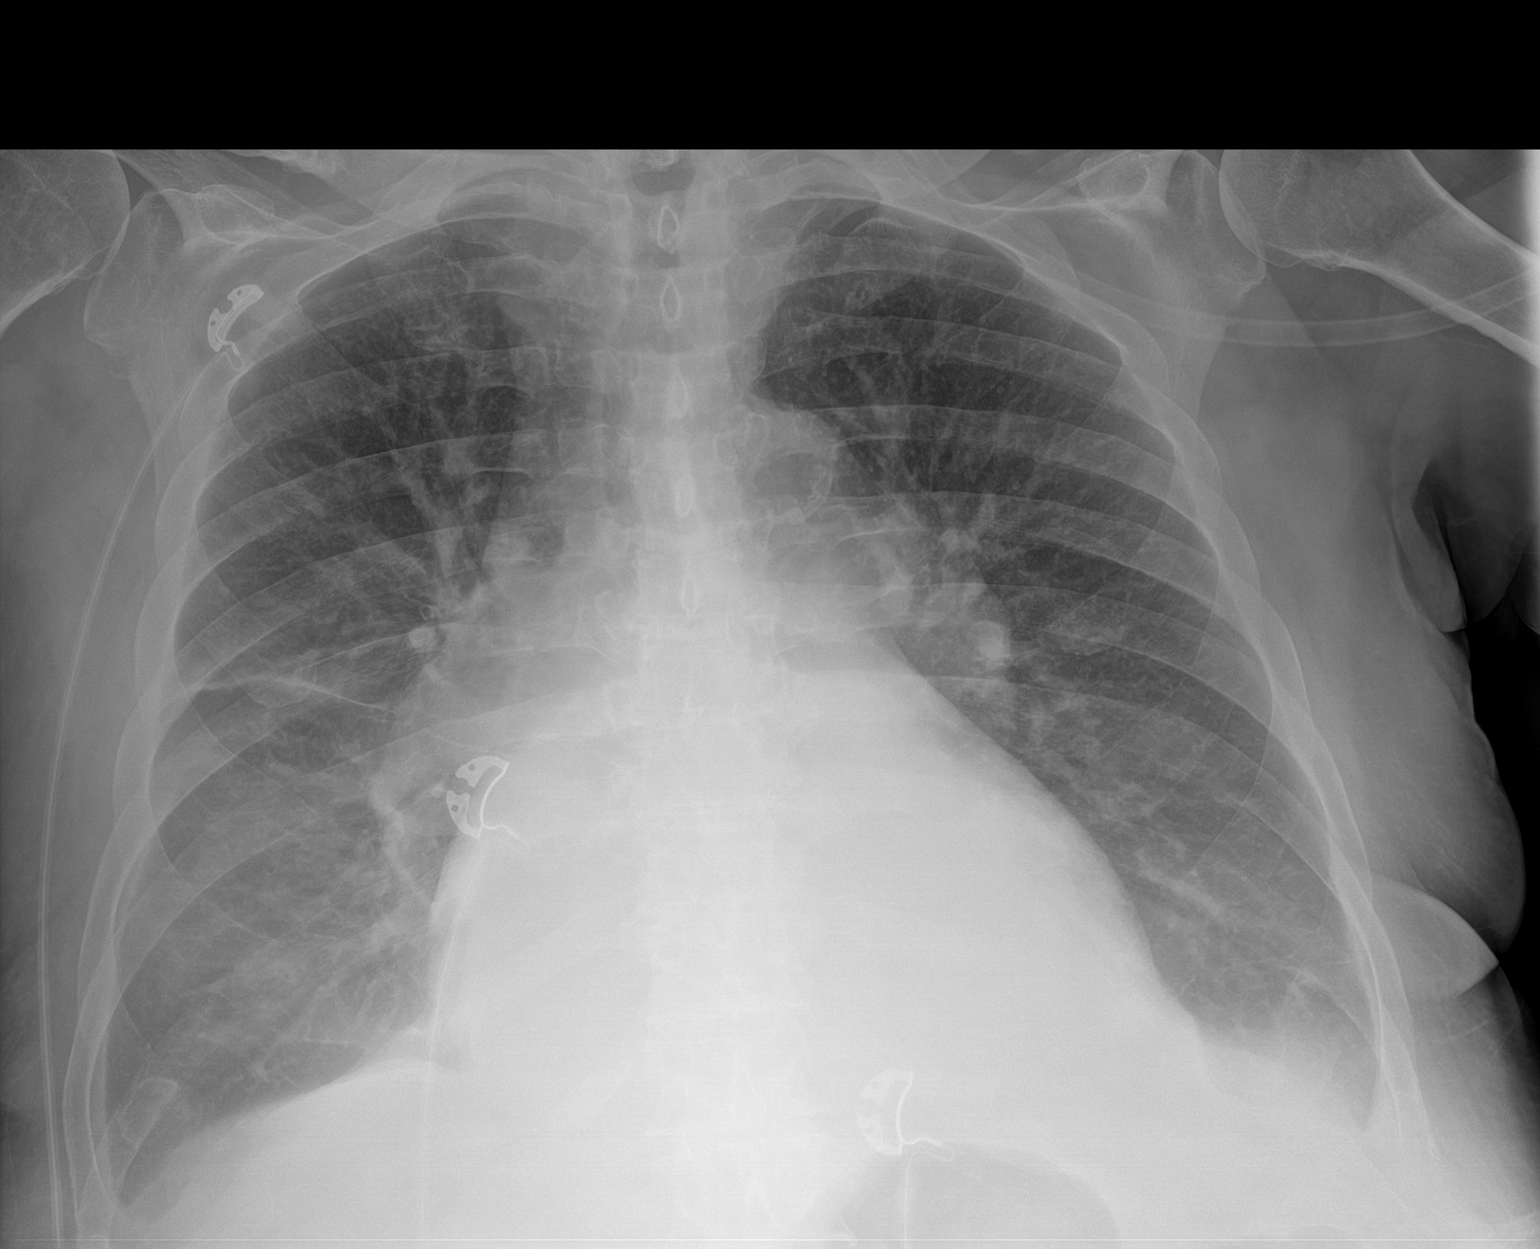

[1 of 1 positions shown; findings below may reference images not displayed]

FINDINGS: Cardiomegaly is stable. The hila and mediastinum are normal. Small
bilateral effusions with underlying atelectasis. Increased
interstitial markings consistent with mild edema, increased in the
interval. No other acute abnormalities.
IMPRESSION: Cardiomegaly, small stable pleural effusions, mild increased
pulmonary edema.

## 2021-11-24 IMAGING — CR DG CHEST 2V
2 series · 2 of 2 positions shown · non-contrast
Comparison: 09/23/2019.

CLINICAL DATA: CHF.

EXAM:
CHEST - 2 VIEW

[chest pa]
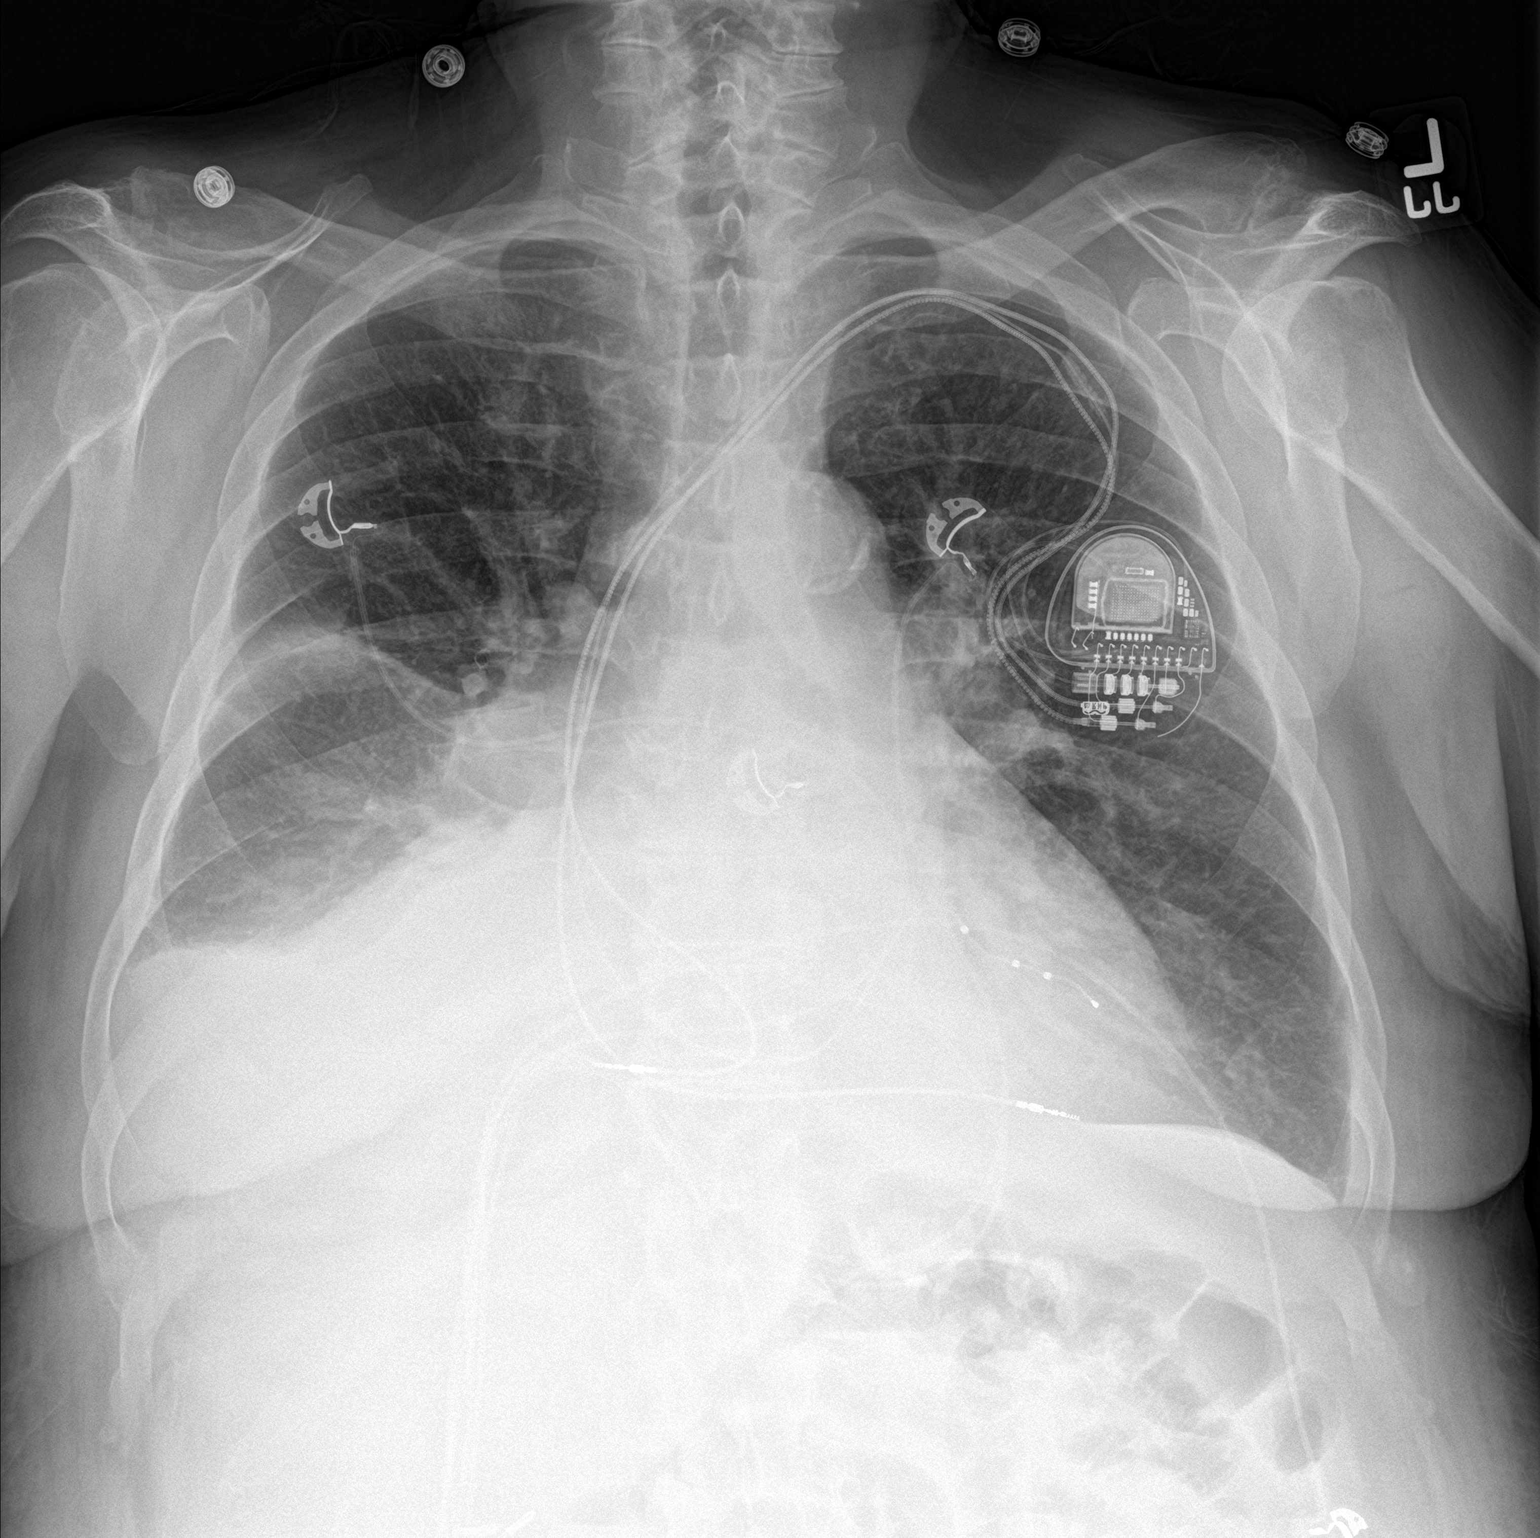

[chest lat]
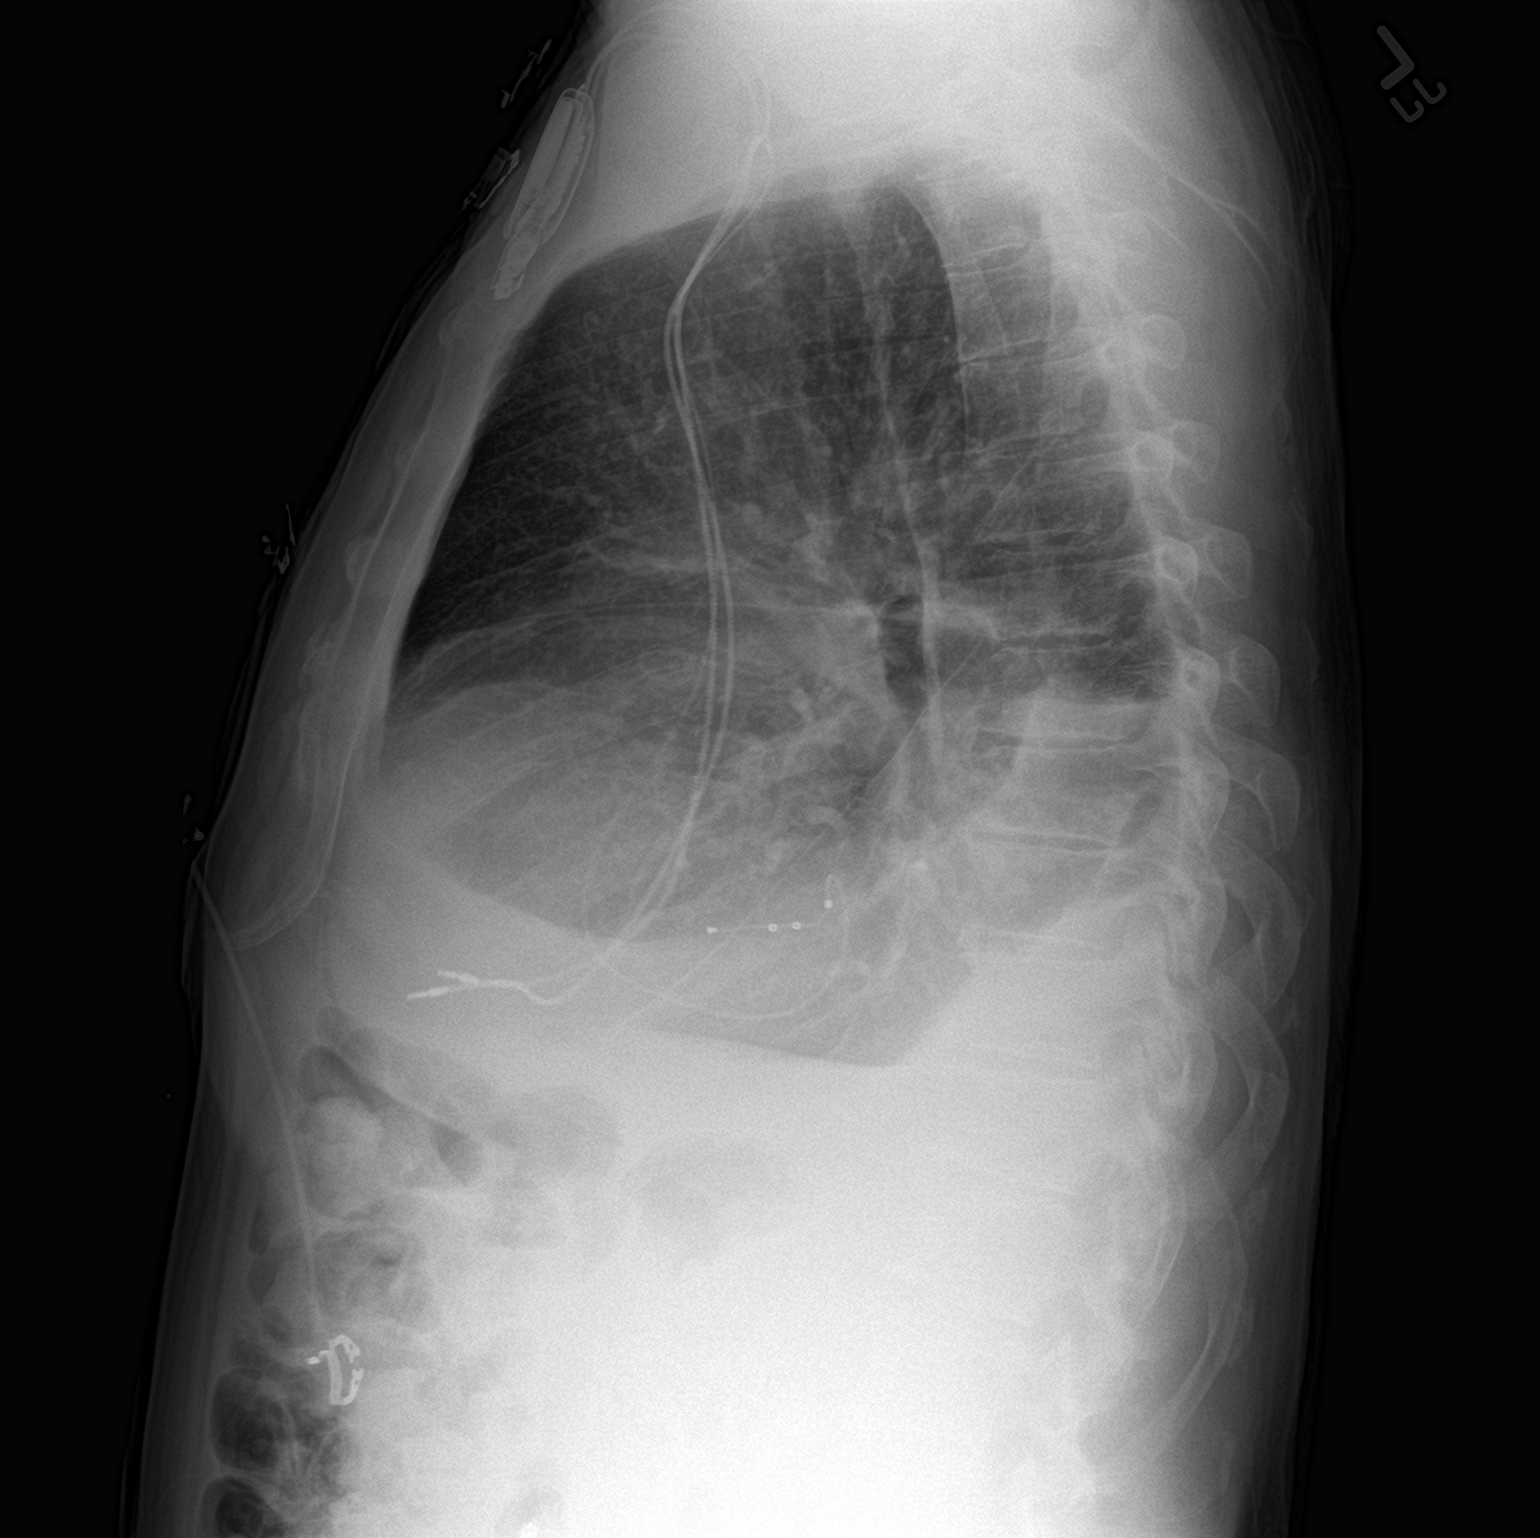

[2 of 2 positions shown; findings below may reference images not displayed]

FINDINGS: AICD noted stable position. Cardiomegaly with pulmonary venous
congestion and mild bilateral interstitial prominence. Moderate
right-sided pleural effusion. Findings suggest CHF. Right base
atelectasis. Right base infiltrate/pneumonia cannot be excluded. No
pneumothorax.
IMPRESSION: 1.  AICD in stable position.

2. Cardiomegaly with mild bilateral interstitial prominence.
Moderate right pleural effusion. Findings suggest CHF.

3. Right base atelectasis. Right base infiltrate/pneumonia cannot be
excluded.
